# Patient Record
Sex: Female | Born: 1935
Health system: Southern US, Community
[De-identification: ages and names within clinical notes are randomized; demographics above are authoritative.]

## PROBLEM LIST (undated history)

## (undated) DIAGNOSIS — I10 Essential (primary) hypertension: Secondary | ICD-10-CM

## (undated) DIAGNOSIS — Z923 Personal history of irradiation: Secondary | ICD-10-CM

## (undated) DIAGNOSIS — R7303 Prediabetes: Secondary | ICD-10-CM

## (undated) DIAGNOSIS — Z5189 Encounter for other specified aftercare: Secondary | ICD-10-CM

## (undated) DIAGNOSIS — I499 Cardiac arrhythmia, unspecified: Secondary | ICD-10-CM

## (undated) DIAGNOSIS — C801 Malignant (primary) neoplasm, unspecified: Secondary | ICD-10-CM

## (undated) DIAGNOSIS — IMO0001 Reserved for inherently not codable concepts without codable children: Secondary | ICD-10-CM

## (undated) DIAGNOSIS — I509 Heart failure, unspecified: Secondary | ICD-10-CM

## (undated) DIAGNOSIS — M1990 Unspecified osteoarthritis, unspecified site: Secondary | ICD-10-CM

## (undated) DIAGNOSIS — N6320 Unspecified lump in the left breast, unspecified quadrant: Secondary | ICD-10-CM

## (undated) DIAGNOSIS — E039 Hypothyroidism, unspecified: Secondary | ICD-10-CM

## (undated) HISTORY — PX: CATARACT EXTRACTION, BILATERAL: SHX1313

## (undated) HISTORY — PX: ABDOMINAL SURGERY: SHX537

## (undated) HISTORY — PX: JOINT REPLACEMENT: SHX530

## (undated) HISTORY — DX: Personal history of irradiation: Z92.3

## (undated) HISTORY — PX: EYE SURGERY: SHX253

## (undated) HISTORY — PX: ABDOMINAL HYSTERECTOMY: SHX81

---

## 1998-04-11 ENCOUNTER — Ambulatory Visit (HOSPITAL_COMMUNITY): Admission: RE | Admit: 1998-04-11 | Discharge: 1998-04-11 | Payer: Self-pay | Admitting: *Deleted

## 1998-05-09 ENCOUNTER — Ambulatory Visit (HOSPITAL_COMMUNITY): Admission: RE | Admit: 1998-05-09 | Discharge: 1998-05-09 | Payer: Self-pay | Admitting: *Deleted

## 2000-07-06 ENCOUNTER — Encounter: Payer: Self-pay | Admitting: *Deleted

## 2000-07-06 ENCOUNTER — Ambulatory Visit (HOSPITAL_COMMUNITY): Admission: RE | Admit: 2000-07-06 | Discharge: 2000-07-06 | Payer: Self-pay | Admitting: *Deleted

## 2000-07-21 ENCOUNTER — Encounter: Payer: Self-pay | Admitting: *Deleted

## 2000-07-21 ENCOUNTER — Ambulatory Visit (HOSPITAL_COMMUNITY): Admission: RE | Admit: 2000-07-21 | Discharge: 2000-07-21 | Payer: Self-pay | Admitting: *Deleted

## 2001-06-28 ENCOUNTER — Encounter: Payer: Self-pay | Admitting: Internal Medicine

## 2001-06-28 ENCOUNTER — Ambulatory Visit (HOSPITAL_COMMUNITY): Admission: RE | Admit: 2001-06-28 | Discharge: 2001-06-28 | Payer: Self-pay | Admitting: Internal Medicine

## 2002-03-01 ENCOUNTER — Encounter (INDEPENDENT_AMBULATORY_CARE_PROVIDER_SITE_OTHER): Payer: Self-pay | Admitting: Specialist

## 2002-03-01 ENCOUNTER — Ambulatory Visit (HOSPITAL_COMMUNITY): Admission: RE | Admit: 2002-03-01 | Discharge: 2002-03-01 | Payer: Self-pay | Admitting: Gastroenterology

## 2004-03-13 ENCOUNTER — Ambulatory Visit (HOSPITAL_COMMUNITY): Admission: RE | Admit: 2004-03-13 | Discharge: 2004-03-13 | Payer: Self-pay | Admitting: Gastroenterology

## 2004-03-13 ENCOUNTER — Encounter (INDEPENDENT_AMBULATORY_CARE_PROVIDER_SITE_OTHER): Payer: Self-pay | Admitting: *Deleted

## 2004-10-11 ENCOUNTER — Encounter: Admission: RE | Admit: 2004-10-11 | Discharge: 2004-10-11 | Payer: Self-pay | Admitting: Internal Medicine

## 2004-10-16 ENCOUNTER — Encounter: Admission: RE | Admit: 2004-10-16 | Discharge: 2004-10-16 | Payer: Self-pay | Admitting: Internal Medicine

## 2004-11-05 ENCOUNTER — Encounter: Admission: RE | Admit: 2004-11-05 | Discharge: 2004-11-05 | Payer: Self-pay | Admitting: General Surgery

## 2005-04-25 ENCOUNTER — Encounter: Admission: RE | Admit: 2005-04-25 | Discharge: 2005-04-25 | Payer: Self-pay | Admitting: Obstetrics and Gynecology

## 2005-07-31 ENCOUNTER — Encounter: Admission: RE | Admit: 2005-07-31 | Discharge: 2005-07-31 | Payer: Self-pay | Admitting: Internal Medicine

## 2005-08-11 ENCOUNTER — Ambulatory Visit: Payer: Self-pay | Admitting: Critical Care Medicine

## 2005-11-11 ENCOUNTER — Encounter: Admission: RE | Admit: 2005-11-11 | Discharge: 2005-11-11 | Payer: Self-pay | Admitting: Internal Medicine

## 2005-12-01 ENCOUNTER — Ambulatory Visit: Payer: Self-pay | Admitting: Internal Medicine

## 2006-06-03 ENCOUNTER — Ambulatory Visit: Payer: Self-pay | Admitting: Cardiology

## 2006-07-29 ENCOUNTER — Emergency Department (HOSPITAL_COMMUNITY): Admission: EM | Admit: 2006-07-29 | Discharge: 2006-07-29 | Payer: Self-pay | Admitting: Emergency Medicine

## 2006-11-13 ENCOUNTER — Encounter: Admission: RE | Admit: 2006-11-13 | Discharge: 2006-11-13 | Payer: Self-pay | Admitting: Obstetrics and Gynecology

## 2007-06-07 ENCOUNTER — Ambulatory Visit: Payer: Self-pay | Admitting: Cardiology

## 2007-11-15 ENCOUNTER — Encounter: Admission: RE | Admit: 2007-11-15 | Discharge: 2007-11-15 | Payer: Self-pay | Admitting: Obstetrics and Gynecology

## 2008-05-11 ENCOUNTER — Encounter: Admission: RE | Admit: 2008-05-11 | Discharge: 2008-05-11 | Payer: Self-pay | Admitting: Family Medicine

## 2008-11-15 ENCOUNTER — Encounter: Admission: RE | Admit: 2008-11-15 | Discharge: 2008-11-15 | Payer: Self-pay | Admitting: Obstetrics and Gynecology

## 2008-11-27 ENCOUNTER — Encounter: Admission: RE | Admit: 2008-11-27 | Discharge: 2008-11-27 | Payer: Self-pay | Admitting: Obstetrics and Gynecology

## 2009-11-14 ENCOUNTER — Ambulatory Visit: Payer: Self-pay | Admitting: Vascular Surgery

## 2009-11-14 ENCOUNTER — Encounter (INDEPENDENT_AMBULATORY_CARE_PROVIDER_SITE_OTHER): Payer: Self-pay | Admitting: Podiatry

## 2009-11-14 ENCOUNTER — Ambulatory Visit (HOSPITAL_COMMUNITY): Admission: RE | Admit: 2009-11-14 | Discharge: 2009-11-14 | Payer: Self-pay | Admitting: Podiatry

## 2009-11-17 ENCOUNTER — Encounter: Admission: RE | Admit: 2009-11-17 | Discharge: 2009-11-17 | Payer: Self-pay | Admitting: Family Medicine

## 2009-11-21 ENCOUNTER — Encounter: Admission: RE | Admit: 2009-11-21 | Discharge: 2009-11-21 | Payer: Self-pay | Admitting: Family Medicine

## 2010-01-01 ENCOUNTER — Encounter: Admission: RE | Admit: 2010-01-01 | Discharge: 2010-01-01 | Payer: Self-pay | Admitting: Family Medicine

## 2010-07-08 ENCOUNTER — Encounter
Admission: RE | Admit: 2010-07-08 | Discharge: 2010-07-08 | Payer: Self-pay | Admitting: Physical Medicine and Rehabilitation

## 2010-11-10 ENCOUNTER — Encounter: Payer: Self-pay | Admitting: Obstetrics and Gynecology

## 2010-12-23 ENCOUNTER — Other Ambulatory Visit: Payer: Self-pay | Admitting: Obstetrics and Gynecology

## 2010-12-23 DIAGNOSIS — Z1231 Encounter for screening mammogram for malignant neoplasm of breast: Secondary | ICD-10-CM

## 2011-01-08 ENCOUNTER — Ambulatory Visit
Admission: RE | Admit: 2011-01-08 | Discharge: 2011-01-08 | Disposition: A | Discharge status: Home / Self Care | Payer: Medicare Other | Source: Ambulatory Visit | Attending: Obstetrics and Gynecology | Admitting: Obstetrics and Gynecology

## 2011-01-08 DIAGNOSIS — Z1231 Encounter for screening mammogram for malignant neoplasm of breast: Secondary | ICD-10-CM

## 2011-01-22 ENCOUNTER — Other Ambulatory Visit (HOSPITAL_COMMUNITY): Payer: Medicare Other

## 2011-01-27 ENCOUNTER — Encounter (HOSPITAL_COMMUNITY)
Admission: RE | Admit: 2011-01-27 | Discharge: 2011-01-27 | Disposition: A | Discharge status: Home / Self Care | Payer: Medicare Other | Source: Ambulatory Visit | Attending: Orthopedic Surgery | Admitting: Orthopedic Surgery

## 2011-01-27 ENCOUNTER — Other Ambulatory Visit: Payer: Self-pay | Admitting: Orthopedic Surgery

## 2011-01-27 ENCOUNTER — Ambulatory Visit (HOSPITAL_COMMUNITY)
Admission: RE | Admit: 2011-01-27 | Discharge: 2011-01-27 | Disposition: A | Discharge status: Home / Self Care | Payer: Medicare Other | Source: Ambulatory Visit | Attending: Orthopedic Surgery | Admitting: Orthopedic Surgery

## 2011-01-27 DIAGNOSIS — M1710 Unilateral primary osteoarthritis, unspecified knee: Secondary | ICD-10-CM | POA: Insufficient documentation

## 2011-01-27 DIAGNOSIS — Z01818 Encounter for other preprocedural examination: Secondary | ICD-10-CM | POA: Insufficient documentation

## 2011-01-27 DIAGNOSIS — M25562 Pain in left knee: Secondary | ICD-10-CM

## 2011-01-27 DIAGNOSIS — Z01812 Encounter for preprocedural laboratory examination: Secondary | ICD-10-CM | POA: Insufficient documentation

## 2011-01-27 DIAGNOSIS — M1712 Unilateral primary osteoarthritis, left knee: Secondary | ICD-10-CM

## 2011-01-27 DIAGNOSIS — IMO0002 Reserved for concepts with insufficient information to code with codable children: Secondary | ICD-10-CM | POA: Insufficient documentation

## 2011-01-27 DIAGNOSIS — J45909 Unspecified asthma, uncomplicated: Secondary | ICD-10-CM | POA: Insufficient documentation

## 2011-01-27 DIAGNOSIS — Z01810 Encounter for preprocedural cardiovascular examination: Secondary | ICD-10-CM | POA: Insufficient documentation

## 2011-01-27 DIAGNOSIS — I517 Cardiomegaly: Secondary | ICD-10-CM | POA: Insufficient documentation

## 2011-01-27 LAB — CBC
HCT: 39.5 % (ref 36.0–46.0)
Hemoglobin: 12.8 g/dL (ref 12.0–15.0)
MCH: 30.3 pg (ref 26.0–34.0)
MCHC: 32.4 g/dL (ref 30.0–36.0)
MCV: 93.4 fL (ref 78.0–100.0)
Platelets: 322 10*3/uL (ref 150–400)
RBC: 4.23 MIL/uL (ref 3.87–5.11)
RDW: 13.4 % (ref 11.5–15.5)
WBC: 10.8 10*3/uL — ABNORMAL HIGH (ref 4.0–10.5)

## 2011-01-27 LAB — URINALYSIS, ROUTINE W REFLEX MICROSCOPIC
Bilirubin Urine: NEGATIVE
Glucose, UA: NEGATIVE mg/dL
Hgb urine dipstick: NEGATIVE
Ketones, ur: NEGATIVE mg/dL
Nitrite: NEGATIVE
Protein, ur: NEGATIVE mg/dL
Specific Gravity, Urine: 1.01 (ref 1.005–1.030)
Urobilinogen, UA: 0.2 mg/dL (ref 0.0–1.0)
pH: 7.5 (ref 5.0–8.0)

## 2011-01-27 LAB — DIFFERENTIAL
Basophils Absolute: 0.1 10*3/uL (ref 0.0–0.1)
Basophils Relative: 1 % (ref 0–1)
Eosinophils Absolute: 0.7 10*3/uL (ref 0.0–0.7)
Eosinophils Relative: 6 % — ABNORMAL HIGH (ref 0–5)
Lymphocytes Relative: 20 % (ref 12–46)
Lymphs Abs: 2.2 10*3/uL (ref 0.7–4.0)
Monocytes Absolute: 0.9 10*3/uL (ref 0.1–1.0)
Monocytes Relative: 8 % (ref 3–12)
Neutro Abs: 7 10*3/uL (ref 1.7–7.7)
Neutrophils Relative %: 65 % (ref 43–77)

## 2011-01-27 LAB — TYPE AND SCREEN
ABO/RH(D): O POS
Antibody Screen: NEGATIVE

## 2011-01-27 LAB — BASIC METABOLIC PANEL
BUN: 13 mg/dL (ref 6–23)
CO2: 28 mEq/L (ref 19–32)
Calcium: 9.5 mg/dL (ref 8.4–10.5)
Chloride: 104 mEq/L (ref 96–112)
Creatinine, Ser: 0.77 mg/dL (ref 0.4–1.2)
GFR calc Af Amer: >60 mL/min (ref >60)
GFR calc non Af Amer: >60 mL/min (ref >60)
Glucose, Bld: 99 mg/dL (ref 70–99)
Potassium: 4 mEq/L (ref 3.5–5.1)
Sodium: 142 mEq/L (ref 135–145)

## 2011-01-27 LAB — ABO/RH: ABO/RH(D): O POS

## 2011-01-27 LAB — SURGICAL PCR SCREEN
MRSA, PCR: NEGATIVE
Staphylococcus aureus: NEGATIVE

## 2011-01-27 LAB — PROTIME-INR
INR: 0.97 (ref 0.00–1.49)
Prothrombin Time: 13.1 seconds (ref 11.6–15.2)

## 2011-01-28 ENCOUNTER — Inpatient Hospital Stay (HOSPITAL_COMMUNITY)
Admission: RE | Admit: 2011-01-28 | Discharge: 2011-02-01 | DRG: 470 | Disposition: A | Discharge status: Home Health | Payer: Medicare Other | Source: Ambulatory Visit | Attending: Orthopedic Surgery | Admitting: Orthopedic Surgery

## 2011-01-28 ENCOUNTER — Inpatient Hospital Stay (HOSPITAL_COMMUNITY): Payer: Medicare Other

## 2011-01-28 DIAGNOSIS — J45909 Unspecified asthma, uncomplicated: Secondary | ICD-10-CM | POA: Diagnosis present

## 2011-01-28 DIAGNOSIS — I10 Essential (primary) hypertension: Secondary | ICD-10-CM | POA: Diagnosis present

## 2011-01-28 DIAGNOSIS — Z880 Allergy status to penicillin: Secondary | ICD-10-CM

## 2011-01-28 DIAGNOSIS — Z7982 Long term (current) use of aspirin: Secondary | ICD-10-CM

## 2011-01-28 DIAGNOSIS — Z7901 Long term (current) use of anticoagulants: Secondary | ICD-10-CM

## 2011-01-28 DIAGNOSIS — Z882 Allergy status to sulfonamides status: Secondary | ICD-10-CM

## 2011-01-28 DIAGNOSIS — M1710 Unilateral primary osteoarthritis, unspecified knee: Principal | ICD-10-CM | POA: Diagnosis present

## 2011-01-28 LAB — URINALYSIS, DIPSTICK ONLY
Bilirubin Urine: NEGATIVE
Glucose, UA: NEGATIVE mg/dL
Hgb urine dipstick: NEGATIVE
Ketones, ur: NEGATIVE mg/dL
Leukocytes, UA: NEGATIVE
Nitrite: NEGATIVE
Protein, ur: NEGATIVE mg/dL
Specific Gravity, Urine: 1.029 (ref 1.005–1.030)
Urobilinogen, UA: 0.2 mg/dL (ref 0.0–1.0)
pH: 5 (ref 5.0–8.0)

## 2011-01-29 LAB — CBC
HCT: 29.6 % — ABNORMAL LOW (ref 36.0–46.0)
Hemoglobin: 9.5 g/dL — ABNORMAL LOW (ref 12.0–15.0)
MCH: 30.2 pg (ref 26.0–34.0)
MCHC: 32.1 g/dL (ref 30.0–36.0)
MCV: 94 fL (ref 78.0–100.0)
Platelets: 248 10*3/uL (ref 150–400)
RBC: 3.15 MIL/uL — ABNORMAL LOW (ref 3.87–5.11)
RDW: 13.6 % (ref 11.5–15.5)
WBC: 11.1 10*3/uL — ABNORMAL HIGH (ref 4.0–10.5)

## 2011-01-29 LAB — URINE CULTURE
Colony Count: NO GROWTH
Culture  Setup Time: 201204101617
Culture: NO GROWTH
Special Requests: NEGATIVE

## 2011-01-29 LAB — PROTIME-INR
INR: 1.05 (ref 0.00–1.49)
Prothrombin Time: 13.9 seconds (ref 11.6–15.2)

## 2011-01-29 LAB — BASIC METABOLIC PANEL
BUN: 9 mg/dL (ref 6–23)
CO2: 27 mEq/L (ref 19–32)
Calcium: 8.3 mg/dL — ABNORMAL LOW (ref 8.4–10.5)
Chloride: 107 mEq/L (ref 96–112)
Creatinine, Ser: 0.7 mg/dL (ref 0.4–1.2)
GFR calc Af Amer: >60 mL/min (ref >60)
GFR calc non Af Amer: >60 mL/min (ref >60)
Glucose, Bld: 113 mg/dL — ABNORMAL HIGH (ref 70–99)
Potassium: 4.1 mEq/L (ref 3.5–5.1)
Sodium: 142 mEq/L (ref 135–145)

## 2011-01-30 LAB — BASIC METABOLIC PANEL
BUN: 6 mg/dL (ref 6–23)
CO2: 26 mEq/L (ref 19–32)
Calcium: 8.2 mg/dL — ABNORMAL LOW (ref 8.4–10.5)
Chloride: 105 mEq/L (ref 96–112)
Creatinine, Ser: 0.68 mg/dL (ref 0.4–1.2)
GFR calc Af Amer: >60 mL/min (ref >60)
GFR calc non Af Amer: >60 mL/min (ref >60)
Glucose, Bld: 108 mg/dL — ABNORMAL HIGH (ref 70–99)
Potassium: 3.4 mEq/L — ABNORMAL LOW (ref 3.5–5.1)
Sodium: 140 mEq/L (ref 135–145)

## 2011-01-30 LAB — CBC
HCT: 28.1 % — ABNORMAL LOW (ref 36.0–46.0)
Hemoglobin: 9.2 g/dL — ABNORMAL LOW (ref 12.0–15.0)
MCH: 30.4 pg (ref 26.0–34.0)
MCHC: 32.7 g/dL (ref 30.0–36.0)
MCV: 92.7 fL (ref 78.0–100.0)
Platelets: 237 10*3/uL (ref 150–400)
RBC: 3.03 MIL/uL — ABNORMAL LOW (ref 3.87–5.11)
RDW: 13.4 % (ref 11.5–15.5)
WBC: 13.2 10*3/uL — ABNORMAL HIGH (ref 4.0–10.5)

## 2011-01-30 LAB — PROTIME-INR
INR: 1.46 (ref 0.00–1.49)
Prothrombin Time: 17.9 seconds — ABNORMAL HIGH (ref 11.6–15.2)

## 2011-01-30 NOTE — Op Note (Signed)
Alisha [name], Alisha [name]              ACCOUNT NO.:  [id number]  MEDICAL RECORD NO.:  [id number]           PATIENT TYPE:  I  LOCATION:  5002                         FACILITY:  [facility]  PHYSICIAN:  Burnard Bunting, M.D.    DATE OF BIRTH:  09-12-1935  DATE OF PROCEDURE:  01/28/2011 DATE OF DISCHARGE:                              OPERATIVE REPORT   PREOPERATIVE DIAGNOSIS:  Left knee arthritis.  POSTOPERATIVE DIAGNOSIS:  Left knee arthritis.  PROCEDURE:  Left total knee replacement.  SURGEON:  Burnard Bunting, MD  ASSISTANT:  Wende Neighbors, PA  ANESTHESIA:  General endotracheal.  ESTIMATED BLOOD LOSS:  100 mL.  DRAINS:  Hemovac x1.  INDICATIONS:  Alisha [name] is a 76 year old patient with left knee arthritis presents for operative management for explanation of risks and benefits.  COMPONENTS INSERTED:  DePuy rotating platform 2.5 tibia, 3 femur, 12.5 poly, 35 true dome patella with posterior cruciate sacrificing instrumentation utilized, as well as the computer systems utilized.  PROCEDURE IN DETAIL:  The patient was brought to the operating room where general endotracheal anesthesia was induced.  Preoperative antibiotics were administered.  Left knee was prescribed with alcohol and Betadine, , which allowed to air dry and then prepped with DuraPrep solution and draped in sterile manner.  Alisha [name] was used to cover the operative field.  Time-out was called.  Leg was elevated and Esmarch wrap.  Tourniquet was inflated.  Anterior approach to the knee was utilized.  Skin and subcutaneous tissue were sharply divided.  Median parapatellar approach to the knee was used.  Precise location of the arthrotomy was marked with #1 Vicryl suture.  Patella was everted. Osteophytes were removed.  ACL, PCL were released.  Soft tissue from the anterior distal femur was removed.  Fat pad was partially excised. Lateral patellofemoral ligament was released.  Two pins were placed in the distal medial  femur, proximal medial tibia.  Registration points for computer assistance were obtained including the bimalleolar axis, and hip center, rotation, and various points around the knee.  Tibia was then cut with the collaterals and posterior structures well protected. At this cut, perpendicular mechanical axis, tensioning device was then placed in extension and flexion.  Distal cut was then made, and then extension was checked with some extension spacer block with a 10-mm equivalent spacer block.  The knee did achieve full extension in neutral alignment.  At this time, the chamfer and posterior cut, anterior cuts were made along with the box cut.  Tibia was then keel punched and sized to 2.5.  At this time, trial components were placed.  Patella was then cut from 25-14 and a 35-mm 3-peg patella was placed.  The trial components in position including 12.5 spacer, the patient achieved 0 degrees of extension, full flexion with excellent patellar tracking with no-thumbs technique, had good stability to varus and valgus stress at 0, 30, and 90 degrees, and that was with a 12.5 spacer.  Trial components were removed.  3 liters of irrigating solution were used to irrigate the knee incision.  The true components were then cemented into position. Excess cement was removed.  Same stability and alignment parameters were maintained.  Tourniquet was released.  Bleeding points encountered and controlled using electrocautery.  Thorough irrigation was again performed.  Hemovac drain was placed.  Median parapatellar arthrotomy was then closed with the knee over a bolster using #1 Vicryl suture, 0 Vicryl suture, 2-0 Vicryl suture, and staples.  A 3-0 nylon was used to close the incisions for the pin sites, which the pins were removed. Solution of Marcaine, morphine, and clonidine was injected into the knee.  The patient tolerated the procedure well without immediate complications.  Bulky wrap was applied.   Alisha [name]'s assistance was required all times during the case for opening, closing, retraction of important neurovascular structures removing excess cement.  Her assistance was a medical necessity.     Burnard Bunting, M.D.     GSD/MEDQ  D:  01/28/2011  T:  01/29/2011  Job:  [id number]  Electronically Signed by Reece Agar.  DEAN M.D. on 01/30/2011 05:37:18 PM

## 2011-01-31 LAB — CBC
HCT: 27.6 % — ABNORMAL LOW (ref 36.0–46.0)
Hemoglobin: 9 g/dL — ABNORMAL LOW (ref 12.0–15.0)
MCH: 30.3 pg (ref 26.0–34.0)
MCHC: 32.6 g/dL (ref 30.0–36.0)
MCV: 92.9 fL (ref 78.0–100.0)
Platelets: 256 10*3/uL (ref 150–400)
RBC: 2.97 MIL/uL — ABNORMAL LOW (ref 3.87–5.11)
RDW: 13.4 % (ref 11.5–15.5)
WBC: 13.6 10*3/uL — ABNORMAL HIGH (ref 4.0–10.5)

## 2011-01-31 LAB — PROTIME-INR
INR: 1.51 — ABNORMAL HIGH (ref 0.00–1.49)
Prothrombin Time: 18.4 seconds — ABNORMAL HIGH (ref 11.6–15.2)

## 2011-02-01 LAB — PROTIME-INR
INR: 1.91 — ABNORMAL HIGH (ref 0.00–1.49)
Prothrombin Time: 22 seconds — ABNORMAL HIGH (ref 11.6–15.2)

## 2011-02-25 NOTE — Discharge Summary (Signed)
  [name], Alisha [name]              ACCOUNT NO.:  [id number]  MEDICAL RECORD NO.:  [id number]           PATIENT TYPE:  I  LOCATION:  5002                         FACILITY:  [facility]  PHYSICIAN:  Burnard Bunting, M.D.    DATE OF BIRTH:  1935/05/09  DATE OF ADMISSION:  01/28/2011 DATE OF DISCHARGE:  02/01/2011                              DISCHARGE SUMMARY   DISCHARGE DIAGNOSIS:  Left knee arthritis.  SECONDARY DIAGNOSES: 1. Asthma. 2. Hypertension.  OPERATIONS AND NOTABLE PROCEDURES:  Left total knee replacement, performed on January 28, 2011.  HOSPITAL COURSE:  [name] is a patient with left knee arthritis.  She underwent total knee replacement on January 28, 2011.  She did well, was started on Coumadin for DVT prophylaxis, physical therapy, and CPM for range of motion.  Hemoglobin was 9.5 on postop day #1.  She made good progress with therapy.  Incision was intact on postop day #3 when the dressing was changed.  She was discharged home in good condition on postop day #4.  INR is near therapeutic on January 31, 2011, at 1.51.  She was discharged in good condition.  She will follow up with me in 7 days for suture removal.  DISCHARGE MEDICATIONS: 1. Robaxin 500 mg p.o. q.6-8 h. as needed for spasm. 2. Percocet 5/325 one to two p.o. q.6 h. as needed for pain. 3. Senna tablet one tablet by mouth twice daily before meals. 4. Coumadin 5 mg p.o. daily to INR of 2-2.5. 5. Advair Diskus 1 puff inhaled daily. 6. Aspirin enteric coated 81 mg p.o. daily. 7. Bumetanide 1 mg one to two tablets by mouth twice daily. 8. Gabapentin 300 mg by mouth daily. 9. Multivitamins. 10.Nitrofurantoin macrocrystal 50 mg by mouth daily one capsule. 11.ProAir albuterol inhaler 2 puffs inhaled every 4 hours as needed. 12.Synthroid 50 mcg one to two tablets by mouth daily. 13.Travatan ophthalmic 1 drop in both eyes daily at bedtime.  She is discharged with home health care for PT as well as blood draws for  Coumadin.     Burnard Bunting, M.D.     GSD/MEDQ  D:  01/31/2011  T:  02/01/2011  Job:  [id number]  Electronically Signed by Reece Agar.  DEAN M.D. on 02/25/2011 01:28:16 PM

## 2011-03-07 NOTE — Op Note (Signed)
NAME:  Alisha [name], Alisha [name]                        ACCOUNT NO.:  [id number]   MEDICAL RECORD NO.:  [id number]                   PATIENT TYPE:  AMB   LOCATION:  ENDO                                 FACILITY:  [facility]   PHYSICIAN:  Anselmo Rod, M.D.               DATE OF BIRTH:  Dec 08, 1934   DATE OF PROCEDURE:  03/13/2004  DATE OF DISCHARGE:                                 OPERATIVE REPORT   PROCEDURE:  Colonoscopy with snare polypectomy x 1.   ENDOSCOPIST:  Charna Elizabeth, M.D.   INSTRUMENT USED:  Olympus video colonoscope.   INDICATIONS FOR PROCEDURE:  75 year old African American female with a  family history rectal bleeding undergoing screening colonoscopy to rule out  colonic polyps, masses, etc.   PREPROCEDURE PREPARATION:  Informed consent was obtained from the patient.  The patient was fasted for eight hours prior to the procedure and prepped  with a bottle of magnesium citrate and a gallon of GoLYTELY the night prior  to the procedure.   PREPROCEDURE PHYSICAL:  Patient with stable vital signs.  Neck supple.  Chest clear to auscultation.  S1 and S2 regular.  Abdomen soft with normal  bowel sounds.   DESCRIPTION OF PROCEDURE:  The patient was placed in the left lateral  decubitus position, sedated with 50 mg of Demerol and 6 mg Versed  intravenously.  Once the patient was adequately sedated, maintained on low  flow oxygen and continuous cardiac monitoring, the Olympus video colonoscope  was advanced into the rectum to the cecum without difficulty.  The  appendiceal orifice and ileocecal valve were clearly visualized and  photographed.  A small sessile polyp was snared from the proximal right  colon.  Small internal hemorrhoids were seen on retroflexion.  The rest of  the exam was unrevealing.  The patient tolerated the procedure well without  complications.   IMPRESSION:  Normal colonoscopy to the cecum except for small internal  hemorrhoids and a small sessile polyp snared  from the proximal right colon.   RECOMMENDATIONS:  1. Await pathology results.  2. Repeat CRC screening depending on pathology results.  3. Avoid nonsteroidals including aspirin for the next two weeks.  4. Outpatient follow up in the next two weeks or earlier if need be.                                               Anselmo Rod, M.D.    JNM/MEDQ  D:  03/13/2004  T:  03/13/2004  Job:  [id number]   cc:   Wilson Singer, M.D.  [address]  [place]  [place] [address]  Fax: [phone]

## 2011-03-07 NOTE — Op Note (Signed)
[facility]  Patient:    Alisha [name], Alisha [name] Visit Number: [id number] MRN: [id number]          Service Type: END Location: ENDO Attending Physician:  Charna Elizabeth Dictated by:   Anselmo Rod, M.D. Proc. Date: 03/02/02 Admit Date:  03/01/2002 Discharge Date: 03/01/2002   CC:         Lilly Cove, M.D.   Operative Report  DATE OF BIRTH:  1935/06/13  REFERRING PHYSICIAN:  Lilly Cove, M.D.  PROCEDURE PERFORMED:  Colonoscopy with hot biopsies times two and cold biopsies times six.  ENDOSCOPIST:  Anselmo Rod, M.D.  INSTRUMENT USED:  Olympus pediatric adjustable colonoscope.  INDICATIONS FOR PROCEDURE:  The patient is a 76 year old female undergoing screening colonoscopy.  The patient has had guaiac positive stools in the recent past.  PREPROCEDURE PREPARATION:  Informed consent was procured from the patient. The patient was fasted for eight hours prior to the procedure and prepped with a bottle of magnesium citrate and a gallon of NuLytely the night prior to the procedure.  PREPROCEDURE PHYSICAL:  The patient had stable vital signs.  Neck supple. Chest clear to auscultation.  S1, S2 regular.  Abdomen soft with normal bowel sounds.  DESCRIPTION OF PROCEDURE:  The patient was placed in the left lateral decubitus position and sedated with 30 mg of Demerol and 4 mg of Versed intravenously.  Once the patient was adequately sedated and maintained on low-flow oxygen and continuous cardiac monitoring, the Olympus video colonoscope was advanced from the rectum to the cecum without difficulty.  An isolated diverticulum was seen in the transverse colon.  A small polyp was hot biopsied times two from the proximal right colon.  There was erythematous fold at the hepatic flexure that was biopsied for pathology as well.  The exact nature of this lesion is unclear to me by visual inspection.  The rest of the transverse colon and right  colon appeared healthy.  The appendicular orifice and the ileocecal valve were clearly visualized and photographed,  IMPRESSION: 1. One small polyp hot biopsied from right colon. 2. Erythematous fold biopsied from the hepatic flexure. 3. Isolated diverticulum in the transverse colon.  RECOMMENDATIONS: 1. Await pathology results. 2. Avoid all nonsteroidals including aspirin for the next four weeks. 3. Outpatient follow-up for the next two weeks for further recommendations. Dictated by:   Anselmo Rod, M.D. Attending Physician:  Charna Elizabeth DD:  03/02/02 TD:  03/03/02 Job: [id number]

## 2011-03-07 NOTE — Assessment & Plan Note (Signed)
[facility]                             PULMONARY OFFICE NOTE   NAME:Kross, SASHA ROGEL                     MRN:          [id number]  DATE:06/08/2007                            DOB:          03/27/1935    Ms. Arca's CT scan was performed today showing no change in pulmonary  nodules.  These are likely benign nodules.  Therefore no further CT  scanning is indicated.     Charlcie Cradle Delford Field, MD, [facility]  Electronically Signed    PEW/MedQ  DD: 06/08/2007  DT: 06/09/2007  Job #: [id number]

## 2011-03-15 ENCOUNTER — Emergency Department (HOSPITAL_COMMUNITY)
Admission: EM | Admit: 2011-03-15 | Discharge: 2011-03-16 | Disposition: A | Discharge status: Home / Self Care | Payer: Medicare Other | Attending: Emergency Medicine | Admitting: Emergency Medicine

## 2011-03-15 DIAGNOSIS — R5381 Other malaise: Secondary | ICD-10-CM | POA: Insufficient documentation

## 2011-03-15 DIAGNOSIS — R197 Diarrhea, unspecified: Secondary | ICD-10-CM | POA: Insufficient documentation

## 2011-03-15 DIAGNOSIS — R51 Headache: Secondary | ICD-10-CM | POA: Insufficient documentation

## 2011-03-15 DIAGNOSIS — E876 Hypokalemia: Secondary | ICD-10-CM | POA: Insufficient documentation

## 2011-03-15 DIAGNOSIS — N390 Urinary tract infection, site not specified: Secondary | ICD-10-CM | POA: Insufficient documentation

## 2011-03-15 DIAGNOSIS — R5383 Other fatigue: Secondary | ICD-10-CM | POA: Insufficient documentation

## 2011-03-15 LAB — CBC
HCT: 34.7 % — ABNORMAL LOW (ref 36.0–46.0)
Hemoglobin: 11.3 g/dL — ABNORMAL LOW (ref 12.0–15.0)
MCH: 30 pg (ref 26.0–34.0)
MCHC: 32.6 g/dL (ref 30.0–36.0)
MCV: 92 fL (ref 78.0–100.0)
Platelets: 379 10*3/uL (ref 150–400)
RBC: 3.77 MIL/uL — ABNORMAL LOW (ref 3.87–5.11)
RDW: 13.7 % (ref 11.5–15.5)
WBC: 11.5 10*3/uL — ABNORMAL HIGH (ref 4.0–10.5)

## 2011-03-15 LAB — DIFFERENTIAL
Basophils Absolute: 0.1 10*3/uL (ref 0.0–0.1)
Basophils Relative: 0 % (ref 0–1)
Eosinophils Absolute: 0.2 10*3/uL (ref 0.0–0.7)
Eosinophils Relative: 2 % (ref 0–5)
Lymphocytes Relative: 23 % (ref 12–46)
Lymphs Abs: 2.6 10*3/uL (ref 0.7–4.0)
Monocytes Absolute: 1.2 10*3/uL — ABNORMAL HIGH (ref 0.1–1.0)
Monocytes Relative: 10 % (ref 3–12)
Neutro Abs: 7.5 10*3/uL (ref 1.7–7.7)
Neutrophils Relative %: 65 % (ref 43–77)

## 2011-03-15 LAB — POCT I-STAT, CHEM 8
BUN: 8 mg/dL (ref 6–23)
Calcium, Ion: 1.1 mmol/L — ABNORMAL LOW (ref 1.12–1.32)
Chloride: 102 mEq/L (ref 96–112)
Creatinine, Ser: 0.8 mg/dL (ref 0.4–1.2)
Glucose, Bld: 97 mg/dL (ref 70–99)
HCT: 35 % — ABNORMAL LOW (ref 36.0–46.0)
Hemoglobin: 11.9 g/dL — ABNORMAL LOW (ref 12.0–15.0)
Potassium: 3.3 mEq/L — ABNORMAL LOW (ref 3.5–5.1)
Sodium: 139 mEq/L (ref 135–145)
TCO2: 25 mmol/L (ref 0–100)

## 2011-03-16 ENCOUNTER — Emergency Department (HOSPITAL_COMMUNITY): Payer: Medicare Other

## 2011-03-16 LAB — URINALYSIS, ROUTINE W REFLEX MICROSCOPIC
Bilirubin Urine: NEGATIVE
Glucose, UA: NEGATIVE mg/dL
Hgb urine dipstick: NEGATIVE
Ketones, ur: NEGATIVE mg/dL
Nitrite: NEGATIVE
Protein, ur: NEGATIVE mg/dL
Specific Gravity, Urine: 1.017 (ref 1.005–1.030)
Urobilinogen, UA: 0.2 mg/dL (ref 0.0–1.0)
pH: 7 (ref 5.0–8.0)

## 2011-03-16 LAB — URINE MICROSCOPIC-ADD ON

## 2011-12-04 ENCOUNTER — Other Ambulatory Visit: Payer: Self-pay | Admitting: Obstetrics and Gynecology

## 2011-12-04 DIAGNOSIS — Z1231 Encounter for screening mammogram for malignant neoplasm of breast: Secondary | ICD-10-CM

## 2012-01-09 ENCOUNTER — Ambulatory Visit
Admission: RE | Admit: 2012-01-09 | Discharge: 2012-01-09 | Disposition: A | Discharge status: Home / Self Care | Payer: Medicare Other | Source: Ambulatory Visit | Attending: Obstetrics and Gynecology | Admitting: Obstetrics and Gynecology

## 2012-01-09 DIAGNOSIS — Z1231 Encounter for screening mammogram for malignant neoplasm of breast: Secondary | ICD-10-CM

## 2012-01-29 ENCOUNTER — Other Ambulatory Visit (HOSPITAL_COMMUNITY): Payer: Self-pay | Admitting: Orthopedic Surgery

## 2012-02-02 ENCOUNTER — Encounter (HOSPITAL_COMMUNITY): Payer: Self-pay | Admitting: Pharmacy Technician

## 2012-02-04 NOTE — Pre-Procedure Instructions (Signed)
20 Alisha [name]  02/04/2012   Your procedure is scheduled on:  Tuesday, April 30th.  Report to [facility] at 5:30 AM.  Call this number if you have problems the morning of surgery: [phone]   Remember:   Do not eat food:After Midnight.  May have clear liquids: up to 4 Hours before arrival. (1:30am)  Clear liquids include soda, tea, black coffee, apple or grape juice, broth .  Take these medicines the morning of surgery with A SIP OF WATER: Levothyroxine (Synthyroid), Gabapentin.  MAy [place] eye drops. (Neurotin   Do not wear jewelry, make-up or nail polish.  Do not wear lotions, powders, or perfumes. You may wear deodorant.  Do not shave 48 hours prior to surgery.  Do not bring valuables to the hospital.  Contacts, dentures or bridgework may not be worn into surgery.  Leave suitcase in the car. After surgery it may be brought to your room.  For patients admitted to the hospital, checkout time is 11:00 AM the day of discharge.   Patients discharged the day of surgery will not be allowed to drive home.  Name and phone number of your driver:   Special Instructions: CHG Shower Use Special Wash: 1/2 bottle night before surgery and 1/2 bottle morning of surgery.   Please read over the following fact sheets that you were given: Pain Booklet, Coughing and Deep Breathing, Blood Transfusion Information, MRSA Information and Surgical Site Infection Prevention

## 2012-02-05 ENCOUNTER — Encounter (HOSPITAL_COMMUNITY)
Admission: RE | Admit: 2012-02-05 | Discharge: 2012-02-05 | Disposition: A | Discharge status: Home / Self Care | Payer: Medicare Other | Source: Ambulatory Visit | Attending: Orthopedic Surgery | Admitting: Orthopedic Surgery

## 2012-02-05 ENCOUNTER — Encounter (HOSPITAL_COMMUNITY): Payer: Self-pay

## 2012-02-05 HISTORY — DX: Hypothyroidism, unspecified: E03.9

## 2012-02-05 HISTORY — DX: Reserved for inherently not codable concepts without codable children: IMO0001

## 2012-02-05 HISTORY — DX: Encounter for other specified aftercare: Z51.89

## 2012-02-05 HISTORY — DX: Unspecified osteoarthritis, unspecified site: M19.90

## 2012-02-05 HISTORY — DX: Essential (primary) hypertension: I10

## 2012-02-05 LAB — CBC
HCT: 40 % (ref 36.0–46.0)
Hemoglobin: 13.3 g/dL (ref 12.0–15.0)
MCH: 31.4 pg (ref 26.0–34.0)
MCHC: 33.3 g/dL (ref 30.0–36.0)
MCV: 94.3 fL (ref 78.0–100.0)
Platelets: 309 10*3/uL (ref 150–400)
RBC: 4.24 MIL/uL (ref 3.87–5.11)
RDW: 13 % (ref 11.5–15.5)
WBC: 11.3 10*3/uL — ABNORMAL HIGH (ref 4.0–10.5)

## 2012-02-05 LAB — BASIC METABOLIC PANEL
BUN: 15 mg/dL (ref 6–23)
CO2: 25 mEq/L (ref 19–32)
Calcium: 9.5 mg/dL (ref 8.4–10.5)
Chloride: 105 mEq/L (ref 96–112)
Creatinine, Ser: 0.62 mg/dL (ref 0.50–1.10)
GFR calc Af Amer: >90 mL/min (ref >90)
GFR calc non Af Amer: 86 mL/min — ABNORMAL LOW (ref >90)
Glucose, Bld: 112 mg/dL — ABNORMAL HIGH (ref 70–99)
Potassium: 3.8 mEq/L (ref 3.5–5.1)
Sodium: 141 mEq/L (ref 135–145)

## 2012-02-05 LAB — SURGICAL PCR SCREEN
MRSA, PCR: NEGATIVE
Staphylococcus aureus: NEGATIVE

## 2012-02-05 LAB — URINALYSIS, ROUTINE W REFLEX MICROSCOPIC
Bilirubin Urine: NEGATIVE
Glucose, UA: NEGATIVE mg/dL
Hgb urine dipstick: NEGATIVE
Ketones, ur: NEGATIVE mg/dL
Leukocytes, UA: NEGATIVE
Nitrite: NEGATIVE
Protein, ur: NEGATIVE mg/dL
Specific Gravity, Urine: 1.022 (ref 1.005–1.030)
Urobilinogen, UA: 0.2 mg/dL (ref 0.0–1.0)
pH: 5.5 (ref 5.0–8.0)

## 2012-02-05 LAB — TYPE AND SCREEN
ABO/RH(D): O POS
Antibody Screen: NEGATIVE

## 2012-02-05 MED ORDER — CHLORHEXIDINE GLUCONATE 4 % EX LIQD: 60.0000 mL | Freq: Once | CUTANEOUS | Status: DC

## 2012-02-06 LAB — URINE CULTURE
Colony Count: NO GROWTH
Culture  Setup Time: 201304181234
Culture: NO GROWTH

## 2012-02-16 MED ORDER — CLINDAMYCIN PHOSPHATE 600 MG/50ML IV SOLN
600.0000 mg | INTRAVENOUS | Status: AC
  Administered 2012-02-17: 600 mg via INTRAVENOUS
  Filled 2012-02-16: qty 50

## 2012-02-16 NOTE — H&P (Signed)
#  [id number] 

## 2012-02-16 NOTE — Progress Notes (Signed)
Patient notified of surgery time change to 7:30am.  New arrival time at 5:30am.

## 2012-02-17 ENCOUNTER — Encounter (HOSPITAL_COMMUNITY): Payer: Self-pay | Admitting: Certified Registered"

## 2012-02-17 ENCOUNTER — Inpatient Hospital Stay (HOSPITAL_COMMUNITY)
Admission: RE | Admit: 2012-02-17 | Discharge: 2012-02-21 | DRG: 470 | Disposition: A | Discharge status: Home Health | Payer: Medicare Other | Source: Ambulatory Visit | Attending: Orthopedic Surgery | Admitting: Orthopedic Surgery

## 2012-02-17 ENCOUNTER — Encounter (HOSPITAL_COMMUNITY): Payer: Self-pay | Admitting: *Deleted

## 2012-02-17 ENCOUNTER — Inpatient Hospital Stay (HOSPITAL_COMMUNITY): Payer: Medicare Other

## 2012-02-17 ENCOUNTER — Ambulatory Visit (HOSPITAL_COMMUNITY): Payer: Medicare Other | Admitting: Certified Registered"

## 2012-02-17 ENCOUNTER — Encounter (HOSPITAL_COMMUNITY)
Admission: RE | Disposition: A | Discharge status: Home Health | Payer: Self-pay | Source: Ambulatory Visit | Attending: Orthopedic Surgery

## 2012-02-17 DIAGNOSIS — M12869 Other specific arthropathies, not elsewhere classified, unspecified knee: Principal | ICD-10-CM | POA: Diagnosis present

## 2012-02-17 DIAGNOSIS — M1710 Unilateral primary osteoarthritis, unspecified knee: Secondary | ICD-10-CM

## 2012-02-17 DIAGNOSIS — I10 Essential (primary) hypertension: Secondary | ICD-10-CM | POA: Diagnosis present

## 2012-02-17 DIAGNOSIS — Z882 Allergy status to sulfonamides status: Secondary | ICD-10-CM

## 2012-02-17 DIAGNOSIS — Z79899 Other long term (current) drug therapy: Secondary | ICD-10-CM

## 2012-02-17 DIAGNOSIS — E119 Type 2 diabetes mellitus without complications: Secondary | ICD-10-CM | POA: Diagnosis present

## 2012-02-17 DIAGNOSIS — Z880 Allergy status to penicillin: Secondary | ICD-10-CM

## 2012-02-17 HISTORY — PX: KNEE ARTHROPLASTY: SHX992

## 2012-02-17 LAB — PROTIME-INR
INR: 1 (ref 0.00–1.49)
Prothrombin Time: 13.4 seconds (ref 11.6–15.2)

## 2012-02-17 SURGERY — ARTHROPLASTY, KNEE, TOTAL, USING IMAGELESS COMPUTER-ASSISTED NAVIGATION
Anesthesia: General | Site: Knee | Laterality: Right | Wound class: Clean

## 2012-02-17 MED ORDER — HYDROMORPHONE HCL PF 1 MG/ML IJ SOLN
0.2500 mg | INTRAMUSCULAR | Status: DC | PRN
  Administered 2012-02-17 (×2): 0.5 mg via INTRAVENOUS

## 2012-02-17 MED ORDER — ONDANSETRON HCL 4 MG/2ML IJ SOLN: 4.0000 mg | Freq: Four times a day (QID) | INTRAMUSCULAR | Status: DC | PRN

## 2012-02-17 MED ORDER — LIDOCAINE HCL (CARDIAC) 20 MG/ML IV SOLN
INTRAVENOUS | Status: DC | PRN
  Administered 2012-02-17: 50 mg via INTRAVENOUS

## 2012-02-17 MED ORDER — SPIRONOLACTONE 25 MG PO TABS
25.0000 mg | ORAL_TABLET | Freq: Every day | ORAL | Status: DC
  Administered 2012-02-17 – 2012-02-20 (×4): 25 mg via ORAL
  Filled 2012-02-17 (×5): qty 1

## 2012-02-17 MED ORDER — CHLORHEXIDINE GLUCONATE 4 % EX LIQD: 60.0000 mL | Freq: Once | CUTANEOUS | Status: DC

## 2012-02-17 MED ORDER — METHOCARBAMOL 100 MG/ML IJ SOLN
500.0000 mg | Freq: Four times a day (QID) | INTRAVENOUS | Status: DC | PRN
  Administered 2012-02-17: 500 mg via INTRAVENOUS
  Filled 2012-02-17: qty 5

## 2012-02-17 MED ORDER — ACETAMINOPHEN 325 MG PO TABS
650.0000 mg | ORAL_TABLET | Freq: Four times a day (QID) | ORAL | Status: DC | PRN
  Administered 2012-02-18 (×2): 650 mg via ORAL
  Filled 2012-02-17 (×2): qty 2

## 2012-02-17 MED ORDER — WARFARIN VIDEO
Freq: Once | Status: AC
  Administered 2012-02-18: 12:00:00

## 2012-02-17 MED ORDER — PROPOFOL 10 MG/ML IV EMUL
INTRAVENOUS | Status: DC | PRN
  Administered 2012-02-17: 150 mg via INTRAVENOUS

## 2012-02-17 MED ORDER — METOCLOPRAMIDE HCL 5 MG/ML IJ SOLN: 5.0000 mg | Freq: Three times a day (TID) | INTRAMUSCULAR | Status: DC | PRN

## 2012-02-17 MED ORDER — NALOXONE HCL 0.4 MG/ML IJ SOLN: 0.4000 mg | INTRAMUSCULAR | Status: DC | PRN

## 2012-02-17 MED ORDER — ACETAMINOPHEN 10 MG/ML IV SOLN
INTRAVENOUS | Status: AC
  Filled 2012-02-17: qty 100

## 2012-02-17 MED ORDER — TRAVOPROST 0.004 % OP SOLN: 1.0000 [drp] | Freq: Every day | OPHTHALMIC | Status: DC

## 2012-02-17 MED ORDER — BUPIVACAINE HCL (PF) 0.25 % IJ SOLN
INTRAMUSCULAR | Status: DC | PRN
  Administered 2012-02-17: 20 mL via INTRA_ARTICULAR

## 2012-02-17 MED ORDER — MORPHINE SULFATE (PF) 1 MG/ML IV SOLN
INTRAVENOUS | Status: DC
  Administered 2012-02-17: 11:00:00 via INTRAVENOUS
  Administered 2012-02-17: 3 mg via INTRAVENOUS
  Administered 2012-02-18: 5 mg via INTRAVENOUS

## 2012-02-17 MED ORDER — POLYVINYL ALCOHOL 1.4 % OP SOLN
1.0000 [drp] | OPHTHALMIC | Status: DC | PRN
  Administered 2012-02-18: 1 [drp] via OPHTHALMIC
  Filled 2012-02-17: qty 15

## 2012-02-17 MED ORDER — METHOCARBAMOL 500 MG PO TABS
500.0000 mg | ORAL_TABLET | Freq: Four times a day (QID) | ORAL | Status: DC | PRN
  Filled 2012-02-17: qty 1

## 2012-02-17 MED ORDER — WARFARIN SODIUM 7.5 MG PO TABS
7.5000 mg | ORAL_TABLET | Freq: Once | ORAL | Status: AC
  Administered 2012-02-17: 7.5 mg via ORAL
  Filled 2012-02-17: qty 1

## 2012-02-17 MED ORDER — LEVOTHYROXINE SODIUM 88 MCG PO TABS
88.0000 ug | ORAL_TABLET | Freq: Every day | ORAL | Status: DC
  Administered 2012-02-18 – 2012-02-21 (×4): 88 ug via ORAL
  Filled 2012-02-17 (×5): qty 1

## 2012-02-17 MED ORDER — MIDAZOLAM HCL 5 MG/5ML IJ SOLN
INTRAMUSCULAR | Status: DC | PRN
  Administered 2012-02-17: 2 mg via INTRAVENOUS

## 2012-02-17 MED ORDER — TRAVOPROST (BAK FREE) 0.004 % OP SOLN
1.0000 [drp] | Freq: Every day | OPHTHALMIC | Status: DC
  Administered 2012-02-18 – 2012-02-20 (×4): 1 [drp] via OPHTHALMIC
  Filled 2012-02-17: qty 2.5

## 2012-02-17 MED ORDER — WARFARIN - PHARMACIST DOSING INPATIENT: Freq: Every day | Status: DC

## 2012-02-17 MED ORDER — HYDROCHLOROTHIAZIDE 25 MG PO TABS
25.0000 mg | ORAL_TABLET | Freq: Every day | ORAL | Status: DC
  Administered 2012-02-17 – 2012-02-20 (×4): 25 mg via ORAL
  Filled 2012-02-17 (×5): qty 1

## 2012-02-17 MED ORDER — ONDANSETRON HCL 4 MG/2ML IJ SOLN
INTRAMUSCULAR | Status: DC | PRN
  Administered 2012-02-17: 4 mg via INTRAVENOUS

## 2012-02-17 MED ORDER — ONDANSETRON HCL 4 MG PO TABS: 4.0000 mg | ORAL_TABLET | Freq: Four times a day (QID) | ORAL | Status: DC | PRN

## 2012-02-17 MED ORDER — CLINDAMYCIN PHOSPHATE 600 MG/50ML IV SOLN
600.0000 mg | Freq: Three times a day (TID) | INTRAVENOUS | Status: AC
  Administered 2012-02-17 – 2012-02-18 (×3): 600 mg via INTRAVENOUS
  Filled 2012-02-17 (×3): qty 50

## 2012-02-17 MED ORDER — CETAPHIL MOISTURIZING EX LOTN
TOPICAL_LOTION | Freq: Three times a day (TID) | CUTANEOUS | Status: DC | PRN
  Filled 2012-02-17: qty 473

## 2012-02-17 MED ORDER — ONDANSETRON HCL 4 MG/2ML IJ SOLN: 4.0000 mg | Freq: Once | INTRAMUSCULAR | Status: DC | PRN

## 2012-02-17 MED ORDER — FENTANYL CITRATE 0.05 MG/ML IJ SOLN
INTRAMUSCULAR | Status: DC | PRN
  Administered 2012-02-17: 50 ug via INTRAVENOUS
  Administered 2012-02-17 (×2): 25 ug via INTRAVENOUS
  Administered 2012-02-17 (×2): 100 ug via INTRAVENOUS

## 2012-02-17 MED ORDER — METOCLOPRAMIDE HCL 10 MG PO TABS: 5.0000 mg | ORAL_TABLET | Freq: Three times a day (TID) | ORAL | Status: DC | PRN

## 2012-02-17 MED ORDER — PHENOL 1.4 % MT LIQD: 1.0000 | OROMUCOSAL | Status: DC | PRN

## 2012-02-17 MED ORDER — ACETAMINOPHEN 10 MG/ML IV SOLN
INTRAVENOUS | Status: DC | PRN
  Administered 2012-02-17: 1000 mg via INTRAVENOUS

## 2012-02-17 MED ORDER — SODIUM CHLORIDE 0.9 % IJ SOLN: 9.0000 mL | INTRAMUSCULAR | Status: DC | PRN

## 2012-02-17 MED ORDER — ACETAMINOPHEN 650 MG RE SUPP: 650.0000 mg | Freq: Four times a day (QID) | RECTAL | Status: DC | PRN

## 2012-02-17 MED ORDER — MORPHINE SULFATE 4 MG/ML IJ SOLN
INTRAMUSCULAR | Status: DC | PRN
  Administered 2012-02-17: 8 mg

## 2012-02-17 MED ORDER — DIPHENHYDRAMINE HCL 12.5 MG/5ML PO ELIX: 12.5000 mg | ORAL_SOLUTION | Freq: Four times a day (QID) | ORAL | Status: DC | PRN

## 2012-02-17 MED ORDER — CLONIDINE HCL (ANALGESIA) 100 MCG/ML EP SOLN
100.0000 ug | EPIDURAL | Status: DC
  Filled 2012-02-17: qty 1

## 2012-02-17 MED ORDER — POTASSIUM CHLORIDE IN NACL 20-0.9 MEQ/L-% IV SOLN
INTRAVENOUS | Status: AC
  Administered 2012-02-17 – 2012-02-18 (×2): via INTRAVENOUS
  Filled 2012-02-17 (×4): qty 1000

## 2012-02-17 MED ORDER — DIPHENHYDRAMINE HCL 50 MG/ML IJ SOLN: 12.5000 mg | Freq: Four times a day (QID) | INTRAMUSCULAR | Status: DC | PRN

## 2012-02-17 MED ORDER — LACTATED RINGERS IV SOLN
INTRAVENOUS | Status: DC | PRN
  Administered 2012-02-17 (×3): via INTRAVENOUS

## 2012-02-17 MED ORDER — ROCURONIUM BROMIDE 100 MG/10ML IV SOLN
INTRAVENOUS | Status: DC | PRN
  Administered 2012-02-17: 40 mg via INTRAVENOUS

## 2012-02-17 MED ORDER — CLONIDINE HCL (ANALGESIA) 100 MCG/ML EP SOLN
EPIDURAL | Status: DC | PRN
  Administered 2012-02-17: 1 mL via INTRA_ARTICULAR

## 2012-02-17 MED ORDER — MENTHOL 3 MG MT LOZG: 1.0000 | LOZENGE | OROMUCOSAL | Status: DC | PRN

## 2012-02-17 MED ORDER — PATIENT'S GUIDE TO USING COUMADIN BOOK
Freq: Once | Status: AC
  Administered 2012-02-17: 14:00:00
  Filled 2012-02-17: qty 1

## 2012-02-17 SURGICAL SUPPLY — 74 items
BANDAGE ELASTIC 4 VELCRO ST LF (GAUZE/BANDAGES/DRESSINGS) ×2 IMPLANT
BANDAGE ELASTIC 6 VELCRO ST LF (GAUZE/BANDAGES/DRESSINGS) ×2 IMPLANT
BANDAGE ESMARK 6X9 LF (GAUZE/BANDAGES/DRESSINGS) ×1 IMPLANT
BLADE SAG 18X100X1.27 (BLADE) ×2 IMPLANT
BLADE SAW SGTL 13.0X1.19X90.0M (BLADE) ×2 IMPLANT
BNDG COHESIVE 6X5 TAN STRL LF (GAUZE/BANDAGES/DRESSINGS) ×2 IMPLANT
BNDG ELASTIC 6X10 VLCR STRL LF (GAUZE/BANDAGES/DRESSINGS) ×6 IMPLANT
BNDG ESMARK 6X9 LF (GAUZE/BANDAGES/DRESSINGS) ×2
BOWL SMART MIX CTS (DISPOSABLE) ×2 IMPLANT
CEMENT HV SMART SET (Cement) ×4 IMPLANT
CLOTH BEACON ORANGE TIMEOUT ST (SAFETY) ×2 IMPLANT
COVER BACK TABLE 24X17X13 BIG (DRAPES) IMPLANT
COVER SURGICAL LIGHT HANDLE (MISCELLANEOUS) ×2 IMPLANT
CUFF TOURNIQUET SINGLE 34IN LL (TOURNIQUET CUFF) ×2 IMPLANT
CUFF TOURNIQUET SINGLE 44IN (TOURNIQUET CUFF) IMPLANT
DRAPE INCISE IOBAN 66X45 STRL (DRAPES) ×2 IMPLANT
DRAPE ORTHO SPLIT 77X108 STRL (DRAPES) ×3
DRAPE PROXIMA HALF (DRAPES) ×2 IMPLANT
DRAPE SURG ORHT 6 SPLT 77X108 (DRAPES) ×3 IMPLANT
DRAPE U-SHAPE 47X51 STRL (DRAPES) ×2 IMPLANT
DRAPE X-RAY CASS 24X20 (DRAPES) IMPLANT
DRSG PAD ABDOMINAL 8X10 ST (GAUZE/BANDAGES/DRESSINGS) ×2 IMPLANT
DURAPREP 26ML APPLICATOR (WOUND CARE) ×2 IMPLANT
ELECT REM PT RETURN 9FT ADLT (ELECTROSURGICAL) ×2
ELECTRODE REM PT RTRN 9FT ADLT (ELECTROSURGICAL) ×1 IMPLANT
EVACUATOR 1/8 PVC DRAIN (DRAIN) ×2 IMPLANT
FACESHIELD LNG OPTICON STERILE (SAFETY) ×2 IMPLANT
FLUID NSS /IRRIG 3000 ML XXX (IV SOLUTION) ×2 IMPLANT
GAUZE XEROFORM 5X9 LF (GAUZE/BANDAGES/DRESSINGS) ×2 IMPLANT
GLOVE BIO SURGEON ST LM GN SZ9 (GLOVE) ×2 IMPLANT
GLOVE BIOGEL PI IND STRL 8 (GLOVE) ×1 IMPLANT
GLOVE BIOGEL PI INDICATOR 8 (GLOVE) ×1
GLOVE SURG ORTHO 8.0 STRL STRW (GLOVE) ×2 IMPLANT
GOWN PREVENTION PLUS LG XLONG (DISPOSABLE) ×2 IMPLANT
GOWN PREVENTION PLUS XLARGE (GOWN DISPOSABLE) ×2 IMPLANT
GOWN STRL NON-REIN LRG LVL3 (GOWN DISPOSABLE) ×6 IMPLANT
HANDPIECE INTERPULSE COAX TIP (DISPOSABLE) ×1
HOOD PEEL AWAY FACE SHEILD DIS (HOOD) ×6 IMPLANT
IMMOBILIZER KNEE 20 (SOFTGOODS)
IMMOBILIZER KNEE 20 THIGH 36 (SOFTGOODS) IMPLANT
IMMOBILIZER KNEE 22 UNIV (SOFTGOODS) ×2 IMPLANT
IMMOBILIZER KNEE 24 THIGH 36 (MISCELLANEOUS) IMPLANT
IMMOBILIZER KNEE 24 UNIV (MISCELLANEOUS)
KIT BASIN OR (CUSTOM PROCEDURE TRAY) ×2 IMPLANT
KIT ROOM TURNOVER OR (KITS) ×2 IMPLANT
MANIFOLD NEPTUNE II (INSTRUMENTS) ×2 IMPLANT
MARKER SPHERE PSV REFLC THRD 5 (MARKER) ×6 IMPLANT
NEEDLE 18GX1X1/2 (RX/OR ONLY) (NEEDLE) ×2 IMPLANT
NEEDLE SPNL 18GX3.5 QUINCKE PK (NEEDLE) ×2 IMPLANT
NS IRRIG 1000ML POUR BTL (IV SOLUTION) ×2 IMPLANT
PACK TOTAL JOINT (CUSTOM PROCEDURE TRAY) ×2 IMPLANT
PAD ARMBOARD 7.5X6 YLW CONV (MISCELLANEOUS) ×4 IMPLANT
PAD CAST 4YDX4 CTTN HI CHSV (CAST SUPPLIES) ×1 IMPLANT
PADDING CAST COTTON 4X4 STRL (CAST SUPPLIES) ×1
PADDING CAST COTTON 6X4 STRL (CAST SUPPLIES) ×2 IMPLANT
PIN SCHANZ 4MM 130MM (PIN) ×8 IMPLANT
RUBBERBAND STERILE (MISCELLANEOUS) ×2 IMPLANT
SET HNDPC FAN SPRY TIP SCT (DISPOSABLE) ×1 IMPLANT
SPONGE GAUZE 4X4 12PLY (GAUZE/BANDAGES/DRESSINGS) ×2 IMPLANT
SPONGE LAP 18X18 X RAY DECT (DISPOSABLE) IMPLANT
STAPLER VISISTAT 35W (STAPLE) ×2 IMPLANT
SUCTION FRAZIER TIP 10 FR DISP (SUCTIONS) ×2 IMPLANT
SUT ETHILON 3 0 PS 1 (SUTURE) ×4 IMPLANT
SUT VIC AB 0 CTB1 27 (SUTURE) ×6 IMPLANT
SUT VIC AB 1 CT1 27 (SUTURE) ×5
SUT VIC AB 1 CT1 27XBRD ANBCTR (SUTURE) ×5 IMPLANT
SUT VIC AB 2-0 CT1 27 (SUTURE) ×2
SUT VIC AB 2-0 CT1 TAPERPNT 27 (SUTURE) ×2 IMPLANT
SYR 30ML SLIP (SYRINGE) ×2 IMPLANT
SYR TB 1ML LUER SLIP (SYRINGE) ×2 IMPLANT
TOWEL OR 17X24 6PK STRL BLUE (TOWEL DISPOSABLE) ×2 IMPLANT
TOWEL OR 17X26 10 PK STRL BLUE (TOWEL DISPOSABLE) ×8 IMPLANT
TRAY FOLEY CATH 14FR (SET/KITS/TRAYS/PACK) ×2 IMPLANT
WATER STERILE IRR 1000ML POUR (IV SOLUTION) ×6 IMPLANT

## 2012-02-17 NOTE — Progress Notes (Signed)
Orthopedic Tech Progress Note Patient Details:  Alisha [name] 1934-12-02 [id number]  CPM Right Knee CPM Right Knee: On Right Knee Flexion (Degrees): 30  Right Knee Extension (Degrees): 0    Cammer, Mickie Bail 02/17/2012, 12:28 PM

## 2012-02-17 NOTE — Transfer of Care (Signed)
Immediate Anesthesia Transfer of Care Note  Patient: Alisha [name]  Procedure(s) Performed: Procedure(s) (LRB): COMPUTER ASSISTED TOTAL KNEE ARTHROPLASTY (Right)  Patient Location: PACU  Anesthesia Type: General  Level of Consciousness: awake, alert , oriented and patient cooperative  Airway & Oxygen Therapy: Patient Spontanous Breathing and Patient connected to face mask oxygen  Post-op Assessment: Report given to PACU RN, Post -op Vital signs reviewed and stable and Patient moving all extremities  Post vital signs: Reviewed and stable  Complications: No apparent anesthesia complications

## 2012-02-17 NOTE — Anesthesia Preprocedure Evaluation (Addendum)
Anesthesia Evaluation  Patient identified by MRN, date of birth, ID band Patient awake    Reviewed: Allergy & Precautions, H&P , NPO status , Patient's Chart, lab work & pertinent test results, reviewed documented beta blocker date and time   Airway Mallampati: II TM Distance: >3 FB Neck ROM: Full    Dental  (+) Partial Upper and Dental Advisory Given   Pulmonary asthma ,  breath sounds clear to auscultation        Cardiovascular hypertension, Pt. on medications Rhythm:Regular Rate:Normal     Neuro/Psych    GI/Hepatic   Endo/Other  Hypothyroidism   Renal/GU      Musculoskeletal   Abdominal   Peds  Hematology   Anesthesia Other Findings   Reproductive/Obstetrics                          Anesthesia Physical Anesthesia Plan  ASA: III  Anesthesia Plan: General   Post-op Pain Management:    Induction: Intravenous  Airway Management Planned: Oral ETT  Additional Equipment:   Intra-op Plan:   Post-operative Plan: Extubation in OR  Informed Consent: I have reviewed the patients History and Physical, chart, labs and discussed the procedure including the risks, benefits and alternatives for the proposed anesthesia with the patient or authorized representative who has indicated his/her understanding and acceptance.   Dental advisory given  Plan Discussed with: Surgeon, Anesthesiologist and CRNA  Anesthesia Plan Comments: (Htn Obesity DJD R. Knee  Plan GA with Saphenous nerve block.  Kipp Brood, MD)      Anesthesia Quick Evaluation

## 2012-02-17 NOTE — Interval H&P Note (Signed)
History and Physical Interval Note:  02/17/2012 7:39 AM  Alisha [name]  has presented today for surgery, with the diagnosis of Right knee arthritis  The various methods of treatment have been discussed with the patient and family. After consideration of risks, benefits and other options for treatment, the patient has consented to  Procedure(s) (LRB): COMPUTER ASSISTED TOTAL KNEE ARTHROPLASTY (Right) as a surgical intervention .  The patients' history has been reviewed, patient examined, no change in status, stable for surgery.  I have reviewed the patients' chart and labs.  Questions were answered to the patient's satisfaction.   Skin -  labs ok will proceed  Javione Gunawan SCOTT

## 2012-02-17 NOTE — Anesthesia Procedure Notes (Addendum)
Procedure Name: Intubation Date/Time: 02/17/2012 7:50 AM Performed by: Jerilee Hoh Pre-anesthesia Checklist: Patient identified, Emergency Drugs available, Suction available and Patient being monitored Patient Re-evaluated:Patient Re-evaluated prior to inductionOxygen Delivery Method: Circle system utilized Preoxygenation: Pre-oxygenation with 100% oxygen Intubation Type: IV induction Ventilation: Mask ventilation without difficulty and Oral airway inserted - appropriate to patient size Laryngoscope Size: Mac and 3 Grade View: Grade II Tube type: Oral Tube size: 7.5 mm Number of attempts: 1 Airway Equipment and Method: Stylet Placement Confirmation: ETT inserted through vocal cords under direct vision,  positive ETCO2 and breath sounds checked- equal and bilateral Secured at: 21 cm Tube secured with: Tape Dental Injury: Teeth and Oropharynx as per pre-operative assessment    Anesthesia Regional Block:    Pre-Anesthetic Checklist: ,, timeout performed, Correct Patient, Correct Site, Correct Laterality, Correct Procedure, Correct Position, site marked, Risks and benefits discussed,  Surgical consent,  Pre-op evaluation,  At surgeon's request and post-op pain management  Laterality: Right  Prep: chloraprep       Needles:   Needle Type: Echogenic Stimulator Needle     Needle Length:cm 9 cm Needle Gauge: 22 and 22 G    Additional Needles:  Procedures: ultrasound guided  Narrative:  Start time: 02/17/2012 7:20 AM End time: 02/17/2012 7:30 AM  Performed by: Personally   Additional Notes: R. Mid-thigh Saphenous Nerve Block: 25 cc 0.5% Naropin injected into R. Adductor canal under US guidance.  Kipp Brood, MD

## 2012-02-17 NOTE — Progress Notes (Signed)
ANTICOAGULATION CONSULT NOTE - Initial Consult  Pharmacy Consult for Coumadin Indication: VTE prophylaxis  Allergies  Allergen Reactions  . Penicillins Itching and Rash  . Sulfa Antibiotics Itching and Rash    Patient Measurements:   Heparin Dosing Weight:  90.3 kg  Vital Signs: Temp: 98.2 F (36.8 C) (04/30 1230) Temp src: Oral (04/30 0543) BP: 149/56 mmHg (04/30 1230) Pulse Rate: 62  (04/30 1200)  Labs: No results found for this basename: HGB:2,HCT:3,PLT:3,APTT:3,LABPROT:3,INR:3,HEPARINUNFRC:3,CREATININE:3,CKTOTAL:3,CKMB:3,TROPONINI:3 in the last 72 hours CrCl is unknown because there is no height on file for the current visit.  Medical History: Past Medical History  Diagnosis Date  . Hypertension     on meds  . Hypothyroidism     on meds  . Blood transfusion     over 40 y ears  . Arthritis   . Asthma     Medications:  Prescriptions prior to admission  Medication Sig Dispense Refill  . cetaphil (CETAPHIL) cream Apply 1 application topically 3 (three) times daily as needed. For dry skin      . estradiol (ESTRACE) 0.5 MG tablet Take 0.5 mg by mouth daily.      Marland Kitchen gabapentin (NEURONTIN) 300 MG capsule Take 300 mg by mouth 3 (three) times daily as needed. For itching      . hydrochlorothiazide (HYDRODIURIL) 25 MG tablet Take 25 mg by mouth daily.      Marland Kitchen levothyroxine (SYNTHROID, LEVOTHROID) 88 MCG tablet Take 88 mcg by mouth daily.      Marland Kitchen OVER THE COUNTER MEDICATION Take 1 tablet by mouth daily. "Muscular Degeneration Supplement (eye vitamin)      . Propylene Glycol-Glycerin (SOOTHE OP) Apply 1 drop to eye 2 (two) times daily as needed. For dry eys      . spironolactone (ALDACTONE) 25 MG tablet Take 25 mg by mouth daily.      . travoprost, benzalkonium, (TRAVATAN) 0.004 % ophthalmic solution Place 1 drop into both eyes at bedtime.        Assessment: R TKA to start on Coumadin for VTE prophx with baseline INR not done. No H&P dictated in Epic. Baseline Hgb 13.3 and  platelets 309 ok. Patient states she did takes Coumadin exactly 1 year ago for her other knee surgery. Coumadin points = 5.  Goal of Therapy:  INR 2-3   Plan:  Coumadin 7.5mg  po x 1 today. Stat baseline PT/INR then daily. Initiate education with book/video.  Merilynn Finland, [employer] 02/17/2012,1:42 PM

## 2012-02-17 NOTE — Preoperative (Signed)
Beta Blockers   Reason not to administer Beta Blockers:Not Applicable 

## 2012-02-17 NOTE — Anesthesia Postprocedure Evaluation (Signed)
  Anesthesia Post-op Note  Patient: Alisha [name]  Procedure(s) Performed: Procedure(s) (LRB): COMPUTER ASSISTED TOTAL KNEE ARTHROPLASTY (Right)  Patient Location: PACU  Anesthesia Type: General and GA combined with regional for post-op pain  Level of Consciousness: awake, alert  and oriented  Airway and Oxygen Therapy: Patient Spontanous Breathing and Patient connected to nasal cannula oxygen  Post-op Pain: mild  Post-op Assessment: Post-op Vital signs reviewed and Patient's Cardiovascular Status Stable  Post-op Vital Signs: stable  Complications: No apparent anesthesia complications

## 2012-02-17 NOTE — Brief Op Note (Signed)
02/17/2012  11:02 AM  PATIENT:  Alisha [name]  77 y.o. female  PRE-OPERATIVE DIAGNOSIS:  Right knee arthritis  POST-OPERATIVE DIAGNOSIS:  Right knee arthritis  PROCEDURE:  Procedure(s): COMPUTER ASSISTED TOTAL KNEE ARTHROPLASTY  SURGEON:  Surgeon(s): Cammy Copa, MD  ASSISTANT: S vernon  ANESTHESIA:   general  EBL: 50 ml    Total I/O In: 2300 [I.V.:2300] Out: 250 [Urine:150; Blood:100]  BLOOD ADMINISTERED: none  DRAINS: none   LOCAL MEDICATIONS USED:  none  SPECIMEN:  No Specimen  COUNTS:  YES  TOURNIQUET:   Total Tourniquet Time Documented: Thigh (Right) - 111 minutes  DICTATION: .Other Dictation: Dictation Number [id number]  PLAN OF CARE: Admit to inpatient   PATIENT DISPOSITION:  PACU - hemodynamically stable

## 2012-02-17 NOTE — Plan of Care (Signed)
Problem: Consults Goal: Diagnosis- Total Joint Replacement Outcome: Completed/Met Date Met:  02/17/12 Primary Total Knee     

## 2012-02-18 ENCOUNTER — Encounter (HOSPITAL_COMMUNITY): Payer: Self-pay | Admitting: Orthopedic Surgery

## 2012-02-18 LAB — CBC
HCT: 31.6 % — ABNORMAL LOW (ref 36.0–46.0)
Hemoglobin: 10.5 g/dL — ABNORMAL LOW (ref 12.0–15.0)
MCH: 30.9 pg (ref 26.0–34.0)
MCHC: 33.2 g/dL (ref 30.0–36.0)
MCV: 92.9 fL (ref 78.0–100.0)
Platelets: 266 10*3/uL (ref 150–400)
RBC: 3.4 MIL/uL — ABNORMAL LOW (ref 3.87–5.11)
RDW: 13.1 % (ref 11.5–15.5)
WBC: 13.3 10*3/uL — ABNORMAL HIGH (ref 4.0–10.5)

## 2012-02-18 LAB — BASIC METABOLIC PANEL
BUN: 8 mg/dL (ref 6–23)
CO2: 25 mEq/L (ref 19–32)
Calcium: 8.1 mg/dL — ABNORMAL LOW (ref 8.4–10.5)
Chloride: 103 mEq/L (ref 96–112)
Creatinine, Ser: 0.68 mg/dL (ref 0.50–1.10)
GFR calc Af Amer: >90 mL/min (ref >90)
GFR calc non Af Amer: 83 mL/min — ABNORMAL LOW (ref >90)
Glucose, Bld: 121 mg/dL — ABNORMAL HIGH (ref 70–99)
Potassium: 3.9 mEq/L (ref 3.5–5.1)
Sodium: 138 mEq/L (ref 135–145)

## 2012-02-18 LAB — PROTIME-INR
INR: 1.09 (ref 0.00–1.49)
Prothrombin Time: 14.3 seconds (ref 11.6–15.2)

## 2012-02-18 MED ORDER — OXYCODONE HCL 5 MG PO TABS
10.0000 mg | ORAL_TABLET | ORAL | Status: DC | PRN
  Administered 2012-02-18 – 2012-02-21 (×15): 10 mg via ORAL
  Filled 2012-02-18 (×15): qty 2

## 2012-02-18 MED ORDER — WARFARIN SODIUM 7.5 MG PO TABS
7.5000 mg | ORAL_TABLET | Freq: Once | ORAL | Status: AC
  Administered 2012-02-18: 7.5 mg via ORAL
  Filled 2012-02-18: qty 1

## 2012-02-18 NOTE — Progress Notes (Signed)
02/18/12 1700  PT Visit Information  Last PT Received On 02/18/12  Assistance Needed +1  PT Time Calculation  PT Start Time 1615  PT Stop Time 1645  PT Time Calculation (min) 30 min  Subjective Data  Subjective It makes it easier when you make me laugh  Patient Stated Goal Back home Independent, back to work  Precautions  Precautions Knee  Precaution Booklet Issued No  Required Braces or Orthoses Knee Immobilizer - Right  Restrictions  RLE Weight Bearing WBAT  Cognition  Overall Cognitive Status Appears within functional limits for tasks assessed/performed  Arousal/Alertness Awake/alert  Orientation Level Oriented X4 / Intact  Behavior During Session [facility] for tasks performed  Bed Mobility  Bed Mobility Supine to Sit;Sitting - Scoot to Edge of Bed  Supine to Sit 4: Min assist  Sitting - Scoot to [place] of Bed 3: Mod assist  Details for Bed Mobility Assistance vc's for hand placement and technique to make it easier to move in bed; minimal truncal assist  Transfers  Transfers Sit to Stand;Stand to Sit  Sit to Stand 4: Min assist;With armrests;From chair/3-in-1  Stand to Sit 4: Min assist;With armrests;To chair/3-in-1  Details for Transfer Assistance vc's for hand placement; min assist to help pt translate forward over BOS  Ambulation/Gait  Ambulation/Gait Assistance 5: Supervision  Ambulation Distance (Feet) 82 Feet  Assistive device Rolling walker  Ambulation/Gait Assistance Details reinforcement of sequencing  Gait Pattern Step-to pattern  Stairs No  Balance  Balance Assessed No  Exercises  Exercises Total Joint  Total Joint Exercises  Ankle Circles/Pumps AROM;Both;20 reps;Supine  Quad Sets AAROM;Both;10 reps;Supine  Heel Slides AAROM;Right;10 reps;Supine  Hip ABduction/ADduction AAROM;Right;10 reps;Supine  Straight Leg Raises AAROM;Right;10 reps;Supine  Short Arc Quad AAROM;10 reps;Supine;Right  PT - End of Session  Activity Tolerance Patient tolerated treatment well    Patient left in chair;with call bell/phone within reach  Nurse Communication Mobility status  PT - Assessment/Plan  Comments on Treatment Session Coming along quite well with exercises.  Discontinued immobilizer . Mobility at a min to supervision level.  PT Plan Discharge plan remains appropriate  PT Frequency 7X/week  Recommendations for Other Services OT consult  Follow Up Recommendations Home health PT  Equipment Recommended None recommended by PT;Defer to next venue  Acute Rehab PT Goals  PT Goal Formulation With patient  Time For Goal Achievement 02/25/12  Potential to Achieve Goals Good  PT Goal: Supine/Side to Sit - Progress Progressing toward goal  PT Goal: Sit to Stand - Progress Progressing toward goal  PT Transfer Goal: Bed to Chair/Chair to Bed - Progress Progressing toward goal  PT Goal: Ambulate - Progress Progressing toward goal  PT Goal: Perform Home Exercise Program - Progress Progressing toward goal    02/18/2012  [place] Bing, PT [phone] (pager)

## 2012-02-18 NOTE — Progress Notes (Signed)
Subjective: Pt stable pain controlled   Objective: Vital signs in last 24 hours: Temp:  [97 F (36.1 C)-101 F (38.3 C)] 101 F (38.3 C) (05/01 0650) Pulse Rate:  [60-71] 60  (05/01 0650) Resp:  [10-20] 14  (05/01 0650) BP: (115-155)/(49-78) 115/54 mmHg (05/01 0650) SpO2:  [96 %-100 %] 98 % (05/01 0650) Weight:  [90.3 kg (199 lb 1.2 oz)] 90.3 kg (199 lb 1.2 oz) (04/30 1310)  Intake/Output from previous day: 04/30 0701 - 05/01 0700 In: 2300 [I.V.:2300] Out: 1250 [Urine:1150; Blood:100] Intake/Output this shift:    Exam:  Neurovascular intact Sensation intact distally Dorsiflexion/Plantar flexion intact  Labs:  Basename 02/18/12 0620  HGB 10.5*    Basename 02/18/12 0620  WBC 13.3*  RBC 3.40*  HCT 31.6*  PLT 266    Basename 02/18/12 0620  NA 138  K 3.9  CL 103  CO2 25  BUN 8  CREATININE 0.68  GLUCOSE 121*  CALCIUM 8.1*    Basename 02/18/12 0620 02/17/12 1351  LABPT -- --  INR 1.09 1.00    Assessment/Plan: Mobilize today - CPM - DC pca   Alisha [name] 02/18/2012, 8:06 AM

## 2012-02-18 NOTE — Consult Note (Signed)
ANTICOAGULATION CONSULT NOTE - Follow Up Consult  Pharmacy Consult for : Coumadin Indication: VTE prophylaxis  Allergies  Allergen Reactions  . Penicillins Itching and Rash  . Sulfa Antibiotics Itching and Rash    Patient Measurements: Height: 5\' 3"  (160 cm) Weight: 199 lb 1.2 oz (90.3 kg) IBW/kg (Calculated) : 52.4    Vital Signs: Temp: 101 F (38.3 C) (05/01 0650) Temp src: Oral (05/01 0650) BP: 115/54 mmHg (05/01 0650) Pulse Rate: 60  (05/01 0650)  Labs:  Basename 02/18/12 0620 02/17/12 1351  HGB 10.5* --  HCT 31.6* --  PLT 266 --  APTT -- --  LABPROT 14.3 13.4  INR 1.09 1.00  HEPARINUNFRC -- --  CREATININE 0.68 --  CKTOTAL -- --  CKMB -- --  TROPONINI -- --   Estimated Creatinine Clearance: 64.8 ml/min (by C-G formula based on Cr of 0.68).   Medications:     clindamycin (CLEOCIN) IV 600 mg Intravenous Q8H  hydrochlorothiazide 25 mg Oral Daily  levothyroxine 88 mcg Oral QAC breakfast  patient's guide to using coumadin book  Does not apply Once  spironolactone 25 mg Oral Daily  Travoprost (BAK Free) 1 drop Both Eyes QHS  warfarin 7.5 mg Oral ONCE-1800  warfarin  Does not apply Once  Warfarin - Pharmacist Dosing Inpatient  Does not apply q1800  DISCONTD: chlorhexidine 60 mL Topical Once  DISCONTD: cloNIDine 100 mcg Intra-articular To OR  DISCONTD: morphine  Intravenous Q4H  DISCONTD: travoprost (benzalkonium) 1 drop Both Eyes QHS    Assessment:  Day # 1 Post op s/p R-TKA on Coumadin for VTE prophylaxis.  INR  1  >>  1.09.  No bleeding noted from Coumadin.  Goal of Therapy:  INR 2-3   Plan:   Coumadin 7.5 mg today Warf ED initiated  Laurena Bering, Pharm.D. 02/18/2012 9:13 AM

## 2012-02-18 NOTE — Progress Notes (Signed)
Foley dc'd this morning at 0530.  Patient is due to void.

## 2012-02-18 NOTE — Progress Notes (Signed)
UR COMPLETED  

## 2012-02-18 NOTE — Evaluation (Signed)
Physical Therapy Evaluation Patient Details Name: Alisha [name] MRN: [id number] DOB: [birth year] Today's Date: 02/18/2012 Time: 7829-5621 PT Time Calculation (min): 38 min  PT Assessment / Plan / Recommendation Clinical Impression  Pt s/p R TKA. Presently pt doing very well and just limited by mild weakness and decr ROM at the knee.  Rec. HHPT    PT Assessment  Patient needs continued PT services    Follow Up Recommendations  Home health PT    Equipment Recommendations  None recommended by PT;Defer to next venue    Frequency 7X/week    Precautions / Restrictions Precautions Precautions: Knee Precaution Booklet Issued: No Required Braces or Orthoses: Knee Immobilizer - Right Knee Immobilizer - Right: Discontinue post op day 2 Restrictions RLE Weight Bearing: Weight bearing as tolerated   Pertinent Vitals/Pain 8/10 at R knee       Mobility  Transfers Transfers: Sit to Stand;Stand to Sit Sit to Stand: 4: Min assist;With armrests;From chair/3-in-1 Stand to Sit: 4: Min assist;With armrests;To chair/3-in-1 Details for Transfer Assistance: vc's for hand placement; min assist to help pt translate forward over BOS Ambulation/Gait Ambulation/Gait Assistance: 5: Supervision Ambulation Distance (Feet): 55 Feet Assistive device: Rolling walker Ambulation/Gait Assistance Details: vc's for improved safer use of RW and sequencing Gait Pattern: Step-to pattern Stairs: No    Exercises Total Joint Exercises Ankle Circles/Pumps: AROM;Other reps (comment);Other (comment);Seated (30) Quad Sets: AAROM;Both;10 reps;Seated Heel Slides: AAROM;Right;10 reps;Seated Hip ABduction/ADduction: AAROM;Right;15 reps;Seated Straight Leg Raises: AAROM;Right;10 reps;Seated   PT Goals Acute Rehab PT Goals PT Goal Formulation: With patient Time For Goal Achievement: 02/25/12 Potential to Achieve Goals: Good Pt will go Supine/Side to Sit: with modified independence PT Goal: Supine/Side to Sit -  Progress: Goal set today Pt will go Sit to Stand: with supervision PT Goal: Sit to Stand - Progress: Goal set today Pt will Transfer Bed to Chair/Chair to Bed: with supervision PT Transfer Goal: Bed to Chair/Chair to Bed - Progress: Goal set today Pt will Ambulate: >150 feet;with modified independence;with least restrictive assistive device;with rolling walker PT Goal: Ambulate - Progress: Goal set today Pt will Perform Home Exercise Program: with supervision, verbal cues required/provided PT Goal: Perform Home Exercise Program - Progress: Goal set today  Visit Information  Last PT Received On: 02/18/12 Assistance Needed: +1    Subjective Data  Patient Stated Goal: Back home Independent, back to work   Prior Functioning  Home Living Lives With: Alone Available Help at Discharge: Family Type of Home: House Home Access: Level entry Home Layout: One level;Able to live on main level with bedroom/bathroom Bathroom Shower/Tub: Tub/shower unit;Curtain Bathroom Toilet: Standard Bathroom Accessibility: Yes How Accessible: Accessible via walker Home Adaptive Equipment: Grab bars in shower;Hand-held shower hose;Raised toilet seat with rails;Walker - rolling Prior Function Level of Independence: Independent Able to Take Stairs?: Yes Driving: Yes Vocation: Part time employment Communication Communication: No difficulties Dominant Hand: Right    Cognition  Overall Cognitive Status: Appears within functional limits for tasks assessed/performed Arousal/Alertness: Awake/alert Orientation Level: Oriented X4 / Intact Behavior During Session: [facility] for tasks performed    Extremity/Trunk Assessment Right Upper Extremity Assessment RUE ROM/Strength/Tone: Within functional levels Left Upper Extremity Assessment LUE ROM/Strength/Tone: Within functional levels Right Lower Extremity Assessment RLE ROM/Strength/Tone: [facility] for tasks assessed;Unable to fully assess Left Lower Extremity  Assessment LLE ROM/Strength/Tone: Within functional levels Trunk Assessment Trunk Assessment: Normal   Balance Balance Balance Assessed: No  End of Session PT - End of Session Activity Tolerance: Patient tolerated treatment well;Patient limited  by pain Patient left: in chair;with call bell/phone within reach Nurse Communication: Mobility status CPM Right Knee CPM Right Knee: Off   Temple Sporer, Eliseo Gum 02/18/2012, 10:26 AM  02/18/2012  [place] Bing, PT [phone] (pager)

## 2012-02-18 NOTE — Op Note (Signed)
NAMETANNER, VIGNA NO.:  [id number]  MEDICAL RECORD NO.:  [id number]  LOCATION:  5013                         FACILITY:  [facility]  PHYSICIAN:  Burnard Bunting, M.D.    DATE OF BIRTH:  1934-12-23  DATE OF PROCEDURE:  02/17/2012 DATE OF DISCHARGE:                              OPERATIVE REPORT   PREOPERATIVE DIAGNOSIS:  Right knee arthritis.  POSTOPERATIVE DIAGNOSIS:  Right knee arthritis.  PROCEDURE:  Right total knee replacement using DePuy components, rotating platform, 3 femur, 3 tibia, 38 patella, 17 5 poly.  SURGEONS:  Burnard Bunting, M.D..  ASSISTANT:  Alisha [name], P.A.  ANESTHESIA:  General endotracheal.  BLOOD LOSS:  50 mL.  DRAINS:  None.  INDICATIONS:  Alisha [name] is a 77 year old patient with right knee arthritis who presents for operative management after explanation of risks and benefits.  PROCEDURE IN DETAIL:  The patient was brought to the operating room where general endotracheal anesthesia  was induced and preoperative antibiotics administered.  Right leg was prescrubbed with alcohol, Betadine, which allowed to air dry and then prepped with DuraPrep solution and draped in sterile manner.  Alisha [name] was used to cover the operative field.  Time-out was called.  Right leg was then elevated. Esmarch wrap tourniquet was inflated.  Anterior approach to the knee was made.  Skin and subcutaneous tissue sharply divided.  Median parapatellar approach was made.  Precise location was marked on the extensor mechanism with 1 Vicryl suture.  The lateral patellofemoral ligament was released.  Fat pad was partially excised.  Soft tissue was elevated off the distal anterior femur.  Two pins were placed in the distal medial femur.  Two pins placed in the proximal medial tibia for computer registration points.  Hip center rotation bimalleolar axis various points around the knee were obtained.  The periosteal sleeve was elevated medially down about 2 cm off the  joint surface.  The tibial cut was made approximately 10 mm symmetric cut off both sides which did have tricompartmental arthritis present.  At this time, the pin bouncer with __________ was placed in both flexion and extension.  Distal femoral cut was then made with 11 mm, spacer was then placed.  12.5 and 15 gave full extension.  At this time, chamfer cuts and box cuts were made with collaterals and protected.  Tibia was keel punched.  Both components sized to 3, no notching of the femur occurred.  Trial components were placed.  The patient did have some medial laxity, also the medial collateral ligament was visualized and intact.  This required a larger station and normal.  The patella was cut freehand from 24-14 and a 38 three PEG patella was placed.  The trial components in place, the patient achieved about 5 degrees of extension with a 15 spacer; therefore, 17 spacer was used.  This gave good stability varus valgus stress at 0, 30, and 90 degrees.  No lift-off, trial components were removed.  True components placed after thorough irrigation with 3 L of irrigating solution.  Excess cement removed and good stability in full extension and excellent patellar tracking with no thumbs technique was achieved.  Bleeding points were controlled electrocautery  after irrigating after releasing the tourniquet.  Incision was then closed using #1 Vicryl suture, 0 Vicryl suture, 2-0 Vicryl suture and the skin staples.  Incisions for the pin sites were irrigated and closed using 3- 0 nylon.  Solution of Marcaine, morphine, and clonidine injected into the knee.  The patient was transferred to the recovery room in stable condition.  Bulky dressing was applied.  __________ case to Alisha [name].  Alisha [name]'s assistance was required during the case for opening, closing, limb positioning, retraction of neurovascular structures, following placing of the components, removing of the cement. Her  assistance was a medical necessity.     Burnard Bunting, M.D.     GSD/MEDQ  D:  02/17/2012  T:  02/17/2012  Job:  [id number]

## 2012-02-19 LAB — CBC
HCT: 30.1 % — ABNORMAL LOW (ref 36.0–46.0)
Hemoglobin: 10 g/dL — ABNORMAL LOW (ref 12.0–15.0)
MCH: 30.8 pg (ref 26.0–34.0)
MCHC: 33.2 g/dL (ref 30.0–36.0)
MCV: 92.6 fL (ref 78.0–100.0)
Platelets: 242 10*3/uL (ref 150–400)
RBC: 3.25 MIL/uL — ABNORMAL LOW (ref 3.87–5.11)
RDW: 13 % (ref 11.5–15.5)
WBC: 14.5 10*3/uL — ABNORMAL HIGH (ref 4.0–10.5)

## 2012-02-19 LAB — PROTIME-INR
INR: 1.66 — ABNORMAL HIGH (ref 0.00–1.49)
Prothrombin Time: 19.9 seconds — ABNORMAL HIGH (ref 11.6–15.2)

## 2012-02-19 MED ORDER — WARFARIN SODIUM 5 MG PO TABS
5.0000 mg | ORAL_TABLET | Freq: Once | ORAL | Status: AC
  Administered 2012-02-19: 5 mg via ORAL
  Filled 2012-02-19: qty 1

## 2012-02-19 MED ORDER — MAGNESIUM HYDROXIDE 400 MG/5ML PO SUSP
30.0000 mL | Freq: Every day | ORAL | Status: DC
  Administered 2012-02-19 – 2012-02-20 (×2): 30 mL via ORAL
  Filled 2012-02-19 (×3): qty 30

## 2012-02-19 MED ORDER — BISACODYL 10 MG RE SUPP
10.0000 mg | Freq: Every day | RECTAL | Status: DC | PRN
  Administered 2012-02-20: 10 mg via RECTAL
  Filled 2012-02-19: qty 1

## 2012-02-19 NOTE — Evaluation (Signed)
Occupational Therapy Evaluation and Discharge Patient Details Name: Alisha [name] MRN: [id number] DOB: 05/02/1935 Today's Date: 02/19/2012 Time: 0923-1000 OT Time Calculation (min): 37 min  OT Assessment / Plan / Recommendation Clinical Impression  This 77 yo female s/p RTKA presents to acute OT with all education completed. No further OT needs, acute OT will sign off.    OT Assessment  Patient does not need any further OT services    Follow Up Recommendations  No OT follow up    Equipment Recommendations   (Pt to get tub bench on her own)    Frequency      Precautions / Restrictions Precautions Precautions: Knee Knee Immobilizer - Right: Discontinue post op day 2 Restrictions Weight Bearing Restrictions: Yes RLE Weight Bearing: Weight bearing as tolerated       ADL  Eating/Feeding: Simulated;Independent Where Assessed - Eating/Feeding: Chair Grooming: Performed;Set up;Wash/dry hands Where Assessed - Grooming: Standing at sink Upper Body Bathing: Simulated;Set up Where Assessed - Upper Body Bathing: Unsupported;Sitting, chair Lower Body Bathing: Moderate assistance (with min A sit to stand) Where Assessed - Lower Body Bathing: Supported;Sit to stand from chair Upper Body Dressing: Simulated;Set up Where Assessed - Upper Body Dressing: Unsupported;Sitting, chair Lower Body Dressing: Simulated;Maximal assistance Where Assessed - Lower Body Dressing: Supported;Sit to stand from chair (min A sit to stand) Toilet Transfer: Performed (min guard A) Toilet Transfer Method: Proofreader: Raised toilet seat with arms (or 3-in-1 over toilet) Toileting - Clothing Manipulation: Performed;Independent Where Assessed - Toileting Clothing Manipulation: Standing Toileting - Hygiene: Performed;Independent Where Assessed - Toileting Hygiene: Sit on 3-in-1 or toilet Tub/Shower Transfer: Performed;Minimal assistance Tub/Shower Transfer Method:  Science writer: Counsellor Used: Rolling walker Ambulation Related to ADLs: Min guard A ADL Comments: Grand daughter to help pt with BADLS until she can do them by herself    OT Goals    Visit Information  Last OT Received On: 02/19/12 Assistance Needed: +1 PT/OT Co-Evaluation/Treatment: Yes (partial)    Subjective Data  Subjective: " I had the other knee done just a little over a year ago" Patient Stated Goal: "I just need to look at how to do the tub transfer"   Prior Functioning  Home Living Lives With: Alone Available Help at Discharge: Family;Available 24 hours/day (grand daughter is coming to stay with her) Type of Home: House Home Access: Level entry Home Layout: One level Bathroom Shower/Tub: Forensic scientist: Standard How Accessible: Accessible via walker Home Adaptive Equipment: Grab bars in shower;Raised toilet seat with rails Prior Function Level of Independence: Independent Able to Take Stairs?: Yes Driving: Yes Vocation: Part time employment Engineering geologist ) Communication Communication: No difficulties Dominant Hand: Right    Cognition  Overall Cognitive Status: Appears within functional limits for tasks assessed/performed Arousal/Alertness: Awake/alert Orientation Level: Appears intact for tasks assessed Behavior During Session: [facility] for tasks performed    Extremity/Trunk Assessment Right Upper Extremity Assessment RUE ROM/Strength/Tone: Within functional levels Left Upper Extremity Assessment LUE ROM/Strength/Tone: Within functional levels   Mobility Transfers Transfers: Sit to Stand;Stand to Sit Sit to Stand: With upper extremity assist;With armrests;From chair/3-in-1;4: Min assist Stand to Sit: 4: Min guard;With armrests;With upper extremity assist;To chair/3-in-1 (VCs to put RLE out first)   Exercise    Balance    End of Session OT - End of Session Equipment Utilized During  Treatment: Gait belt Activity Tolerance: Patient tolerated treatment well Patient left:  (with PT walking pt back to her room)  Evette Georges [phone] 02/19/2012, 11:44 AM

## 2012-02-19 NOTE — Progress Notes (Addendum)
Pt given Milk of Mag per order for constipation. Ace dressing removed from R knee. Incision well approximated with staples. No s/sx infection noted. Ace dressing removed and tegaderm dressing applied after cleaning incision with normal saline per MD order. Pt tolerated well. Pt RLE has 1+ non pitting edema.

## 2012-02-19 NOTE — Progress Notes (Signed)
Subjective: Pt stable - moving well   Objective: Vital signs in last 24 hours: Temp:  [97.3 F (36.3 C)-99.6 F (37.6 C)] 99.6 F (37.6 C) (05/02 0610) Pulse Rate:  [62-75] 75  (05/02 0610) Resp:  [18-20] 18  (05/02 0610) BP: (128-153)/(47-59) 133/51 mmHg (05/02 0610) SpO2:  [93 %-99 %] 93 % (05/02 0610)  Intake/Output from previous day: 05/01 0701 - 05/02 0700 In: 2315 [I.V.:2315] Out: 650 [Urine:650] Intake/Output this shift:    Exam:  Neurovascular intact Sensation intact distally Intact pulses distally Dorsiflexion/Plantar flexion intact  Labs:  Basename 02/19/12 0610 02/18/12 0620  HGB 10.0* 10.5*    Basename 02/19/12 0610 02/18/12 0620  WBC 14.5* 13.3*  RBC 3.25* 3.40*  HCT 30.1* 31.6*  PLT 242 266    Basename 02/18/12 0620  NA 138  K 3.9  CL 103  CO2 25  BUN 8  CREATININE 0.68  GLUCOSE 121*  CALCIUM 8.1*    Basename 02/19/12 0610 02/18/12 0620  LABPT -- --  INR 1.66* 1.09    Assessment/Plan: Mobilize with PT - poss dc Saturday - CPM - INR near therapeutic -    Kvion Shapley SCOTT 02/19/2012, 7:52 AM

## 2012-02-19 NOTE — Consult Note (Signed)
ANTICOAGULATION CONSULT NOTE - Follow Up Consult  Pharmacy Consult for : Coumadin Indication: VTE prophylaxis  Allergies  Allergen Reactions  . Penicillins Itching and Rash  . Sulfa Antibiotics Itching and Rash    Patient Measurements: Height: 5\' 3"  (160 cm) Weight: 199 lb 1.2 oz (90.3 kg) IBW/kg (Calculated) : 52.4    Vital Signs: Temp: 99.6 F (37.6 C) (05/02 0610) BP: 133/51 mmHg (05/02 0610) Pulse Rate: 75  (05/02 0610)  Labs:  Basename 02/19/12 0610 02/18/12 0620 02/17/12 1351  HGB 10.0* 10.5* --  HCT 30.1* 31.6* --  PLT 242 266 --  APTT -- -- --  LABPROT 19.9* 14.3 13.4  INR 1.66* 1.09 1.00  HEPARINUNFRC -- -- --  CREATININE -- 0.68 --  CKTOTAL -- -- --  CKMB -- -- --  TROPONINI -- -- --   Estimated Creatinine Clearance: 64.8 ml/min (by C-G formula based on Cr of 0.68).   Medications:    hydrochlorothiazide 25 mg Oral Daily  levothyroxine 88 mcg Oral QAC breakfast  magnesium hydroxide 30 mL Oral Daily  spironolactone 25 mg Oral Daily  Travoprost (BAK Free) 1 drop Both Eyes QHS  warfarin 7.5 mg Oral ONCE-1800  warfarin  Does not apply Once  Warfarin - Pharmacist Dosing Inpatient  Does not apply q1800   Assessment:  Day # 2 post op R-TKA on Coumadin for VTE prophylaxis  INR  1  >>  1.09  >>  1.66.  No bleeding complications.  Goal of Therapy:   INR 2-3   Plan:   Coumadin 5 mg today.  Coumadin discharge teaching completed. Daily INR's, CBC.  Averi Cacioppo, Elisha Headland, Pharm.D. 02/19/2012 9:46 AM

## 2012-02-19 NOTE — Progress Notes (Signed)
CARE MANAGEMENT NOTE 02/19/2012  Patient:  Alisha [name], Alisha [name]   Account Number:  [id number]  Date Initiated:  02/19/2012  Documentation initiated by:  Vance Peper  Subjective/Objective Assessment:   77 yr old female s/p right total knee arthroplasty     Action/Plan:   Spoke with patient regarding Home Health needs. Choice offered, CPM has been delivered, has rolling walker and 3in1. Has family support.   Anticipated DC Date:  02/20/2012   Anticipated DC Plan:  HOME W HOME HEALTH SERVICES      DC Planning Services  CM consult      [facility] Choice  HOME HEALTH   Choice offered to / List presented to:  C-1 Patient        HH arranged  HH-2 PT  HH-1 RN      [facility] agency  [facility]   Status of service:  Completed, signed off  Discharge Disposition:  HOME W HOME HEALTH SERVICES

## 2012-02-19 NOTE — Progress Notes (Signed)
Orthopedic Tech Progress Note Patient Details:  Alisha [name] [birth year] [id number]  Patient ID: Alisha [name], female   DOB: 1935/02/03, 77 y.o.   MRN: [id number]  Confirmed pt has knee immobilizer in room. Audric Venn T 02/19/2012, 1:33 PM

## 2012-02-19 NOTE — Discharge Instructions (Signed)
Home Health to be provided by [facility] [phone] 

## 2012-02-19 NOTE — Progress Notes (Signed)
Physical Therapy Treatment Patient Details Name: Alisha [name] MRN: [id number] DOB: 03-24-1935 Today's Date: 02/19/2012 Time: 4098-1191 PT Time Calculation (min): 31 min  PT Assessment / Plan / Recommendation Comments on Treatment Session  Pt admitted s/p right TKA and is motivated to progress.  Able to tolerate ambulation BID as well as therapeutic exercises this pm.    Follow Up Recommendations  Home health PT    Equipment Recommendations  None recommended by PT    Frequency 7X/week   Plan Discharge plan remains appropriate;Frequency remains appropriate    Precautions / Restrictions Precautions Precautions: Knee Precaution Booklet Issued: No Required Braces or Orthoses: Knee Immobilizer - Right Knee Immobilizer - Right:  (D/C'ed today.) Restrictions Weight Bearing Restrictions: Yes RLE Weight Bearing: Weight bearing as tolerated    Pertinent Vitals/Pain 5/10 in right knee.  RN made aware and medication given.    Mobility  Bed Mobility Bed Mobility: Sit to Supine Sit to Supine: 4: Min assist Details for Bed Mobility Assistance: Assist for right LE due to pain with cues for sequence. Transfers Transfers: Sit to Stand;Stand to Sit (2 trials.) Sit to Stand: 4: Min guard;With upper extremity assist;From chair/3-in-1 Stand to Sit: 4: Min guard;With upper extremity assist;To chair/3-in-1;To bed Details for Transfer Assistance: Guarding for balance with cues for hand/right LE placement. Ambulation/Gait Ambulation/Gait Assistance: 5: Supervision Ambulation Distance (Feet): 40 Feet (20 feet x 2 trials.) Assistive device: Rolling walker Ambulation/Gait Assistance Details: Verbal cues for tall posture and sequence. Gait Pattern: Step-to pattern;Trunk flexed Gait velocity: Slow and steady. Stairs: No Wheelchair Mobility Wheelchair Mobility: No    Exercises Total Joint Exercises Ankle Circles/Pumps: AROM;Right;10 reps;Supine Quad Sets: AROM;Right;10 reps;Supine Short Arc  Quad: AAROM;Right;10 reps;Supine Heel Slides: AAROM;Right;10 reps;Supine Hip ABduction/ADduction: AROM;Right;10 reps;Supine Straight Leg Raises: AAROM;Right;10 reps;Supine Knee Flexion: AAROM;Right;Supine (Right knee AA/ROM 0-50 degrees.)   PT Goals Acute Rehab PT Goals PT Goal Formulation: With patient Time For Goal Achievement: 02/25/12 Potential to Achieve Goals: Good PT Goal: Sit to Stand - Progress: Progressing toward goal PT Goal: Ambulate - Progress: Progressing toward goal PT Goal: Perform Home Exercise Program - Progress: Progressing toward goal  Visit Information  Last PT Received On: 02/19/12 Assistance Needed: +1 PT/OT Co-Evaluation/Treatment: Yes    Subjective Data  Subjective: "I thought you might come." Patient Stated Goal: Back home Independent, back to work   Cognition  Overall Cognitive Status: Appears within functional limits for tasks assessed/performed Arousal/Alertness: Awake/alert Orientation Level: Appears intact for tasks assessed Behavior During Session: [facility] for tasks performed    Balance  Balance Balance Assessed: No  End of Session PT - End of Session Equipment Utilized During Treatment: Gait belt Activity Tolerance: Patient tolerated treatment well Patient left: in bed;in CPM;with call bell/phone within reach (CPM 0-40 degrees.) Nurse Communication: Mobility status    Cephus Shelling 02/19/2012, 2:43 PM  02/19/2012 Cephus Shelling, PT, DPT [phone]

## 2012-02-19 NOTE — Progress Notes (Signed)
Referral received for SNF. Chart reviewed and CSW has spoken with RNCM who indicates that patient is for DC to home with Home Health and DME.  CSW to sign off. Please re-consult if CSW needs arise.  Alisha [name] T. Umar Patmon, BSW  [phone]  

## 2012-02-19 NOTE — H&P (Signed)
NAME:  Alisha [name], Alisha [name] NO.:  [id number]  MEDICAL RECORD NO.:  [id number]  LOCATION:                                 FACILITY:  PHYSICIAN:  Burnard Bunting, M.D.    DATE OF BIRTH:  [birth year]  DATE OF ADMISSION: DATE OF DISCHARGE:                             HISTORY & PHYSICAL   CHIEF COMPLAINT:  Right knee pain.  HISTORY OF PRESENT ILLNESS:  Favour is a 77 year old patient who underwent left total knee replacement on January 28, 2011.  She is doing well with that.  She has occasional pain on the left knee but she has good movement.  She wants to get the right knee done.  She is having fairly incapacitating right knee pain with pain with ADLs, night pain, rest pain, the pain that keeps her from doing the things she wants to do.  She localizes the pain to the medial aspect of her knee.  No real mechanical symptoms.  She has had failure of conservative management, including home exercise program, medication, and activity avoidance. She states that her health is otherwise good.  CURRENT MEDICATIONS: 1. Travatan ophthalmic solution. 2. Aldactone 25 mg daily. 3. Synthroid 88 mcg by mouth daily. 4. Hydrochlorothiazide 25 mg daily. 5. Neurontin 300 mg 3 times daily as needed for itching. 6. Estrace 0.5 mg by mouth daily.  She is allergic to penicillin and sulfa.  _________ diabetes, high blood pressure.  She is single.  She works as a Engineer, structural.  No family history of DVT.  PHYSICAL EXAMINATION:  GENERAL:  Well developed, well nourished, in no acute distress.  Alert, oriented.  Normal body mass index.  Normal gait, alignment. CHEST:  Clear to auscultation. HEART:  Regular rate and rhythm. ABDOMEN:  Benign. EXTREMITIES:  Right knee demonstrates range of motion from about 8 degrees to about not quite to 90 degrees.  She has palpable pedal pulses.  Intact skin over the knee.  Extensor mechanism is intact.  Radiographs show end-stage tricompartmental knee  arthritis, worse in the medial compartment.  IMPRESSION:  Right knee arthritis.  PLAN:  Right total knee replacement.  Risks and benefits were discussed with the patient, including but not limited to infection, nerve and vessel damage, incomplete pain relief, deep vein thrombosis, death, and need for more surgery.  All questions were answered.  The patient had good result with her left knee and desires right total knee replacement which I think is reasonable based on her described symptoms.  Medical decision making was complicated by decision for surgery.  All questions were answered.     Burnard Bunting, M.D.     GSD/MEDQ  D:  02/16/2012  T:  02/16/2012  Job:  [id number]

## 2012-02-19 NOTE — Progress Notes (Signed)
Physical Therapy Treatment Patient Details Name: Alisha [name] MRN: [id number] DOB: 17-Aug-1935 Today's Date: 02/19/2012 Time: 1308-6578 PT Time Calculation (min): 47 min  PT Assessment / Plan / Recommendation Comments on Treatment Session  Pt admitted s/p right TKA and continues to progress.  Pt able to increase ambulation distance significantly as well as tolerate multiple transfers with increasing independence.    Follow Up Recommendations  Home health PT    Equipment Recommendations  None recommended by PT    Frequency 7X/week   Plan Discharge plan remains appropriate;Frequency remains appropriate    Precautions / Restrictions Precautions Precautions: Knee Precaution Booklet Issued: No Required Braces or Orthoses: Knee Immobilizer - Right Knee Immobilizer - Right:  (D/C'ed today.) Restrictions Weight Bearing Restrictions: Yes RLE Weight Bearing: Weight bearing as tolerated    Pertinent Vitals/Pain 8/10 in right knee.  Pt premedicated and repositioned.    Mobility  Bed Mobility Bed Mobility: Not assessed Transfers Transfers: Sit to Stand;Stand to Sit (3 trials each.) Sit to Stand: 4: Min assist;With upper extremity assist;From chair/3-in-1 (With pt able to progress to min (guard)) Stand to Sit: 4: Min guard;With upper extremity assist;To chair/3-in-1 Details for Transfer Assistance: Assist to translate trunk anterior over BOS with cues for hand/right LE placement as well as slow descent. Ambulation/Gait Ambulation/Gait Assistance: 5: Supervision Ambulation Distance (Feet): 190 Feet (7feet x 2 trials and 150 feet.) Assistive device: Rolling walker Ambulation/Gait Assistance Details: Verbal cues for tall posture as well as initial contact on right heel to progress to step-through sequence. Gait Pattern: Step-to pattern;Trunk flexed Gait velocity: Slow and steady. Stairs: No Wheelchair Mobility Wheelchair Mobility: No    Exercises     PT Goals Acute Rehab PT  Goals PT Goal Formulation: With patient Time For Goal Achievement: 02/25/12 Potential to Achieve Goals: Good PT Goal: Sit to Stand - Progress: Progressing toward goal PT Goal: Ambulate - Progress: Progressing toward goal  Visit Information  Last PT Received On: 02/19/12 Assistance Needed: +1 PT/OT Co-Evaluation/Treatment: Yes    Subjective Data  Subjective: "I am doing just fine today." Patient Stated Goal: Back home Independent, back to work   Cognition  Overall Cognitive Status: Appears within functional limits for tasks assessed/performed Arousal/Alertness: Awake/alert Orientation Level: Appears intact for tasks assessed Behavior During Session: [facility] for tasks performed    Balance  Balance Balance Assessed: No  End of Session PT - End of Session Equipment Utilized During Treatment: Gait belt Activity Tolerance: Patient tolerated treatment well Patient left: in chair;with call bell/phone within reach Nurse Communication: Mobility status    Cephus Shelling 02/19/2012, 11:52 AM  02/19/2012 Cephus Shelling, PT, DPT [phone]

## 2012-02-20 DIAGNOSIS — M1710 Unilateral primary osteoarthritis, unspecified knee: Secondary | ICD-10-CM | POA: Insufficient documentation

## 2012-02-20 LAB — URINALYSIS, ROUTINE W REFLEX MICROSCOPIC
Bilirubin Urine: NEGATIVE
Glucose, UA: NEGATIVE mg/dL
Ketones, ur: 15 mg/dL — AB
Nitrite: POSITIVE — AB
Protein, ur: NEGATIVE mg/dL
Specific Gravity, Urine: 1.011 (ref 1.005–1.030)
Urobilinogen, UA: 1 mg/dL (ref 0.0–1.0)
pH: 6.5 (ref 5.0–8.0)

## 2012-02-20 LAB — CBC
HCT: 29.6 % — ABNORMAL LOW (ref 36.0–46.0)
Hemoglobin: 9.8 g/dL — ABNORMAL LOW (ref 12.0–15.0)
MCH: 30.4 pg (ref 26.0–34.0)
MCHC: 33.1 g/dL (ref 30.0–36.0)
MCV: 91.9 fL (ref 78.0–100.0)
Platelets: 243 10*3/uL (ref 150–400)
RBC: 3.22 MIL/uL — ABNORMAL LOW (ref 3.87–5.11)
RDW: 13 % (ref 11.5–15.5)
WBC: 14.5 10*3/uL — ABNORMAL HIGH (ref 4.0–10.5)

## 2012-02-20 LAB — PROTIME-INR
INR: 1.6 — ABNORMAL HIGH (ref 0.00–1.49)
Prothrombin Time: 19.3 seconds — ABNORMAL HIGH (ref 11.6–15.2)

## 2012-02-20 LAB — URINE MICROSCOPIC-ADD ON

## 2012-02-20 MED ORDER — CIPROFLOXACIN HCL 500 MG PO TABS
500.0000 mg | ORAL_TABLET | Freq: Two times a day (BID) | ORAL | Status: DC
  Administered 2012-02-20 – 2012-02-21 (×3): 500 mg via ORAL
  Filled 2012-02-20 (×4): qty 1

## 2012-02-20 MED ORDER — MAGNESIUM HYDROXIDE 400 MG/5ML PO SUSP: 30.0000 mL | Freq: Every day | ORAL | Status: DC | PRN

## 2012-02-20 MED ORDER — CIPROFLOXACIN HCL 500 MG PO TABS
500.0000 mg | ORAL_TABLET | Freq: Two times a day (BID) | ORAL | Status: DC
  Filled 2012-02-20 (×3): qty 1

## 2012-02-20 MED ORDER — OXYCODONE HCL 10 MG PO TABS: 10.0000 mg | ORAL_TABLET | ORAL | Status: AC | PRN

## 2012-02-20 MED ORDER — WARFARIN SODIUM 5 MG PO TABS: 5.0000 mg | ORAL_TABLET | Freq: Every day | ORAL | Status: DC

## 2012-02-20 MED ORDER — WARFARIN SODIUM 7.5 MG PO TABS
7.5000 mg | ORAL_TABLET | Freq: Once | ORAL | Status: AC
  Administered 2012-02-20: 7.5 mg via ORAL
  Filled 2012-02-20: qty 1

## 2012-02-20 NOTE — Consult Note (Signed)
ANTICOAGULATION CONSULT NOTE - Follow Up Consult  Pharmacy Consult for : Coumadin Indication: VTE prophylaxis  Allergies  Allergen Reactions  . Penicillins Itching and Rash  . Sulfa Antibiotics Itching and Rash    Patient Measurements: Height: 5\' 3"  (160 cm) Weight: 199 lb 1.2 oz (90.3 kg) IBW/kg (Calculated) : 52.4    Vital Signs: Temp: 99.4 F (37.4 C) (05/03 0538) BP: 151/55 mmHg (05/03 0538) Pulse Rate: 77  (05/03 0538)  Labs:  Basename 02/20/12 0655 02/19/12 0610 02/18/12 0620  HGB 9.8* 10.0* --  HCT 29.6* 30.1* 31.6*  PLT 243 242 266  APTT -- -- --  LABPROT 19.3* 19.9* 14.3  INR 1.60* 1.66* 1.09  HEPARINUNFRC -- -- --  CREATININE -- -- 0.68  CKTOTAL -- -- --  CKMB -- -- --  TROPONINI -- -- --   Estimated Creatinine Clearance: 64.8 ml/min (by C-G formula based on Cr of 0.68).   Medications:    hydrochlorothiazide 25 mg Oral Daily  levothyroxine 88 mcg Oral QAC breakfast  spironolactone 25 mg Oral Daily  Travoprost (BAK Free) 1 drop Both Eyes QHS  warfarin 5 mg Oral ONCE-1800  Warfarin - Pharmacist Dosing Inpatient  Does not apply q1800  DISCONTD: magnesium hydroxide 30 mL Oral Daily   Assessment:  Day # 3 post op R-TKA on Coumadin for VTE prophylaxis  INR  1 >> 1.09 >> 1.66 >> 1.6.  No bleeding complications.  Goal of Therapy:   INR 2-3   Plan:   Coumadin 7.5 mg today.  [place] Straus, Elisha Headland, Pharm.D. 02/20/2012 11:28 AM

## 2012-02-20 NOTE — Progress Notes (Signed)
PHARMACIST - PHYSICIAN COMMUNICATION DR:   Floyde Parkins.  CONCERNING:  Pt on Cipro 500mg  bid x 2 days for UTI.  The usual treatment duration is 3 days for uncomplicated UTI.     RECOMMENDATION: Consider adding another day of therapy.  Marisue Humble, PharmD Clinical Pharmacist [facility]

## 2012-02-20 NOTE — Progress Notes (Signed)
Agree with treatment session.  02/20/2012 Khylee Algeo M Donavon Kimrey, PT, DPT [phone]   

## 2012-02-20 NOTE — Progress Notes (Signed)
Physical Therapy Treatment Patient Details Name: Alisha [name] MRN: [id number] DOB: 05-17-1935 Today's Date: 02/20/2012 Time: 4098-1191 PT Time Calculation (min): 53 min  PT Assessment / Plan / Recommendation Comments on Treatment Session  Pt continues to improve with mobility, strength and ROM of  R knee. Pt is functionally appropriate for discharge to home tomorrow.    Follow Up Recommendations  Home health PT    Equipment Recommendations  None recommended by PT    Frequency 7X/week   Plan Discharge plan remains appropriate;Frequency remains appropriate    Precautions / Restrictions Precautions Precautions: Knee Precaution Booklet Issued: No Restrictions Weight Bearing Restrictions: Yes RLE Weight Bearing: Weight bearing as tolerated    Pertinent Vitals/Pain Pt reports no pain prior to session and no pain at end of session.     Mobility  Bed Mobility Bed Mobility: Supine to Sit;Sit to Supine Supine to Sit: 5: Supervision;HOB flat Sitting - Scoot to Edge of Bed: 7: Independent Sit to Supine: 4: Min assist Details for Bed Mobility Assistance: Min assist for R LE secondary to surgical pain.  Transfers Transfers: Sit to Stand;Stand to Sit Sit to Stand: 6: Modified independent (Device/Increase time);From bed;With upper extremity assist;With armrests;From chair/3-in-1 Stand to Sit: To chair/3-in-1;To bed;With upper extremity assist;5: Supervision Details for Transfer Assistance: Cues for positioning of RLE to minimize pain.  Pt c/o Rt shoulder pain.   Ambulation/Gait Ambulation/Gait Assistance: Not tested (comment) Wheelchair Mobility Wheelchair Mobility: No    Exercises Total Joint Exercises Ankle Circles/Pumps: AROM;Right;10 reps;Seated Quad Sets: AROM;Right;10 reps;Seated Short Arc Quad: AROM;Right;10 reps;Supine;Strengthening (5 second hold) Heel Slides: AROM;Right;10 reps;AAROM;Supine Hip ABduction/ADduction: AROM;Right;10 reps;Supine Straight Leg Raises:  AROM;Right;10 reps;Supine;AAROM (5 reps AAROM 5 reps AROM) Knee Flexion: AAROM;Right;Seated (R knee 0-68 degrees)   PT Goals Acute Rehab PT Goals PT Goal Formulation: With patient Time For Goal Achievement: 02/25/12 Potential to Achieve Goals: Good Pt will go Supine/Side to Sit: with modified independence PT Goal: Supine/Side to Sit - Progress: Progressing toward goal Pt will go Sit to Stand: with supervision PT Goal: Sit to Stand - Progress: Met Pt will Transfer Bed to Chair/Chair to Bed: with supervision PT Transfer Goal: Bed to Chair/Chair to Bed - Progress: Met Pt will Ambulate: >150 feet;with modified independence;with least restrictive assistive device;with rolling walker Pt will Perform Home Exercise Program: with supervision, verbal cues required/provided PT Goal: Perform Home Exercise Program - Progress: Progressing toward goal  Visit Information  Last PT Received On: 02/20/12 Assistance Needed: +1    Subjective Data  Subjective: "I'm feeling good today." Patient Stated Goal: Back home Independent, back to work   Cognition  Overall Cognitive Status: Appears within functional limits for tasks assessed/performed Arousal/Alertness: Awake/alert Orientation Level: Appears intact for tasks assessed Behavior During Session: [facility] for tasks performed    Balance  Balance Balance Assessed: No  End of Session PT - End of Session Equipment Utilized During Treatment: Gait belt Activity Tolerance: Patient tolerated treatment well Patient left: in chair;with call bell/phone within reach Nurse Communication: Mobility status    Thersa Mohiuddin 02/20/2012, 4:59 PM Maelynn Moroney L. Julen Rubert DPT [phone]

## 2012-02-20 NOTE — Progress Notes (Signed)
Subjective: Pt stable - frequent urination   Objective: Vital signs in last 24 hours: Temp:  [98.1 F (36.7 C)-99.7 F (37.6 C)] 99.4 F (37.4 C) (05/03 0538) Pulse Rate:  [69-81] 77  (05/03 0538) Resp:  [16-18] 18  (05/03 0538) BP: (129-151)/(47-55) 151/55 mmHg (05/03 0538) SpO2:  [97 %-100 %] 100 % (05/03 0538)  Intake/Output from previous day: 05/02 0701 - 05/03 0700 In: 360 [P.O.:360] Out: -  Intake/Output this shift:    Exam:  Neurovascular intact Sensation intact distally Intact pulses distally  Labs:  Basename 02/20/12 0655 02/19/12 0610 02/18/12 0620  HGB 9.8* 10.0* 10.5*    Basename 02/20/12 0655 02/19/12 0610  WBC 14.5* 14.5*  RBC 3.22* 3.25*  HCT 29.6* 30.1*  PLT 243 242    Basename 02/18/12 0620  NA 138  K 3.9  CL 103  CO2 25  BUN 8  CREATININE 0.68  GLUCOSE 121*  CALCIUM 8.1*    Basename 02/20/12 0655 02/19/12 0610  LABPT -- --  INR 1.60* 1.66*    Assessment/Plan: Plan for dc am - check ua today   Jamal Pavon SCOTT 02/20/2012, 8:11 AM

## 2012-02-20 NOTE — Progress Notes (Signed)
Physical Therapy Treatment Patient Details Name: Sherrol Vicars Burgner MRN: [id number] DOB: 02-10-1935 Today's Date: 02/20/2012 Time: 4098-1191 PT Time Calculation (min): 35 min  PT Assessment / Plan / Recommendation Comments on Treatment Session  Pt admitted s/p R TKA and is progressing well. Tolerated ambulating greater distance. Will continue to benefit from PT to facilitate D/C home for HHPT.    Follow Up Recommendations  Home health PT    Equipment Recommendations  None recommended by PT    Frequency 7X/week   Plan Discharge plan remains appropriate;Frequency remains appropriate    Precautions / Restrictions Precautions Precautions: Knee Precaution Booklet Issued: No Restrictions Weight Bearing Restrictions: Yes RLE Weight Bearing: Weight bearing as tolerated   Pertinent Vitals/Pain N/A     Mobility  Bed Mobility Bed Mobility: Not assessed Transfers Transfers: Sit to Stand;Stand to Sit Sit to Stand: 4: Min guard;With upper extremity assist;From chair/3-in-1 Stand to Sit: 4: Min guard;With upper extremity assist;To chair/3-in-1 Details for Transfer Assistance: Guarding for balance. Cueing for placement of R LE.  Ambulation/Gait Ambulation/Gait Assistance: 5: Supervision Ambulation Distance (Feet): 100 Feet Assistive device: Rolling walker Ambulation/Gait Assistance Details: Verbal cues for step-through gait pattern.  Gait Pattern: Step-through pattern (Working on this today. ) Stairs: No Corporate treasurer: No    Exercises Total Joint Exercises Ankle Circles/Pumps: AROM;Right;10 reps;Seated Quad Sets: AROM;Right;10 reps;Seated Short Arc Quad: AROM;Right;10 reps;Seated Heel Slides: AROM;Right;10 reps;Seated Hip ABduction/ADduction: AROM;Right;10 reps;Seated Straight Leg Raises: AROM;Right;10 reps;Seated Knee Flexion: AAROM;Right;Seated (R knee 0-52 degrees)   PT Goals Acute Rehab PT Goals PT Goal Formulation: With patient Time For Goal  Achievement: 02/25/12 Potential to Achieve Goals: Good PT Goal: Sit to Stand - Progress: Progressing toward goal PT Transfer Goal: Bed to Chair/Chair to Bed - Progress: Progressing toward goal PT Goal: Ambulate - Progress: Progressing toward goal PT Goal: Perform Home Exercise Program - Progress: Progressing toward goal  Visit Information  Last PT Received On: 02/20/12 Assistance Needed: +1    Subjective Data  Subjective: "I'm feeling good today." Patient Stated Goal: Back home Independent, back to work   Cognition  Overall Cognitive Status: Appears within functional limits for tasks assessed/performed Arousal/Alertness: Awake/alert Orientation Level: Appears intact for tasks assessed Behavior During Session: [facility] for tasks performed    Balance  Balance Balance Assessed: No  End of Session PT - End of Session Equipment Utilized During Treatment: Gait belt Activity Tolerance: Patient tolerated treatment well Patient left: in chair;with call bell/phone within reach Nurse Communication: Mobility status    Oretha Ellis 02/20/2012, 12:19 PM

## 2012-02-20 NOTE — Progress Notes (Signed)
Pt is s/p R TKR. Knee precautions. +CMS. Pt has a gauze tegaderm dressing that has a small stain of dried red blood. Dressing was placed yesterday. Pt repts that during the night she noted increased urinary freq. Pt denies incontinence, dysuria or inability to empty her bladder. Will rept to Dr. August Saucer on rounds and obtain an order for UA and C and S. Pt also repts being "constipated". Pt abdomen is soft flat nontender and nondistended. BS+x4. Pt repts passing gas and denies nausea or vomiting. Pt received MOM yesterday and will repeat MOM this AM per order and suppository in the afternoon if needed.  Pt repts LBM 4/29. Pt heart rate regular rate and rhythm. Lungs noted to have a fine exp wheeze of the upper lobes and CTA in the bases. Pt repts a history of "asthma". Pt repts nonprod cough. Pt performs IS per order. No s/sx cardiac or resp distress and no c/o such. Pt sats 100% RA.

## 2012-02-21 LAB — PROTIME-INR
INR: 1.73 — ABNORMAL HIGH (ref 0.00–1.49)
Prothrombin Time: 20.6 seconds — ABNORMAL HIGH (ref 11.6–15.2)

## 2012-02-21 MED ORDER — CIPROFLOXACIN HCL 500 MG PO TABS: 500.0000 mg | ORAL_TABLET | Freq: Two times a day (BID) | ORAL | Status: AC

## 2012-02-21 NOTE — Progress Notes (Signed)
Physical Therapy Treatment Patient Details Name: Alisha [name] MRN: [id number] DOB: Jun 24, 1935 Today's Date: 02/21/2012 Time: 5284-1324 PT Time Calculation (min): 33 min  PT Assessment / Plan / Recommendation Comments on Treatment Session  Pt admitted s/p right TKA and continues to improve.  Pt ready for safe d/c home once medically cleared by MD.    Follow Up Recommendations  Home health PT    Equipment Recommendations  None recommended by PT    Frequency 7X/week   Plan Discharge plan remains appropriate;Frequency remains appropriate    Precautions / Restrictions Precautions Precautions: Knee Precaution Booklet Issued: No Restrictions Weight Bearing Restrictions: Yes RLE Weight Bearing: Weight bearing as tolerated    Pertinent Vitals/Pain N/A    Mobility  Bed Mobility Bed Mobility: Not assessed Transfers Transfers: Sit to Stand;Stand to Sit Sit to Stand: 6: Modified independent (Device/Increase time);From bed;With upper extremity assist;With armrests;From chair/3-in-1 Stand to Sit: 6: Modified independent (Device/Increase time) Ambulation/Gait Ambulation/Gait Assistance: 5: Supervision Ambulation Distance (Feet): 100 Feet Assistive device: Rolling walker Ambulation/Gait Assistance Details: Verbal cues for continued step-through sequence with tall posture tucking bottom under trunk. Gait Pattern: Step-through pattern Gait velocity: Slow and steady. Stairs: No Wheelchair Mobility Wheelchair Mobility: No    Exercises Total Joint Exercises Ankle Circles/Pumps: AROM;Right;10 reps;Seated Quad Sets: AROM;Right;10 reps;Seated Short Arc Quad: AROM;Right;10 reps;Seated Heel Slides: AROM;Right;10 reps;Seated Hip ABduction/ADduction: AROM;Right;10 reps;Seated Straight Leg Raises: AROM;Right;10 reps;Seated Knee Flexion: AAROM;Right;Seated (R knee 0-63 degrees.)   PT Goals Acute Rehab PT Goals PT Goal Formulation: With patient Time For Goal Achievement:  02/25/12 Potential to Achieve Goals: Good PT Goal: Sit to Stand - Progress: Met PT Goal: Ambulate - Progress: Progressing toward goal PT Goal: Perform Home Exercise Program - Progress: Progressing toward goal  Visit Information  Last PT Received On: 02/21/12 Assistance Needed: +1    Subjective Data  Subjective: "I am going home today." Patient Stated Goal: Back home Independent, back to work   Cognition  Overall Cognitive Status: Appears within functional limits for tasks assessed/performed Arousal/Alertness: Awake/alert Orientation Level: Appears intact for tasks assessed Behavior During Session: [facility] for tasks performed    Balance  Balance Balance Assessed: No  End of Session PT - End of Session Equipment Utilized During Treatment: Gait belt Activity Tolerance: Patient tolerated treatment well Patient left: in chair;with call bell/phone within reach Nurse Communication: Mobility status    Cephus Shelling 02/21/2012, 9:34 AM  02/21/2012 Cephus Shelling, PT, DPT [phone]

## 2012-02-21 NOTE — Progress Notes (Signed)
Subjective: Pt doing well urinary frequency decreasing   Objective: Vital signs in last 24 hours: Temp:  [98.9 F (37.2 C)-99.4 F (37.4 C)] 98.9 F (37.2 C) (05/04 0628) Pulse Rate:  [62-66] 62  (05/04 0628) Resp:  [16-18] 18  (05/04 0628) BP: (129-135)/(43-45) 129/45 mmHg (05/04 0628) SpO2:  [99 %-100 %] 99 % (05/04 0628)  Intake/Output from previous day: 05/03 0701 - 05/04 0700 In: 840 [P.O.:840] Out: -  Intake/Output this shift:    Exam:  Neurovascular intact Sensation intact distally Intact pulses distally  Labs:  Basename 02/20/12 0655 02/19/12 0610  HGB 9.8* 10.0*    Basename 02/20/12 0655 02/19/12 0610  WBC 14.5* 14.5*  RBC 3.22* 3.25*  HCT 29.6* 30.1*  PLT 243 242   No results found for this basename: NA:2,K:2,CL:2,CO2:2,BUN:2,CREATININE:2,GLUCOSE:2,CALCIUM:2 in the last 72 hours  Basename 02/21/12 0530 02/20/12 0655  LABPT -- --  INR 1.73* 1.60*    Assessment/Plan: Dc today - cipro for 3 more days   Alisha [name] 02/21/2012, 8:27 AM

## 2012-02-21 NOTE — Plan of Care (Signed)
Problem: Phase III Progression Outcomes Goal: Anticoagulant follow-up in place Outcome: Completed/Met Date Met:  02/21/12 Set up with [facility]

## 2012-02-22 LAB — URINE CULTURE
Colony Count: >=100000
Culture  Setup Time: 201305031113

## 2012-02-26 ENCOUNTER — Other Ambulatory Visit: Payer: Self-pay | Admitting: Internal Medicine

## 2012-02-26 DIAGNOSIS — R52 Pain, unspecified: Secondary | ICD-10-CM

## 2012-02-26 DIAGNOSIS — R609 Edema, unspecified: Secondary | ICD-10-CM

## 2012-03-02 ENCOUNTER — Ambulatory Visit (HOSPITAL_COMMUNITY)
Admission: RE | Admit: 2012-03-02 | Discharge: 2012-03-02 | Disposition: A | Discharge status: Home / Self Care | Payer: Medicare Other | Source: Ambulatory Visit | Attending: Internal Medicine | Admitting: Internal Medicine

## 2012-03-02 DIAGNOSIS — M542 Cervicalgia: Secondary | ICD-10-CM | POA: Insufficient documentation

## 2012-03-02 DIAGNOSIS — R52 Pain, unspecified: Secondary | ICD-10-CM | POA: Insufficient documentation

## 2012-03-02 DIAGNOSIS — E049 Nontoxic goiter, unspecified: Secondary | ICD-10-CM | POA: Insufficient documentation

## 2012-03-02 DIAGNOSIS — Z9889 Other specified postprocedural states: Secondary | ICD-10-CM | POA: Insufficient documentation

## 2012-03-02 DIAGNOSIS — Z79899 Other long term (current) drug therapy: Secondary | ICD-10-CM | POA: Insufficient documentation

## 2012-03-02 DIAGNOSIS — R609 Edema, unspecified: Secondary | ICD-10-CM

## 2012-03-09 NOTE — Discharge Summary (Signed)
Physician Discharge Summary  Patient ID: Alisha [name] MRN: [id number] DOB/AGE: Apr 01, 1935 77 y.o.  Admit date: 02/17/2012 Discharge date: 02/22/2012 Admission Diagnoses:  Knee arthritis  Discharge Diagnoses:  Same  Surgeries: Procedure(s): COMPUTER ASSISTED TOTAL KNEE ARTHROPLASTY on 02/17/2012   Consultants:    Discharged Condition: Stable  Hospital Course: Alisha [name] is an 77 y.o. female who was admitted 02/17/2012 with a chief complaint of knee pain, and found to have a diagnosis of knee arthritis.  They were brought to the operating room on 02/17/2012 and underwent the above named procedures.    Antibiotics given:  Anti-infectives     Start     Dose/Rate Route Frequency Ordered Stop   02/21/12 0000   ciprofloxacin (CIPRO) 500 MG tablet        500 mg Oral 2 times daily 02/21/12 0825 02/24/12 2359   02/20/12 1245   ciprofloxacin (CIPRO) tablet 500 mg  Status:  Discontinued        500 mg Oral 2 times daily 02/20/12 1154 02/20/12 1210   02/20/12 1245   ciprofloxacin (CIPRO) tablet 500 mg  Status:  Discontinued        500 mg Oral 2 times daily 02/20/12 1211 02/21/12 1350   02/17/12 1600   clindamycin (CLEOCIN) IVPB 600 mg        600 mg 100 mL/hr over 30 Minutes Intravenous 3 times per day 02/17/12 1325 02/18/12 0630   02/17/12 0600   clindamycin (CLEOCIN) IVPB 600 mg        600 mg 100 mL/hr over 30 Minutes Intravenous 60 min pre-op 02/16/12 1341 02/17/12 0751        .  Recent vital signs:  Filed Vitals:   02/21/12 0628  BP: 129/45  Pulse: 62  Temp: 98.9 F (37.2 C)  Resp: 18    Recent laboratory studies:  Results for orders placed during the hospital encounter of 02/17/12  PROTIME-INR      Component Value Range   Prothrombin Time 13.4  11.6 - 15.2 (seconds)   INR 1.00  0.00 - 1.49   PROTIME-INR      Component Value Range   Prothrombin Time 14.3  11.6 - 15.2 (seconds)   INR 1.09  0.00 - 1.49   CBC      Component Value Range   WBC 13.3 (*) 4.0 -  10.5 (K/uL)   RBC 3.40 (*) 3.87 - 5.11 (MIL/uL)   Hemoglobin 10.5 (*) 12.0 - 15.0 (g/dL)   HCT 40.9 (*) 81.1 - 46.0 (%)   MCV 92.9  78.0 - 100.0 (fL)   MCH 30.9  26.0 - 34.0 (pg)   MCHC 33.2  30.0 - 36.0 (g/dL)   RDW 91.4  78.2 - 95.6 (%)   Platelets 266  150 - 400 (K/uL)  BASIC METABOLIC PANEL      Component Value Range   Sodium 138  135 - 145 (mEq/L)   Potassium 3.9  3.5 - 5.1 (mEq/L)   Chloride 103  96 - 112 (mEq/L)   CO2 25  19 - 32 (mEq/L)   Glucose, Bld 121 (*) 70 - 99 (mg/dL)   BUN 8  6 - 23 (mg/dL)   Creatinine, Ser 2.13  0.50 - 1.10 (mg/dL)   Calcium 8.1 (*) 8.4 - 10.5 (mg/dL)   GFR calc non Af Amer 83 (*) >90 (mL/min)   GFR calc Af Amer >90  >90 (mL/min)  PROTIME-INR      Component Value Range   Prothrombin Time  19.9 (*) 11.6 - 15.2 (seconds)   INR 1.66 (*) 0.00 - 1.49   CBC      Component Value Range   WBC 14.5 (*) 4.0 - 10.5 (K/uL)   RBC 3.25 (*) 3.87 - 5.11 (MIL/uL)   Hemoglobin 10.0 (*) 12.0 - 15.0 (g/dL)   HCT 40.9 (*) 81.1 - 46.0 (%)   MCV 92.6  78.0 - 100.0 (fL)   MCH 30.8  26.0 - 34.0 (pg)   MCHC 33.2  30.0 - 36.0 (g/dL)   RDW 91.4  78.2 - 95.6 (%)   Platelets 242  150 - 400 (K/uL)  PROTIME-INR      Component Value Range   Prothrombin Time 19.3 (*) 11.6 - 15.2 (seconds)   INR 1.60 (*) 0.00 - 1.49   CBC      Component Value Range   WBC 14.5 (*) 4.0 - 10.5 (K/uL)   RBC 3.22 (*) 3.87 - 5.11 (MIL/uL)   Hemoglobin 9.8 (*) 12.0 - 15.0 (g/dL)   HCT 21.3 (*) 08.6 - 46.0 (%)   MCV 91.9  78.0 - 100.0 (fL)   MCH 30.4  26.0 - 34.0 (pg)   MCHC 33.1  30.0 - 36.0 (g/dL)   RDW 57.8  46.9 - 62.9 (%)   Platelets 243  150 - 400 (K/uL)  URINALYSIS, ROUTINE W REFLEX MICROSCOPIC      Component Value Range   Color, Urine YELLOW  YELLOW    APPearance CLOUDY (*) CLEAR    Specific Gravity, Urine 1.011  1.005 - 1.030    pH 6.5  5.0 - 8.0    Glucose, UA NEGATIVE  NEGATIVE (mg/dL)   Hgb urine dipstick LARGE (*) NEGATIVE    Bilirubin Urine NEGATIVE  NEGATIVE     Ketones, ur 15 (*) NEGATIVE (mg/dL)   Protein, ur NEGATIVE  NEGATIVE (mg/dL)   Urobilinogen, UA 1.0  0.0 - 1.0 (mg/dL)   Nitrite POSITIVE (*) NEGATIVE    Leukocytes, UA LARGE (*) NEGATIVE   URINE CULTURE      Component Value Range   Specimen Description URINE, CLEAN CATCH     Special Requests NONE     Culture  Setup Time [id number]     Colony Count >=100,000 COLONIES/ML     Culture [facility]ROBACTER KOSERI     Report Status 02/22/2012 FINAL     Organism ID, Bacteria CITROBACTER KOSERI    URINE MICROSCOPIC-ADD ON      Component Value Range   Squamous Epithelial / LPF FEW (*) RARE    WBC, UA TOO NUMEROUS TO COUNT  <3 (WBC/hpf)   RBC / HPF 3-6  <3 (RBC/hpf)   Bacteria, UA MANY (*) RARE   PROTIME-INR      Component Value Range   Prothrombin Time 20.6 (*) 11.6 - 15.2 (seconds)   INR 1.73 (*) 0.00 - 1.49     Discharge Medications:   Medication List  As of 03/09/2012  8:13 AM   TAKE these medications         cetaphil cream   Apply 1 application topically 3 (three) times daily as needed. For dry skin      estradiol 0.5 MG tablet   Commonly known as: ESTRACE   Take 0.5 mg by mouth daily.      gabapentin 300 MG capsule   Commonly known as: NEURONTIN   Take 300 mg by mouth 3 (three) times daily as needed. For itching      hydrochlorothiazide 25 MG tablet   Commonly  known as: HYDRODIURIL   Take 25 mg by mouth daily.      levothyroxine 88 MCG tablet   Commonly known as: SYNTHROID, LEVOTHROID   Take 88 mcg by mouth daily.      OVER THE COUNTER MEDICATION   Take 1 tablet by mouth daily. "Muscular Degeneration Supplement (eye vitamin)      SOOTHE OP   Apply 1 drop to eye 2 (two) times daily as needed. For dry eys      spironolactone 25 MG tablet   Commonly known as: ALDACTONE   Take 25 mg by mouth daily.      travoprost (benzalkonium) 0.004 % ophthalmic solution   Commonly known as: TRAVATAN   Place 1 drop into both eyes at bedtime.      warfarin 5 MG tablet   Commonly  known as: COUMADIN   Take 1 tablet (5 mg total) by mouth daily.            Diagnostic Studies: US Soft Tissue Head/neck  03/02/2012  *RADIOLOGY REPORT*  Clinical Data: Neck pain and swelling.  Swelling after recent knee surgery.  Patient on Synthroid.  THYROID ULTRASOUND  Technique: Ultrasound examination of the thyroid gland and adjacent soft tissues was performed.  Comparison:  None.  Findings:  Right thyroid lobe:  27 mm x 14 mm x 15 mm, somewhat diminutive. Left thyroid lobe:  23 mm x 10 mm x 9 mm, somewhat diminutive. Isthmus:  3 mm, normal.  Focal nodules:  Sub centimeter nodules are present in the right inferior thyroid lobe, measuring 8 mm, 8 mm, and 4 mm.  These have solid echotexture.  These nodules can be followed.  No microcalcifications.  Lymphadenopathy:  None visualized.  IMPRESSION: Sub centimeter right inferior pole thyroid nodules with solid echotexture.  No aggressive features.  These nodules can be followed. Somewhat diminutive thyroid, probably related to Synthroid use.  Original Report Authenticated By: Andreas Newport, M.D.   X-ray Knee Right Port  02/17/2012  *RADIOLOGY REPORT*  Clinical Data: Postop for total knee arthroplasty.  PORTABLE RIGHT KNEE - 1-2 VIEW  Comparison: None.  Findings: Status post total knee arthroplasty. No acute hardware complication.  Osseous irregularity about the lateral aspect of the tibia on the frontal view is likely related to remote trauma or hardware (given a fixation defects).  Surgical staples overlie the knee.  Expected postoperative joint fluid and air.  IMPRESSION:  1.  Expected appearance after right total knee arthroplasty. 2.  Osseous irregularity about the lateral aspect of the proximal tibia.  Presumably related to remote trauma or surgery, given concurrent remote fixation defects in this area.  Original Report Authenticated By: Consuello Bossier, M.D.    Disposition: 06-Home-Health Care Svc  Discharge Orders    Future Orders Please  Complete By Expires   Diet - low sodium heart healthy      Call MD / Call 911      Comments:   If you experience chest pain or shortness of breath, CALL 911 and be transported to the hospital emergency room.  If you develope a fever above 101 F, pus (white drainage) or increased drainage or redness at the wound, or calf pain, call your surgeon's office.   Constipation Prevention      Comments:   Drink plenty of fluids.  Prune juice may be helpful.  You may use a stool softener, such as Colace (over the counter) 100 mg twice a day.  Use MiraLax (over the counter) for  constipation as needed.   Increase activity slowly as tolerated      Weight Bearing as taught in Physical Therapy      Comments:   Use a walker or crutches as instructed.   Discharge instructions      Comments:   1. CPM 2 hours per 8 2. Keep incision dry 3. Weight bearing as tolerated         Signed: Nhu Glasby SCOTT 03/09/2012, 8:13 AM

## 2012-12-06 ENCOUNTER — Other Ambulatory Visit: Payer: Self-pay | Admitting: Obstetrics and Gynecology

## 2012-12-06 DIAGNOSIS — Z1231 Encounter for screening mammogram for malignant neoplasm of breast: Secondary | ICD-10-CM

## 2013-01-10 ENCOUNTER — Ambulatory Visit
Admission: RE | Admit: 2013-01-10 | Discharge: 2013-01-10 | Disposition: A | Discharge status: Home / Self Care | Payer: Medicare HMO | Source: Ambulatory Visit | Attending: Obstetrics and Gynecology | Admitting: Obstetrics and Gynecology

## 2013-01-10 DIAGNOSIS — Z1231 Encounter for screening mammogram for malignant neoplasm of breast: Secondary | ICD-10-CM

## 2013-08-24 ENCOUNTER — Other Ambulatory Visit: Payer: Self-pay

## 2013-08-24 ENCOUNTER — Other Ambulatory Visit: Payer: Self-pay | Admitting: Family Medicine

## 2013-08-24 DIAGNOSIS — E2839 Other primary ovarian failure: Secondary | ICD-10-CM

## 2013-09-27 ENCOUNTER — Other Ambulatory Visit: Payer: Medicare HMO

## 2013-10-03 ENCOUNTER — Ambulatory Visit
Admission: RE | Admit: 2013-10-03 | Discharge: 2013-10-03 | Disposition: A | Discharge status: Home / Self Care | Payer: Commercial Managed Care - HMO | Source: Ambulatory Visit | Attending: Family Medicine | Admitting: Family Medicine

## 2013-10-03 DIAGNOSIS — E2839 Other primary ovarian failure: Secondary | ICD-10-CM

## 2013-12-08 ENCOUNTER — Other Ambulatory Visit: Payer: Self-pay

## 2013-12-08 DIAGNOSIS — Z1231 Encounter for screening mammogram for malignant neoplasm of breast: Secondary | ICD-10-CM

## 2014-01-11 ENCOUNTER — Ambulatory Visit: Payer: Medicare HMO

## 2014-01-18 ENCOUNTER — Ambulatory Visit
Admission: RE | Admit: 2014-01-18 | Discharge: 2014-01-18 | Disposition: A | Discharge status: Home / Self Care | Payer: Commercial Managed Care - HMO | Source: Ambulatory Visit

## 2014-01-18 DIAGNOSIS — Z1231 Encounter for screening mammogram for malignant neoplasm of breast: Secondary | ICD-10-CM

## 2014-12-15 DIAGNOSIS — H3531 Nonexudative age-related macular degeneration: Secondary | ICD-10-CM | POA: Diagnosis not present

## 2014-12-15 DIAGNOSIS — H402230 Chronic angle-closure glaucoma, bilateral, stage unspecified: Secondary | ICD-10-CM | POA: Diagnosis not present

## 2014-12-15 DIAGNOSIS — H409 Unspecified glaucoma: Secondary | ICD-10-CM | POA: Diagnosis not present

## 2014-12-15 DIAGNOSIS — H2513 Age-related nuclear cataract, bilateral: Secondary | ICD-10-CM | POA: Diagnosis not present

## 2015-01-10 DIAGNOSIS — L209 Atopic dermatitis, unspecified: Secondary | ICD-10-CM | POA: Diagnosis not present

## 2015-01-10 DIAGNOSIS — I10 Essential (primary) hypertension: Secondary | ICD-10-CM | POA: Diagnosis not present

## 2015-01-10 DIAGNOSIS — E039 Hypothyroidism, unspecified: Secondary | ICD-10-CM | POA: Diagnosis not present

## 2015-02-08 DIAGNOSIS — H402230 Chronic angle-closure glaucoma, bilateral, stage unspecified: Secondary | ICD-10-CM | POA: Diagnosis not present

## 2015-02-26 DIAGNOSIS — R002 Palpitations: Secondary | ICD-10-CM | POA: Diagnosis not present

## 2015-02-26 DIAGNOSIS — R0602 Shortness of breath: Secondary | ICD-10-CM | POA: Diagnosis not present

## 2015-02-26 DIAGNOSIS — Z0000 Encounter for general adult medical examination without abnormal findings: Secondary | ICD-10-CM | POA: Diagnosis not present

## 2015-02-26 DIAGNOSIS — E668 Other obesity: Secondary | ICD-10-CM | POA: Diagnosis not present

## 2015-02-26 DIAGNOSIS — R739 Hyperglycemia, unspecified: Secondary | ICD-10-CM | POA: Diagnosis not present

## 2015-02-26 DIAGNOSIS — R0600 Dyspnea, unspecified: Secondary | ICD-10-CM | POA: Diagnosis not present

## 2015-02-26 DIAGNOSIS — E039 Hypothyroidism, unspecified: Secondary | ICD-10-CM | POA: Diagnosis not present

## 2015-02-26 DIAGNOSIS — I10 Essential (primary) hypertension: Secondary | ICD-10-CM | POA: Diagnosis not present

## 2015-02-26 DIAGNOSIS — I498 Other specified cardiac arrhythmias: Secondary | ICD-10-CM | POA: Diagnosis not present

## 2015-02-27 DIAGNOSIS — R002 Palpitations: Secondary | ICD-10-CM | POA: Diagnosis not present

## 2015-03-23 DIAGNOSIS — R002 Palpitations: Secondary | ICD-10-CM | POA: Diagnosis not present

## 2015-03-23 DIAGNOSIS — E668 Other obesity: Secondary | ICD-10-CM | POA: Diagnosis not present

## 2015-03-23 DIAGNOSIS — E039 Hypothyroidism, unspecified: Secondary | ICD-10-CM | POA: Diagnosis not present

## 2015-03-23 DIAGNOSIS — I10 Essential (primary) hypertension: Secondary | ICD-10-CM | POA: Diagnosis not present

## 2015-04-26 DIAGNOSIS — Z01419 Encounter for gynecological examination (general) (routine) without abnormal findings: Secondary | ICD-10-CM | POA: Diagnosis not present

## 2015-04-26 DIAGNOSIS — Z1231 Encounter for screening mammogram for malignant neoplasm of breast: Secondary | ICD-10-CM | POA: Diagnosis not present

## 2015-05-31 DIAGNOSIS — I10 Essential (primary) hypertension: Secondary | ICD-10-CM | POA: Diagnosis not present

## 2015-05-31 DIAGNOSIS — R739 Hyperglycemia, unspecified: Secondary | ICD-10-CM | POA: Diagnosis not present

## 2015-05-31 DIAGNOSIS — M1990 Unspecified osteoarthritis, unspecified site: Secondary | ICD-10-CM | POA: Diagnosis not present

## 2015-05-31 DIAGNOSIS — H409 Unspecified glaucoma: Secondary | ICD-10-CM | POA: Diagnosis not present

## 2015-05-31 DIAGNOSIS — E559 Vitamin D deficiency, unspecified: Secondary | ICD-10-CM | POA: Diagnosis not present

## 2015-05-31 DIAGNOSIS — E039 Hypothyroidism, unspecified: Secondary | ICD-10-CM | POA: Diagnosis not present

## 2015-05-31 DIAGNOSIS — R5383 Other fatigue: Secondary | ICD-10-CM | POA: Diagnosis not present

## 2015-06-21 DIAGNOSIS — I10 Essential (primary) hypertension: Secondary | ICD-10-CM | POA: Diagnosis not present

## 2015-06-21 DIAGNOSIS — E039 Hypothyroidism, unspecified: Secondary | ICD-10-CM | POA: Diagnosis not present

## 2015-10-19 DIAGNOSIS — Z23 Encounter for immunization: Secondary | ICD-10-CM | POA: Diagnosis not present

## 2015-10-19 DIAGNOSIS — M542 Cervicalgia: Secondary | ICD-10-CM | POA: Diagnosis not present

## 2015-10-19 DIAGNOSIS — E559 Vitamin D deficiency, unspecified: Secondary | ICD-10-CM | POA: Diagnosis not present

## 2015-10-19 DIAGNOSIS — I10 Essential (primary) hypertension: Secondary | ICD-10-CM | POA: Diagnosis not present

## 2015-11-12 DIAGNOSIS — Z79899 Other long term (current) drug therapy: Secondary | ICD-10-CM | POA: Diagnosis not present

## 2015-12-19 DIAGNOSIS — H25013 Cortical age-related cataract, bilateral: Secondary | ICD-10-CM | POA: Diagnosis not present

## 2015-12-19 DIAGNOSIS — H402230 Chronic angle-closure glaucoma, bilateral, stage unspecified: Secondary | ICD-10-CM | POA: Diagnosis not present

## 2015-12-19 DIAGNOSIS — H2513 Age-related nuclear cataract, bilateral: Secondary | ICD-10-CM | POA: Diagnosis not present

## 2015-12-19 DIAGNOSIS — H409 Unspecified glaucoma: Secondary | ICD-10-CM | POA: Diagnosis not present

## 2015-12-25 DIAGNOSIS — E039 Hypothyroidism, unspecified: Secondary | ICD-10-CM | POA: Diagnosis not present

## 2015-12-25 DIAGNOSIS — I10 Essential (primary) hypertension: Secondary | ICD-10-CM | POA: Diagnosis not present

## 2015-12-25 DIAGNOSIS — R7302 Impaired glucose tolerance (oral): Secondary | ICD-10-CM | POA: Diagnosis not present

## 2016-02-14 DIAGNOSIS — H402221 Chronic angle-closure glaucoma, left eye, mild stage: Secondary | ICD-10-CM | POA: Diagnosis not present

## 2016-02-14 DIAGNOSIS — H524 Presbyopia: Secondary | ICD-10-CM | POA: Diagnosis not present

## 2016-02-14 DIAGNOSIS — H402211 Chronic angle-closure glaucoma, right eye, mild stage: Secondary | ICD-10-CM | POA: Diagnosis not present

## 2016-02-14 DIAGNOSIS — H04123 Dry eye syndrome of bilateral lacrimal glands: Secondary | ICD-10-CM | POA: Diagnosis not present

## 2016-02-26 DIAGNOSIS — E785 Hyperlipidemia, unspecified: Secondary | ICD-10-CM | POA: Diagnosis not present

## 2016-02-26 DIAGNOSIS — R739 Hyperglycemia, unspecified: Secondary | ICD-10-CM | POA: Diagnosis not present

## 2016-02-26 DIAGNOSIS — Z0000 Encounter for general adult medical examination without abnormal findings: Secondary | ICD-10-CM | POA: Diagnosis not present

## 2016-02-26 DIAGNOSIS — I10 Essential (primary) hypertension: Secondary | ICD-10-CM | POA: Diagnosis not present

## 2016-02-26 DIAGNOSIS — E559 Vitamin D deficiency, unspecified: Secondary | ICD-10-CM | POA: Diagnosis not present

## 2016-03-31 DIAGNOSIS — R002 Palpitations: Secondary | ICD-10-CM | POA: Diagnosis not present

## 2016-03-31 DIAGNOSIS — E668 Other obesity: Secondary | ICD-10-CM | POA: Diagnosis not present

## 2016-03-31 DIAGNOSIS — I10 Essential (primary) hypertension: Secondary | ICD-10-CM | POA: Diagnosis not present

## 2016-03-31 DIAGNOSIS — E039 Hypothyroidism, unspecified: Secondary | ICD-10-CM | POA: Diagnosis not present

## 2016-04-29 DIAGNOSIS — Z1231 Encounter for screening mammogram for malignant neoplasm of breast: Secondary | ICD-10-CM | POA: Diagnosis not present

## 2016-04-29 DIAGNOSIS — N8111 Cystocele, midline: Secondary | ICD-10-CM | POA: Diagnosis not present

## 2016-04-29 DIAGNOSIS — Z01419 Encounter for gynecological examination (general) (routine) without abnormal findings: Secondary | ICD-10-CM | POA: Diagnosis not present

## 2016-04-29 DIAGNOSIS — N39498 Other specified urinary incontinence: Secondary | ICD-10-CM | POA: Diagnosis not present

## 2016-05-08 ENCOUNTER — Other Ambulatory Visit: Payer: Self-pay | Admitting: Obstetrics and Gynecology

## 2016-05-08 DIAGNOSIS — R928 Other abnormal and inconclusive findings on diagnostic imaging of breast: Secondary | ICD-10-CM

## 2016-05-14 ENCOUNTER — Ambulatory Visit
Admission: RE | Admit: 2016-05-14 | Discharge: 2016-05-14 | Disposition: A | Discharge status: Home / Self Care | Payer: Commercial Managed Care - HMO | Source: Ambulatory Visit | Attending: Obstetrics and Gynecology | Admitting: Obstetrics and Gynecology

## 2016-05-14 ENCOUNTER — Other Ambulatory Visit: Payer: Self-pay | Admitting: Obstetrics and Gynecology

## 2016-05-14 DIAGNOSIS — R928 Other abnormal and inconclusive findings on diagnostic imaging of breast: Secondary | ICD-10-CM

## 2016-05-14 DIAGNOSIS — N63 Unspecified lump in breast: Secondary | ICD-10-CM | POA: Diagnosis not present

## 2016-05-14 DIAGNOSIS — N6320 Unspecified lump in the left breast, unspecified quadrant: Secondary | ICD-10-CM

## 2016-05-14 DIAGNOSIS — N6489 Other specified disorders of breast: Secondary | ICD-10-CM | POA: Diagnosis not present

## 2016-06-09 DIAGNOSIS — H04123 Dry eye syndrome of bilateral lacrimal glands: Secondary | ICD-10-CM | POA: Diagnosis not present

## 2016-06-09 DIAGNOSIS — H402211 Chronic angle-closure glaucoma, right eye, mild stage: Secondary | ICD-10-CM | POA: Diagnosis not present

## 2016-06-09 DIAGNOSIS — H109 Unspecified conjunctivitis: Secondary | ICD-10-CM | POA: Diagnosis not present

## 2016-06-09 DIAGNOSIS — H402221 Chronic angle-closure glaucoma, left eye, mild stage: Secondary | ICD-10-CM | POA: Diagnosis not present

## 2016-06-12 ENCOUNTER — Ambulatory Visit: Payer: Self-pay | Admitting: Surgery

## 2016-06-12 DIAGNOSIS — N6320 Unspecified lump in the left breast, unspecified quadrant: Secondary | ICD-10-CM

## 2016-06-12 DIAGNOSIS — N63 Unspecified lump in breast: Secondary | ICD-10-CM | POA: Diagnosis not present

## 2016-06-12 NOTE — H&P (Signed)
Alisha [name] 06/12/2016 3:03 PM Location: [facility] Patient #: [id number] DOB: [birth year] Single / Language: Alisha [name] / Race: Black or African American Female  History of Present Illness ([name] MD; 06/12/2016 5:08 PM) Patient words: left breast mass  Patient sent at the request of Dr. Lovey Newcomer for left breast mammographic abnormality left breast mass. Core biopsy showed a complex large lesion measuring 0.6 cm in maximal diameter. Patient denies any history of breast pain, nipple discharge or change in the appearance of her breast. She had 1 previous breast biopsy which was open which was benign. No family history of breast cancer.          CLINICAL DATA: Left breast upper outer quadrant architectural distortion seen on most recent screening mammography. EXAM: 2D DIGITAL DIAGNOSTIC LEFT MAMMOGRAM WITH CAD AND ADJUNCT TOMO ULTRASOUND LEFT BREAST COMPARISON: Previous exam(s). ACR Breast Density Category b: There are scattered areas of fibroglandular density. FINDINGS: Mammographically, there is a persistent sub cm area of architectural distortion in the left breast slightly upper outer quadrant, middle depth. No associated mass is seen. Mammographic images were processed with CAD. On physical exam, no suspicious masses are found. Targeted ultrasound is performed, showing hypoechoic irregular nodule with associated architectural distortion in the left 3 o'clock breast 4 cm from the nipple measuring 0.6 x 0.6 x 0.4 cm. This is thought to correspond to the mammographically seen area of architectural distortion. IMPRESSION: Left breast 3 o'clock 6 mm nodule with associated architectural distortion, for which ultrasound-guided core needle biopsy is recommended. RECOMMENDATION: Ultrasound-guided core needle biopsy of the left breast. I have discussed the findings and recommendations with the patient. Results were also provided in writing at the  conclusion of the visit. If applicable, a reminder letter will be sent to the patient regarding the next appointment. BI-RADS CATEGORY 4: Suspicious. Electronically Signed By: [name] M.D. On: 05/14/2016 12:25                     Diagnosis Breast, left, needle core biopsy, 3:30 o'clock - COMPLEX SCLEROSING LESION. - SEE COMMENT.  The patient is a 81 year old female.   Other Problems Alisha [name], CMA; 06/12/2016 3:04 PM) Arthritis Asthma Back Pain High blood pressure Oophorectomy Left. Thyroid Disease  Past Surgical History Alisha [name], [place]; 06/12/2016 3:04 PM) Breast Biopsy Left. Foot Surgery Left. Knee Surgery Left.  Diagnostic Studies History Alisha [name], [place]; 06/12/2016 3:04 PM) Colonoscopy 1-5 years ago Mammogram 1-3 years ago  Allergies Alisha [name], [place]; 06/12/2016 3:06 PM) Benicar *ANTIHYPERTENSIVES* Lisinopril *ANTIHYPERTENSIVES* Norvasc *CALCIUM CHANNEL BLOCKERS* Penicillin G Pot in Dextrose *PENICILLINS* Sulfa 10 *OPHTHALMIC AGENTS*  Medication History Alisha [name], CMA; 06/12/2016 3:09 PM) Levothyroxine Sodium (88MCG Tablet, Oral) Active. HydroCHLOROthiazide ('25MG'$  Tablet, Oral) Active. Furosemide ('40MG'$  Tablet, Oral) Active. Potassium Chloride Crys ER (20MEQ Tablet ER, Oral) Active. Refresh Optive (0.5-0.9% Solution, Ophthalmic) Active. ProAir HFA (108 (90 Base)MCG/ACT Aerosol Soln, Inhalation) Active. Centrum Silver (Oral) Active. Probiotic (Oral) Active. Vitamin D3 (1000UNIT Tablet, Oral) Active. Aleve ('220MG'$  Capsule, Oral) Active. Medications Reconciled  Social History Alisha [name], [place]; 06/12/2016 3:04 PM) Caffeine use Coffee, Tea. No alcohol use Tobacco use Never smoker.  Family History Alisha [name], [place]; 06/12/2016 3:04 PM) Alcohol Abuse Brother, Father, Sister. Arthritis Mother. Depression Sister. Diabetes Mellitus Brother,  Sister. Migraine Headache Daughter. Respiratory Condition Sister.  Pregnancy / Birth History Alisha [name], [place]; 06/12/2016 3:04 PM) Age at menarche 41 years. Age of menopause 35-50  Gravida 2 Maternal age 55-20 Para 2    Vitals ([place]. Brooks CMA; 06/12/2016 3:04 PM) 06/12/2016 3:03 PM Weight: 218.13 lb Height: 64.5in Body Surface Area: 2.04 m Body Mass Index: 36.86 kg/m  BP: 132/86 (Sitting, Left Arm, Standard)      Physical Exam ([name] MD; 06/12/2016 5:09 PM)  General Mental Status-Alert. General Appearance-Consistent with stated age. Hydration-Well hydrated. Voice-Normal.  Head and Neck Head-normocephalic, atraumatic with no lesions or palpable masses. Trachea-midline. Thyroid Gland Characteristics - normal size and consistency.  Eye Eyeball - Bilateral-Extraocular movements intact. Sclera/Conjunctiva - Bilateral-No scleral icterus.  Chest and Lung Exam Chest and lung exam reveals -quiet, even and easy respiratory effort with no use of accessory muscles and on auscultation, normal breath sounds, no adventitious sounds and normal vocal resonance. Inspection Chest Wall - Normal. Back - normal.  Breast Note: No masses bilateral bilaterally. Previous scar to the left breast.  Cardiovascular Cardiovascular examination reveals -normal heart sounds, regular rate and rhythm with no murmurs and normal pedal pulses bilaterally.  Neurologic Neurologic evaluation reveals -alert and oriented x 3 with no impairment of recent or remote memory. Mental Status-Normal.  Musculoskeletal Normal Exam - Left-Upper Extremity Strength Normal and Lower Extremity Strength Normal. Normal Exam - Right-Upper Extremity Strength Normal and Lower Extremity Strength Normal.  Lymphatic Head & Neck  General Head & Neck Lymphatics: Bilateral - Description - Normal. Axillary  General Axillary Region: Bilateral - Description  - Normal. Tenderness - Non Tender.    Assessment & Plan ([name] MD; 06/12/2016 5:09 PM)  MASS OF LEFT BREAST ON MAMMOGRAM (N63) Impression: Discussed options of left breast lumpectomy with seed Risk of lumpectomy include bleeding, infection, seroma, more surgery, use of seed/wire, wound care, cosmetic deformity and the need for other treatments, death , blood clots, death. Pt agrees to proceed. localization versus observation. Small risk of malignancy discussed with the patient in this setting. She is opted to undergo a left breast lumpectomy.  Current Plans Pt Education - [facility] Breast Biopsy HCI: discussed with patient and provided information. You are being scheduled for surgery - Our schedulers will call you.  You should hear from our office's scheduling department within 5 working days about the location, date, and time of surgery. We try to make accommodations for patient's preferences in scheduling surgery, but sometimes the OR schedule or the surgeon's schedule prevents [place] from making those accommodations.  If you have not heard from our office [phone]) in 5 working days, call the office and ask for your surgeon's nurse.  If you have other questions about your diagnosis, plan, or surgery, call the office and ask for your surgeon's nurse.  The anatomy and the physiology was discussed. The pathophysiology and natural history of the disease was discussed. Options were discussed and recommendations were made. Technique, risks, benefits, & alternatives were discussed. Risks such as stroke, heart attack, bleeding, indection, death, and other risks discussed. Questions answered. The patient agrees to proceed.

## 2016-07-04 ENCOUNTER — Other Ambulatory Visit: Payer: Self-pay | Admitting: Surgery

## 2016-07-04 DIAGNOSIS — N6320 Unspecified lump in the left breast, unspecified quadrant: Secondary | ICD-10-CM

## 2016-07-10 DIAGNOSIS — H35033 Hypertensive retinopathy, bilateral: Secondary | ICD-10-CM | POA: Diagnosis not present

## 2016-07-10 DIAGNOSIS — H402211 Chronic angle-closure glaucoma, right eye, mild stage: Secondary | ICD-10-CM | POA: Diagnosis not present

## 2016-07-10 DIAGNOSIS — H2511 Age-related nuclear cataract, right eye: Secondary | ICD-10-CM | POA: Diagnosis not present

## 2016-07-10 DIAGNOSIS — H353132 Nonexudative age-related macular degeneration, bilateral, intermediate dry stage: Secondary | ICD-10-CM | POA: Diagnosis not present

## 2016-07-10 DIAGNOSIS — H402231 Chronic angle-closure glaucoma, bilateral, mild stage: Secondary | ICD-10-CM | POA: Diagnosis not present

## 2016-07-10 DIAGNOSIS — H25011 Cortical age-related cataract, right eye: Secondary | ICD-10-CM | POA: Diagnosis not present

## 2016-07-10 DIAGNOSIS — H402221 Chronic angle-closure glaucoma, left eye, mild stage: Secondary | ICD-10-CM | POA: Diagnosis not present

## 2016-08-01 DIAGNOSIS — H2511 Age-related nuclear cataract, right eye: Secondary | ICD-10-CM | POA: Diagnosis not present

## 2016-08-06 ENCOUNTER — Encounter (HOSPITAL_BASED_OUTPATIENT_CLINIC_OR_DEPARTMENT_OTHER): Payer: Self-pay | Admitting: *Deleted

## 2016-08-06 ENCOUNTER — Encounter (HOSPITAL_BASED_OUTPATIENT_CLINIC_OR_DEPARTMENT_OTHER)
Admission: RE | Admit: 2016-08-06 | Discharge: 2016-08-06 | Disposition: A | Discharge status: Home / Self Care | Payer: Commercial Managed Care - HMO | Source: Ambulatory Visit | Attending: Surgery | Admitting: Surgery

## 2016-08-06 DIAGNOSIS — I498 Other specified cardiac arrhythmias: Secondary | ICD-10-CM | POA: Diagnosis not present

## 2016-08-06 DIAGNOSIS — I10 Essential (primary) hypertension: Secondary | ICD-10-CM | POA: Diagnosis not present

## 2016-08-06 DIAGNOSIS — N63 Unspecified lump in unspecified breast: Secondary | ICD-10-CM | POA: Diagnosis not present

## 2016-08-06 LAB — BASIC METABOLIC PANEL
Anion gap: 10 (ref 5–15)
BUN: 9 mg/dL (ref 6–20)
CO2: 23 mmol/L (ref 22–32)
Calcium: 9.5 mg/dL (ref 8.9–10.3)
Chloride: 108 mmol/L (ref 101–111)
Creatinine, Ser: 0.63 mg/dL (ref 0.44–1.00)
GFR calc Af Amer: >60 mL/min (ref >60)
GFR calc non Af Amer: >60 mL/min (ref >60)
Glucose, Bld: 96 mg/dL (ref 65–99)
Potassium: 4 mmol/L (ref 3.5–5.1)
Sodium: 141 mmol/L (ref 135–145)

## 2016-08-06 NOTE — Progress Notes (Signed)
Boost drink given with instructions to complete by 0715, pt verbalized understanding.

## 2016-08-08 ENCOUNTER — Ambulatory Visit
Admission: RE | Admit: 2016-08-08 | Discharge: 2016-08-08 | Disposition: A | Discharge status: Home / Self Care | Payer: Commercial Managed Care - HMO | Source: Ambulatory Visit | Attending: Surgery | Admitting: Surgery

## 2016-08-08 DIAGNOSIS — N6320 Unspecified lump in the left breast, unspecified quadrant: Secondary | ICD-10-CM

## 2016-08-08 DIAGNOSIS — R928 Other abnormal and inconclusive findings on diagnostic imaging of breast: Secondary | ICD-10-CM | POA: Diagnosis not present

## 2016-08-11 ENCOUNTER — Ambulatory Visit (HOSPITAL_BASED_OUTPATIENT_CLINIC_OR_DEPARTMENT_OTHER): Payer: Commercial Managed Care - HMO | Admitting: Anesthesiology

## 2016-08-11 ENCOUNTER — Encounter (HOSPITAL_BASED_OUTPATIENT_CLINIC_OR_DEPARTMENT_OTHER): Payer: Self-pay | Admitting: Anesthesiology

## 2016-08-11 ENCOUNTER — Ambulatory Visit
Admission: RE | Admit: 2016-08-11 | Discharge: 2016-08-11 | Disposition: A | Discharge status: Home / Self Care | Payer: Commercial Managed Care - HMO | Source: Ambulatory Visit | Attending: Surgery | Admitting: Surgery

## 2016-08-11 ENCOUNTER — Encounter (HOSPITAL_BASED_OUTPATIENT_CLINIC_OR_DEPARTMENT_OTHER)
Admission: RE | Disposition: A | Discharge status: Home / Self Care | Payer: Self-pay | Source: Ambulatory Visit | Attending: Surgery

## 2016-08-11 ENCOUNTER — Ambulatory Visit (HOSPITAL_BASED_OUTPATIENT_CLINIC_OR_DEPARTMENT_OTHER)
Admission: RE | Admit: 2016-08-11 | Discharge: 2016-08-11 | Disposition: A | Discharge status: Home / Self Care | Payer: Commercial Managed Care - HMO | Source: Ambulatory Visit | Attending: Surgery | Admitting: Surgery

## 2016-08-11 DIAGNOSIS — R921 Mammographic calcification found on diagnostic imaging of breast: Secondary | ICD-10-CM | POA: Diagnosis not present

## 2016-08-11 DIAGNOSIS — J45909 Unspecified asthma, uncomplicated: Secondary | ICD-10-CM | POA: Diagnosis not present

## 2016-08-11 DIAGNOSIS — R928 Other abnormal and inconclusive findings on diagnostic imaging of breast: Secondary | ICD-10-CM | POA: Diagnosis not present

## 2016-08-11 DIAGNOSIS — N6092 Unspecified benign mammary dysplasia of left breast: Secondary | ICD-10-CM | POA: Insufficient documentation

## 2016-08-11 DIAGNOSIS — Z6838 Body mass index (BMI) 38.0-38.9, adult: Secondary | ICD-10-CM | POA: Diagnosis not present

## 2016-08-11 DIAGNOSIS — E039 Hypothyroidism, unspecified: Secondary | ICD-10-CM | POA: Insufficient documentation

## 2016-08-11 DIAGNOSIS — N6082 Other benign mammary dysplasias of left breast: Secondary | ICD-10-CM | POA: Diagnosis not present

## 2016-08-11 DIAGNOSIS — I10 Essential (primary) hypertension: Secondary | ICD-10-CM | POA: Insufficient documentation

## 2016-08-11 DIAGNOSIS — E669 Obesity, unspecified: Secondary | ICD-10-CM | POA: Diagnosis not present

## 2016-08-11 DIAGNOSIS — N6489 Other specified disorders of breast: Secondary | ICD-10-CM | POA: Diagnosis not present

## 2016-08-11 DIAGNOSIS — Z79899 Other long term (current) drug therapy: Secondary | ICD-10-CM | POA: Diagnosis not present

## 2016-08-11 DIAGNOSIS — N6320 Unspecified lump in the left breast, unspecified quadrant: Secondary | ICD-10-CM

## 2016-08-11 HISTORY — PX: BREAST LUMPECTOMY WITH RADIOACTIVE SEED LOCALIZATION: SHX6424

## 2016-08-11 HISTORY — PX: BREAST LUMPECTOMY: SHX2

## 2016-08-11 HISTORY — DX: Unspecified lump in the left breast, unspecified quadrant: N63.20

## 2016-08-11 SURGERY — BREAST LUMPECTOMY WITH RADIOACTIVE SEED LOCALIZATION
Anesthesia: General | Site: Breast | Laterality: Left

## 2016-08-11 MED ORDER — CHLORHEXIDINE GLUCONATE CLOTH 2 % EX PADS: 6.0000 | MEDICATED_PAD | Freq: Once | CUTANEOUS | Status: DC

## 2016-08-11 MED ORDER — LIDOCAINE 2% (20 MG/ML) 5 ML SYRINGE
INTRAMUSCULAR | Status: AC
  Filled 2016-08-11: qty 5

## 2016-08-11 MED ORDER — PHENYLEPHRINE 40 MCG/ML (10ML) SYRINGE FOR IV PUSH (FOR BLOOD PRESSURE SUPPORT)
PREFILLED_SYRINGE | INTRAVENOUS | Status: AC
  Filled 2016-08-11: qty 10

## 2016-08-11 MED ORDER — BUPIVACAINE-EPINEPHRINE (PF) 0.25% -1:200000 IJ SOLN
INTRAMUSCULAR | Status: AC
  Filled 2016-08-11: qty 30

## 2016-08-11 MED ORDER — ONDANSETRON HCL 4 MG/2ML IJ SOLN
INTRAMUSCULAR | Status: AC
  Filled 2016-08-11: qty 2

## 2016-08-11 MED ORDER — PROPOFOL 10 MG/ML IV BOLUS
INTRAVENOUS | Status: DC | PRN
  Administered 2016-08-11: 50 mg via INTRAVENOUS
  Administered 2016-08-11: 150 mg via INTRAVENOUS

## 2016-08-11 MED ORDER — HYDROCODONE-ACETAMINOPHEN 5-325 MG PO TABS: 1.0000 | ORAL_TABLET | Freq: Four times a day (QID) | ORAL | 0 refills | Status: DC | PRN

## 2016-08-11 MED ORDER — FENTANYL CITRATE (PF) 100 MCG/2ML IJ SOLN
INTRAMUSCULAR | Status: DC | PRN
  Administered 2016-08-11: 100 ug via INTRAVENOUS

## 2016-08-11 MED ORDER — FENTANYL CITRATE (PF) 100 MCG/2ML IJ SOLN: 50.0000 ug | INTRAMUSCULAR | Status: DC | PRN

## 2016-08-11 MED ORDER — EPHEDRINE SULFATE 50 MG/ML IJ SOLN
INTRAMUSCULAR | Status: DC | PRN
  Administered 2016-08-11: 10 mg via INTRAVENOUS

## 2016-08-11 MED ORDER — BUPIVACAINE-EPINEPHRINE (PF) 0.25% -1:200000 IJ SOLN
INTRAMUSCULAR | Status: DC | PRN
  Administered 2016-08-11: 10 mL

## 2016-08-11 MED ORDER — FENTANYL CITRATE (PF) 100 MCG/2ML IJ SOLN
INTRAMUSCULAR | Status: AC
  Filled 2016-08-11: qty 2

## 2016-08-11 MED ORDER — GABAPENTIN 300 MG PO CAPS
300.0000 mg | ORAL_CAPSULE | ORAL | Status: AC
  Administered 2016-08-11: 300 mg via ORAL

## 2016-08-11 MED ORDER — MIDAZOLAM HCL 2 MG/2ML IJ SOLN
INTRAMUSCULAR | Status: AC
  Filled 2016-08-11: qty 2

## 2016-08-11 MED ORDER — ONDANSETRON HCL 4 MG/2ML IJ SOLN
INTRAMUSCULAR | Status: DC | PRN
  Administered 2016-08-11: 4 mg via INTRAVENOUS

## 2016-08-11 MED ORDER — CLINDAMYCIN PHOSPHATE 600 MG/50ML IV SOLN
600.0000 mg | Freq: Once | INTRAVENOUS | Status: AC
  Administered 2016-08-11: 600 mg via INTRAVENOUS

## 2016-08-11 MED ORDER — CLINDAMYCIN PHOSPHATE 600 MG/50ML IV SOLN
INTRAVENOUS | Status: AC
  Filled 2016-08-11: qty 50

## 2016-08-11 MED ORDER — FENTANYL CITRATE (PF) 100 MCG/2ML IJ SOLN: 25.0000 ug | INTRAMUSCULAR | Status: DC | PRN

## 2016-08-11 MED ORDER — LIDOCAINE HCL (CARDIAC) 20 MG/ML IV SOLN
INTRAVENOUS | Status: DC | PRN
  Administered 2016-08-11: 30 mg via INTRAVENOUS

## 2016-08-11 MED ORDER — DEXAMETHASONE SODIUM PHOSPHATE 10 MG/ML IJ SOLN
INTRAMUSCULAR | Status: AC
  Filled 2016-08-11: qty 1

## 2016-08-11 MED ORDER — GLYCOPYRROLATE 0.2 MG/ML IV SOSY
PREFILLED_SYRINGE | INTRAVENOUS | Status: AC
  Filled 2016-08-11: qty 3

## 2016-08-11 MED ORDER — GABAPENTIN 300 MG PO CAPS
ORAL_CAPSULE | ORAL | Status: AC
  Filled 2016-08-11: qty 1

## 2016-08-11 MED ORDER — LACTATED RINGERS IV SOLN
INTRAVENOUS | Status: DC
  Administered 2016-08-11 (×2): via INTRAVENOUS

## 2016-08-11 MED ORDER — SCOPOLAMINE 1 MG/3DAYS TD PT72: 1.0000 | MEDICATED_PATCH | Freq: Once | TRANSDERMAL | Status: DC | PRN

## 2016-08-11 MED ORDER — ACETAMINOPHEN 500 MG PO TABS
1000.0000 mg | ORAL_TABLET | ORAL | Status: AC
  Administered 2016-08-11: 1000 mg via ORAL

## 2016-08-11 MED ORDER — GLYCOPYRROLATE 0.2 MG/ML IJ SOLN
0.2000 mg | Freq: Once | INTRAMUSCULAR | Status: AC | PRN
  Administered 2016-08-11: 0.2 mg via INTRAVENOUS

## 2016-08-11 MED ORDER — PROPOFOL 10 MG/ML IV BOLUS
INTRAVENOUS | Status: AC
  Filled 2016-08-11: qty 20

## 2016-08-11 MED ORDER — MIDAZOLAM HCL 2 MG/2ML IJ SOLN: 1.0000 mg | INTRAMUSCULAR | Status: DC | PRN

## 2016-08-11 MED ORDER — DEXAMETHASONE SODIUM PHOSPHATE 4 MG/ML IJ SOLN
INTRAMUSCULAR | Status: DC | PRN
  Administered 2016-08-11: 10 mg via INTRAVENOUS

## 2016-08-11 MED ORDER — ACETAMINOPHEN 500 MG PO TABS
ORAL_TABLET | ORAL | Status: AC
Start: 2016-08-11 — End: 2016-08-11
  Filled 2016-08-11: qty 2

## 2016-08-11 SURGICAL SUPPLY — 48 items
APPLIER CLIP 9.375 MED OPEN (MISCELLANEOUS) ×2
BINDER BREAST LRG (GAUZE/BANDAGES/DRESSINGS) IMPLANT
BINDER BREAST MEDIUM (GAUZE/BANDAGES/DRESSINGS) IMPLANT
BINDER BREAST XLRG (GAUZE/BANDAGES/DRESSINGS) ×2 IMPLANT
BINDER BREAST XXLRG (GAUZE/BANDAGES/DRESSINGS) IMPLANT
BLADE SURG 15 STRL LF DISP TIS (BLADE) ×1 IMPLANT
BLADE SURG 15 STRL SS (BLADE) ×1
CANISTER SUC SOCK COL 7IN (MISCELLANEOUS) IMPLANT
CANISTER SUCT 1200ML W/VALVE (MISCELLANEOUS) ×2 IMPLANT
CHLORAPREP W/TINT 26ML (MISCELLANEOUS) ×2 IMPLANT
CLIP APPLIE 9.375 MED OPEN (MISCELLANEOUS) ×1 IMPLANT
COVER BACK TABLE 60X90IN (DRAPES) ×2 IMPLANT
COVER MAYO STAND STRL (DRAPES) ×2 IMPLANT
COVER PROBE W GEL 5X96 (DRAPES) ×2 IMPLANT
DECANTER SPIKE VIAL GLASS SM (MISCELLANEOUS) IMPLANT
DERMABOND ADVANCED (GAUZE/BANDAGES/DRESSINGS) ×1
DERMABOND ADVANCED .7 DNX12 (GAUZE/BANDAGES/DRESSINGS) ×1 IMPLANT
DEVICE DUBIN W/COMP PLATE 8390 (MISCELLANEOUS) ×2 IMPLANT
DRAPE LAPAROSCOPIC ABDOMINAL (DRAPES) IMPLANT
DRAPE LAPAROTOMY 100X72 PEDS (DRAPES) ×2 IMPLANT
DRAPE UTILITY XL STRL (DRAPES) ×2 IMPLANT
ELECT COATED BLADE 2.86 ST (ELECTRODE) ×2 IMPLANT
ELECT REM PT RETURN 9FT ADLT (ELECTROSURGICAL) ×2
ELECTRODE REM PT RTRN 9FT ADLT (ELECTROSURGICAL) ×1 IMPLANT
GLOVE BIOGEL PI IND STRL 7.0 (GLOVE) ×1 IMPLANT
GLOVE BIOGEL PI IND STRL 8 (GLOVE) ×1 IMPLANT
GLOVE BIOGEL PI INDICATOR 7.0 (GLOVE) ×1
GLOVE BIOGEL PI INDICATOR 8 (GLOVE) ×1
GLOVE ECLIPSE 6.5 STRL STRAW (GLOVE) ×2 IMPLANT
GLOVE ECLIPSE 8.0 STRL XLNG CF (GLOVE) ×2 IMPLANT
GOWN STRL REUS W/ TWL LRG LVL3 (GOWN DISPOSABLE) ×2 IMPLANT
GOWN STRL REUS W/TWL LRG LVL3 (GOWN DISPOSABLE) ×2
HEMOSTAT SNOW SURGICEL 2X4 (HEMOSTASIS) IMPLANT
KIT MARKER MARGIN INK (KITS) ×2 IMPLANT
NEEDLE HYPO 25X1 1.5 SAFETY (NEEDLE) ×2 IMPLANT
NS IRRIG 1000ML POUR BTL (IV SOLUTION) IMPLANT
PACK BASIN DAY SURGERY FS (CUSTOM PROCEDURE TRAY) ×2 IMPLANT
PENCIL BUTTON HOLSTER BLD 10FT (ELECTRODE) ×2 IMPLANT
SLEEVE SCD COMPRESS KNEE MED (MISCELLANEOUS) ×2 IMPLANT
SPONGE LAP 4X18 X RAY DECT (DISPOSABLE) ×2 IMPLANT
SUT MNCRL AB 4-0 PS2 18 (SUTURE) ×2 IMPLANT
SUT SILK 2 0 SH (SUTURE) IMPLANT
SUT VICRYL 3-0 CR8 SH (SUTURE) ×2 IMPLANT
SYR CONTROL 10ML LL (SYRINGE) ×2 IMPLANT
TOWEL OR 17X24 6PK STRL BLUE (TOWEL DISPOSABLE) ×2 IMPLANT
TOWEL OR NON WOVEN STRL DISP B (DISPOSABLE) IMPLANT
TUBE CONNECTING 20X1/4 (TUBING) ×2 IMPLANT
YANKAUER SUCT BULB TIP NO VENT (SUCTIONS) ×2 IMPLANT

## 2016-08-11 NOTE — Transfer of Care (Signed)
Immediate Anesthesia Transfer of Care Note  Patient: Alisha [name]  Procedure(s) Performed: Procedure(s): LEFT BREAST LUMPECTOMY WITH RADIOACTIVE SEED LOCALIZATION (Left)  Patient Location: PACU  Anesthesia Type:General  Level of Consciousness: awake and patient cooperative  Airway & Oxygen Therapy: Patient Spontanous Breathing and Patient connected to face mask oxygen  Post-op Assessment: Report given to RN and Post -op Vital signs reviewed and stable  Post vital signs: Reviewed and stable  Last Vitals:  Vitals:   08/11/16 0802  BP: (!) 164/58  Pulse: 69  Resp: 20  Temp: 36.9 C    Last Pain:  Vitals:   08/11/16 0802  TempSrc: Oral         Complications: No apparent anesthesia complications

## 2016-08-11 NOTE — Anesthesia Postprocedure Evaluation (Signed)
Anesthesia Post Note  Patient: Alisha [name]  Procedure(s) Performed: Procedure(s) (LRB): LEFT BREAST LUMPECTOMY WITH RADIOACTIVE SEED LOCALIZATION (Left)  Patient location during evaluation: PACU Anesthesia Type: General Level of consciousness: awake and alert Pain management: pain level controlled Vital Signs Assessment: post-procedure vital signs reviewed and stable Respiratory status: spontaneous breathing, nonlabored ventilation, respiratory function stable and patient connected to nasal cannula oxygen Cardiovascular status: blood pressure returned to baseline and stable Postop Assessment: no signs of nausea or vomiting Anesthetic complications: no    Last Vitals:  Vitals:   08/11/16 1130 08/11/16 1202  BP: (!) 161/74 (!) 183/80  Pulse: 67 66  Resp: 19 20  Temp:  36.4 C    Last Pain:  Vitals:   08/11/16 1202  TempSrc: Oral  PainSc: 1                  DENENNY,BRUCE J

## 2016-08-11 NOTE — Anesthesia Preprocedure Evaluation (Addendum)
Anesthesia Evaluation  Patient identified by MRN, date of birth, ID band Patient awake    Reviewed: Allergy & Precautions, NPO status , Patient's Chart, lab work & pertinent test results  Airway Mallampati: II  TM Distance: >3 FB Neck ROM: Full    Dental  (+) Partial Upper, Partial Lower   Pulmonary asthma ,    Pulmonary exam normal breath sounds clear to auscultation       Cardiovascular hypertension, Pt. on medications Normal cardiovascular exam Rhythm:Regular Rate:Normal  ECG Normal   Neuro/Psych negative neurological ROS  negative psych ROS   GI/Hepatic negative GI ROS, Neg liver ROS,   Endo/Other  Hypothyroidism   Renal/GU negative Renal ROS  negative genitourinary   Musculoskeletal  (+) Arthritis ,   Abdominal (+) + obese,   Peds negative pediatric ROS (+)  Hematology negative hematology ROS (+)   Anesthesia Other Findings   Reproductive/Obstetrics negative OB ROS                           Anesthesia Physical Anesthesia Plan  ASA: II  Anesthesia Plan: General   Post-op Pain Management:    Induction: Intravenous  Airway Management Planned: LMA  Additional Equipment:   Intra-op Plan:   Post-operative Plan: Extubation in OR  Informed Consent: I have reviewed the patients History and Physical, chart, labs and discussed the procedure including the risks, benefits and alternatives for the proposed anesthesia with the patient or authorized representative who has indicated his/her understanding and acceptance.   Dental advisory given  Plan Discussed with: CRNA  Anesthesia Plan Comments:         Anesthesia Quick Evaluation

## 2016-08-11 NOTE — H&P (Signed)
Pre-op/Pre-procedure Orders Open    06/12/2016 [facility]  Erroll Luna, MD  General Surgery   Left breast mass  Dx   Additional Documentation   Encounter Info:   Billing Info,   History,   Allergies,   Detailed Report     All Notes   H&P by Erroll Luna, MD at 06/12/2016 5:07 PM   Author: Erroll Luna, MD Author Type: Physician Filed:   Note Status: Signed Cosign: Cosign Not Required Encounter Date:   Editor: Erroll Luna, MD (Physician)    Alisha [name]  Location: [facility] Patient #: [id number] DOB: February 11, 1935 Single / Language: [name] / Race: Black or African American Female  History of Present Illness Marcello Moores A. Cornett MD Patient words: left breast mass  Patient sent at the request of Dr. Lovey Newcomer for left breast mammographic abnormality left breast mass. Core biopsy showed a complex large lesion measuring 0.6 cm in maximal diameter. Patient denies any history of breast pain, nipple discharge or change in the appearance of her breast. She had 1 previous breast biopsy which was open which was benign. No family history of breast cancer.          CLINICAL DATA: Left breast upper outer quadrant architectural distortion seen on most recent screening mammography. EXAM: 2D DIGITAL DIAGNOSTIC LEFT MAMMOGRAM WITH CAD AND ADJUNCT TOMO ULTRASOUND LEFT BREAST COMPARISON: Previous exam(s). ACR Breast Density Category b: There are scattered areas of fibroglandular density. FINDINGS: Mammographically, there is a persistent sub cm area of architectural distortion in the left breast slightly upper outer quadrant, middle depth. No associated mass is seen. Mammographic images were processed with CAD. On physical exam, no suspicious masses are found. Targeted ultrasound is performed, showing hypoechoic irregular nodule with associated architectural distortion in the left 3 o'clock breast 4 cm from the nipple measuring 0.6  x 0.6 x 0.4 cm. This is thought to correspond to the mammographically seen area of architectural distortion. IMPRESSION: Left breast 3 o'clock 6 mm nodule with associated architectural distortion, for which ultrasound-guided core needle biopsy is recommended. RECOMMENDATION: Ultrasound-guided core needle biopsy of the left breast. I have discussed the findings and recommendations with the patient. Results were also provided in writing at the conclusion of the visit. If applicable, a reminder letter will be sent to the patient regarding the next appointment. BI-RADS CATEGORY 4: Suspicious. Electronically Signed By: [name] M.D. On: 05/14/2016 12:25                     Diagnosis Breast, left, needle core biopsy, 3:30 o'clock - COMPLEX SCLEROSING LESION. - SEE COMMENT.  The patient is a 81 year old female.   Other Problems [name], CMA Arthritis Asthma Back Pain High blood pressure Oophorectomy Left. Thyroid Disease  Past Surgical History [name], [place];  Breast Biopsy Left. Foot Surgery Left. Knee Surgery Left.  Diagnostic Studies History [name],  Colonoscopy 1-5 years ago Mammogram 1-3 years ago  Allergies [name], [place];  Benicar *ANTIHYPERTENSIVES* Lisinopril *ANTIHYPERTENSIVES* Norvasc *CALCIUM CHANNEL BLOCKERS* Penicillin G Pot in Dextrose *PENICILLINS* Sulfa 10 *OPHTHALMIC AGENTS*  Medication History [name], CMA;  Levothyroxine Sodium (88MCG Tablet, Oral) Active. HydroCHLOROthiazide ('25MG'$  Tablet, Oral) Active. Furosemide ('40MG'$  Tablet, Oral) Active. Potassium Chloride Crys ER (20MEQ Tablet ER, Oral) Active. Refresh Optive (0.5-0.9% Solution, Ophthalmic) Active. ProAir HFA (108 (90 Base)MCG/ACT Aerosol Soln, Inhalation) Active. Centrum Silver (Oral) Active. Probiotic (Oral) Active. Vitamin D3 (1000UNIT Tablet, Oral)  Active. Aleve ('220MG'$   Capsule, Oral) Active. Medications Reconciled  Social History [name], CMA; ) Caffeine use Coffee, Tea. No alcohol use Tobacco use Never smoker.  Family History [name], [place]; ) Alcohol Abuse Brother, Father, Sister. Arthritis Mother. Depression Sister. Diabetes Mellitus Brother, Sister. Migraine Headache Daughter. Respiratory Condition Sister.  Pregnancy / Birth History [name], [place];  3:04 PM) Age at menarche 89 years. Age of menopause 69-50 Gravida 2 Maternal age 3-20 Para 2    Vitals Sharyn Lull R. Brooks CMA;  06/12/2016 3:03 PM Weight: 218.13 lb Height: 64.5in Body Surface Area: 2.04 m Body Mass Index: 36.86 kg/m  BP: 132/86 (Sitting, Left Arm, Standard)      Physical Exam ([name] MD;)  General Mental Status-Alert. General Appearance-Consistent with stated age. Hydration-Well hydrated. Voice-Normal.  Head and Neck Head-normocephalic, atraumatic with no lesions or palpable masses. Trachea-midline. Thyroid Gland Characteristics - normal size and consistency.  Eye Eyeball - Bilateral-Extraocular movements intact. Sclera/Conjunctiva - Bilateral-No scleral icterus.  Chest and Lung Exam Chest and lung exam reveals -quiet, even and easy respiratory effort with no use of accessory muscles and on auscultation, normal breath sounds, no adventitious sounds and normal vocal resonance. Inspection Chest Wall - Normal. Back - normal.  Breast Note: No masses bilateral bilaterally. Previous scar to the left breast.  Cardiovascular Cardiovascular examination reveals -normal heart sounds, regular rate and rhythm with no murmurs and normal pedal pulses bilaterally.  Neurologic Neurologic evaluation reveals -alert and oriented x 3 with no impairment of recent or remote memory. Mental Status-Normal.  Musculoskeletal Normal Exam -  Left-Upper Extremity Strength Normal and Lower Extremity Strength Normal. Normal Exam - Right-Upper Extremity Strength Normal and Lower Extremity Strength Normal.  Lymphatic Head & Neck  General Head & Neck Lymphatics: Bilateral - Description - Normal. Axillary  General Axillary Region: Bilateral - Description - Normal. Tenderness - Non Tender.    Assessment & Plan ([name] MD;  MASS OF LEFT BREAST ON MAMMOGRAM (N63) Impression: Discussed options of left breast lumpectomy with seed Risk of lumpectomy include bleeding, infection, seroma, more surgery, use of seed/wire, wound care, cosmetic deformity and the need for other treatments, death , blood clots, death. Pt agrees to proceed. localization versus observation. Small risk of malignancy discussed with the patient in this setting. She is opted to undergo a left breast lumpectomy.  Current Plans Pt Education - [facility] Breast Biopsy HCI: discussed with patient and provided information. You are being scheduled for surgery - Our schedulers will call you.  You should hear from our office's scheduling department within 5 working days about the location, date, and time of surgery. We try to make accommodations for patient's preferences in scheduling surgery, but sometimes the OR schedule or the surgeon's schedule prevents [place] from making those accommodations.  If you have not heard from our office [phone]) in 5 working days, call the office and ask for your surgeon's nurse.  If you have other questions about your diagnosis, plan, or surgery, call the office and ask for your surgeon's nurse.  The anatomy and the physiology was discussed. The pathophysiology and natural history of the disease was discussed. Options were discussed and recommendations were made. Technique, risks, benefits, & alternatives were discussed. Risks such as stroke, heart attack, bleeding, indection, death, and other risks discussed. Questions  answered. The patient agrees to proceed.

## 2016-08-11 NOTE — Interval H&P Note (Signed)
History and Physical Interval Note:  08/11/2016 9:45 AM  Alisha [name]  has presented today for surgery, with the diagnosis of LEFT BREAST MASS  The various methods of treatment have been discussed with the patient and family. After consideration of risks, benefits and other options for treatment, the patient has consented to  Procedure(s): LEFT BREAST LUMPECTOMY WITH RADIOACTIVE SEED LOCALIZATION (Left) as a surgical intervention .  The patient's history has been reviewed, patient examined, no change in status, stable for surgery.  I have reviewed the patient's chart and labs.  Questions were answered to the patient's satisfaction.     CORNETT,THOMAS A.

## 2016-08-11 NOTE — Op Note (Signed)
Preoperative diagnosis: Left breast complex sclerosing lesion  Postoperative diagnosis: Same   Procedure: Left breast seed localized lumpectomy  Surgeon: Erroll Luna M.D.  Anesthesia: Gen. With 0.25% Sensorcaine local with epinephrine   EBL: 20 cc  Specimen: Left breast tissue with clip and radioactive seed in the specimen. Verified with neoprobe and radiographic image showing both seed and clip in specimen  Indications for procedure: The patient presents for left breast  lumpectomy after core biopsy showed CSL. Discussed the rationale for considering excision. Small risk of malignancy associated with THIS  lesion after core biopsy. Discussed observation. Discussed wire localization and seed use. . Patient desired excision of left breast CSL.The procedure has been discussed with the patient. Alternatives to surgery have been discussed with the patient.  Risks of surgery include bleeding,  Infection,  Seroma formation, death,  and the need for further surgery.   The patient understands and wishes to proceed.   Description of procedure: Patient underwent seed placement as an outpatient. Patient presents today for left breast seed localized lumpectomy. Patient SEEN IN  holding area. Questions are answered and neoprobe used to verify seed location. Patient taken back to the operating room and placed upon the OR table. After induction of general anesthesia, left breast prepped and draped in a sterile fashion. Timeout was done to verify proper side and  procedure. Neoprobe used and hot spot identified in the  left breast upper-outer quadrant. This was marked with pen. Curvilinear incision made left upper outer quadrant breast. Dissection used with the help of a neoprobe around the tissue where the seed and clip were located. Tissue removed in its entirety with gross  Negative margins. Neoprobe used and seed within specimen. Radiographs taken which show clip and seed in specimen..  Hemostasis achieved  and cavity closed with 3-0 Vicryl and 4-0 Monocryl. Dermabond applied. All final counts found to be correct. Specimen transported to pathology. Patient awoke extubated taken to recovery in satisfactory condition.

## 2016-08-11 NOTE — Anesthesia Procedure Notes (Addendum)
Procedure Name: LMA Insertion Date/Time: 08/11/2016 10:00 AM Performed by: Toula Moos L Pre-anesthesia Checklist: Patient identified, Emergency Drugs available, Suction available, Patient being monitored and Timeout performed Patient Re-evaluated:Patient Re-evaluated prior to inductionOxygen Delivery Method: Circle system utilized Preoxygenation: Pre-oxygenation with 100% oxygen Intubation Type: IV induction Ventilation: Mask ventilation without difficulty LMA: LMA inserted LMA Size: 4.0 Number of attempts: 1 Airway Equipment and Method: Bite block Placement Confirmation: positive ETCO2 Tube secured with: Tape Dental Injury: Teeth and Oropharynx as per pre-operative assessment

## 2016-08-11 NOTE — Discharge Instructions (Signed)
Post Anesthesia Home Care Instructions  Activity: Get plenty of rest for the remainder of the day. A responsible adult should stay with you for 24 hours following the procedure.  For the next 24 hours, DO NOT: -Drive a car -Paediatric nurse -Drink alcoholic beverages -Take any medication unless instructed by your physician -Make any legal decisions or sign important papers.  Meals: Start with liquid foods such as gelatin or soup. Progress to regular foods as tolerated. Avoid greasy, spicy, heavy foods. If nausea and/or vomiting occur, drink only clear liquids until the nausea and/or vomiting subsides. Call your physician if vomiting continues.  Special Instructions/Symptoms: Your throat may feel dry or sore from the anesthesia or the breathing tube placed in your throat during surgery. If this causes discomfort, gargle with warm salt water. The discomfort should disappear within 24 hours.  If you had a scopolamine patch placed behind your ear for the management of post- operative nausea and/or vomiting:  1. The medication in the patch is effective for 72 hours, after which it should be removed.  Wrap patch in a tissue and discard in the trash. Wash hands thoroughly with soap and water. 2. You may remove the patch earlier than 72 hours if you experience unpleasant side effects which may include dry mouth, dizziness or visual disturbances. 3. Avoid touching the patch. Wash your hands with soap and water after contact with the patch.    Post Anesthesia Home Care Instructions  Activity: Get plenty of rest for the remainder of the day. A responsible adult should stay with you for 24 hours following the procedure.  For the next 24 hours, DO NOT: -Drive a car -Paediatric nurse -Drink alcoholic beverages -Take any medication unless instructed by your physician -Make any legal decisions or sign important papers.  Meals: Start with liquid foods such as gelatin or soup. Progress to  regular foods as tolerated. Avoid greasy, spicy, heavy foods. If nausea and/or vomiting occur, drink only clear liquids until the nausea and/or vomiting subsides. Call your physician if vomiting continues.  Special Instructions/Symptoms: Your throat may feel dry or sore from the anesthesia or the breathing tube placed in your throat during surgery. If this causes discomfort, gargle with warm salt water. The discomfort should disappear within 24 hours.  If you had a scopolamine patch placed behind your ear for the management of post- operative nausea and/or vomiting:  1. The medication in the patch is effective for 72 hours, after which it should be removed.  Wrap patch in a tissue and discard in the trash. Wash hands thoroughly with soap and water. 2. You may remove the patch earlier than 72 hours if you experience unpleasant side effects which may include dry mouth, dizziness or visual disturbances. 3. Avoid touching the patch. Wash your hands with soap and water after contact with the patch.      [place] Office Phone Number [phone]  BREAST BIOPSY/ PARTIAL MASTECTOMY: POST OP INSTRUCTIONS  Always review your discharge instruction sheet given to you by the facility where your surgery was performed.  IF YOU HAVE DISABILITY OR FAMILY LEAVE FORMS, YOU MUST BRING THEM TO THE OFFICE FOR PROCESSING.  DO NOT GIVE THEM TO YOUR DOCTOR.  1. A prescription for pain medication may be given to you upon discharge.  Take your pain medication as prescribed, if needed.  If narcotic pain medicine is not needed, then you may take acetaminophen (Tylenol) or ibuprofen (Advil) as needed. 2. Take your usually prescribed medications  unless otherwise directed 3. If you need a refill on your pain medication, please contact your pharmacy.  They will contact our office to request authorization.  Prescriptions will not be filled after 5pm or on week-ends. 4. You should eat very light the first  24 hours after surgery, such as soup, crackers, pudding, etc.  Resume your normal diet the day after surgery. 5. Most patients will experience some swelling and bruising in the breast.  Ice packs and a good support bra will help.  Swelling and bruising can take several days to resolve.  6. It is common to experience some constipation if taking pain medication after surgery.  Increasing fluid intake and taking a stool softener will usually help or prevent this problem from occurring.  A mild laxative (Milk of Magnesia or Miralax) should be taken according to package directions if there are no bowel movements after 48 hours. 7. Unless discharge instructions indicate otherwise, you may remove your bandages 24-48 hours after surgery, and you may shower at that time.  You may have steri-strips (small skin tapes) in place directly over the incision.  These strips should be left on the skin for 7-10 days.  If your surgeon used skin glue on the incision, you may shower in 24 hours.  The glue will flake off over the next 2-3 weeks.  Any sutures or staples will be removed at the office during your follow-up visit. 8. ACTIVITIES:  You may resume regular daily activities (gradually increasing) beginning the next day.  Wearing a good support bra or sports bra minimizes pain and swelling.  You may have sexual intercourse when it is comfortable. a. You may drive when you no longer are taking prescription pain medication, you can comfortably wear a seatbelt, and you can safely maneuver your car and apply brakes. b. RETURN TO WORK:  ______________________________________________________________________________________ 9. You should see your doctor in the office for a follow-up appointment approximately two weeks after your surgery.  Your doctors nurse will typically make your follow-up appointment when she calls you with your pathology report.  Expect your pathology report 2-3 business days after your surgery.  You may call  to check if you do not hear from [place] after three days. 10. OTHER INSTRUCTIONS: _______________________________________________________________________________________________ _____________________________________________________________________________________________________________________________________ _____________________________________________________________________________________________________________________________________ _____________________________________________________________________________________________________________________________________  WHEN TO CALL YOUR DOCTOR: 1. Fever over 101.0 2. Nausea and/or vomiting. 3. Extreme swelling or bruising. 4. Continued bleeding from incision. 5. Increased pain, redness, or drainage from the incision.  The clinic staff is available to answer your questions during regular business hours.  Please dont hesitate to call and ask to speak to one of the nurses for clinical concerns.  If you have a medical emergency, go to the nearest emergency room or call 911.  A surgeon from [facility] is always on call at the hospital.  For further questions, please visit [email or web address]

## 2016-08-12 ENCOUNTER — Encounter (HOSPITAL_BASED_OUTPATIENT_CLINIC_OR_DEPARTMENT_OTHER): Payer: Self-pay | Admitting: Surgery

## 2016-08-28 DIAGNOSIS — Z23 Encounter for immunization: Secondary | ICD-10-CM | POA: Diagnosis not present

## 2016-08-28 DIAGNOSIS — E039 Hypothyroidism, unspecified: Secondary | ICD-10-CM | POA: Diagnosis not present

## 2016-08-28 DIAGNOSIS — H269 Unspecified cataract: Secondary | ICD-10-CM | POA: Diagnosis not present

## 2016-08-28 DIAGNOSIS — R739 Hyperglycemia, unspecified: Secondary | ICD-10-CM | POA: Diagnosis not present

## 2016-08-28 DIAGNOSIS — I10 Essential (primary) hypertension: Secondary | ICD-10-CM | POA: Diagnosis not present

## 2016-09-09 DIAGNOSIS — H25012 Cortical age-related cataract, left eye: Secondary | ICD-10-CM | POA: Diagnosis not present

## 2016-09-09 DIAGNOSIS — H2512 Age-related nuclear cataract, left eye: Secondary | ICD-10-CM | POA: Diagnosis not present

## 2016-09-16 DIAGNOSIS — H2512 Age-related nuclear cataract, left eye: Secondary | ICD-10-CM | POA: Diagnosis not present

## 2016-09-16 DIAGNOSIS — H25812 Combined forms of age-related cataract, left eye: Secondary | ICD-10-CM | POA: Diagnosis not present

## 2016-09-16 DIAGNOSIS — H25012 Cortical age-related cataract, left eye: Secondary | ICD-10-CM | POA: Diagnosis not present

## 2016-11-12 DIAGNOSIS — I10 Essential (primary) hypertension: Secondary | ICD-10-CM | POA: Diagnosis not present

## 2016-11-12 DIAGNOSIS — E039 Hypothyroidism, unspecified: Secondary | ICD-10-CM | POA: Diagnosis not present

## 2016-11-12 DIAGNOSIS — R7302 Impaired glucose tolerance (oral): Secondary | ICD-10-CM | POA: Diagnosis not present

## 2016-11-14 DIAGNOSIS — R609 Edema, unspecified: Secondary | ICD-10-CM | POA: Diagnosis not present

## 2016-11-14 DIAGNOSIS — I10 Essential (primary) hypertension: Secondary | ICD-10-CM | POA: Diagnosis not present

## 2016-11-14 DIAGNOSIS — E785 Hyperlipidemia, unspecified: Secondary | ICD-10-CM | POA: Diagnosis not present

## 2016-11-14 DIAGNOSIS — E039 Hypothyroidism, unspecified: Secondary | ICD-10-CM | POA: Diagnosis not present

## 2016-11-26 DIAGNOSIS — H402231 Chronic angle-closure glaucoma, bilateral, mild stage: Secondary | ICD-10-CM | POA: Diagnosis not present

## 2016-11-26 DIAGNOSIS — Z961 Presence of intraocular lens: Secondary | ICD-10-CM | POA: Diagnosis not present

## 2017-01-26 DIAGNOSIS — M79671 Pain in right foot: Secondary | ICD-10-CM | POA: Diagnosis not present

## 2017-01-26 DIAGNOSIS — L97521 Non-pressure chronic ulcer of other part of left foot limited to breakdown of skin: Secondary | ICD-10-CM | POA: Diagnosis not present

## 2017-01-26 DIAGNOSIS — M79672 Pain in left foot: Secondary | ICD-10-CM | POA: Diagnosis not present

## 2017-01-26 DIAGNOSIS — B079 Viral wart, unspecified: Secondary | ICD-10-CM | POA: Diagnosis not present

## 2017-02-16 DIAGNOSIS — H402231 Chronic angle-closure glaucoma, bilateral, mild stage: Secondary | ICD-10-CM | POA: Diagnosis not present

## 2017-02-16 DIAGNOSIS — H524 Presbyopia: Secondary | ICD-10-CM | POA: Diagnosis not present

## 2017-02-16 DIAGNOSIS — H04123 Dry eye syndrome of bilateral lacrimal glands: Secondary | ICD-10-CM | POA: Diagnosis not present

## 2017-02-16 DIAGNOSIS — H109 Unspecified conjunctivitis: Secondary | ICD-10-CM | POA: Diagnosis not present

## 2017-03-02 DIAGNOSIS — Z0000 Encounter for general adult medical examination without abnormal findings: Secondary | ICD-10-CM | POA: Diagnosis not present

## 2017-03-02 DIAGNOSIS — E785 Hyperlipidemia, unspecified: Secondary | ICD-10-CM | POA: Diagnosis not present

## 2017-03-02 DIAGNOSIS — R739 Hyperglycemia, unspecified: Secondary | ICD-10-CM | POA: Diagnosis not present

## 2017-03-02 DIAGNOSIS — I10 Essential (primary) hypertension: Secondary | ICD-10-CM | POA: Diagnosis not present

## 2017-04-23 DIAGNOSIS — E7800 Pure hypercholesterolemia, unspecified: Secondary | ICD-10-CM | POA: Diagnosis not present

## 2017-04-23 DIAGNOSIS — E612 Magnesium deficiency: Secondary | ICD-10-CM | POA: Diagnosis not present

## 2017-04-23 DIAGNOSIS — E039 Hypothyroidism, unspecified: Secondary | ICD-10-CM | POA: Diagnosis not present

## 2017-04-23 DIAGNOSIS — I10 Essential (primary) hypertension: Secondary | ICD-10-CM | POA: Diagnosis not present

## 2017-05-08 DIAGNOSIS — N8111 Cystocele, midline: Secondary | ICD-10-CM | POA: Insufficient documentation

## 2017-05-08 DIAGNOSIS — Z1231 Encounter for screening mammogram for malignant neoplasm of breast: Secondary | ICD-10-CM | POA: Diagnosis not present

## 2017-05-08 DIAGNOSIS — Z01419 Encounter for gynecological examination (general) (routine) without abnormal findings: Secondary | ICD-10-CM | POA: Diagnosis not present

## 2017-05-29 DIAGNOSIS — I119 Hypertensive heart disease without heart failure: Secondary | ICD-10-CM | POA: Diagnosis not present

## 2017-05-29 DIAGNOSIS — E039 Hypothyroidism, unspecified: Secondary | ICD-10-CM | POA: Diagnosis not present

## 2017-05-29 DIAGNOSIS — E668 Other obesity: Secondary | ICD-10-CM | POA: Diagnosis not present

## 2017-05-29 DIAGNOSIS — I10 Essential (primary) hypertension: Secondary | ICD-10-CM | POA: Diagnosis not present

## 2017-06-08 DIAGNOSIS — I10 Essential (primary) hypertension: Secondary | ICD-10-CM | POA: Diagnosis not present

## 2017-06-08 DIAGNOSIS — E039 Hypothyroidism, unspecified: Secondary | ICD-10-CM | POA: Diagnosis not present

## 2017-06-15 DIAGNOSIS — I119 Hypertensive heart disease without heart failure: Secondary | ICD-10-CM | POA: Diagnosis not present

## 2017-06-15 DIAGNOSIS — E668 Other obesity: Secondary | ICD-10-CM | POA: Diagnosis not present

## 2017-06-15 DIAGNOSIS — E039 Hypothyroidism, unspecified: Secondary | ICD-10-CM | POA: Diagnosis not present

## 2017-07-01 DIAGNOSIS — H353132 Nonexudative age-related macular degeneration, bilateral, intermediate dry stage: Secondary | ICD-10-CM | POA: Diagnosis not present

## 2017-07-01 DIAGNOSIS — Z961 Presence of intraocular lens: Secondary | ICD-10-CM | POA: Diagnosis not present

## 2017-07-01 DIAGNOSIS — H35033 Hypertensive retinopathy, bilateral: Secondary | ICD-10-CM | POA: Diagnosis not present

## 2017-07-01 DIAGNOSIS — H402231 Chronic angle-closure glaucoma, bilateral, mild stage: Secondary | ICD-10-CM | POA: Diagnosis not present

## 2017-08-26 DIAGNOSIS — I10 Essential (primary) hypertension: Secondary | ICD-10-CM | POA: Diagnosis not present

## 2017-08-26 DIAGNOSIS — R7982 Elevated C-reactive protein (CRP): Secondary | ICD-10-CM | POA: Diagnosis not present

## 2017-08-26 DIAGNOSIS — E039 Hypothyroidism, unspecified: Secondary | ICD-10-CM | POA: Diagnosis not present

## 2017-08-26 DIAGNOSIS — D509 Iron deficiency anemia, unspecified: Secondary | ICD-10-CM | POA: Diagnosis not present

## 2017-08-26 DIAGNOSIS — L659 Nonscarring hair loss, unspecified: Secondary | ICD-10-CM | POA: Diagnosis not present

## 2017-09-04 DIAGNOSIS — R739 Hyperglycemia, unspecified: Secondary | ICD-10-CM | POA: Diagnosis not present

## 2017-09-04 DIAGNOSIS — I10 Essential (primary) hypertension: Secondary | ICD-10-CM | POA: Diagnosis not present

## 2017-09-04 DIAGNOSIS — Z23 Encounter for immunization: Secondary | ICD-10-CM | POA: Diagnosis not present

## 2017-09-04 DIAGNOSIS — M542 Cervicalgia: Secondary | ICD-10-CM | POA: Diagnosis not present

## 2017-09-04 DIAGNOSIS — E039 Hypothyroidism, unspecified: Secondary | ICD-10-CM | POA: Diagnosis not present

## 2017-09-04 DIAGNOSIS — J45998 Other asthma: Secondary | ICD-10-CM | POA: Diagnosis not present

## 2017-09-23 DIAGNOSIS — I119 Hypertensive heart disease without heart failure: Secondary | ICD-10-CM | POA: Diagnosis not present

## 2017-10-28 DIAGNOSIS — E039 Hypothyroidism, unspecified: Secondary | ICD-10-CM | POA: Diagnosis not present

## 2017-10-29 DIAGNOSIS — E668 Other obesity: Secondary | ICD-10-CM | POA: Diagnosis not present

## 2017-10-29 DIAGNOSIS — E039 Hypothyroidism, unspecified: Secondary | ICD-10-CM | POA: Diagnosis not present

## 2017-10-29 DIAGNOSIS — I119 Hypertensive heart disease without heart failure: Secondary | ICD-10-CM | POA: Diagnosis not present

## 2017-11-04 DIAGNOSIS — B349 Viral infection, unspecified: Secondary | ICD-10-CM | POA: Diagnosis not present

## 2017-11-24 DIAGNOSIS — R5383 Other fatigue: Secondary | ICD-10-CM | POA: Diagnosis not present

## 2017-11-24 DIAGNOSIS — E039 Hypothyroidism, unspecified: Secondary | ICD-10-CM | POA: Diagnosis not present

## 2017-11-24 DIAGNOSIS — R739 Hyperglycemia, unspecified: Secondary | ICD-10-CM | POA: Diagnosis not present

## 2017-11-24 DIAGNOSIS — L853 Xerosis cutis: Secondary | ICD-10-CM | POA: Diagnosis not present

## 2017-11-24 DIAGNOSIS — L659 Nonscarring hair loss, unspecified: Secondary | ICD-10-CM | POA: Diagnosis not present

## 2017-11-24 DIAGNOSIS — I10 Essential (primary) hypertension: Secondary | ICD-10-CM | POA: Diagnosis not present

## 2018-02-15 DIAGNOSIS — R5383 Other fatigue: Secondary | ICD-10-CM | POA: Diagnosis not present

## 2018-02-15 DIAGNOSIS — L853 Xerosis cutis: Secondary | ICD-10-CM | POA: Diagnosis not present

## 2018-02-15 DIAGNOSIS — L659 Nonscarring hair loss, unspecified: Secondary | ICD-10-CM | POA: Diagnosis not present

## 2018-02-15 DIAGNOSIS — E039 Hypothyroidism, unspecified: Secondary | ICD-10-CM | POA: Diagnosis not present

## 2018-03-05 DIAGNOSIS — R739 Hyperglycemia, unspecified: Secondary | ICD-10-CM | POA: Diagnosis not present

## 2018-03-05 DIAGNOSIS — I10 Essential (primary) hypertension: Secondary | ICD-10-CM | POA: Diagnosis not present

## 2018-03-05 DIAGNOSIS — Z1211 Encounter for screening for malignant neoplasm of colon: Secondary | ICD-10-CM | POA: Diagnosis not present

## 2018-03-05 DIAGNOSIS — Z780 Asymptomatic menopausal state: Secondary | ICD-10-CM | POA: Diagnosis not present

## 2018-03-05 DIAGNOSIS — Z0000 Encounter for general adult medical examination without abnormal findings: Secondary | ICD-10-CM | POA: Diagnosis not present

## 2018-04-01 DIAGNOSIS — H04123 Dry eye syndrome of bilateral lacrimal glands: Secondary | ICD-10-CM | POA: Diagnosis not present

## 2018-04-01 DIAGNOSIS — H109 Unspecified conjunctivitis: Secondary | ICD-10-CM | POA: Diagnosis not present

## 2018-04-01 DIAGNOSIS — H5712 Ocular pain, left eye: Secondary | ICD-10-CM | POA: Diagnosis not present

## 2018-04-01 DIAGNOSIS — H402231 Chronic angle-closure glaucoma, bilateral, mild stage: Secondary | ICD-10-CM | POA: Diagnosis not present

## 2018-04-08 ENCOUNTER — Other Ambulatory Visit: Payer: Self-pay | Admitting: Family Medicine

## 2018-04-08 DIAGNOSIS — E2839 Other primary ovarian failure: Secondary | ICD-10-CM

## 2018-04-09 DIAGNOSIS — E039 Hypothyroidism, unspecified: Secondary | ICD-10-CM | POA: Diagnosis not present

## 2018-05-19 DIAGNOSIS — R252 Cramp and spasm: Secondary | ICD-10-CM | POA: Diagnosis not present

## 2018-05-19 DIAGNOSIS — R5383 Other fatigue: Secondary | ICD-10-CM | POA: Diagnosis not present

## 2018-05-19 DIAGNOSIS — H5789 Other specified disorders of eye and adnexa: Secondary | ICD-10-CM | POA: Diagnosis not present

## 2018-05-19 DIAGNOSIS — E039 Hypothyroidism, unspecified: Secondary | ICD-10-CM | POA: Diagnosis not present

## 2018-05-19 DIAGNOSIS — R7309 Other abnormal glucose: Secondary | ICD-10-CM | POA: Diagnosis not present

## 2018-05-21 ENCOUNTER — Other Ambulatory Visit: Payer: Commercial Managed Care - HMO

## 2018-06-11 DIAGNOSIS — I119 Hypertensive heart disease without heart failure: Secondary | ICD-10-CM | POA: Diagnosis not present

## 2018-06-11 DIAGNOSIS — E039 Hypothyroidism, unspecified: Secondary | ICD-10-CM | POA: Diagnosis not present

## 2018-06-11 DIAGNOSIS — E668 Other obesity: Secondary | ICD-10-CM | POA: Diagnosis not present

## 2018-07-01 ENCOUNTER — Ambulatory Visit (HOSPITAL_COMMUNITY)
Admission: EM | Admit: 2018-07-01 | Discharge: 2018-07-01 | Disposition: A | Discharge status: Home / Self Care | Payer: Medicare HMO

## 2018-07-01 ENCOUNTER — Other Ambulatory Visit: Payer: Self-pay

## 2018-07-01 ENCOUNTER — Encounter (HOSPITAL_COMMUNITY): Payer: Self-pay | Admitting: Emergency Medicine

## 2018-07-01 DIAGNOSIS — I160 Hypertensive urgency: Secondary | ICD-10-CM

## 2018-07-01 MED ORDER — CLONIDINE HCL 0.1 MG PO TABS
0.2000 mg | ORAL_TABLET | Freq: Once | ORAL | Status: AC
  Administered 2018-07-01: 0.2 mg via ORAL

## 2018-07-01 MED ORDER — CLONIDINE HCL 0.1 MG PO TABS
ORAL_TABLET | ORAL | Status: AC
  Filled 2018-07-01: qty 2

## 2018-07-01 NOTE — Discharge Instructions (Addendum)
Your blood pressure has improved significantly with the medications we gave you today in the clinic.  Please pick up your prescription tomorrow from the pharmacy and start taking immediately.  Continue checking her blood pressure as you have.  Follow-up with your cardiologist next week.

## 2018-07-01 NOTE — ED Triage Notes (Addendum)
complains of dizziness all day.  Patient checks blood pressure at home and says it is running high.  No pain, but vomiting today.    Cardiologist called in [place] unable to get until tomorrow

## 2018-07-01 NOTE — ED Provider Notes (Signed)
[facility]    CSN: [id number] Arrival date & time: 07/01/18  1726     History   Chief Complaint Chief Complaint  Patient presents with  . Hypertension    HPI Alisha [name] is a 83 y.o. female.   History of Present Illness  Alisha [name] is a 84 y.o. female that presents for evaluation of elevated BP measurement. Onset was a few months ago and has been unchanged since that time. Symptoms also include dyspnea, nausea, vomiting and dizziness. The patient specifically denies any chest pain, headache, blurred vision, palpitations, peripheral edema, limb paraesthesias, slurred speech or unilateral weakness. Her cardiovascular risk factors include advanced age, history of hypertension, obesity and sedentary lifestyle. Her family history is negative for hypertension, diabetes, or cardiovascular disease. Agents associated with hypertension are none.  Notably, the patient reports that she has been tried on 5 different antihypertensive medications over the past couple of months.  She has not been able to tolerate any of them so far because of adverse side effects. The last medication (patient unsure of the name) caused dizziness and malaise. She has just recently stopped this medication. The patient's cardiologist called her in a new medication just today (patient unsure of the name); however, the pharmacy will not have this medication ready until tomorrow.  The patient reports that she checks her blood pressure several times a day and that her systolic blood pressure has been running in the 200s for the past month or so intermittently. She started having worsening dizziness today which prompted her to visit the urgent care for evaluation.          Past Medical History:  Diagnosis Date  . Arthritis   . Asthma   . Blood transfusion    over 40 y ears  . Breast mass, left   . Hypertension    on meds  . Hypothyroidism    on meds    Patient Active Problem List   Diagnosis Date Noted  . Arthritis of knee 02/20/2012    Past Surgical History:  Procedure Laterality Date  . ABDOMINAL HYSTERECTOMY     30 yrs  . ABDOMINAL SURGERY     at age 22  . BREAST LUMPECTOMY WITH RADIOACTIVE SEED LOCALIZATION Left 08/11/2016   Procedure: LEFT BREAST LUMPECTOMY WITH RADIOACTIVE SEED LOCALIZATION;  Surgeon: Erroll Luna, MD;  Location: [facility];  Service: General;  Laterality: Left;  . EYE SURGERY    . JOINT REPLACEMENT Bilateral    left knee 2012  . KNEE ARTHROPLASTY  02/17/2012   Procedure: COMPUTER ASSISTED TOTAL KNEE ARTHROPLASTY;  Surgeon: Meredith Pel, MD;  Location: [facility];  Service: Orthopedics;  Laterality: Right;  Right total knee replacement    OB History   None      Home Medications    Prior to Admission medications   Medication Sig Start Date End Date Taking? Authorizing Provider  BIOTIN PO Take by mouth.   Yes [provider]  Calcium Carbonate-Vitamin D (CALTRATE COLON HEALTH PO) Take by mouth.   Yes [provider]  levothyroxine (SYNTHROID, LEVOTHROID) 88 MCG tablet Take 100 mcg by mouth daily.    Yes [provider]  Naphazoline-Pheniramine (VISINE-A OP) Apply to eye.   Yes [provider]  naproxen sodium (ALEVE) 220 MG tablet Take 220 mg by mouth.   Yes [provider]  spironolactone (ALDACTONE) 50 MG tablet Take 50 mg by mouth daily.   Yes [provider]  tizanidine (ZANAFLEX) 2 MG capsule Take 2 mg by mouth 3 (three) times daily.   Yes [provider]  albuterol (PROVENTIL HFA;VENTOLIN HFA) 108 (90 Base) MCG/ACT inhaler Inhale 1 puff into the lungs every 6 (six) hours as needed for wheezing or shortness of breath.    [provider]  dorzolamide (TRUSOPT) 2 % ophthalmic solution 1 drop 3 (three) times daily.    [provider]  furosemide (LASIX) 40 MG tablet Take 40 mg by mouth.    [provider]  hydrochlorothiazide  (HYDRODIURIL) 25 MG tablet Take 25 mg by mouth daily.    [provider]  HYDROcodone-acetaminophen (NORCO) 5-325 MG tablet Take 1-2 tablets by mouth every 6 (six) hours as needed for moderate pain. 08/11/16   Cornett, Marcello Moores, MD  potassium chloride SA (K-DUR,KLOR-CON) 20 MEQ tablet Take 20 mEq by mouth 2 (two) times daily.    [provider]    Family History Family History  Problem Relation Age of Onset  . Anesthesia problems Neg Hx   . Hypotension Neg Hx   . Malignant hyperthermia Neg Hx   . Pseudochol deficiency Neg Hx     Social History Social History   Tobacco Use  . Smoking status: Never Smoker  . Smokeless tobacco: Never Used  Substance Use Topics  . Alcohol use: No  . Drug use: No     Allergies   Lisinopril; Penicillins; and Sulfa antibiotics   Review of Systems Review of Systems  Eyes: Negative for visual disturbance.  Respiratory: Positive for shortness of breath.   Cardiovascular: Negative for chest pain, palpitations and leg swelling.  Gastrointestinal: Positive for nausea and vomiting. Negative for abdominal pain and diarrhea.  Musculoskeletal: Negative for gait problem.  Neurological: Positive for dizziness. Negative for facial asymmetry, speech difficulty, numbness and headaches.  All other systems reviewed and are negative.    Physical Exam Triage Vital Signs ED Triage Vitals [07/01/18 1809]  Enc Vitals Group     BP (!) 227/78     Pulse Rate 91     Resp 20     Temp 98.2 F (36.8 C)     Temp Source Oral     SpO2 98 %     Weight      Height      Head Circumference      Peak Flow      Pain Score 0     Pain Loc      Pain Edu?      Excl. in [place]?    No data found.  Updated Vital Signs BP (!) 177/76 (BP Location: Right Arm)   Pulse 91   Temp 98.2 F (36.8 C) (Oral)   Resp 20   SpO2 98%   Visual Acuity Right Eye Distance:   Left Eye Distance:   Bilateral Distance:    Right Eye Near:   Left Eye Near:    Bilateral  Near:     Physical Exam  Constitutional: She is oriented to person, place, and time. She appears well-developed and well-nourished.  Neck: Normal range of motion. Neck supple.  Cardiovascular: Normal rate, regular rhythm, normal heart sounds and intact distal pulses.  Pulmonary/Chest: Effort normal and breath sounds normal.  Abdominal: Soft. Bowel sounds are normal.  Musculoskeletal: Normal range of motion. She exhibits no edema.  Neurological: She is alert and oriented to person, place, and time. No cranial nerve deficit.  Skin: Skin is warm and dry.  Psychiatric: She has a normal  mood and affect. Her behavior is normal.     UC Treatments / Results  Labs (all labs ordered are listed, but only abnormal results are displayed) Labs Reviewed - No data to display  EKG None  Radiology No results found.  Procedures Procedures (including critical care time)  Medications Ordered in UC Medications  cloNIDine (CATAPRES) tablet 0.2 mg (0.2 mg Oral Given 07/01/18 1854)    Initial Impression / Assessment and Plan / UC Course  I have reviewed the triage vital signs and the nursing notes.  Pertinent labs & imaging results that were available during my care of the patient were reviewed by me and considered in my medical decision making (see chart for details).    83 year old female with history of hypertension presents with elevated blood pressures.  She has had problems with regulating her blood pressure for the past couple months.  She is currently being evaluated by her cardiologist who has tried several different medications; unfortunately, patient has been unable to tolerate them due to adverse side effects.  The patient has stopped her most recent medication and has been unable to start the new medication as it is not ready to be picked up from the pharmacy until tomorrow.  Patient reports occasional dizziness and shortness of breath.  Notably, patient attributes dyspnea to her asthma.  She denies any significant change from her usual. She denies any chest pain, headache, blurred vision, palpitations, peripheral edema, limb paresthesias, slurred speech or unilateral weakness.  Patient is alert and oriented x3.  There are no focal neurological deficits noted.  Patient presented extremely hypertensive at 227/78.  All other vitals were within normal parameters.  Patient was given clonidine 0.2 mg p.o. with improved BP of 177/76. Patient reports that the nausea and dizziness is much better.  She will be discharged home with strict follow-up precautions.  Patient strongly advised to pick up antihypertensive therapy in the morning and begin to take immediately.  Follow-up with her cardiologist next week.  Today's evaluation has revealed no signs of a dangerous process. Discussed diagnosis with patient. Patient aware of their diagnosis, possible red flag symptoms to watch out for and need for close follow up. Patient understands verbal and written discharge instructions. Patient comfortable with plan and disposition.  Patient has a clear mental status at this time, good insight into illness (after discussion and teaching) and has clear judgment to make decisions regarding their care.  Documentation was completed with the aid of voice recognition software. Transcription may contain typographical errors. Final Clinical Impressions(s) / UC Diagnoses   Final diagnoses:  Hypertensive urgency     Discharge Instructions     Your blood pressure has improved significantly with the medications we gave you today in the clinic.  Please pick up your prescription tomorrow from the pharmacy and start taking immediately.  Continue checking her blood pressure as you have.  Follow-up with your cardiologist next week.    ED Prescriptions    None     Controlled Substance Prescriptions [place] Controlled Substance Registry consulted? Not Applicable   Enrique Sack, FNP 07/01/18 2001

## 2018-07-02 ENCOUNTER — Emergency Department (HOSPITAL_COMMUNITY): Payer: Medicare HMO

## 2018-07-02 ENCOUNTER — Emergency Department (HOSPITAL_COMMUNITY)
Admission: EM | Admit: 2018-07-02 | Discharge: 2018-07-02 | Disposition: A | Discharge status: Home / Self Care | Payer: Medicare HMO | Attending: Emergency Medicine | Admitting: Emergency Medicine

## 2018-07-02 ENCOUNTER — Encounter (HOSPITAL_COMMUNITY): Payer: Self-pay | Admitting: *Deleted

## 2018-07-02 DIAGNOSIS — R42 Dizziness and giddiness: Secondary | ICD-10-CM

## 2018-07-02 DIAGNOSIS — I10 Essential (primary) hypertension: Secondary | ICD-10-CM | POA: Diagnosis not present

## 2018-07-02 DIAGNOSIS — R51 Headache: Secondary | ICD-10-CM | POA: Insufficient documentation

## 2018-07-02 DIAGNOSIS — Z79899 Other long term (current) drug therapy: Secondary | ICD-10-CM | POA: Diagnosis not present

## 2018-07-02 DIAGNOSIS — J45909 Unspecified asthma, uncomplicated: Secondary | ICD-10-CM | POA: Insufficient documentation

## 2018-07-02 DIAGNOSIS — E039 Hypothyroidism, unspecified: Secondary | ICD-10-CM | POA: Insufficient documentation

## 2018-07-02 LAB — CBC
HCT: 39.6 % (ref 36.0–46.0)
Hemoglobin: 12.4 g/dL (ref 12.0–15.0)
MCH: 30.8 pg (ref 26.0–34.0)
MCHC: 31.3 g/dL (ref 30.0–36.0)
MCV: 98.3 fL (ref 78.0–100.0)
Platelets: 260 10*3/uL (ref 150–400)
RBC: 4.03 MIL/uL (ref 3.87–5.11)
RDW: 13 % (ref 11.5–15.5)
WBC: 11.5 10*3/uL — ABNORMAL HIGH (ref 4.0–10.5)

## 2018-07-02 LAB — COMPREHENSIVE METABOLIC PANEL
ALT: 18 U/L (ref 0–44)
AST: 20 U/L (ref 15–41)
Albumin: 4 g/dL (ref 3.5–5.0)
Alkaline Phosphatase: 66 U/L (ref 38–126)
Anion gap: 10 (ref 5–15)
BUN: 10 mg/dL (ref 8–23)
CO2: 22 mmol/L (ref 22–32)
Calcium: 9 mg/dL (ref 8.9–10.3)
Chloride: 109 mmol/L (ref 98–111)
Creatinine, Ser: 0.65 mg/dL (ref 0.44–1.00)
GFR calc Af Amer: >60 mL/min (ref >60)
GFR calc non Af Amer: >60 mL/min (ref >60)
Glucose, Bld: 158 mg/dL — ABNORMAL HIGH (ref 70–99)
Potassium: 3.8 mmol/L (ref 3.5–5.1)
Sodium: 141 mmol/L (ref 135–145)
Total Bilirubin: 0.8 mg/dL (ref 0.3–1.2)
Total Protein: 6.4 g/dL — ABNORMAL LOW (ref 6.5–8.1)

## 2018-07-02 LAB — I-STAT TROPONIN, ED: Troponin i, poc: 0 ng/mL (ref 0.00–0.08)

## 2018-07-02 LAB — URINALYSIS, ROUTINE W REFLEX MICROSCOPIC
Bilirubin Urine: NEGATIVE
Glucose, UA: NEGATIVE mg/dL
Hgb urine dipstick: NEGATIVE
Ketones, ur: 20 mg/dL — AB
Leukocytes, UA: NEGATIVE
Nitrite: NEGATIVE
Protein, ur: NEGATIVE mg/dL
Specific Gravity, Urine: 1.021 (ref 1.005–1.030)
pH: 5 (ref 5.0–8.0)

## 2018-07-02 LAB — LIPASE, BLOOD: Lipase: 26 U/L (ref 11–51)

## 2018-07-02 MED ORDER — MECLIZINE HCL 25 MG PO TABS: 25.0000 mg | ORAL_TABLET | Freq: Three times a day (TID) | ORAL | 0 refills | Status: DC | PRN

## 2018-07-02 MED ORDER — MECLIZINE HCL 25 MG PO TABS
12.5000 mg | ORAL_TABLET | Freq: Once | ORAL | Status: AC
  Administered 2018-07-02: 12.5 mg via ORAL
  Filled 2018-07-02: qty 1

## 2018-07-02 NOTE — ED Notes (Signed)
Pt returned from MRI at this time

## 2018-07-02 NOTE — ED Provider Notes (Signed)
[facility]PART[facility] Provider Note   CSN: [id number] Arrival date & time: 07/02/18  6789     History   Chief Complaint Chief Complaint  Patient presents with  . Hypertension  . Emesis    HPI Alisha [name] is a 83 y.o. female.  83 year old female presents with worsening hypertension as well as new onset dizziness with ataxia which began yesterday.  Patient states that she has a hold onto the wall to walk.  Was seen at urgent care yesterday due to hypertensive urgency.  Was treated with clonidine and her antihypertensives were changed and she was picked up that medication today.  She has not done that.  Today she is here because of worsening mild headache without associated vomiting.  Patient thinks she has vertigo as her dizziness is worse if she moves her head one way to another.  Denies any tinnitus.  Denies any actual head trauma.  No anginal symptoms.  No treatment used for this prior to arrival     Past Medical History:  Diagnosis Date  . Arthritis   . Asthma   . Blood transfusion    over 40 y ears  . Breast mass, left   . Hypertension    on meds  . Hypothyroidism    on meds    Patient Active Problem List   Diagnosis Date Noted  . Arthritis of knee 02/20/2012    Past Surgical History:  Procedure Laterality Date  . ABDOMINAL HYSTERECTOMY     30 yrs  . ABDOMINAL SURGERY     at age 45  . BREAST LUMPECTOMY WITH RADIOACTIVE SEED LOCALIZATION Left 08/11/2016   Procedure: LEFT BREAST LUMPECTOMY WITH RADIOACTIVE SEED LOCALIZATION;  Surgeon: Erroll Luna, MD;  Location: [facility];  Service: General;  Laterality: Left;  . EYE SURGERY    . JOINT REPLACEMENT Bilateral    left knee 2012  . KNEE ARTHROPLASTY  02/17/2012   Procedure: COMPUTER ASSISTED TOTAL KNEE ARTHROPLASTY;  Surgeon: Meredith Pel, MD;  Location: [facility];  Service: Orthopedics;  Laterality: Right;  Right total knee replacement     OB History    None      Home Medications    Prior to Admission medications   Medication Sig Start Date End Date Taking? Authorizing Provider  albuterol (PROVENTIL HFA;VENTOLIN HFA) 108 (90 Base) MCG/ACT inhaler Inhale 1 puff into the lungs every 6 (six) hours as needed for wheezing or shortness of breath.    [provider]  BIOTIN PO Take by mouth.    [provider]  Calcium Carbonate-Vitamin D (CALTRATE COLON HEALTH PO) Take by mouth.    [provider]  dorzolamide (TRUSOPT) 2 % ophthalmic solution 1 drop 3 (three) times daily.    [provider]  furosemide (LASIX) 40 MG tablet Take 40 mg by mouth.    [provider]  hydrochlorothiazide (HYDRODIURIL) 25 MG tablet Take 25 mg by mouth daily.    [provider]  HYDROcodone-acetaminophen (NORCO) 5-325 MG tablet Take 1-2 tablets by mouth every 6 (six) hours as needed for moderate pain. 08/11/16   Cornett, Marcello Moores, MD  levothyroxine (SYNTHROID, LEVOTHROID) 88 MCG tablet Take 100 mcg by mouth daily.     [provider]  Naphazoline-Pheniramine (VISINE-A OP) Apply to eye.    [provider]  naproxen sodium (ALEVE) 220 MG tablet Take 220 mg by mouth.    [provider]  potassium chloride SA (K-DUR,KLOR-CON) 20 MEQ tablet  Take 20 mEq by mouth 2 (two) times daily.    [provider]  spironolactone (ALDACTONE) 50 MG tablet Take 50 mg by mouth daily.    [provider]  tizanidine (ZANAFLEX) 2 MG capsule Take 2 mg by mouth 3 (three) times daily.    [provider]    Family History Family History  Problem Relation Age of Onset  . Anesthesia problems Neg Hx   . Hypotension Neg Hx   . Malignant hyperthermia Neg Hx   . Pseudochol deficiency Neg Hx     Social History Social History   Tobacco Use  . Smoking status: Never Smoker  . Smokeless tobacco: Never Used  Substance Use Topics  . Alcohol use: No  . Drug use: No     Allergies    Lisinopril; Penicillins; and Sulfa antibiotics   Review of Systems Review of Systems  All other systems reviewed and are negative.    Physical Exam Updated Vital Signs BP (!) 192/76   Pulse (!) 53   Temp 98 F (36.7 C) (Oral)   Resp 14   SpO2 100%   Physical Exam  Constitutional: She is oriented to person, place, and time. She appears well-developed and well-nourished.  Non-toxic appearance. No distress.  HENT:  Head: Normocephalic and atraumatic.  Eyes: Pupils are equal, round, and reactive to light. Conjunctivae, EOM and lids are normal.  Neck: Normal range of motion. Neck supple. No tracheal deviation present. No thyroid mass present.  Cardiovascular: Normal rate, regular rhythm and normal heart sounds. Exam reveals no gallop.  No murmur heard. Pulmonary/Chest: Effort normal and breath sounds normal. No stridor. No respiratory distress. She has no decreased breath sounds. She has no wheezes. She has no rhonchi. She has no rales.  Abdominal: Soft. Normal appearance and bowel sounds are normal. She exhibits no distension. There is no tenderness. There is no rebound and no CVA tenderness.  Musculoskeletal: Normal range of motion. She exhibits no edema or tenderness.  Neurological: She is alert and oriented to person, place, and time. She has normal strength. She displays no tremor. No cranial nerve deficit or sensory deficit. She displays a negative Romberg sign. GCS eye subscore is 4. GCS verbal subscore is 5. GCS motor subscore is 6.  Horizontal nystagmus noted  Skin: Skin is warm and dry. No abrasion and no rash noted.  Psychiatric: She has a normal mood and affect. Her speech is normal and behavior is normal.  Nursing note and vitals reviewed.    ED Treatments / Results  Labs (all labs ordered are listed, but only abnormal results are displayed) Labs Reviewed  COMPREHENSIVE METABOLIC PANEL - Abnormal; Notable for the following components:      Result Value   Glucose,  Bld 158 (*)    Total Protein 6.4 (*)    All other components within normal limits  CBC - Abnormal; Notable for the following components:   WBC 11.5 (*)    All other components within normal limits  LIPASE, BLOOD  URINALYSIS, ROUTINE W REFLEX MICROSCOPIC  I-STAT TROPONIN, ED    EKG EKG Interpretation  Date/Time:  Friday July 02 2018 10:03:43 EDT Ventricular Rate:  49 PR Interval:  200 QRS Duration: 86 QT Interval:  480 QTC Calculation: 433 R Axis:   61 Text Interpretation:  Sinus bradycardia Otherwise normal ECG Confirmed by Lacretia Leigh ([id number]) on 07/02/2018 12:53:27 PM   Radiology No results found.  Procedures Procedures (including critical care time)  Medications Ordered in  ED Medications - No data to display   Initial Impression / Assessment and Plan / ED Course  I have reviewed the triage vital signs and the nursing notes.  Pertinent labs & imaging results that were available during my care of the patient were reviewed by me and considered in my medical decision making (see chart for details).     Further questioning patient endorses symptoms of vertigo consistent with dizziness with room spinning that is worse with remaining still.  She says she was ataxic concern for central vertigo and MRI of the brain was performed which per radiology was negative.  Patient was ambulated here in the hospital and feels better after receiving Antivert.  Suspect peripheral vertigo and will discharge home.  She also will get her prescription filled for her new high blood pressure medication.  Blood pressure has improved here. Final Clinical Impressions(s) / ED Diagnoses   Final diagnoses:  None    ED Discharge Orders    None       Lacretia Leigh, MD 07/02/18 1747

## 2018-07-02 NOTE — ED Triage Notes (Signed)
Pt in stating she has been having hypertension issues for the last few weeks, recent dizziness, last night began vomiting, reports her head feels full but denies headache, feels very dizzy and weak

## 2018-07-02 NOTE — ED Notes (Signed)
Patient transported to MRI 

## 2018-07-06 ENCOUNTER — Ambulatory Visit
Admission: RE | Admit: 2018-07-06 | Discharge: 2018-07-06 | Disposition: A | Discharge status: Home / Self Care | Payer: Medicare HMO | Source: Ambulatory Visit | Attending: Family Medicine | Admitting: Family Medicine

## 2018-07-06 DIAGNOSIS — Z13820 Encounter for screening for osteoporosis: Secondary | ICD-10-CM | POA: Diagnosis not present

## 2018-07-06 DIAGNOSIS — E2839 Other primary ovarian failure: Secondary | ICD-10-CM

## 2018-07-06 DIAGNOSIS — Z780 Asymptomatic menopausal state: Secondary | ICD-10-CM | POA: Diagnosis not present

## 2018-07-18 ENCOUNTER — Encounter (HOSPITAL_COMMUNITY): Payer: Self-pay | Admitting: Urgent Care

## 2018-07-18 ENCOUNTER — Ambulatory Visit (HOSPITAL_COMMUNITY)
Admission: EM | Admit: 2018-07-18 | Discharge: 2018-07-18 | Disposition: A | Discharge status: Home / Self Care | Payer: Medicare HMO

## 2018-07-18 DIAGNOSIS — R030 Elevated blood-pressure reading, without diagnosis of hypertension: Secondary | ICD-10-CM

## 2018-07-18 DIAGNOSIS — R42 Dizziness and giddiness: Secondary | ICD-10-CM

## 2018-07-18 DIAGNOSIS — I10 Essential (primary) hypertension: Secondary | ICD-10-CM

## 2018-07-18 NOTE — ED Provider Notes (Signed)
MRN: [id number] DOB: 1935/02/01  Subjective:   Alisha [name] is a 83 y.o. female presenting for recurrent mild dizziness, persistently elevated blood pressure.  Patient has a cardiologist and has been to the ER recently multiple times for her blood pressure.  She is supposed to be taking diltiazem and atenolol but reports that she is only taken Tylenol.  States that she is unclear on what medication she is supposed to take for her blood pressure.  Denies confusion, headache, vision changes, chest pain, shortness of breath, heart racing, palpitations, belly pain, hematuria.  reports that she has never smoked. She has never used smokeless tobacco. She reports that she does not drink alcohol or use drugs.   No current facility-administered medications for this encounter.   Current Outpatient Medications:  .  atenolol (TENORMIN) 25 MG tablet, Take by mouth daily., Disp: , Rfl:  .  diltiazem (CARDIZEM) 120 MG tablet, Take 120 mg by mouth 4 (four) times daily., Disp: , Rfl:  .  albuterol (PROVENTIL HFA;VENTOLIN HFA) 108 (90 Base) MCG/ACT inhaler, Inhale 1 puff into the lungs every 6 (six) hours as needed for wheezing or shortness of breath., Disp: , Rfl:  .  cetaphil (CETAPHIL) cream, Apply 1 application topically daily., Disp: , Rfl:  .  HYDROcodone-acetaminophen (NORCO) 5-325 MG tablet, Take 1-2 tablets by mouth every 6 (six) hours as needed for moderate pain. (Patient not taking: Reported on 07/02/2018), Disp: 20 tablet, Rfl: 0 .  Hydrocortisone-Aloe Vera (CORTIZONE-10/ALOE) 1 % CREA, Apply 1 application topically daily. Apply to dry area on leg, Disp: , Rfl:  .  levothyroxine (SYNTHROID, LEVOTHROID) 88 MCG tablet, Take 88 mcg by mouth daily. , Disp: , Rfl:  .  meclizine (ANTIVERT) 25 MG tablet, Take 1 tablet (25 mg total) by mouth 3 (three) times daily as needed for dizziness., Disp: 30 tablet, Rfl: 0 .  Naphazoline-Pheniramine (VISINE-A OP), Place 1 drop into both eyes as needed. , Disp: , Rfl:  .   Nutritional Supplements (COLON FORMULA PO), Take 1 tablet by mouth daily., Disp: , Rfl:  .  OVER THE COUNTER MEDICATION, Place 1 drop into both eyes daily. For dry eyes, Disp: , Rfl:    Allergies  Allergen Reactions  . Lisinopril Itching    No energy  . Penicillins Itching and Rash    Has patient had a PCN reaction causing immediate rash, facial/tongue/throat swelling, SOB or lightheadedness with hypotension: No Has patient had a PCN reaction causing severe rash involving mucus membranes or skin necrosis: No Has patient had a PCN reaction that required hospitalization: No Has patient had a PCN reaction occurring within the last 10 years: No If all of the above answers are "NO", then may proceed with Cephalosporin use.  . Sulfa Antibiotics Itching and Rash    Past Medical History:  Diagnosis Date  . Arthritis   . Asthma   . Blood transfusion    over 40 y ears  . Breast mass, left   . Hypertension    on meds  . Hypothyroidism    on meds     Past Surgical History:  Procedure Laterality Date  . ABDOMINAL HYSTERECTOMY     30 yrs  . ABDOMINAL SURGERY     at age 4  . BREAST LUMPECTOMY WITH RADIOACTIVE SEED LOCALIZATION Left 08/11/2016   Procedure: LEFT BREAST LUMPECTOMY WITH RADIOACTIVE SEED LOCALIZATION;  Surgeon: Erroll Luna, MD;  Location: [facility];  Service: General;  Laterality: Left;  . EYE SURGERY    .  JOINT REPLACEMENT Bilateral    left knee 2012  . KNEE ARTHROPLASTY  02/17/2012   Procedure: COMPUTER ASSISTED TOTAL KNEE ARTHROPLASTY;  Surgeon: Meredith Pel, MD;  Location: [facility];  Service: Orthopedics;  Laterality: Right;  Right total knee replacement    Objective:   Vitals: BP (!) 193/77 (BP Location: Left Arm)   Pulse (!) 51   Temp 97.6 F (36.4 C) (Oral)   Resp (!) 22   SpO2 96%   BP Readings from Last 3 Encounters:  07/18/18 (!) 193/77  07/02/18 (!) 176/84  07/01/18 (!) 177/76    Physical Exam  Constitutional: She is oriented  to person, place, and time. She appears well-developed and well-nourished.  HENT:  Mouth/Throat: Oropharynx is clear and moist.  Eyes: Pupils are equal, round, and reactive to light. EOM are normal.  Cardiovascular: Normal rate, regular rhythm, normal heart sounds and intact distal pulses. Exam reveals no gallop and no friction rub.  No murmur heard. Pulmonary/Chest: Effort normal and breath sounds normal. No stridor. No respiratory distress. She has no wheezes. She has no rales.  Neurological: She is alert and oriented to person, place, and time. She displays normal reflexes. No cranial nerve deficit. Coordination normal.  Speech is intact.  Negative Romberg and pronator drift.  Psychiatric: She has a normal mood and affect.   Assessment and Plan :   Essential hypertension  Elevated blood pressure reading  Dizziness  Patient on importance of medical compliance for her hypertension.  She will start her diltiazem and maintain her atenolol.  She agreed to follow-up with her cardiologist tomorrow as she is supposed to have an office visit with him.  ER return to clinic precautions reviewed.     Jaynee Eagles, PA-C 07/18/18 1348

## 2018-07-18 NOTE — ED Triage Notes (Signed)
Pt states her blood pressure is high. Has had mutiple adjustments to her BP meds. Was seen in ER for same and given antivert with relief of her dizziness.

## 2018-07-26 DIAGNOSIS — R739 Hyperglycemia, unspecified: Secondary | ICD-10-CM | POA: Diagnosis not present

## 2018-07-26 DIAGNOSIS — I10 Essential (primary) hypertension: Secondary | ICD-10-CM | POA: Diagnosis not present

## 2018-07-26 DIAGNOSIS — R42 Dizziness and giddiness: Secondary | ICD-10-CM | POA: Diagnosis not present

## 2018-07-26 DIAGNOSIS — E039 Hypothyroidism, unspecified: Secondary | ICD-10-CM | POA: Diagnosis not present

## 2018-07-29 DIAGNOSIS — H35033 Hypertensive retinopathy, bilateral: Secondary | ICD-10-CM | POA: Diagnosis not present

## 2018-07-29 DIAGNOSIS — H5319 Other subjective visual disturbances: Secondary | ICD-10-CM | POA: Diagnosis not present

## 2018-07-29 DIAGNOSIS — H353132 Nonexudative age-related macular degeneration, bilateral, intermediate dry stage: Secondary | ICD-10-CM | POA: Diagnosis not present

## 2018-07-29 DIAGNOSIS — H402231 Chronic angle-closure glaucoma, bilateral, mild stage: Secondary | ICD-10-CM | POA: Diagnosis not present

## 2018-08-12 ENCOUNTER — Ambulatory Visit
Admission: RE | Admit: 2018-08-12 | Discharge: 2018-08-12 | Disposition: A | Discharge status: Home / Self Care | Payer: Medicare HMO | Source: Ambulatory Visit | Attending: Internal Medicine | Admitting: Internal Medicine

## 2018-08-12 ENCOUNTER — Other Ambulatory Visit: Payer: Self-pay | Admitting: Internal Medicine

## 2018-08-12 DIAGNOSIS — R0602 Shortness of breath: Secondary | ICD-10-CM

## 2018-08-12 DIAGNOSIS — R5383 Other fatigue: Secondary | ICD-10-CM

## 2018-08-12 DIAGNOSIS — L659 Nonscarring hair loss, unspecified: Secondary | ICD-10-CM | POA: Diagnosis not present

## 2018-08-12 DIAGNOSIS — J45909 Unspecified asthma, uncomplicated: Secondary | ICD-10-CM | POA: Diagnosis not present

## 2018-08-12 DIAGNOSIS — R0609 Other forms of dyspnea: Secondary | ICD-10-CM | POA: Diagnosis not present

## 2018-08-12 DIAGNOSIS — R609 Edema, unspecified: Secondary | ICD-10-CM | POA: Diagnosis not present

## 2018-08-12 DIAGNOSIS — E039 Hypothyroidism, unspecified: Secondary | ICD-10-CM | POA: Diagnosis not present

## 2018-08-12 DIAGNOSIS — R601 Generalized edema: Secondary | ICD-10-CM

## 2018-08-12 DIAGNOSIS — R635 Abnormal weight gain: Secondary | ICD-10-CM | POA: Diagnosis not present

## 2018-08-12 DIAGNOSIS — R252 Cramp and spasm: Secondary | ICD-10-CM | POA: Diagnosis not present

## 2018-08-12 DIAGNOSIS — Z8639 Personal history of other endocrine, nutritional and metabolic disease: Secondary | ICD-10-CM | POA: Diagnosis not present

## 2018-08-13 DIAGNOSIS — E039 Hypothyroidism, unspecified: Secondary | ICD-10-CM | POA: Diagnosis not present

## 2018-08-13 DIAGNOSIS — J189 Pneumonia, unspecified organism: Secondary | ICD-10-CM | POA: Diagnosis not present

## 2018-08-13 DIAGNOSIS — R635 Abnormal weight gain: Secondary | ICD-10-CM | POA: Diagnosis not present

## 2018-08-13 DIAGNOSIS — I10 Essential (primary) hypertension: Secondary | ICD-10-CM | POA: Diagnosis not present

## 2018-08-13 DIAGNOSIS — R5383 Other fatigue: Secondary | ICD-10-CM | POA: Diagnosis not present

## 2018-08-13 DIAGNOSIS — R0602 Shortness of breath: Secondary | ICD-10-CM | POA: Diagnosis not present

## 2018-08-13 DIAGNOSIS — Z8639 Personal history of other endocrine, nutritional and metabolic disease: Secondary | ICD-10-CM | POA: Diagnosis not present

## 2018-08-13 DIAGNOSIS — R609 Edema, unspecified: Secondary | ICD-10-CM | POA: Diagnosis not present

## 2018-09-06 ENCOUNTER — Other Ambulatory Visit: Payer: Self-pay | Admitting: Family Medicine

## 2018-09-06 ENCOUNTER — Ambulatory Visit
Admission: RE | Admit: 2018-09-06 | Discharge: 2018-09-06 | Disposition: A | Discharge status: Home / Self Care | Payer: Medicare HMO | Source: Ambulatory Visit | Attending: Family Medicine | Admitting: Family Medicine

## 2018-09-06 DIAGNOSIS — R7309 Other abnormal glucose: Secondary | ICD-10-CM | POA: Diagnosis not present

## 2018-09-06 DIAGNOSIS — R9389 Abnormal findings on diagnostic imaging of other specified body structures: Secondary | ICD-10-CM

## 2018-09-06 DIAGNOSIS — R05 Cough: Secondary | ICD-10-CM | POA: Diagnosis not present

## 2018-09-06 DIAGNOSIS — R5383 Other fatigue: Secondary | ICD-10-CM | POA: Diagnosis not present

## 2018-09-06 DIAGNOSIS — R609 Edema, unspecified: Secondary | ICD-10-CM | POA: Diagnosis not present

## 2018-09-06 DIAGNOSIS — I10 Essential (primary) hypertension: Secondary | ICD-10-CM | POA: Diagnosis not present

## 2018-09-06 DIAGNOSIS — R0602 Shortness of breath: Secondary | ICD-10-CM | POA: Diagnosis not present

## 2018-09-06 DIAGNOSIS — L299 Pruritus, unspecified: Secondary | ICD-10-CM | POA: Diagnosis not present

## 2018-09-06 DIAGNOSIS — Z79899 Other long term (current) drug therapy: Secondary | ICD-10-CM | POA: Diagnosis not present

## 2018-09-06 DIAGNOSIS — E039 Hypothyroidism, unspecified: Secondary | ICD-10-CM | POA: Diagnosis not present

## 2018-09-10 ENCOUNTER — Other Ambulatory Visit: Payer: Self-pay | Admitting: Family Medicine

## 2018-09-10 DIAGNOSIS — R9389 Abnormal findings on diagnostic imaging of other specified body structures: Secondary | ICD-10-CM

## 2018-09-13 ENCOUNTER — Ambulatory Visit
Admission: RE | Admit: 2018-09-13 | Discharge: 2018-09-13 | Disposition: A | Discharge status: Home / Self Care | Payer: Medicare HMO | Source: Ambulatory Visit | Attending: Family Medicine | Admitting: Family Medicine

## 2018-09-13 DIAGNOSIS — R9389 Abnormal findings on diagnostic imaging of other specified body structures: Secondary | ICD-10-CM

## 2018-09-13 DIAGNOSIS — J181 Lobar pneumonia, unspecified organism: Secondary | ICD-10-CM | POA: Diagnosis not present

## 2018-09-13 MED ORDER — IOPAMIDOL (ISOVUE-300) INJECTION 61%
75.0000 mL | Freq: Once | INTRAVENOUS | Status: AC | PRN
  Administered 2018-09-13: 75 mL via INTRAVENOUS

## 2018-10-08 DIAGNOSIS — E039 Hypothyroidism, unspecified: Secondary | ICD-10-CM | POA: Diagnosis not present

## 2018-10-08 DIAGNOSIS — R918 Other nonspecific abnormal finding of lung field: Secondary | ICD-10-CM | POA: Diagnosis not present

## 2018-10-08 DIAGNOSIS — L299 Pruritus, unspecified: Secondary | ICD-10-CM | POA: Diagnosis not present

## 2018-10-08 DIAGNOSIS — I10 Essential (primary) hypertension: Secondary | ICD-10-CM | POA: Diagnosis not present

## 2018-10-08 DIAGNOSIS — Z23 Encounter for immunization: Secondary | ICD-10-CM | POA: Diagnosis not present

## 2018-10-08 DIAGNOSIS — R609 Edema, unspecified: Secondary | ICD-10-CM | POA: Diagnosis not present

## 2018-10-08 DIAGNOSIS — I700 Atherosclerosis of aorta: Secondary | ICD-10-CM | POA: Diagnosis not present

## 2018-10-08 DIAGNOSIS — R7303 Prediabetes: Secondary | ICD-10-CM | POA: Diagnosis not present

## 2018-10-20 DIAGNOSIS — U071 COVID-19: Secondary | ICD-10-CM

## 2018-10-20 HISTORY — DX: COVID-19: U07.1

## 2018-11-04 DIAGNOSIS — I10 Essential (primary) hypertension: Secondary | ICD-10-CM | POA: Diagnosis not present

## 2018-11-15 DIAGNOSIS — I10 Essential (primary) hypertension: Secondary | ICD-10-CM | POA: Diagnosis not present

## 2018-11-17 DIAGNOSIS — R0602 Shortness of breath: Secondary | ICD-10-CM | POA: Diagnosis not present

## 2018-11-17 DIAGNOSIS — L659 Nonscarring hair loss, unspecified: Secondary | ICD-10-CM | POA: Diagnosis not present

## 2018-11-17 DIAGNOSIS — R5383 Other fatigue: Secondary | ICD-10-CM | POA: Diagnosis not present

## 2018-11-17 DIAGNOSIS — E039 Hypothyroidism, unspecified: Secondary | ICD-10-CM | POA: Diagnosis not present

## 2018-12-21 DIAGNOSIS — I10 Essential (primary) hypertension: Secondary | ICD-10-CM | POA: Diagnosis not present

## 2019-01-03 ENCOUNTER — Other Ambulatory Visit: Payer: Self-pay | Admitting: Family Medicine

## 2019-01-03 DIAGNOSIS — R918 Other nonspecific abnormal finding of lung field: Secondary | ICD-10-CM

## 2019-01-07 ENCOUNTER — Other Ambulatory Visit: Payer: Self-pay | Admitting: Family Medicine

## 2019-01-07 DIAGNOSIS — R918 Other nonspecific abnormal finding of lung field: Secondary | ICD-10-CM

## 2019-01-17 ENCOUNTER — Other Ambulatory Visit: Payer: Medicare HMO

## 2019-02-22 DIAGNOSIS — M79602 Pain in left arm: Secondary | ICD-10-CM | POA: Diagnosis not present

## 2019-03-08 ENCOUNTER — Other Ambulatory Visit: Payer: Medicare HMO

## 2019-03-08 ENCOUNTER — Ambulatory Visit
Admission: RE | Admit: 2019-03-08 | Discharge: 2019-03-08 | Disposition: A | Discharge status: Home / Self Care | Payer: Medicare HMO | Source: Ambulatory Visit | Attending: Family Medicine | Admitting: Family Medicine

## 2019-03-08 DIAGNOSIS — R918 Other nonspecific abnormal finding of lung field: Secondary | ICD-10-CM | POA: Diagnosis not present

## 2019-03-15 DIAGNOSIS — I10 Essential (primary) hypertension: Secondary | ICD-10-CM | POA: Diagnosis not present

## 2019-03-15 DIAGNOSIS — R0602 Shortness of breath: Secondary | ICD-10-CM | POA: Diagnosis not present

## 2019-03-15 DIAGNOSIS — E039 Hypothyroidism, unspecified: Secondary | ICD-10-CM | POA: Diagnosis not present

## 2019-03-15 DIAGNOSIS — R739 Hyperglycemia, unspecified: Secondary | ICD-10-CM | POA: Diagnosis not present

## 2019-03-15 DIAGNOSIS — R918 Other nonspecific abnormal finding of lung field: Secondary | ICD-10-CM | POA: Diagnosis not present

## 2019-03-15 DIAGNOSIS — R5383 Other fatigue: Secondary | ICD-10-CM | POA: Diagnosis not present

## 2019-03-16 DIAGNOSIS — E039 Hypothyroidism, unspecified: Secondary | ICD-10-CM | POA: Diagnosis not present

## 2019-03-16 DIAGNOSIS — R739 Hyperglycemia, unspecified: Secondary | ICD-10-CM | POA: Diagnosis not present

## 2019-03-16 DIAGNOSIS — R5383 Other fatigue: Secondary | ICD-10-CM | POA: Diagnosis not present

## 2019-03-16 DIAGNOSIS — R0602 Shortness of breath: Secondary | ICD-10-CM | POA: Diagnosis not present

## 2019-03-22 DIAGNOSIS — Z0000 Encounter for general adult medical examination without abnormal findings: Secondary | ICD-10-CM | POA: Diagnosis not present

## 2019-03-22 DIAGNOSIS — R0602 Shortness of breath: Secondary | ICD-10-CM | POA: Diagnosis not present

## 2019-03-22 DIAGNOSIS — E039 Hypothyroidism, unspecified: Secondary | ICD-10-CM | POA: Diagnosis not present

## 2019-03-22 DIAGNOSIS — I10 Essential (primary) hypertension: Secondary | ICD-10-CM | POA: Diagnosis not present

## 2019-03-22 DIAGNOSIS — R739 Hyperglycemia, unspecified: Secondary | ICD-10-CM | POA: Diagnosis not present

## 2019-03-22 DIAGNOSIS — M79603 Pain in arm, unspecified: Secondary | ICD-10-CM | POA: Diagnosis not present

## 2019-03-24 DIAGNOSIS — M67912 Unspecified disorder of synovium and tendon, left shoulder: Secondary | ICD-10-CM | POA: Diagnosis not present

## 2019-03-28 DIAGNOSIS — M6281 Muscle weakness (generalized): Secondary | ICD-10-CM | POA: Diagnosis not present

## 2019-03-28 DIAGNOSIS — M7542 Impingement syndrome of left shoulder: Secondary | ICD-10-CM | POA: Diagnosis not present

## 2019-03-31 DIAGNOSIS — M6281 Muscle weakness (generalized): Secondary | ICD-10-CM | POA: Diagnosis not present

## 2019-03-31 DIAGNOSIS — M7542 Impingement syndrome of left shoulder: Secondary | ICD-10-CM | POA: Diagnosis not present

## 2019-04-04 DIAGNOSIS — M6281 Muscle weakness (generalized): Secondary | ICD-10-CM | POA: Diagnosis not present

## 2019-04-04 DIAGNOSIS — M7542 Impingement syndrome of left shoulder: Secondary | ICD-10-CM | POA: Diagnosis not present

## 2019-04-11 DIAGNOSIS — M6281 Muscle weakness (generalized): Secondary | ICD-10-CM | POA: Diagnosis not present

## 2019-04-11 DIAGNOSIS — M7542 Impingement syndrome of left shoulder: Secondary | ICD-10-CM | POA: Diagnosis not present

## 2019-04-14 DIAGNOSIS — M7542 Impingement syndrome of left shoulder: Secondary | ICD-10-CM | POA: Diagnosis not present

## 2019-04-14 DIAGNOSIS — M6281 Muscle weakness (generalized): Secondary | ICD-10-CM | POA: Diagnosis not present

## 2019-04-18 DIAGNOSIS — M7542 Impingement syndrome of left shoulder: Secondary | ICD-10-CM | POA: Diagnosis not present

## 2019-04-18 DIAGNOSIS — M6281 Muscle weakness (generalized): Secondary | ICD-10-CM | POA: Diagnosis not present

## 2019-04-20 HISTORY — PX: OTHER SURGICAL HISTORY: SHX169

## 2019-04-21 ENCOUNTER — Other Ambulatory Visit: Payer: Self-pay

## 2019-04-21 ENCOUNTER — Ambulatory Visit (INDEPENDENT_AMBULATORY_CARE_PROVIDER_SITE_OTHER): Payer: Medicare HMO | Admitting: Cardiology

## 2019-04-21 ENCOUNTER — Encounter: Payer: Self-pay | Admitting: Cardiology

## 2019-04-21 VITALS — BP 144/74 | HR 48 | Temp 97.3°F | Ht 64.5 in | Wt 228.0 lb

## 2019-04-21 DIAGNOSIS — Z01818 Encounter for other preprocedural examination: Secondary | ICD-10-CM

## 2019-04-21 DIAGNOSIS — R5383 Other fatigue: Secondary | ICD-10-CM

## 2019-04-21 DIAGNOSIS — R001 Bradycardia, unspecified: Secondary | ICD-10-CM | POA: Diagnosis not present

## 2019-04-21 DIAGNOSIS — R0600 Dyspnea, unspecified: Secondary | ICD-10-CM

## 2019-04-21 DIAGNOSIS — R0609 Other forms of dyspnea: Secondary | ICD-10-CM

## 2019-04-21 MED ORDER — METOPROLOL TARTRATE 50 MG PO TABS: 50.0000 mg | ORAL_TABLET | Freq: Once | ORAL | 0 refills | Status: DC

## 2019-04-21 NOTE — Patient Instructions (Addendum)
Medication Instructions:     If you need a refill on your cardiac medications before your next appointment, please call your pharmacy.   Lab work:  If you have labs (blood work) drawn today and your tests are completely normal, you will receive your results only by: Marland Kitchen MyChart Message (if you have MyChart) OR . A paper copy in the mail If you have any lab test that is abnormal or we need to change your treatment, we will call you to review the results.  Testing/Procedures: WILL BE SCHEDULE AT [place] [address] FOR THE FIRST WEEK OF WEARING THE MONITOR  TAKE DILTIAZEM  AND THE SECOND WEEK DO NOT TAKE DILTIAZEM  - TRY TO WALK /EXERCISE WHILE WEARING MONITOR Your physician has recommended that you wear an event monitor 14 DAY ZIO MONITOR. Event monitors are medical devices that record the heart's electrical activity. Doctors most often [place] these monitors to diagnose arrhythmias. Arrhythmias are problems with the speed or rhythm of the heartbeat. The monitor is a small, portable device. You can wear one while you do your normal daily activities. This is usually used to diagnose what is causing palpitations/syncope (passing out). AND WILL SCHEDULE  THIS TEST  AFTER  PRIOR AUTHORIZATION FROM INSURANCE AT [facility]. PLEASE FOLLOW INSTRUCTION BELOW Your physician has requested that you have cardiac CTA. Cardiac computed tomography (CT) is a painless test that uses an x-ray machine to take clear, detailed pictures of your heart. For further information please visit HugeFiesta.tn. Please follow instruction sheet as given.     Follow-Up: At [facility], you and your health needs are our priority.  As part of our continuing mission to provide you with exceptional heart care, we have created designated Provider Care Teams.  These Care Teams include your primary Cardiologist (physician) and Advanced Practice Providers (APPs -  Physician Assistants and Nurse Practitioners) who  all work together to provide you with the care you need, when you need it. . You will need a follow up appointment in 2 TO 3  Months  SEPT /OCT2020.  Please call our office 2 months in advance to schedule this appointment.  You may see Glenetta Hew, MD or one of the following Advanced Practice Providers on your designated Care Team:   . Rosaria Ferries, PA-C . Jory Sims, DNP, ANP  Any Other Special Instructions Will Be Listed Below (If Applicable).    INSTRUCTIONS FOR  CORONARY CTA    Please arrive at the [facility] main entrance of [facility] at (30-45 minutes prior to test start time)  [facility] [place], [place] [address] [phone]  Proceed to the [facility] Radiology Department (First Floor).  Please follow these instructions carefully (unless otherwise directed):  PLEASE HAVE LABS - BMP  AT LEAST ONE WEEK PRIOR TO TEST  On the Night Before the Test: . Drink plenty of water. . Do not consume any caffeinated/decaffeinated beverages or chocolate 12 hours prior to your test. . Do not take any antihistamines 12 hours prior to your test.    On the Day of the Test: . Drink plenty of water. Do not drink any water within one hour of the test. . Do not eat any food 4 hours prior to the test. . You may take your regular medications prior to the test. . I Take 50 mg of lopressor (metoprolol) TWO hour before the test.   After the Test: . Drink plenty of water. Marland Kitchen  After receiving IV contrast, you may experience a mild flushed feeling. This is normal. . On occasion, you may experience a mild rash up to 24 hours after the test. This is not dangerous. If this occurs, you can take Benadryl 25 mg and increase your fluid intake. . If you experience trouble breathing, this can be serious. If it is severe call 911 IMMEDIATELY. If it is mild, please call our office.

## 2019-04-21 NOTE — Progress Notes (Signed)
PCP: Alisha Pepper, MD  Clinic Note: Chief Complaint  Patient presents with   Follow-up   Headache   Shortness of Breath   Fatigue    Very.   Edema    HPI: Alisha [name] is a 84 y.o. female who is being seen today for the evaluation of Prolonged Fatigue (& possible Pre-op Evaluation) at the request of Alisha Pepper, MD.  Alisha [name] was last seen by PCP 12/21/2018  Recent Hospitalizations: n/a  Studies Personally Reviewed - (if available, images/films reviewed: From Epic Chart or Care Everywhere)  n/a  Interval History: Alisha [name] presents here today for evaluation of basically just fatigue.  Lack of stamina mostly.  It is not as much of a lack of desire, just lack of stamina.  What she notes is over the last 3 years she is gained from 180 pounds up to 220 pounds because she just stopped doing things because of shortness of breath and and lack of energy.  She previously been evaluated by Dr. Wynonia [name] and has been followed annually, but now is establishing cardiology care here.  She does note that she probably is not sleeping that well, and occasionally takes Zyquil  at night to help her sleep.  Otherwise she does not feel that she has any significant snoring issues.  No PND or orthopnea. She just feels weak and tired when she starts doing things, but does not necessarily have any chest pain or pressure.  Maybe some exertional dyspnea, especially going upstairs. She has spells where she feels like she just may be falling or dizzy but has not had any syncope or near syncope.  No TIA or amaurosis fugax.  She does note off-and-on swelling which is usually worse in the morning and better during the day.  ROS: A comprehensive was performed. Review of Systems  Constitutional: Positive for malaise/fatigue. Negative for weight loss (gain).  HENT: Negative for congestion and nosebleeds.   Respiratory: Positive for shortness of breath (per HPI).   Cardiovascular: Positive  for leg swelling (per HPI). Negative for claudication.  Gastrointestinal: Negative for abdominal pain, blood in stool, constipation, heartburn, melena and nausea.  Genitourinary: Negative for dysuria and hematuria.  Musculoskeletal: Positive for back pain and joint pain. Negative for falls.  Skin: Negative.   Neurological: Positive for dizziness (per HPI). Negative for tingling and headaches.  Psychiatric/Behavioral: Negative.   All other systems reviewed and are negative.   The patient does not have symptoms concerning for COVID-19 infection (fever, chills, cough, or new shortness of breath).  The patient is practicing social distancing.   COVID-19 Education: The signs and symptoms of COVID-19 were discussed with the patient and how to seek care for testing (follow up with PCP or arrange E-visit).   The importance of social distancing was discussed today.   I have reviewed and (if needed) personally updated the patient's problem list, medications, allergies, past medical and surgical history, social and family history.   Past Medical History:  Diagnosis Date   Arthritis    Asthma    Never smoker   Blood transfusion    over 41 y ears   Breast mass, left    Hypertension    on meds   Hypothyroidism    on meds    Past Surgical History:  Procedure Laterality Date   ABDOMINAL HYSTERECTOMY     32 yrs   BREAST LUMPECTOMY WITH RADIOACTIVE SEED LOCALIZATION Left 08/11/2016   Procedure: LEFT BREAST LUMPECTOMY WITH RADIOACTIVE  SEED LOCALIZATION;  Surgeon: Erroll Luna, MD;  Location: [place];  Service: General;  Laterality: Left;   CATARACT EXTRACTION, BILATERAL     JOINT REPLACEMENT Bilateral    left knee 2012   KNEE ARTHROPLASTY  02/17/2012   Procedure: COMPUTER ASSISTED TOTAL KNEE ARTHROPLASTY;  Surgeon: Meredith Pel, MD;  Location: [place];  Service: Orthopedics;  Laterality: Right;  Right total knee replacement    Current Meds  Medication  Sig   acetaminophen (TYLENOL) 500 MG tablet Take 500 mg by mouth every 6 (six) hours as needed.   albuterol (PROVENTIL HFA;VENTOLIN HFA) 108 (90 Base) MCG/ACT inhaler Inhale 1 puff into the lungs every 6 (six) hours as needed for wheezing or shortness of breath.   Biotin 10000 MCG TABS Take 1 tablet by mouth daily.   cetaphil (CETAPHIL) cream Apply 1 application topically daily.   Cholecalciferol (VITAMIN D3) 50 MCG (2000 UT) TABS Take 2,000 Units by mouth daily.   diltiazem (CARDIZEM) 60 MG tablet Take 30 mg by mouth 4 (four) times daily.   diphenhydrAMINE HCl, Sleep, (ZZZQUIL PO) Take 1 tablet by mouth daily.   levothyroxine (SYNTHROID, LEVOTHROID) 88 MCG tablet Take 88 mcg by mouth daily.    Multiple Vitamins-Minerals (ICAPS AREDS 2 PO) Take 1 capsule by mouth daily.   Nutritional Supplements (COLON FORMULA PO) Take 1 tablet by mouth daily.   OVER THE COUNTER MEDICATION Place 1 drop into both eyes daily. For dry eyes   spironolactone (ALDACTONE) 50 MG tablet Take 50 mg by mouth daily.   tiZANidine (ZANAFLEX) 2 MG tablet Take 2 mg by mouth as needed for muscle spasms.   [DISCONTINUED] atenolol (TENORMIN) 25 MG tablet Take by mouth daily.   [DISCONTINUED] diltiazem (CARDIZEM) 120 MG tablet Take 120 mg by mouth 4 (four) times daily.   [DISCONTINUED] HYDROcodone-acetaminophen (NORCO) 5-325 MG tablet Take 1-2 tablets by mouth every 6 (six) hours as needed for moderate pain.   [DISCONTINUED] Hydrocortisone-Aloe Vera (CORTIZONE-10/ALOE) 1 % CREA Apply 1 application topically daily. Apply to dry area on leg   [DISCONTINUED] meclizine (ANTIVERT) 25 MG tablet Take 1 tablet (25 mg total) by mouth 3 (three) times daily as needed for dizziness.   [DISCONTINUED] Naphazoline-Pheniramine (VISINE-A OP) Place 1 drop into both eyes as needed.     Allergies  Allergen Reactions   Lisinopril Itching    No energy   Penicillins Itching and Rash    Has patient had a PCN reaction causing  immediate rash, facial/tongue/throat swelling, SOB or lightheadedness with hypotension: No Has patient had a PCN reaction causing severe rash involving mucus membranes or skin necrosis: No Has patient had a PCN reaction that required hospitalization: No Has patient had a PCN reaction occurring within the last 10 years: No If all of the above answers are "NO", then may proceed with Cephalosporin use.   Sulfa Antibiotics Itching and Rash    Social History   Tobacco Use   Smoking status: Never Smoker   Smokeless tobacco: Never Used  Substance Use Topics   Alcohol use: No   Drug use: No   Social History   Social History Narrative   No longer exercising - since "got tired" - previously did elliptical, exercise bike etc.    While working - used worked gym    family history includes Diabetes in her brother and sister; Hypertension in her sister; Other in her mother.  Wt Readings from Last 3 Encounters:  04/21/19 228 lb (103.4 kg)  08/11/16  221 lb (100.2 kg)  02/17/12 199 lb 1.2 oz (90.3 kg)    PHYSICAL EXAM BP (!) 144/74 (BP Location: Left Arm, Patient Position: Sitting, Cuff Size: Large)    Pulse (!) 48    Temp (!) 97.3 F (36.3 C)    Ht 5' 4.5" (1.638 m)    Wt 228 lb (103.4 kg)    BMI 38.53 kg/m  Physical Exam  Constitutional: She is oriented to person, place, and time.  HENT:  Head: Normocephalic and atraumatic.  Neck: Normal range of motion. Neck supple. No JVD present. Carotid bruit is not present.  Cardiovascular: Regular rhythm, S1 normal and S2 normal.  Occasional extrasystoles are present. Bradycardia present. PMI is not displaced. Exam reveals distant heart sounds. Exam reveals no gallop and no friction rub. Decreased pulses: decreased due to swelling & adipose tissue.  No murmur heard. Pulmonary/Chest: Effort normal and breath sounds normal. No respiratory distress. She has no wheezes. She has no rales.  Abdominal: Soft. Bowel sounds are normal. She exhibits no  distension. There is no abdominal tenderness. There is no rebound.  Obese  Musculoskeletal: Normal range of motion.        General: Edema (trivial) present.  Neurological: She is alert and oriented to person, place, and time. No cranial nerve deficit.  Psychiatric: She has a normal mood and affect. Her behavior is normal. Judgment and thought content normal.  Vitals reviewed.    Adult ECG Report  Rate: 48 ;  Rhythm: sinus bradycardia, sinus arrhythmia and Otherwise normal axis, intervals and durations.;   Narrative Interpretation: Relatively normal EKG with sinus bradycardia.   Other studies Reviewed: Additional studies/ records that were reviewed today include:  Recent Labs:  Not available  ASSESSMENT / PLAN:  Alisha [name] is being evaluated for significant fatigue and some exertional dyspnea.  She is noted to have bradycardia and borderline elevated blood pressures. She had previously been on atenolol and diltiazem both of which been stopped and she is simply taking very low-dose of diltiazem.  She has also previously been on spironolactone for swelling -she is not sure if she is still taking that or not.  Certainly with her bradycardia, would not use AV nodal agents such as beta blockers or non-dihydropyridine calcium channel blockers. I would like to get her off diltiazem and probably switch to amlodipine for blood pressure control plus/minus ARB.  Plan: 14-day monitor to evaluate baseline heart rate and chronotropic competence. Given her risk factors of obesity and hypertension with exertional dyspnea we will also evaluate for possible coronary artery disease with a coronary CT angiogram (considering exertional dyspnea to be a possible anginal equivalent).  Given that she does not necessarily have any heart failure symptoms, I think we are okay holding off on an echocardiogram.  She may be needing to have a surgery (possibly cataract surgery) in the next couple months.  Based  on our results from the CT angiogram, we would be L to provide better assessment of her cardiovascular risk.   We will see her in follow-up after her testing is complete.  Problem List Items Addressed This Visit    Fatigue   Relevant Orders   EKG 12-Lead   LONG TERM MONITOR (3-14 DAYS)   CT CORONARY MORPH W/CTA COR W/SCORE W/CA W/CM &/OR WO/CM   CT CORONARY FRACTIONAL FLOW RESERVE DATA PREP   CT CORONARY FRACTIONAL FLOW RESERVE FLUID ANALYSIS   Basic metabolic panel   DOE (dyspnea on exertion)   Relevant Orders  LONG TERM MONITOR (3-14 DAYS)   CT CORONARY MORPH W/CTA COR W/SCORE W/CA W/CM &/OR WO/CM   CT CORONARY FRACTIONAL FLOW RESERVE DATA PREP   CT CORONARY FRACTIONAL FLOW RESERVE FLUID ANALYSIS   Basic metabolic panel   Bradycardia with 41-50 beats per minute   Relevant Orders   EKG 12-Lead   LONG TERM MONITOR (3-14 DAYS)   CT CORONARY MORPH W/CTA COR W/SCORE W/CA W/CM &/OR WO/CM   CT CORONARY FRACTIONAL FLOW RESERVE DATA PREP   CT CORONARY FRACTIONAL FLOW RESERVE FLUID ANALYSIS    Other Visit Diagnoses    Pre-op testing    -  Primary   Relevant Orders   Basic metabolic panel      I spent a total of 25 minutes with the patient and chart review. >  50% of the time was spent in direct patient consultation.   Current medicines are reviewed at length with the patient today.  (+/- concerns) n/a The following changes have been made:  see below   Patient Instructions  Medication Instructions:     If you need a refill on your cardiac medications before your next appointment, please call your pharmacy.   Lab work:  If you have labs (blood work) drawn today and your tests are completely normal, you will receive your results only by:  [place] (if you have MyChart) OR  A paper copy in the mail If you have any lab test that is abnormal or we need to change your treatment, we will call you to review the results.  Testing/Procedures: WILL BE SCHEDULE AT [facility] FOR THE FIRST WEEK OF WEARING THE MONITOR  TAKE DILTIAZEM  AND THE SECOND WEEK DO NOT TAKE DILTIAZEM  - TRY TO WALK /EXERCISE WHILE WEARING MONITOR Your physician has recommended that you wear an event monitor 14 DAY ZIO MONITOR. Event monitors are medical devices that record the hearts electrical activity. Doctors most often [place] these monitors to diagnose arrhythmias. Arrhythmias are problems with the speed or rhythm of the heartbeat. The monitor is a small, portable device. You can wear one while you do your normal daily activities. This is usually used to diagnose what is causing palpitations/syncope (passing out). AND WILL SCHEDULE  THIS TEST  AFTER  PRIOR AUTHORIZATION FROM INSURANCE AT [facility]. PLEASE FOLLOW INSTRUCTION BELOW Your physician has requested that you have cardiac CTA. Cardiac computed tomography (CT) is a painless test that uses an x-ray machine to take clear, detailed pictures of your heart. For further information please visit [email or web address].[email or web address]. Please follow instruction sheet as given.     Follow-Up: At [facility], you and your health needs are our priority.  As part of our continuing mission to provide you with exceptional heart care, we have created designated Provider Care Teams.  These Care Teams include your primary Cardiologist (physician) and Advanced Practice Providers (APPs -  Physician Assistants and Nurse Practitioners) who all work together to provide you with the care you need, when you need it.  You will need a follow up appointment in 2 TO 3  Months  SEPT /OCT2020.  Please call our office 2 months in advance to schedule this appointment.  You may see Glenetta Hew, MD or one of the following Advanced Practice Providers on your designated Care Team:    Rosaria Ferries, PA-C  Jory Sims, DNP, ANP  Any Other Special Instructions Will Be Listed Below (If Applicable).    INSTRUCTIONS FOR  CORONARY CTA  Please  arrive at the [facility] main entrance of [facility] at (30-45 minutes prior to test start time)  [facility] [address] [place], [place] [address] [phone]  Proceed to the [facility] Radiology Department (First Floor).  Please follow these instructions carefully (unless otherwise directed):  PLEASE HAVE LABS - BMP  AT LEAST ONE WEEK PRIOR TO TEST  On the Night Before the Test:  Drink plenty of water.  Do not consume any caffeinated/decaffeinated beverages or chocolate 12 hours prior to your test.  Do not take any antihistamines 12 hours prior to your test.    On the Day of the Test:  Drink plenty of water. Do not drink any water within one hour of the test.  Do not eat any food 4 hours prior to the test.  You may take your regular medications prior to the test.  I Take 50 mg of lopressor (metoprolol) TWO hour before the test.   After the Test:  Drink plenty of water.  After receiving IV contrast, you may experience a mild flushed feeling. This is normal.  On occasion, you may experience a mild rash up to 24 hours after the test. This is not dangerous. If this occurs, you can take Benadryl 25 mg and increase your fluid intake.  If you experience trouble breathing, this can be serious. If it is severe call 911 IMMEDIATELY. If it is mild, please call our office.           Studies Ordered:   Orders Placed This Encounter  Procedures   CT CORONARY MORPH W/CTA COR W/SCORE W/CA W/CM &/OR WO/CM   CT CORONARY FRACTIONAL FLOW RESERVE DATA PREP   CT CORONARY FRACTIONAL FLOW RESERVE FLUID ANALYSIS   Basic metabolic panel   LONG TERM MONITOR (3-14 DAYS)   EKG 12-Lead      Glenetta Hew, M.D., M.S. Interventional Cardiologist   Pager # [phone] Phone # [phone] [address]. [place],  [address]   Thank you for choosing Heartcare at [facility]!!

## 2019-04-23 ENCOUNTER — Encounter: Payer: Self-pay | Admitting: Cardiology

## 2019-04-25 ENCOUNTER — Telehealth: Payer: Self-pay | Admitting: *Deleted

## 2019-04-25 NOTE — Telephone Encounter (Signed)
14 day ZIO XT long term holter monitor to be mailed to patients home.  Instructions reviewed briefly as they are included in the monitor kit.

## 2019-04-26 DIAGNOSIS — M6281 Muscle weakness (generalized): Secondary | ICD-10-CM | POA: Diagnosis not present

## 2019-04-26 DIAGNOSIS — M7542 Impingement syndrome of left shoulder: Secondary | ICD-10-CM | POA: Diagnosis not present

## 2019-04-28 DIAGNOSIS — H402231 Chronic angle-closure glaucoma, bilateral, mild stage: Secondary | ICD-10-CM | POA: Diagnosis not present

## 2019-04-28 DIAGNOSIS — H04123 Dry eye syndrome of bilateral lacrimal glands: Secondary | ICD-10-CM | POA: Diagnosis not present

## 2019-04-28 DIAGNOSIS — H524 Presbyopia: Secondary | ICD-10-CM | POA: Diagnosis not present

## 2019-04-29 DIAGNOSIS — M7542 Impingement syndrome of left shoulder: Secondary | ICD-10-CM | POA: Diagnosis not present

## 2019-04-29 DIAGNOSIS — M6281 Muscle weakness (generalized): Secondary | ICD-10-CM | POA: Diagnosis not present

## 2019-05-02 ENCOUNTER — Telehealth: Payer: Self-pay | Admitting: *Deleted

## 2019-05-02 NOTE — Telephone Encounter (Signed)
Patient having difficulty applying monitor.  Scheduled to come into office 05/04/2019 at 2:30 PM for assistance.

## 2019-05-02 NOTE — Telephone Encounter (Signed)
Follow up    Patient is returning call in reference to monitor.

## 2019-05-02 NOTE — Telephone Encounter (Signed)
    COVID-19 Pre-Screening Questions:  . In the past 7 to 10 days have you had a cough,  shortness of breath, headache, congestion, fever (100 or greater) body aches, chills, sore throat, or sudden loss of taste or sense of smell? . Have you been around anyone with known Covid 19. . Have you been around anyone who is awaiting Covid 19 test results in the past 7 to 10 days? . Have you been around anyone who has been exposed to Covid 19, or has mentioned symptoms of Covid 19 within the past 7 to 10 days?  If you have any concerns/questions about symptoms patients report during screening (either on the phone or at [facility]). Contact the provider seeing the patient or DOD for further guidance.  If neither are available contact a member of the leadership team.  Patient answered no to the above questions.  She has been informed not to arrive sooner than 15 minutes prior to her appointment. She will be coming alone to our office.  She is aware we require all patients and staff to wear face masks.

## 2019-05-03 DIAGNOSIS — M67912 Unspecified disorder of synovium and tendon, left shoulder: Secondary | ICD-10-CM | POA: Diagnosis not present

## 2019-05-04 ENCOUNTER — Other Ambulatory Visit: Payer: Self-pay | Admitting: Cardiology

## 2019-05-04 ENCOUNTER — Other Ambulatory Visit: Payer: Self-pay

## 2019-05-04 ENCOUNTER — Ambulatory Visit (INDEPENDENT_AMBULATORY_CARE_PROVIDER_SITE_OTHER): Payer: Medicare HMO

## 2019-05-04 DIAGNOSIS — R5383 Other fatigue: Secondary | ICD-10-CM | POA: Diagnosis not present

## 2019-05-04 DIAGNOSIS — R0600 Dyspnea, unspecified: Secondary | ICD-10-CM

## 2019-05-04 DIAGNOSIS — R001 Bradycardia, unspecified: Secondary | ICD-10-CM

## 2019-05-04 DIAGNOSIS — R0609 Other forms of dyspnea: Secondary | ICD-10-CM

## 2019-05-10 ENCOUNTER — Telehealth (HOSPITAL_COMMUNITY): Payer: Self-pay | Admitting: Emergency Medicine

## 2019-05-10 DIAGNOSIS — R0609 Other forms of dyspnea: Secondary | ICD-10-CM | POA: Diagnosis not present

## 2019-05-10 DIAGNOSIS — Z01818 Encounter for other preprocedural examination: Secondary | ICD-10-CM | POA: Diagnosis not present

## 2019-05-10 DIAGNOSIS — R5383 Other fatigue: Secondary | ICD-10-CM | POA: Diagnosis not present

## 2019-05-10 NOTE — Telephone Encounter (Signed)
Left message on voicemail with name and callback number Sara Wallace RN Navigator Cardiac Imaging [facility] [phone] Office [phone] Cell  

## 2019-05-11 ENCOUNTER — Telehealth (HOSPITAL_COMMUNITY): Payer: Self-pay | Admitting: Emergency Medicine

## 2019-05-11 LAB — BASIC METABOLIC PANEL
BUN/Creatinine Ratio: 24 (ref 12–28)
BUN: 18 mg/dL (ref 8–27)
CO2: 17 mmol/L — ABNORMAL LOW (ref 20–29)
Calcium: 9.5 mg/dL (ref 8.7–10.3)
Chloride: 104 mmol/L (ref 96–106)
Creatinine, Ser: 0.75 mg/dL (ref 0.57–1.00)
GFR calc Af Amer: 86 mL/min/{1.73_m2} (ref >59)
GFR calc non Af Amer: 74 mL/min/{1.73_m2} (ref >59)
Glucose: 155 mg/dL — ABNORMAL HIGH (ref 65–99)
Potassium: 4.5 mmol/L (ref 3.5–5.2)
Sodium: 140 mmol/L (ref 134–144)

## 2019-05-11 NOTE — Telephone Encounter (Signed)
Reaching out to patient to offer assistance regarding upcoming cardiac imaging study; pt verbalizes understanding of appt date/time, parking situation and where to check in, pre-test NPO status and medications ordered, and verified current allergies; name and call back number provided for further questions should they arise Marchia Bond RN Navigator Cardiac Imaging [facility] [phone] office [phone] cell  Pts HR 48 in last office visit, instructed patient NOT to take 100mg  metoprolol for cardiac CTA tomorrow. Pt currently being worked up for bradycardia

## 2019-05-12 ENCOUNTER — Ambulatory Visit (HOSPITAL_COMMUNITY): Admission: RE | Admit: 2019-05-12 | Payer: Medicare HMO | Source: Ambulatory Visit

## 2019-05-12 ENCOUNTER — Ambulatory Visit (HOSPITAL_COMMUNITY)
Admission: RE | Admit: 2019-05-12 | Discharge: 2019-05-12 | Disposition: A | Discharge status: Home / Self Care | Payer: Medicare HMO | Source: Ambulatory Visit | Attending: Cardiology | Admitting: Cardiology

## 2019-05-12 ENCOUNTER — Other Ambulatory Visit: Payer: Self-pay

## 2019-05-12 DIAGNOSIS — I2510 Atherosclerotic heart disease of native coronary artery without angina pectoris: Secondary | ICD-10-CM | POA: Diagnosis not present

## 2019-05-12 DIAGNOSIS — R001 Bradycardia, unspecified: Secondary | ICD-10-CM

## 2019-05-12 DIAGNOSIS — R0609 Other forms of dyspnea: Secondary | ICD-10-CM | POA: Diagnosis not present

## 2019-05-12 DIAGNOSIS — R0600 Dyspnea, unspecified: Secondary | ICD-10-CM

## 2019-05-12 DIAGNOSIS — R5383 Other fatigue: Secondary | ICD-10-CM | POA: Diagnosis not present

## 2019-05-12 DIAGNOSIS — Z01818 Encounter for other preprocedural examination: Secondary | ICD-10-CM | POA: Diagnosis not present

## 2019-05-12 MED ORDER — NITROGLYCERIN 0.4 MG SL SUBL
SUBLINGUAL_TABLET | SUBLINGUAL | Status: AC
  Administered 2019-05-12: 0.8 mg via SUBLINGUAL
  Filled 2019-05-12: qty 2

## 2019-05-12 MED ORDER — IOHEXOL 350 MG/ML SOLN
100.0000 mL | Freq: Once | INTRAVENOUS | Status: AC | PRN
  Administered 2019-05-12: 100 mL via INTRAVENOUS

## 2019-05-12 MED ORDER — NITROGLYCERIN 0.4 MG SL SUBL
0.8000 mg | SUBLINGUAL_TABLET | Freq: Once | SUBLINGUAL | Status: AC
  Administered 2019-05-12: 0.8 mg via SUBLINGUAL

## 2019-05-18 DIAGNOSIS — E039 Hypothyroidism, unspecified: Secondary | ICD-10-CM | POA: Diagnosis not present

## 2019-05-18 DIAGNOSIS — Z5181 Encounter for therapeutic drug level monitoring: Secondary | ICD-10-CM | POA: Diagnosis not present

## 2019-05-18 DIAGNOSIS — R001 Bradycardia, unspecified: Secondary | ICD-10-CM | POA: Diagnosis not present

## 2019-05-27 DIAGNOSIS — R001 Bradycardia, unspecified: Secondary | ICD-10-CM | POA: Diagnosis not present

## 2019-06-07 DIAGNOSIS — Z01419 Encounter for gynecological examination (general) (routine) without abnormal findings: Secondary | ICD-10-CM | POA: Diagnosis not present

## 2019-06-07 DIAGNOSIS — Z1231 Encounter for screening mammogram for malignant neoplasm of breast: Secondary | ICD-10-CM | POA: Diagnosis not present

## 2019-06-07 DIAGNOSIS — N8111 Cystocele, midline: Secondary | ICD-10-CM | POA: Diagnosis not present

## 2019-06-13 ENCOUNTER — Other Ambulatory Visit: Payer: Self-pay | Admitting: Obstetrics and Gynecology

## 2019-06-13 DIAGNOSIS — R928 Other abnormal and inconclusive findings on diagnostic imaging of breast: Secondary | ICD-10-CM

## 2019-06-16 ENCOUNTER — Other Ambulatory Visit: Payer: Self-pay | Admitting: Obstetrics and Gynecology

## 2019-06-16 ENCOUNTER — Ambulatory Visit
Admission: RE | Admit: 2019-06-16 | Discharge: 2019-06-16 | Disposition: A | Discharge status: Home / Self Care | Payer: Medicare HMO | Source: Ambulatory Visit | Attending: Obstetrics and Gynecology | Admitting: Obstetrics and Gynecology

## 2019-06-16 ENCOUNTER — Other Ambulatory Visit: Payer: Self-pay

## 2019-06-16 DIAGNOSIS — R928 Other abnormal and inconclusive findings on diagnostic imaging of breast: Secondary | ICD-10-CM

## 2019-06-16 DIAGNOSIS — R921 Mammographic calcification found on diagnostic imaging of breast: Secondary | ICD-10-CM | POA: Diagnosis not present

## 2019-06-16 DIAGNOSIS — N6320 Unspecified lump in the left breast, unspecified quadrant: Secondary | ICD-10-CM

## 2019-06-16 DIAGNOSIS — N6322 Unspecified lump in the left breast, upper inner quadrant: Secondary | ICD-10-CM | POA: Diagnosis not present

## 2019-06-16 DIAGNOSIS — N6321 Unspecified lump in the left breast, upper outer quadrant: Secondary | ICD-10-CM | POA: Diagnosis not present

## 2019-06-21 ENCOUNTER — Telehealth: Payer: Self-pay | Admitting: *Deleted

## 2019-06-21 NOTE — Telephone Encounter (Signed)
Called patient in regards to monitor report .   She verbalized understanding. Patient wanted to know about  CCTA RESULTS .  RN  INFORMED PATIENT RESULTS WAS GIVEN TO EARLIER ON 05/24/19. PATIENT STATES DURING THE TEST  -   STAFF INFORMED HER THE MACHINE IS NOT WORKING PROPERLY ,BUT THEY DID THE TEST ANYWAY - SHE DOES NOT THINK SHE SHOULD PAY FOR THE TEST SINCE THERE WAS NOT A GOOD  RESULT." I THINK I WILL CALL MEDICARE AND TELL THEM NOT TO PAY "   RN INFORMED HER  WILL BE CALLED TO SEND TO BILLING FOR HER TO DISCUSS  THE ISSUE. FORWARD TO BILLING DEPT.  PATIENT WOULD LIKE FOR SOMEONE TO CONTACT HER ABOUT THIS ISSUE

## 2019-06-22 ENCOUNTER — Other Ambulatory Visit: Payer: Self-pay

## 2019-06-22 ENCOUNTER — Ambulatory Visit
Admission: RE | Admit: 2019-06-22 | Discharge: 2019-06-22 | Disposition: A | Discharge status: Home / Self Care | Payer: Medicare HMO | Source: Ambulatory Visit | Attending: Obstetrics and Gynecology | Admitting: Obstetrics and Gynecology

## 2019-06-22 DIAGNOSIS — C50919 Malignant neoplasm of unspecified site of unspecified female breast: Secondary | ICD-10-CM

## 2019-06-22 DIAGNOSIS — N6320 Unspecified lump in the left breast, unspecified quadrant: Secondary | ICD-10-CM

## 2019-06-22 DIAGNOSIS — C7800 Secondary malignant neoplasm of unspecified lung: Secondary | ICD-10-CM

## 2019-06-22 DIAGNOSIS — C50812 Malignant neoplasm of overlapping sites of left female breast: Secondary | ICD-10-CM | POA: Diagnosis not present

## 2019-06-22 DIAGNOSIS — N6322 Unspecified lump in the left breast, upper inner quadrant: Secondary | ICD-10-CM | POA: Diagnosis not present

## 2019-06-22 HISTORY — DX: Secondary malignant neoplasm of unspecified lung: C78.00

## 2019-06-22 HISTORY — DX: Malignant neoplasm of unspecified site of unspecified female breast: C50.919

## 2019-06-29 ENCOUNTER — Encounter: Payer: Self-pay | Admitting: Adult Health

## 2019-06-29 DIAGNOSIS — Z171 Estrogen receptor negative status [ER-]: Secondary | ICD-10-CM | POA: Insufficient documentation

## 2019-06-29 DIAGNOSIS — C50412 Malignant neoplasm of upper-outer quadrant of left female breast: Secondary | ICD-10-CM | POA: Insufficient documentation

## 2019-07-08 ENCOUNTER — Ambulatory Visit: Payer: Self-pay | Admitting: Surgery

## 2019-07-08 DIAGNOSIS — C50912 Malignant neoplasm of unspecified site of left female breast: Secondary | ICD-10-CM

## 2019-07-08 NOTE — H&P (View-Only) (Signed)
Alisha [name] Documented: 07/08/2019 11:13 AM Location: [facility] Patient #: [id number] DOB: 06-02-1935 Single / Language: Alisha [name] / Race: Black or African American Female  History of Present Illness (Alisha A. Cornett MD; 07/08/2019 11:41 AM) Patient words: Patient sent at the request of Dr. Shon Hale for a screen detected mammographic abnormality left breast. Patient denies a history of breast pain, breast mass or nipple discharge. She had a lumpectomy in 2017 for a radial scar. Family history of breast cancer.                        Patient returns after screening study for evaluation of possible mass and adjacent calcifications in the LEFT breast. Patient had an ultrasound-guided core biopsy of the LEFT breast in 2017 demonstrating a complex sclerosing lesion. Subsequent excision showed benign radius are with usual duct hyperplasia, biopsy site, and no atypia or malignancy.  EXAM: DIGITAL DIAGNOSTIC LEFT MAMMOGRAM WITH CAD AND TOMO  ULTRASOUND LEFT BREAST  COMPARISON: Previous exam(s).  ACR Breast Density Category b: There are scattered areas of fibroglandular density.  FINDINGS: Additional 2-D and 3-D images are performed. These views confirm a small spiculated mass in the [place] of the LEFT breast. 4 millimeters anterior to the mass there is a group of 3 oval calcifications, indeterminate in their appearance for malignancy. Surgical clips are identified in the UPPER central portion of the LEFT breast following previous excision of benign complex sclerosing lesion.  Mammographic images were processed with CAD.  On physical exam, I palpate soft thickening in the UPPER central aspect of the LEFT breast.  Targeted ultrasound is performed, showing an irregular mass with irregular margins in the 12 o'clock location of the LEFT breast 10 centimeters from the nipple which measures 0.7 x 0.6 x 0.7 centimeters. There is  associated internal blood flow. Evaluation of the LEFT axilla is negative for adenopathy.  IMPRESSION: Suspicious mass in the 12 o'clock location of the LEFT breast for which biopsy is recommended.  Small indeterminate group of calcifications 4 millimeters anterior to the mass warrants excision if the mass is positive for malignancy.  RECOMMENDATION: Ultrasound-guided core biopsy of mass in the 12 o'clock location of the LEFT breast.  Small indeterminate group of calcifications 4 millimeters anterior to the mass warrants excision if the mass is positive for malignancy.  I have discussed the findings and recommendations with the patient. Results were also provided in writing at the conclusion of the visit. If applicable, a reminder letter will be sent to the patient regarding the next appointment.  BI-RADS CATEGORY 4: Suspicious.   Electronically Signed By: Nolon Nations M.D. On: 06/16/2019 12:17          ADDITIONAL INFORMATION: PROGNOSTIC INDICATORS Results: IMMUNOHISTOCHEMICAL AND MORPHOMETRIC ANALYSIS PERFORMED MANUALLY The tumor cells are NEGATIVE for Her2 (0). Estrogen Receptor: 0%, NEGATIVE Progesterone Receptor: 0%, NEGATIVE Proliferation Marker Ki67: 30% COMMENT: The negative hormone receptor study(ies) in this case has No internal positive control. REFERENCE RANGE ESTROGEN RECEPTOR NEGATIVE 0% POSITIVE =>1% REFERENCE RANGE PROGESTERONE RECEPTOR NEGATIVE 0% POSITIVE =>1% All controls stained appropriately Claudette Laws MD Pathologist, Electronic Signature ( Signed 06/28/2019) FINAL DIAGNOSIS Diagnosis Breast, left, needle core biopsy, 12 o'clock - INVASIVE DUCTAL CARCINOMA, GRADE III. 1 of 2 FINAL for KARL, KNARR ([id number]) Microscopic Comment The greatest dimension in a single core is 1 cm. Breast prognostic profile will be performed. Dr. Tresa Moore agrees. Called to [facility] on 06/23/19. (JDP:gt,  06/23/19) Claudette Laws MD Pathologist, Electronic Signature (Case signed 06/23/2019) Specimen Gross and Clinical Information Specimen Comment TIF: 3:20 PM; extracted < 1 min; left breast mass Specimen(s) Obtained: Breast, left, needle core biopsy, 12 o'clock Specimen Clinical Information Suspect malignancy Gross Received in formalin (TIF=3:20pm, CIT < 1 minute) labeled with the patient's name and "Left breast 12:00" are four cores of tan yellow soft tissue ranging from 1.1 to 1.3 cm, entirely submitted in one cassette. (AK 06/22/2019) Stain(s) used in Diagnosis: The following stain(s) were used in diagnosing the case: ER-ACIS, PR-ACIS, Her2 by IHC, KI-67-ACIS. The control(s) stained appropriately. Disclaimer Estrogen receptor (6F11), immunohistochemical stains are performed on formalin fixed, paraffin embedded tissue using a 3,3"-diaminobenzidine (DAB) chromogen and Leica Bond Autostainer System. The staining intensity of the nucleus is scored manually and is reported as the percentage of tumor cell nuclei demonstrating specific nuclear staining.Specimens are fixed in 10% Neutral Buffered Formalin for at least 6 hours and up to 72 hours. These tests have not be validated on decalcified tissue. Results should be interpreted with caution given the possibility of false negative results on decalcified specimens. IHC scores are reported using ASCO/CAP scoring criteria. An IHC Score of 0 or 1+ is NEGATIVE for HER2, 3+ is POSITIVE for HER2, and 2+ is EQUIVOCAL. Equivocal results are reflexed to either FISH or IHC testing. Specimens are fixed in 10% Neutral Buffered Formalin for at least 6 hours and up to 72 hours. These tests have.  The patient is a 84 year old female.   Allergies Sallyanne Kuster, CMA; 07/08/2019 11:13 AM) Penicillin G Pot in Dextrose *PENICILLINS* Sulfa 10 *OPHTHALMIC AGENTS* Norvasc *CALCIUM CHANNEL BLOCKERS* Benicar *ANTIHYPERTENSIVES* Lisinopril  *ANTIHYPERTENSIVES* Allergies Reconciled  Medication History Sallyanne Kuster, CMA; 07/08/2019 11:16 AM) Levothyroxine Sodium (88MCG Tablet, Oral) Active. ProAir HFA (108 (90 Base)MCG/ACT Aerosol Soln, Inhalation) Active. Centrum Silver (Oral) Active. Biotin (1000MCG Tablet, Oral) Active. Diltiazem HCl (60MG Capsule ER 12HR, Oral) Active. NUTRITIONAL SUPPLEMENT (COLON FORMAL) Active. Medications Reconciled    Vitals Sallyanne Kuster CMA; 07/08/2019 11:17 AM) 07/08/2019 11:16 AM Weight: 2258 lb Height: 64in Body Surface Area: 5.48 m Body Mass Index: 387.58 kg/m  Temp.: 98.43F  Pulse: 53 (Regular)  P.OX: 96% (Room air) BP: 140/70 (Sitting, Left Arm, Standard)        Physical Exam (Alisha A. Cornett MD; 07/08/2019 11:41 AM)  General Mental Status-Alert. General Appearance-Consistent with stated age. Hydration-Well hydrated. Voice-Normal.  Head and Neck Head-normocephalic, atraumatic with no lesions or palpable masses. Trachea-midline. Thyroid Gland Characteristics - normal size and consistency.  Chest and Lung Exam Chest and lung exam reveals -quiet, even and easy respiratory effort with no use of accessory muscles and on auscultation, normal breath sounds, no adventitious sounds and normal vocal resonance. Inspection Chest Wall - Normal. Back - normal.  Breast Breast - Left-Symmetric, Non Tender, No Biopsy scars, no Dimpling - Left, No Inflammation, No Lumpectomy scars, No Mastectomy scars, No Peau d' Orange. Breast - Right-Symmetric, Non Tender, No Biopsy scars, no Dimpling - Right, No Inflammation, No Lumpectomy scars, No Mastectomy scars, No Peau d' Orange. Breast Lump-No Palpable Breast Mass.  Cardiovascular Cardiovascular examination reveals -normal heart sounds, regular rate and rhythm with no murmurs and normal pedal pulses bilaterally.  Musculoskeletal Normal Exam - Left-Upper Extremity Strength Normal and Lower Extremity  Strength Normal. Normal Exam - Right-Upper Extremity Strength Normal and Lower Extremity Strength Normal.  Lymphatic Head & Neck  General Head & Neck Lymphatics: Bilateral - Description - Normal. Axillary  General Axillary Region: Bilateral -  Description - Normal. Tenderness - Non Tender.    Assessment & Plan (Alisha A. Cornett MD; 07/08/2019 11:42 AM)  BREAST CANCER, LEFT (C50.912) Impression: Discussed surgical options with her. She is triple negative therefore we'll refer to medical and radiation oncology. It is less than 1 cm and I recommended left breast lumpectomy with sentinel lymph node mapping due to her triple negative state. She is in very good health otherwise. Risk of lumpectomy include bleeding, infection, seroma, more surgery, use of seed/wire, wound care, cosmetic deformity and the need for other treatments, death , blood clots, death. Pt agrees to proceed. Risk of sentinel lymph node mapping include bleeding, infection, lymphedema, shoulder pain. stiffness, dye allergy. cosmetic deformity , blood clots, death, need for more surgery. Pt agres to proceed.  Current Plans You are being scheduled for surgery- Our schedulers will call you.  You should hear from our office's scheduling department within 5 working days about the location, date, and time of surgery. We try to make accommodations for patient's preferences in scheduling surgery, but sometimes the OR schedule or the surgeon's schedule prevents [place] from making those accommodations.  If you have not heard from our office [phone] in 5 working days, call the office and ask for your surgeon's nurse.  If you have other questions about your diagnosis, plan, or surgery, call the office and ask for your surgeon's nurse.  Pt Education - [facility] Breast Cancer Information Given - Alight "Breast Journey" Package We discussed the staging and pathophysiology of breast cancer. We discussed all of the different options for  treatment for breast cancer including surgery, chemotherapy, radiation therapy, Herceptin, and antiestrogen therapy. We discussed a sentinel lymph node biopsy as she does not appear to having lymph node involvement right now. We discussed the performance of that with injection of radioactive tracer and blue dye. We discussed that she would have an incision underneath her axillary hairline. We discussed that there is a bout a 10-20% chance of having a positive node with a sentinel lymph node biopsy and we will await the permanent pathology to make any other first further decisions in terms of her treatment. One of these options might be to return to the operating room to perform an axillary lymph node dissection. We discussed about a 1-2% risk lifetime of chronic shoulder pain as well as lymphedema associated with a sentinel lymph node biopsy. We discussed the options for treatment of the breast cancer which included lumpectomy versus a mastectomy. We discussed the performance of the lumpectomy with a wire placement. We discussed a 10-20% chance of a positive margin requiring reexcision in the operating room. We also discussed that she may need radiation therapy or antiestrogen therapy or both if she undergoes lumpectomy. We discussed the mastectomy and the postoperative care for that as well. We discussed that there is no difference in her survival whether she undergoes lumpectomy with radiation therapy or antiestrogen therapy versus a mastectomy. There is a slight difference in the local recurrence rate being 3-5% with lumpectomy and about 1% with a mastectomy. We discussed the risks of operation including bleeding, infection, possible reoperation. She understands her further therapy will be based on what her stages at the time of her operation.  Pt Education - flb breast cancer surgery: discussed with patient and provided information. Pt Education - [facility] Breast Biopsy HCI: discussed with patient and provided  information. Pt Education - ABC (After Breast Cancer) Class Info: discussed with patient and provided information.

## 2019-07-08 NOTE — H&P (Signed)
Alisha [name] Documented: 07/08/2019 11:13 AM Location: [facility] Patient #: [id number] DOB: 01-13-1935 Single / Language: [name] / Race: Black or African American Female  History of Present Illness ([name] MD; 07/08/2019 11:41 AM) Patient words: Patient sent at the request of Dr. Shon Hale for a screen detected mammographic abnormality left breast. Patient denies a history of breast pain, breast mass or nipple discharge. She had a lumpectomy in 2017 for a radial scar. Family history of breast cancer.                        Patient returns after screening study for evaluation of possible mass and adjacent calcifications in the LEFT breast. Patient had an ultrasound-guided core biopsy of the LEFT breast in 2017 demonstrating a complex sclerosing lesion. Subsequent excision showed benign radius are with usual duct hyperplasia, biopsy site, and no atypia or malignancy.  EXAM: DIGITAL DIAGNOSTIC LEFT MAMMOGRAM WITH CAD AND TOMO  ULTRASOUND LEFT BREAST  COMPARISON: Previous exam(s).  ACR Breast Density Category b: There are scattered areas of fibroglandular density.  FINDINGS: Additional 2-D and 3-D images are performed. These views confirm a small spiculated mass in the [place] of the LEFT breast. 4 millimeters anterior to the mass there is a group of 3 oval calcifications, indeterminate in their appearance for malignancy. Surgical clips are identified in the UPPER central portion of the LEFT breast following previous excision of benign complex sclerosing lesion.  Mammographic images were processed with CAD.  On physical exam, I palpate soft thickening in the UPPER central aspect of the LEFT breast.  Targeted ultrasound is performed, showing an irregular mass with irregular margins in the 12 o'clock location of the LEFT breast 10 centimeters from the nipple which measures 0.7 x 0.6 x 0.7 centimeters. There is  associated internal blood flow. Evaluation of the LEFT axilla is negative for adenopathy.  IMPRESSION: Suspicious mass in the 12 o'clock location of the LEFT breast for which biopsy is recommended.  Small indeterminate group of calcifications 4 millimeters anterior to the mass warrants excision if the mass is positive for malignancy.  RECOMMENDATION: Ultrasound-guided core biopsy of mass in the 12 o'clock location of the LEFT breast.  Small indeterminate group of calcifications 4 millimeters anterior to the mass warrants excision if the mass is positive for malignancy.  I have discussed the findings and recommendations with the patient. Results were also provided in writing at the conclusion of the visit. If applicable, a reminder letter will be sent to the patient regarding the next appointment.  BI-RADS CATEGORY 4: Suspicious.   Electronically Signed By: Nolon Nations M.D. On: 06/16/2019 12:17          ADDITIONAL INFORMATION: PROGNOSTIC INDICATORS Results: IMMUNOHISTOCHEMICAL AND MORPHOMETRIC ANALYSIS PERFORMED MANUALLY The tumor cells are NEGATIVE for Her2 (0). Estrogen Receptor: 0%, NEGATIVE Progesterone Receptor: 0%, NEGATIVE Proliferation Marker Ki67: 30% COMMENT: The negative hormone receptor study(ies) in this case has No internal positive control. REFERENCE RANGE ESTROGEN RECEPTOR NEGATIVE 0% POSITIVE =>1% REFERENCE RANGE PROGESTERONE RECEPTOR NEGATIVE 0% POSITIVE =>1% All controls stained appropriately Claudette Laws MD Pathologist, Electronic Signature ( Signed 06/28/2019) FINAL DIAGNOSIS Diagnosis Breast, left, needle core biopsy, 12 o'clock - INVASIVE DUCTAL CARCINOMA, GRADE III. 1 of 2 FINAL for SAKURA, DENIS ([id number]) Microscopic Comment The greatest dimension in a single core is 1 cm. Breast prognostic profile will be performed. Dr. Tresa Moore agrees. Called to [facility] on 06/23/19. (JDP:gt,  06/23/19) Claudette Laws MD Pathologist, Electronic Signature (Case signed 06/23/2019) Specimen Gross and Clinical Information Specimen Comment TIF: 3:20 PM; extracted < 1 min; left breast mass Specimen(s) Obtained: Breast, left, needle core biopsy, 12 o'clock Specimen Clinical Information Suspect malignancy Gross Received in formalin (TIF=3:20pm, CIT < 1 minute) labeled with the patient's name and "Left breast 12:00" are four cores of tan yellow soft tissue ranging from 1.1 to 1.3 cm, entirely submitted in one cassette. (AK 06/22/2019) Stain(s) used in Diagnosis: The following stain(s) were used in diagnosing the case: ER-ACIS, PR-ACIS, Her2 by IHC, KI-67-ACIS. The control(s) stained appropriately. Disclaimer Estrogen receptor (6F11), immunohistochemical stains are performed on formalin fixed, paraffin embedded tissue using a 3,3"-diaminobenzidine (DAB) chromogen and Leica Bond Autostainer System. The staining intensity of the nucleus is scored manually and is reported as the percentage of tumor cell nuclei demonstrating specific nuclear staining.Specimens are fixed in 10% Neutral Buffered Formalin for at least 6 hours and up to 72 hours. These tests have not be validated on decalcified tissue. Results should be interpreted with caution given the possibility of false negative results on decalcified specimens. IHC scores are reported using ASCO/CAP scoring criteria. An IHC Score of 0 or 1+ is NEGATIVE for HER2, 3+ is POSITIVE for HER2, and 2+ is EQUIVOCAL. Equivocal results are reflexed to either FISH or IHC testing. Specimens are fixed in 10% Neutral Buffered Formalin for at least 6 hours and up to 72 hours. These tests have.  The patient is a 84 year old female.   Allergies Sallyanne Kuster, CMA; 07/08/2019 11:13 AM) Penicillin G Pot in Dextrose *PENICILLINS* Sulfa 10 *OPHTHALMIC AGENTS* Norvasc *CALCIUM CHANNEL BLOCKERS* Benicar *ANTIHYPERTENSIVES* Lisinopril  *ANTIHYPERTENSIVES* Allergies Reconciled  Medication History Sallyanne Kuster, CMA; 07/08/2019 11:16 AM) Levothyroxine Sodium (88MCG Tablet, Oral) Active. ProAir HFA (108 (90 Base)MCG/ACT Aerosol Soln, Inhalation) Active. Centrum Silver (Oral) Active. Biotin (1000MCG Tablet, Oral) Active. Diltiazem HCl (60MG Capsule ER 12HR, Oral) Active. NUTRITIONAL SUPPLEMENT (COLON FORMAL) Active. Medications Reconciled    Vitals Sallyanne Kuster CMA; 07/08/2019 11:17 AM) 07/08/2019 11:16 AM Weight: 2258 lb Height: 64in Body Surface Area: 5.48 m Body Mass Index: 387.58 kg/m  Temp.: 98.80F  Pulse: 53 (Regular)  P.OX: 96% (Room air) BP: 140/70 (Sitting, Left Arm, Standard)        Physical Exam ([name] MD; 07/08/2019 11:41 AM)  General Mental Status-Alert. General Appearance-Consistent with stated age. Hydration-Well hydrated. Voice-Normal.  Head and Neck Head-normocephalic, atraumatic with no lesions or palpable masses. Trachea-midline. Thyroid Gland Characteristics - normal size and consistency.  Chest and Lung Exam Chest and lung exam reveals -quiet, even and easy respiratory effort with no use of accessory muscles and on auscultation, normal breath sounds, no adventitious sounds and normal vocal resonance. Inspection Chest Wall - Normal. Back - normal.  Breast Breast - Left-Symmetric, Non Tender, No Biopsy scars, no Dimpling - Left, No Inflammation, No Lumpectomy scars, No Mastectomy scars, No Peau d' Orange. Breast - Right-Symmetric, Non Tender, No Biopsy scars, no Dimpling - Right, No Inflammation, No Lumpectomy scars, No Mastectomy scars, No Peau d' Orange. Breast Lump-No Palpable Breast Mass.  Cardiovascular Cardiovascular examination reveals -normal heart sounds, regular rate and rhythm with no murmurs and normal pedal pulses bilaterally.  Musculoskeletal Normal Exam - Left-Upper Extremity Strength Normal and Lower Extremity  Strength Normal. Normal Exam - Right-Upper Extremity Strength Normal and Lower Extremity Strength Normal.  Lymphatic Head & Neck  General Head & Neck Lymphatics: Bilateral - Description - Normal. Axillary  General Axillary Region: Bilateral -  Description - Normal. Tenderness - Non Tender.    Assessment & Plan ([name] MD; 07/08/2019 11:42 AM)  BREAST CANCER, LEFT (C50.912) Impression: Discussed surgical options with her. She is triple negative therefore we'll refer to medical and radiation oncology. It is less than 1 cm and I recommended left breast lumpectomy with sentinel lymph node mapping due to her triple negative state. She is in very good health otherwise. Risk of lumpectomy include bleeding, infection, seroma, more surgery, use of seed/wire, wound care, cosmetic deformity and the need for other treatments, death , blood clots, death. Pt agrees to proceed. Risk of sentinel lymph node mapping include bleeding, infection, lymphedema, shoulder pain. stiffness, dye allergy. cosmetic deformity , blood clots, death, need for more surgery. Pt agres to proceed.  Current Plans You are being scheduled for surgery- Our schedulers will call you.  You should hear from our office's scheduling department within 5 working days about the location, date, and time of surgery. We try to make accommodations for patient's preferences in scheduling surgery, but sometimes the OR schedule or the surgeon's schedule prevents [place] from making those accommodations.  If you have not heard from our office [phone]) in 5 working days, call the office and ask for your surgeon's nurse.  If you have other questions about your diagnosis, plan, or surgery, call the office and ask for your surgeon's nurse.  Pt Education - [facility] Breast Cancer Information Given - Alight "Breast Journey" Package We discussed the staging and pathophysiology of breast cancer. We discussed all of the different options for  treatment for breast cancer including surgery, chemotherapy, radiation therapy, Herceptin, and antiestrogen therapy. We discussed a sentinel lymph node biopsy as she does not appear to having lymph node involvement right now. We discussed the performance of that with injection of radioactive tracer and blue dye. We discussed that she would have an incision underneath her axillary hairline. We discussed that there is a bout a 10-20% chance of having a positive node with a sentinel lymph node biopsy and we will await the permanent pathology to make any other first further decisions in terms of her treatment. One of these options might be to return to the operating room to perform an axillary lymph node dissection. We discussed about a 1-2% risk lifetime of chronic shoulder pain as well as lymphedema associated with a sentinel lymph node biopsy. We discussed the options for treatment of the breast cancer which included lumpectomy versus a mastectomy. We discussed the performance of the lumpectomy with a wire placement. We discussed a 10-20% chance of a positive margin requiring reexcision in the operating room. We also discussed that she may need radiation therapy or antiestrogen therapy or both if she undergoes lumpectomy. We discussed the mastectomy and the postoperative care for that as well. We discussed that there is no difference in her survival whether she undergoes lumpectomy with radiation therapy or antiestrogen therapy versus a mastectomy. There is a slight difference in the local recurrence rate being 3-5% with lumpectomy and about 1% with a mastectomy. We discussed the risks of operation including bleeding, infection, possible reoperation. She understands her further therapy will be based on what her stages at the time of her operation.  Pt Education - flb breast cancer surgery: discussed with patient and provided information. Pt Education - [facility] Breast Biopsy HCI: discussed with patient and provided  information. Pt Education - ABC (After Breast Cancer) Class Info: discussed with patient and provided information.

## 2019-07-13 ENCOUNTER — Other Ambulatory Visit: Payer: Self-pay | Admitting: Surgery

## 2019-07-13 ENCOUNTER — Telehealth: Payer: Self-pay | Admitting: Oncology

## 2019-07-13 ENCOUNTER — Encounter: Payer: Self-pay | Admitting: Oncology

## 2019-07-13 DIAGNOSIS — C50912 Malignant neoplasm of unspecified site of left female breast: Secondary | ICD-10-CM

## 2019-07-13 NOTE — Telephone Encounter (Signed)
Received a new patient referral from [facility] for invasive ductal carcinoma. A new patient breast appt has been scheduled to see Dr. Jana Hakim on 10/1 at 4pm with labs at 330pm. I lft the appt information on her voicemail. Letter mailed.

## 2019-07-14 ENCOUNTER — Telehealth: Payer: Self-pay | Admitting: Radiation Oncology

## 2019-07-14 ENCOUNTER — Telehealth: Payer: Self-pay | Admitting: Oncology

## 2019-07-14 NOTE — Telephone Encounter (Signed)
New message:   LVM for patient to return call to schedule appt from referral received.

## 2019-07-14 NOTE — Telephone Encounter (Signed)
Pt has been confirmed for appt w/Dr. Jana Hakim on 10/1 at 4pm w/labs at 3:30pm. She's been made aware to arrive 15 minutes.

## 2019-07-15 ENCOUNTER — Ambulatory Visit: Payer: Medicare HMO | Admitting: Cardiology

## 2019-07-18 ENCOUNTER — Encounter: Payer: Self-pay | Admitting: *Deleted

## 2019-07-20 ENCOUNTER — Other Ambulatory Visit: Payer: Self-pay | Admitting: *Deleted

## 2019-07-20 ENCOUNTER — Encounter: Payer: Self-pay | Admitting: Cardiology

## 2019-07-20 ENCOUNTER — Other Ambulatory Visit: Payer: Self-pay

## 2019-07-20 ENCOUNTER — Ambulatory Visit (INDEPENDENT_AMBULATORY_CARE_PROVIDER_SITE_OTHER): Payer: Medicare HMO | Admitting: Cardiology

## 2019-07-20 VITALS — BP 170/100 | HR 71 | Temp 96.8°F | Ht 64.5 in | Wt 224.6 lb

## 2019-07-20 DIAGNOSIS — R0600 Dyspnea, unspecified: Secondary | ICD-10-CM

## 2019-07-20 DIAGNOSIS — C50412 Malignant neoplasm of upper-outer quadrant of left female breast: Secondary | ICD-10-CM

## 2019-07-20 DIAGNOSIS — R001 Bradycardia, unspecified: Secondary | ICD-10-CM | POA: Diagnosis not present

## 2019-07-20 DIAGNOSIS — R0609 Other forms of dyspnea: Secondary | ICD-10-CM | POA: Diagnosis not present

## 2019-07-20 DIAGNOSIS — Z01810 Encounter for preprocedural cardiovascular examination: Secondary | ICD-10-CM | POA: Insufficient documentation

## 2019-07-20 DIAGNOSIS — Z171 Estrogen receptor negative status [ER-]: Secondary | ICD-10-CM

## 2019-07-20 MED ORDER — OLMESARTAN MEDOXOMIL 20 MG PO TABS: 20.0000 mg | ORAL_TABLET | Freq: Every day | ORAL | 3 refills | Status: DC

## 2019-07-20 NOTE — Progress Notes (Signed)
[facility]  [phone] [phone]     ID: Alisha [name] DOB: 1935/07/16  MR#: [id number]  ZJI#:[id number]  Patient Care Team: [name], MD as PCP - General (Family Medicine) Mauro Kaufmann, RN as Oncology Nurse Navigator Rockwell Germany, RN as Oncology Nurse Navigator Erroll Luna, MD as Consulting Physician (General Surgery) Eppie Gibson, MD as Attending Physician (Radiation Oncology) Alisha Cruel, MD OTHER MD:  CHIEF COMPLAINT: triple negative breast cancer  CURRENT TREATMENT: Awaiting definitive surgery   HISTORY OF CURRENT ILLNESS: Alisha [name] was noted to have a left breast upper-outer quadrant distortion on screening mammogram in 2017. She proceeded to biopsy ([id number]), which showed a complex sclerosing lesion. She opted to undergo left lumpectomy ([id number]) on 08/11/2016 under Dr. Brantley Stage, which revealed a radial scar with calcifications.   She presented for routine screening mammogram, which showed a possible mass and adjacent calcifications in the left breast. She was referred to [facility] for further imaging on 06/16/2019. Physical exam performed that day showed a palpable soft thickening in the upper central aspect of the left breast. She underwent left diagnostic mammography with tomography and ultrasonography showing: breast density category B; suspicious 7 mm mass in the 12 o'clock location of the left breast; small indeterminate group of calcifications 4 mm anterior to the mass warrant excision if the mass is positive for malignancy; left axilla negative for adenopathy.  Accordingly on 06/22/2019 she proceeded to biopsy of the left breast area in question. The pathology from this procedure ([id number]) showed: invasive ductal carcinoma, grade 3. Prognostic indicators significant for: estrogen receptor, 0% negative and progesterone receptor, 0% negative. Proliferation marker Ki67 at 30%. HER2  negative by immunohistochemistry (0).  The patient's subsequent history is as detailed below.   INTERVAL HISTORY: Alisha [name] was evaluated in the breast cancer clinic on 07/21/2019. Her case was also presented at the multidisciplinary breast cancer conference on 06/29/2019. At that time a preliminary plan was proposed: Breast conserving surgery, consideration of chemotherapy, consideration of adjuvant radiation.  Her most recent bone density screening was performed on 07/06/2018 and showed a T-score of 1.0.   REVIEW OF SYSTEMS: There were no specific symptoms leading to the original mammogram, which was routinely scheduled. The patient denies unusual headaches, visual changes, nausea, vomiting, stiff neck, dizziness, or gait imbalance. There has been no cough, phlegm production, or pleurisy, no chest pain or pressure, and no change in bowel or bladder habits. The patient denies fever, rash, bleeding, unexplained fatigue or unexplained weight loss. A detailed review of systems was otherwise entirely negative.   PAST MEDICAL HISTORY: Past Medical History:  Diagnosis Date   Arthritis    Asthma    Never smoker   Blood transfusion    over 64 y ears   Breast mass, left    Hypertension    on meds   Hypothyroidism    on meds    PAST SURGICAL HISTORY: Past Surgical History:  Procedure Laterality Date   ABDOMINAL HYSTERECTOMY     32 yrs   BREAST LUMPECTOMY WITH RADIOACTIVE SEED LOCALIZATION Left 08/11/2016   Procedure: LEFT BREAST LUMPECTOMY WITH RADIOACTIVE SEED LOCALIZATION;  Surgeon: Erroll Luna, MD;  Location: [facility];  Service: General;  Laterality: Left;   CATARACT EXTRACTION, BILATERAL     JOINT REPLACEMENT Bilateral    left knee 2012   KNEE ARTHROPLASTY  02/17/2012   Procedure: COMPUTER ASSISTED TOTAL KNEE ARTHROPLASTY;  Surgeon: Meredith Pel, MD;  Location:  [facility];  Service: Orthopedics;  Laterality: Right;  Right total knee replacement     FAMILY HISTORY: Family History  Problem Relation Age of Onset   Other Mother        died @ 74 - but was healthy   Hypertension Sister    Diabetes Sister    Diabetes Brother        no longer on medications.    Anesthesia problems Neg Hx    Hypotension Neg Hx    Malignant hyperthermia Neg Hx    Pseudochol deficiency Neg Hx    Patient's father was in his mid 25s years old when he died from unknown causes. Patient's mother died from "natural causes" at age 82. The patient denies a family hx of breast or ovarian cancer. She has 5 brothers and 1 sister.   GYNECOLOGIC HISTORY:  No LMP recorded. Patient is postmenopausal. 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  She is now retired.  She describes herself a single.  Her daughter Marisa Hua lives in [place].  She has multiple medical problems.  The patient's son died at age 54.  The patient has 1 grandchild, Johnny Bridge, who lives in [place] and works for [employer].  He has 4 children.  The patient herself attends a local [place] DIRECTIVES: The patient intends to name her grandson Johnny Bridge as her healthcare power of attorney.  He can be reached at [phone].  She was given the appropriate documents to complete and notarize at her discretion at the time of her 07/21/2019 visit   HEALTH MAINTENANCE: Social History   Tobacco Use   Smoking status: Never Smoker   Smokeless tobacco: Never Used  Substance Use Topics   Alcohol use: No   Drug use: No     Allergies  Allergen Reactions   Lisinopril Itching    No energy   Penicillins Itching and Rash    Has patient had a PCN reaction causing  immediate rash, facial/tongue/throat swelling, SOB or lightheadedness with hypotension: No Has patient had a PCN reaction causing severe rash involving mucus membranes or skin necrosis: No Has patient had a PCN reaction that required hospitalization: No Has patient had a PCN reaction occurring within the last 10 years: No If all of the above answers are "NO", then may proceed with Cephalosporin use.   Sulfa Antibiotics Itching and Rash    Current Outpatient Medications  Medication Sig Dispense Refill   acetaminophen (TYLENOL) 500 MG tablet Take 1,000 mg by mouth every 6 (six) hours as needed for moderate pain.      albuterol (PROVENTIL HFA;VENTOLIN HFA) 108 (90 Base) MCG/ACT inhaler Inhale 1-2 puffs into the lungs every 6 (six) hours as needed for wheezing or shortness of breath.      Artificial Tear Solution (SOOTHE XP OP) Place 1 drop into both eyes 3 (three) times daily as needed (dry or irritated eyes).     atenolol (TENORMIN) 25 MG tablet Take 25 mg by mouth daily.     Biotin 5 MG TABS Take 5 mg by mouth daily.      cetaphil (CETAPHIL) cream Apply 1 application topically 2 (two) times daily.      diphenhydrAMINE HCl (  ZZZQUIL) 50 MG/30ML LIQD Take 50 mg by mouth at bedtime as needed (sleep).      fluticasone (FLONASE) 50 MCG/ACT nasal spray Place 2 sprays into both nostrils daily as needed for allergies or rhinitis.     furosemide (LASIX) 20 MG tablet Take 20 mg by mouth daily as needed for fluid or edema.     levothyroxine (SYNTHROID, LEVOTHROID) 88 MCG tablet Take 88 mcg by mouth daily before breakfast.      meclizine (ANTIVERT) 25 MG tablet Take 12.5 mg by mouth 3 (three) times daily as needed for dizziness.     metoprolol tartrate (LOPRESSOR) 50 MG tablet Take 1 tablet (50 mg total) by mouth once for 1 dose. TAKE TWO HOUR PRIOR TO  SCHEDULE CARDAIC TEST (Patient not taking: Reported on 07/19/2019) 1 tablet 0   Multiple Vitamins-Minerals (ICAPS AREDS 2 PO) Take 1 capsule by  mouth 2 (two) times daily.      olmesartan (BENICAR) 20 MG tablet Take 1 tablet (20 mg total) by mouth daily. 90 tablet 3   potassium chloride SA (KLOR-CON) 20 MEQ tablet Take 20 mEq by mouth daily as needed (when taking furosemide).     No current facility-administered medications for this visit.     OBJECTIVE: Older African-American woman who looks well  Vitals:   07/21/19 1610  BP: (!) 168/72  Pulse: (!) 51  Resp: 18  Temp: 98.5 F (36.9 C)  SpO2: 100%     Body mass index is 37.91 kg/m.   Wt Readings from Last 3 Encounters:  07/21/19 224 lb 4.8 oz (101.7 kg)  07/20/19 224 lb 9.6 oz (101.9 kg)  04/21/19 228 lb (103.4 kg)      ECOG FS:1 - Symptomatic but completely ambulatory  Sclerae unicteric, EOMs intact Wearing a mask No cervical or supraclavicular adenopathy Lungs no rales or rhonchi Heart regular rate and rhythm Abd soft, nontender, positive bowel sounds MSK no focal spinal tenderness, no upper extremity lymphedema Neuro: nonfocal, well oriented, appropriate affect Breasts: I do not palpate a mass in either breast.  There are no skin or nipple changes of concern.  Both axillae are benign.    LAB RESULTS:  CMP     Component Value Date/Time   NA 141 07/21/2019 1525   NA 140 05/10/2019 1457   K 4.4 07/21/2019 1525   CL 107 07/21/2019 1525   CO2 25 07/21/2019 1525   GLUCOSE 124 (H) 07/21/2019 1525   BUN 14 07/21/2019 1525   BUN 18 05/10/2019 1457   CREATININE 0.78 07/21/2019 1525   CALCIUM 9.2 07/21/2019 1525   PROT 6.6 07/21/2019 1525   ALBUMIN 4.1 07/21/2019 1525   AST 15 07/21/2019 1525   ALT 19 07/21/2019 1525   ALKPHOS 80 07/21/2019 1525   BILITOT 0.2 (L) 07/21/2019 1525   GFRNONAA >60 07/21/2019 1525   GFRAA >60 07/21/2019 1525    No results found for: TOTALPROTELP, ALBUMINELP, A1GS, A2GS, BETS, BETA2SER, GAMS, MSPIKE, SPEI  No results found for: KPAFRELGTCHN, LAMBDASER, KAPLAMBRATIO  Lab Results  Component Value Date   WBC 11.1 (H)  07/21/2019   NEUTROABS 6.6 07/21/2019   HGB 12.7 07/21/2019   HCT 39.6 07/21/2019   MCV 95.7 07/21/2019   PLT 251 07/21/2019    '@LASTCHEMISTRY' @  No results found for: LABCA2  No components found for: IHWTUU828  No results for input(s): INR in the last 168 hours.  No results found for: LABCA2  No results found for: MKL491  No results found for:  ZOX096  No results found for: EAV409  No results found for: CA2729  No components found for: HGQUANT  No results found for: CEA1 / No results found for: CEA1   No results found for: AFPTUMOR  No results found for: CHROMOGRNA  No results found for: PSA1  Appointment on 07/21/2019  Component Date Value Ref Range Status   Sodium 07/21/2019 141  135 - 145 mmol/L Final   Potassium 07/21/2019 4.4  3.5 - 5.1 mmol/L Final   Chloride 07/21/2019 107  98 - 111 mmol/L Final   CO2 07/21/2019 25  22 - 32 mmol/L Final   Glucose, Bld 07/21/2019 124* 70 - 99 mg/dL Final   BUN 07/21/2019 14  8 - 23 mg/dL Final   Creatinine 07/21/2019 0.78  0.44 - 1.00 mg/dL Final   Calcium 07/21/2019 9.2  8.9 - 10.3 mg/dL Final   Total Protein 07/21/2019 6.6  6.5 - 8.1 g/dL Final   Albumin 07/21/2019 4.1  3.5 - 5.0 g/dL Final   AST 07/21/2019 15  15 - 41 U/L Final   ALT 07/21/2019 19  0 - 44 U/L Final   Alkaline Phosphatase 07/21/2019 80  38 - 126 U/L Final   Total Bilirubin 07/21/2019 0.2* 0.3 - 1.2 mg/dL Final   GFR, Est Non Af Am 07/21/2019 >60  >60 mL/min Final   GFR, Est AFR Am 07/21/2019 >60  >60 mL/min Final   Anion gap 07/21/2019 9  5 - 15 Final   Performed at [facility], [place] [address] [place], [place] [address]   WBC Count 07/21/2019 11.1* 4.0 - 10.5 K/uL Final   RBC 07/21/2019 4.14  3.87 - 5.11 MIL/uL Final   Hemoglobin 07/21/2019 12.7  12.0 - 15.0 g/dL Final   HCT 07/21/2019 39.6  36.0 - 46.0 % Final   MCV 07/21/2019 95.7  80.0 - 100.0 fL Final   MCH 07/21/2019 30.7  26.0 - 34.0 pg Final    MCHC 07/21/2019 32.1  30.0 - 36.0 g/dL Final   RDW 07/21/2019 13.2  11.5 - 15.5 % Final   Platelet Count 07/21/2019 251  150 - 400 K/uL Final   nRBC 07/21/2019 0.0  0.0 - 0.2 % Final   Neutrophils Relative % 07/21/2019 59  % Final   Neutro Abs 07/21/2019 6.6  1.7 - 7.7 K/uL Final   Lymphocytes Relative 07/21/2019 27  % Final   Lymphs Abs 07/21/2019 3.0  0.7 - 4.0 K/uL Final   Monocytes Relative 07/21/2019 9  % Final   Monocytes Absolute 07/21/2019 1.0  0.1 - 1.0 K/uL Final   Eosinophils Relative 07/21/2019 4  % Final   Eosinophils Absolute 07/21/2019 0.4  0.0 - 0.5 K/uL Final   Basophils Relative 07/21/2019 1  % Final   Basophils Absolute 07/21/2019 0.1  0.0 - 0.1 K/uL Final   Immature Granulocytes 07/21/2019 0  % Final   Abs Immature Granulocytes 07/21/2019 0.02  0.00 - 0.07 K/uL Final   Performed at [facility], [place] [address] [place], [place] [address]    (this displays the last labs from the last 3 days)  No results found for: TOTALPROTELP, ALBUMINELP, A1GS, A2GS, BETS, BETA2SER, GAMS, MSPIKE, SPEI (this displays SPEP labs)  No results found for: KPAFRELGTCHN, LAMBDASER, KAPLAMBRATIO (kappa/lambda light chains)  No results found for: HGBA, HGBA2QUANT, HGBFQUANT, HGBSQUAN (Hemoglobinopathy evaluation)   No results found for: LDH  No results found for: IRON, TIBC, IRONPCTSAT (Iron and TIBC)  No results found  for: FERRITIN  Urinalysis    Component Value Date/Time   COLORURINE YELLOW 07/02/2018 [place] 07/02/2018 1752   LABSPEC 1.021 07/02/2018 1752   PHURINE 5.0 07/02/2018 1752   GLUCOSEU NEGATIVE 07/02/2018 1752   HGBUR NEGATIVE 07/02/2018 1752   BILIRUBINUR NEGATIVE 07/02/2018 1752   KETONESUR 20 (A) 07/02/2018 1752   PROTEINUR NEGATIVE 07/02/2018 1752   UROBILINOGEN 1.0 02/20/2012 0927   NITRITE NEGATIVE 07/02/2018 1752   LEUKOCYTESUR NEGATIVE 07/02/2018 1752     STUDIES: Mm Clip Placement  Left  Result Date: 06/22/2019 CLINICAL DATA:  Irregular mass in the left breast status post ultrasound-guided biopsy. EXAM: DIAGNOSTIC LEFT MAMMOGRAM POST ULTRASOUND BIOPSY COMPARISON:  Previous exam(s). FINDINGS: Mammographic images were obtained following ultrasound guided biopsy of an irregular mass in the left breast at 12 o'clock. Mammograms demonstrate a ribbon shaped biopsy marking clip appropriately positioned at the site of biopsy. IMPRESSION: Appropriate positioning of a ribbon shaped biopsy marking clip at the site of biopsy in the left breast. Final Assessment: Post Procedure Mammograms for Marker Placement Electronically Signed   By: Zerita Boers M.D.   On: 06/22/2019 15:40   [place] Lt Breast Bx W Loc Dev 1st Lesion Img Bx Spec US Guide  Addendum Date: 06/23/2019   ADDENDUM REPORT: 06/23/2019 11:03 ADDENDUM: Pathology revealed GRADE III INVASIVE DUCTAL CARCINOMA of the Left breast, 12 o'clock. This was found to be concordant by Dr. Zerita Boers. Pathology results were discussed with the patient by telephone. The patient reported doing well after the biopsy with tenderness at the site. Post biopsy instructions and care were reviewed and questions were answered. The patient was encouraged to call [facility] for any additional concerns. Surgical consultation has been arranged with Dr. Erroll Luna at [facility] on July 01, 2019. Pathology results reported by Terie Purser, RN on 06/23/2019. Electronically Signed   By: Zerita Boers M.D.   On: 06/23/2019 11:03   Result Date: 06/23/2019 CLINICAL DATA:  Irregular mass in the left breast seen on screening mammogram. EXAM: ULTRASOUND GUIDED LEFT BREAST CORE NEEDLE BIOPSY COMPARISON:  Previous exam(s). FINDINGS: I met with the patient and we discussed the procedure of ultrasound-guided biopsy, including benefits and alternatives. We discussed the high likelihood of a successful procedure. We discussed the risks of  the procedure, including infection, bleeding, tissue injury, clip migration, and inadequate sampling. Informed written consent was given. The usual time-out protocol was performed immediately prior to the procedure. Lesion quadrant: Upper inner quadrant Using sterile technique and 1% Lidocaine as local anesthetic, under direct ultrasound visualization, a 12 gauge spring-loaded device was used to perform biopsy of an irregular mass at the 12 o'clock position in the left breast using a lateral approach. At the conclusion of the procedure a ribbon shaped tissue marker clip was deployed into the biopsy cavity. Follow up 2 view mammogram was performed and dictated separately. IMPRESSION: Ultrasound guided biopsy of an irregular mass in the left breast. No apparent complications. Electronically Signed: By: Zerita Boers M.D. On: 06/22/2019 15:42    ELIGIBLE FOR AVAILABLE RESEARCH PROTOCOL: no  ASSESSMENT: 84 y.o. [employer], [place] woman status post left breast upper outer quadrant biopsy 06/22/2019 for a clinical T1b N0, stage IB invasive ductal carcinoma, grade 3, triple negative, with an MIB-1-1 of 30%.  (1) definitive surgery pending  (2) likely no adjuvant chemotherapy  (3) adjuvant radiation as appropriate  PLAN: I spent approximately 60 minutes face to face with Alisha [name] with  more than 50% of that time spent in counseling and coordination of care. Specifically we reviewed the biology of the patient's diagnosis and the specifics of her situation.  We first reviewed the fact that cancer is not one disease but more than 100 different diseases and that it is important to keep them separate-- otherwise when friends and relatives discuss their own cancer experiences with Alisha [name] confusion can result. Similarly we explained that if breast cancer spreads to the bone or liver, the patient would not have bone cancer or liver cancer, but breast cancer in the bone and breast cancer in the liver: one cancer in three  places-- not 3 different cancers which otherwise would have to be treated in 3 different ways.  We discussed the difference between local and systemic therapy. In terms of loco-regional treatment, lumpectomy plus radiation is equivalent to mastectomy as far as survival is concerned. For this reason, and because the cosmetic results are generally superior, we recommend breast conserving surgery.   We then discussed the rationale for systemic therapy. There is some risk that this cancer may have already spread to other parts of her body. Patients frequently ask at this point about bone scans, CAT scans and PET scans to find out if they have occult breast cancer somewhere else. The problem is that in early stage disease we are much more likely to find false positives then true cancers and this would expose the patient to unnecessary procedures as well as unnecessary radiation. Scans cannot answer the question the patient really would like to know, which is whether she has microscopic disease elsewhere in her body. For those reasons we do not recommend them.  Of course we would proceed to aggressive evaluation of any symptoms that might suggest metastatic disease, but that is not the case here.  Next we went over the options for systemic therapy which are anti-estrogens, anti-HER-2 immunotherapy, and chemotherapy. Alisha [name] does not meet criteria for anti-HER-2 immunotherapy or anti-estrogens.  In triple negative breast cancer cases the only systemic treatment available currently is chemotherapy  The benefit of chemotherapy in triple negative breast cancer cases varies with risk.  In general chemotherapy lowers risk of recurrence by one third.  And tumors that are 5 mm or less in node-negative, the risk is so small but no chemotherapy is suggested.  In very large tumors, with multiple lymph nodes, the risk is sufficiently great that chemotherapy is strongly encouraged.  Alisha [name] risk is certainly towards  the lower end.  We can give her a better estimate when we have the final pathology.  Certainly if she did have a positive lymph node I would recommend chemotherapy, likely CMF for a minimum of 6 cycles.  If the lymph nodes are negative and the tumor is under a centimeter I would think the benefit of chemotherapy would be very small, likely less than 5%, and I would recommend against it.  She has a good understanding of this calculation and she will return to see me in about 3 weeks to make a definitive decision.  Today I gave her a copy of the healthcare power of attorney for her to complete at her discretion.  She is planning to name her grandson as her healthcare power of attorney.  Alisha [name] has a good understanding of the overall plan. She agrees with it. She knows the goal of treatment in her case is cure. She will call with any problems that may develop before her next visit here.  Alisha Cruel, MD   07/21/2019 5:12 PM Medical Oncology and Hematology [facility] [place], [place] [address] Tel. [phone]    Fax. [phone]   This document serves as a record of services personally performed by Lurline Del, MD. It was created on his behalf by Wilburn Mylar, a trained medical scribe. The creation of this record is based on the scribe's personal observations and the provider's statements to them.   I, Lurline Del MD, have reviewed the above documentation for accuracy and completeness, and I agree with the above.

## 2019-07-20 NOTE — Patient Instructions (Addendum)
Medication Instructions:  STOP DILTIAZEM 60 MG  START OLMESARTAN ( BENICAR)  20 MG ONE TABLET DAILY     If you need a refill on your cardiac medications before your next appointment, please call your pharmacy.   Lab work:  NOT NEEDED  Testing/Procedures: NOT NEEDED  Follow-Up: At [facility], you and your health needs are our priority.  As part of our continuing mission to provide you with exceptional heart care, we have created designated Provider Care Teams.  These Care Teams include your primary Cardiologist (physician) and Advanced Practice Providers (APPs -  Physician Assistants and Nurse Practitioners) who all work together to provide you with the care you need, when you need it. . You will need a follow up appointment in  6  months.  Please call our office 2 months in advance to schedule this appointment.  You may see Glenetta Hew, MD or one of the following Advanced Practice Providers on your designated Care Team:   . Rosaria Ferries, PA-C . Jory Sims, DNP, ANP  Any Other Special Instructions Will Be Listed Below (If Applicable).  YOU HAVE CLEARANCE FOR BREAST SURGERY FROM A CARDIAC STANDPOINT.

## 2019-07-20 NOTE — Progress Notes (Signed)
PCP: [name], MD  Clinic Note: Chief Complaint  Patient presents with  . Follow-up    Test results  . Pre-op Exam  . Hypertension    HPI: Alisha [name] is a 84 y.o. female who is being seen today follow-up study results for preoperative evaluation.  Was seen in July for prolonged fatigue at the request of [name], MD  Alisha [name] was referred by her PCP to establish cardiology care.  She has been a patient of Dr. Wynonia Lawman until his retirement.  I saw her for evaluation of fatigue and lack of stamina.  She had gained about 40 pounds in the past 3 years, indicating that she simply stop doing things because of dyspnea and lack of energy.  For preop we ordered a coronary CT angiogram and event monitor to evaluate bradycardia)  Recent Hospitalizations: n/a  Studies Personally Reviewed - (if available, images/films reviewed: From Epic Chart or Care Everywhere)  14-day ZIO patch monitor July 2020: Several short bursts of PAT as well as occasional sinus bradycardia (mostly during sleeping hours).  One was midday.  Second half of the duration she was off of diltiazem and noted better heart rate at baseline, less bradycardia and more heart responsiveness..  Fastest sinus rhythm rate was 88 bpm.  CORONARY CALCIUM SCORE-CT ANGIOGRAM 05/12/2019: Small, < 25mm nodule noted.  Difficult study to read.  Coronary calcium score was 11.  Significant motion artifact, but visible territory showed minimal disease.  Minimal calcium noted in the ostial LAD but no suggestion of significant CAD.  Interval History: Alisha [name] presents here for /fu of tests - as part of Pre-op evaluation for Breast Ca Sgx & fatigue - lack of stamina.  She was very worried because the results that she heard about the CT scan was that it was not readable, and she did not recall getting any report of her monitor results.  She still notes lack of get up and go feeling tired, but denies any exertional  dyspnea.  No PND, orthopnea or edema.  No chest pain or pressure with rest or exertion.  No sensation of irregular rapid heartbeats or palpitations.  No syncope/near syncope or TIA/amaurosis fugax.  No claudication.  She does note off-and-on swelling which is usually worse in the morning and better during the day.  ROS: A comprehensive was performed. Review of Systems  Constitutional: Positive for malaise/fatigue. Negative for weight loss (gain).  HENT: Negative for congestion and nosebleeds.   Respiratory: Positive for shortness of breath (per HPI).   Cardiovascular: Positive for leg swelling (per HPI). Negative for claudication.  Gastrointestinal: Negative for abdominal pain, blood in stool, constipation, heartburn, melena and nausea.  Genitourinary: Negative for dysuria and hematuria.  Musculoskeletal: Positive for back pain and joint pain. Negative for falls.  Skin: Negative.   Neurological: Positive for dizziness (per HPI). Negative for tingling and headaches.  Psychiatric/Behavioral: Negative.   All other systems reviewed and are negative.   The patient does not have symptoms concerning for COVID-19 infection (fever, chills, cough, or new shortness of breath).  The patient is practicing social distancing.   COVID-19 Education: The signs and symptoms of COVID-19 were discussed with the patient and how to seek care for testing (follow up with PCP or arrange E-visit).   The importance of social distancing was discussed today.   I have reviewed and (if needed) personally updated the patient's problem list, medications, allergies, past medical and surgical history, social and family history.  Past Medical History:  Diagnosis Date  . Arthritis   . Asthma    Never smoker  . Blood transfusion    over 40 y ears  . Breast mass, left   . Cancer ([place])   . Hypertension    on meds  . Hypothyroidism    on meds  . Pre-diabetes     Past Surgical History:  Procedure Laterality  Date  . ABDOMINAL HYSTERECTOMY     32 yrs  . BREAST LUMPECTOMY WITH RADIOACTIVE SEED LOCALIZATION Left 08/11/2016   Procedure: LEFT BREAST LUMPECTOMY WITH RADIOACTIVE SEED LOCALIZATION;  Surgeon: Erroll Luna, MD;  Location: [facility];  Service: General;  Laterality: Left;  . CATARACT EXTRACTION, BILATERAL    . EYE SURGERY    . JOINT REPLACEMENT Bilateral    left knee 2012  . KNEE ARTHROPLASTY  02/17/2012   Procedure: COMPUTER ASSISTED TOTAL KNEE ARTHROPLASTY;  Surgeon: Meredith Pel, MD;  Location: [facility];  Service: Orthopedics;  Laterality: Right;  Right total knee replacement    Current Meds  Medication Sig  . acetaminophen (TYLENOL) 500 MG tablet Take 1,000 mg by mouth every 6 (six) hours as needed for moderate pain.   Marland Kitchen albuterol (PROVENTIL HFA;VENTOLIN HFA) 108 (90 Base) MCG/ACT inhaler Inhale 1-2 puffs into the lungs every 6 (six) hours as needed for wheezing or shortness of breath.   . Artificial Tear Solution (SOOTHE XP OP) Place 1 drop into both eyes 3 (three) times daily as needed (dry or irritated eyes).  Marland Kitchen atenolol (TENORMIN) 25 MG tablet Take 25 mg by mouth daily.  . Biotin 5 MG TABS Take 5 mg by mouth daily.   . cetaphil (CETAPHIL) cream Apply 1 application topically 2 (two) times daily.   . diphenhydrAMINE HCl (ZZZQUIL) 50 MG/30ML LIQD Take 50 mg by mouth at bedtime as needed (sleep).   . fluticasone (FLONASE) 50 MCG/ACT nasal spray Place 2 sprays into both nostrils daily as needed for allergies or rhinitis.  . furosemide (LASIX) 20 MG tablet Take 20 mg by mouth daily as needed for fluid or edema.  Marland Kitchen levothyroxine (SYNTHROID, LEVOTHROID) 88 MCG tablet Take 88 mcg by mouth daily before breakfast.   . meclizine (ANTIVERT) 25 MG tablet Take 12.5 mg by mouth 3 (three) times daily as needed for dizziness.  . Multiple Vitamins-Minerals (ICAPS AREDS 2 PO) Take 1 capsule by mouth 2 (two) times daily.   . potassium chloride SA (KLOR-CON) 20 MEQ tablet Take 20  mEq by mouth daily as needed (when taking furosemide).  . [DISCONTINUED] diltiazem (CARDIZEM) 60 MG tablet Take 30 mg by mouth daily.     Allergies  Allergen Reactions  . Lisinopril Itching    No energy  . Penicillins Itching and Rash    Has patient had a PCN reaction causing immediate rash, facial/tongue/throat swelling, SOB or lightheadedness with hypotension: No Has patient had a PCN reaction causing severe rash involving mucus membranes or skin necrosis: No Has patient had a PCN reaction that required hospitalization: No Has patient had a PCN reaction occurring within the last 10 years: No If all of the above answers are "NO", then may proceed with Cephalosporin use.  . Sulfa Antibiotics Itching and Rash    Social History   Tobacco Use  . Smoking status: Never Smoker  . Smokeless tobacco: Never Used  Substance Use Topics  . Alcohol use: No  . Drug use: No   Social History   Social History Narrative   No  longer exercising - since "got tired" - previously did elliptical, exercise bike etc.    While working - used worked gym    family history includes Diabetes in her brother and sister; Hypertension in her sister; Other in her mother.  Wt Readings from Last 3 Encounters:  07/22/19 222 lb (100.7 kg)  07/21/19 224 lb 4.8 oz (101.7 kg)  07/20/19 224 lb 9.6 oz (101.9 kg)    PHYSICAL EXAM BP (!) 170/100   Pulse 71   Temp (!) 96.8 F (36 C)   Ht 5' 4.5" (1.638 m)   Wt 224 lb 9.6 oz (101.9 kg)   SpO2 97%   BMI 37.96 kg/m  Physical Exam  Constitutional: She is oriented to person, place, and time.  HENT:  Head: Normocephalic and atraumatic.  Neck: Normal range of motion. Neck supple. No JVD present. Carotid bruit is not present.  Cardiovascular: Regular rhythm, S1 normal and S2 normal.  Occasional extrasystoles are present. Bradycardia present. PMI is not displaced. Exam reveals distant heart sounds. Exam reveals no gallop and no friction rub. Decreased pulses:  decreased due to swelling & adipose tissue.  No murmur heard. Pulmonary/Chest: Effort normal and breath sounds normal. No respiratory distress. She has no wheezes. She has no rales.  Abdominal: Soft. Bowel sounds are normal. She exhibits no distension. There is no abdominal tenderness. There is no rebound.  Obese  Musculoskeletal: Normal range of motion.        General: Edema (trivial) present.  Neurological: She is alert and oriented to person, place, and time. No cranial nerve deficit.  Psychiatric: She has a normal mood and affect. Her behavior is normal. Judgment and thought content normal.  Vitals reviewed.    Adult ECG Report  Rate: 48 ;  Rhythm: sinus bradycardia, sinus arrhythmia and Otherwise normal axis, intervals and durations.;   Narrative Interpretation: Relatively normal EKG with sinus bradycardia.   Other studies Reviewed: Additional studies/ records that were reviewed today include:  Recent Labs:  Not available  ASSESSMENT / PLAN:  Alisha [name] is being evaluated for significant fatigue and some exertional dyspnea.  She is noted to have bradycardia and borderline elevated blood pressures. She had previously been on atenolol and diltiazem both of which been stopped. By report, her beta-blocker was stopped, but is still listed on her medication list.  So far negative cardiac evaluation.  Relatively stable.  Avoid extra AV nodal agents.  Okay for surgery without any further evaluation.   Problem List Items Addressed This Visit    Bradycardia with 41-50 beats per minute    This seems to have improved since stopping diltiazem.  We did see a dramatic change in her lowest resting heart rates once off diltiazem. If additional blood pressure control is needed would not titrate up her existing beta-blocker or use a non-dihydropyridine calcium channel blocker such as diltiazem or verapamil.  She is still on atenolol which I would like to continue until postop.  She  does not seem to be having significant bradycardia symptoms.  We could consider evaluating for chronotropic incompetence, however she is having short little bursts of PAT on monitor and therefore I would like to keep her on beta-blocker perioperatively.      DOE (dyspnea on exertion)    Likely not related to heart artery disease.  I suspect is probably more related to fatigue.  In the future, can consider holding beta-blocker but we are continuing till postop.      Preoperative cardiovascular  examination - Primary    Pretty good news with minimal disease noted on coronary calcium score no significant arrhythmias noted on monitor. She not really having any heart failure symptoms to suggest a need for preop echo.  She does not have CHF or any active CAD findings.  Has normal creatinine level and is only borderline diabetic with no insulin requirement.  She has been pending low risk surgery.  Based on revised cardiac risk index, would be considered low risk patient for low risk surgery.  No further cardiac evaluation required.           I spent a total of 25 minutes with the patient and chart review. >  50% of the time was spent in direct patient consultation.   Current medicines are reviewed at length with the patient today.  (+/- concerns) n/a The following changes have been made:  see below   Patient Instructions  Medication Instructions:  STOP DILTIAZEM 60 MG  START OLMESARTAN ( BENICAR)  20 MG ONE TABLET DAILY     If you need a refill on your cardiac medications before your next appointment, please call your pharmacy.   Lab work:  NOT NEEDED  Testing/Procedures: NOT NEEDED  Follow-Up: At [facility], you and your health needs are our priority.  As part of our continuing mission to provide you with exceptional heart care, we have created designated Provider Care Teams.  These Care Teams include your primary Cardiologist (physician) and Advanced Practice Providers  (APPs -  Physician Assistants and Nurse Practitioners) who all work together to provide you with the care you need, when you need it. . You will need a follow up appointment in  6  months.  Please call our office 2 months in advance to schedule this appointment.  You may see Glenetta Hew, MD or one of the following Advanced Practice Providers on your designated Care Team:   . Rosaria Ferries, PA-C . Jory Sims, DNP, ANP  Any Other Special Instructions Will Be Listed Below (If Applicable).  YOU HAVE CLEARANCE FOR BREAST SURGERY FROM A CARDIAC STANDPOINT.    Studies Ordered:   No orders of the defined types were placed in this encounter.     Glenetta Hew, M.D., M.S. Interventional Cardiologist   Pager # [phone] Phone # [phone] [address] [place], [place] [address]   Thank you for choosing Heart[facility] at [facility]!!

## 2019-07-21 ENCOUNTER — Inpatient Hospital Stay: Payer: Medicare HMO | Attending: Oncology | Admitting: Oncology

## 2019-07-21 ENCOUNTER — Inpatient Hospital Stay: Payer: Medicare HMO

## 2019-07-21 ENCOUNTER — Other Ambulatory Visit: Payer: Self-pay

## 2019-07-21 VITALS — BP 168/72 | HR 51 | Temp 98.5°F | Resp 18 | Ht 64.5 in | Wt 224.3 lb

## 2019-07-21 DIAGNOSIS — C50412 Malignant neoplasm of upper-outer quadrant of left female breast: Secondary | ICD-10-CM

## 2019-07-21 DIAGNOSIS — Z79899 Other long term (current) drug therapy: Secondary | ICD-10-CM | POA: Insufficient documentation

## 2019-07-21 DIAGNOSIS — Z171 Estrogen receptor negative status [ER-]: Secondary | ICD-10-CM | POA: Diagnosis not present

## 2019-07-21 DIAGNOSIS — M1990 Unspecified osteoarthritis, unspecified site: Secondary | ICD-10-CM | POA: Insufficient documentation

## 2019-07-21 DIAGNOSIS — E039 Hypothyroidism, unspecified: Secondary | ICD-10-CM | POA: Insufficient documentation

## 2019-07-21 DIAGNOSIS — Z90722 Acquired absence of ovaries, bilateral: Secondary | ICD-10-CM | POA: Insufficient documentation

## 2019-07-21 DIAGNOSIS — R001 Bradycardia, unspecified: Secondary | ICD-10-CM | POA: Diagnosis not present

## 2019-07-21 DIAGNOSIS — I10 Essential (primary) hypertension: Secondary | ICD-10-CM | POA: Diagnosis not present

## 2019-07-21 DIAGNOSIS — Z90710 Acquired absence of both cervix and uterus: Secondary | ICD-10-CM | POA: Insufficient documentation

## 2019-07-21 DIAGNOSIS — R7303 Prediabetes: Secondary | ICD-10-CM | POA: Insufficient documentation

## 2019-07-21 LAB — CBC WITH DIFFERENTIAL (CANCER CENTER ONLY)
Abs Immature Granulocytes: 0.02 10*3/uL (ref 0.00–0.07)
Basophils Absolute: 0.1 10*3/uL (ref 0.0–0.1)
Basophils Relative: 1 %
Eosinophils Absolute: 0.4 10*3/uL (ref 0.0–0.5)
Eosinophils Relative: 4 %
HCT: 39.6 % (ref 36.0–46.0)
Hemoglobin: 12.7 g/dL (ref 12.0–15.0)
Immature Granulocytes: 0 %
Lymphocytes Relative: 27 %
Lymphs Abs: 3 10*3/uL (ref 0.7–4.0)
MCH: 30.7 pg (ref 26.0–34.0)
MCHC: 32.1 g/dL (ref 30.0–36.0)
MCV: 95.7 fL (ref 80.0–100.0)
Monocytes Absolute: 1 10*3/uL (ref 0.1–1.0)
Monocytes Relative: 9 %
Neutro Abs: 6.6 10*3/uL (ref 1.7–7.7)
Neutrophils Relative %: 59 %
Platelet Count: 251 10*3/uL (ref 150–400)
RBC: 4.14 MIL/uL (ref 3.87–5.11)
RDW: 13.2 % (ref 11.5–15.5)
WBC Count: 11.1 10*3/uL — ABNORMAL HIGH (ref 4.0–10.5)
nRBC: 0 % (ref 0.0–0.2)

## 2019-07-21 LAB — CMP (CANCER CENTER ONLY)
ALT: 19 U/L (ref 0–44)
AST: 15 U/L (ref 15–41)
Albumin: 4.1 g/dL (ref 3.5–5.0)
Alkaline Phosphatase: 80 U/L (ref 38–126)
Anion gap: 9 (ref 5–15)
BUN: 14 mg/dL (ref 8–23)
CO2: 25 mmol/L (ref 22–32)
Calcium: 9.2 mg/dL (ref 8.9–10.3)
Chloride: 107 mmol/L (ref 98–111)
Creatinine: 0.78 mg/dL (ref 0.44–1.00)
GFR, Est AFR Am: >60 mL/min (ref >60)
GFR, Estimated: >60 mL/min (ref >60)
Glucose, Bld: 124 mg/dL — ABNORMAL HIGH (ref 70–99)
Potassium: 4.4 mmol/L (ref 3.5–5.1)
Sodium: 141 mmol/L (ref 135–145)
Total Bilirubin: 0.2 mg/dL — ABNORMAL LOW (ref 0.3–1.2)
Total Protein: 6.6 g/dL (ref 6.5–8.1)

## 2019-07-21 NOTE — Progress Notes (Addendum)
[facility], [place] [address] Phone: [phone] Fax: [facility] [address] [place] [address] Phone: [phone] Fax: [phone]      Your procedure is scheduled on July 27, 2019.  Report to [facility] Entr[facility] "A" at 1:00 P.M., and check in at the Admitting office.  Call this number if you have problems the morning of surgery:  [phone]  Call [phone] if you have any questions prior to your surgery date Monday-Friday 8am-4pm    Remember:  Do not eat after midnight the night before your surgery  You may drink clear liquids until 12:00 the day of your surgery.   Clear liquids allowed are: Water, Non-Citrus Juices (without pulp), Carbonated Beverages, Clear Tea, Black Coffee Only, and Gatorade  Please complete your PRE-SURGERY Gatorade that was provided to you by 12:00 P.M.  the day of surgery.  Please, if able, drink it in one setting. DO NOT SIP.    Take these medicines the morning of surgery with A SIP OF WATER:  atenolol (TENORMIN) levothyroxine (SYNTHROID, LEVOTHROID) acetaminophen (TYLENOL)-if needed Artificial Tear Solution-if needed fluticasone (FLONASE)-if needed meclizine (ANTIVERT)-if needed  As of today, STOP taking any Aspirin (unless otherwise instructed by your surgeon), Aleve, Naproxen, Ibuprofen, Motrin, Advil, Goody's, BC's, all herbal medications, fish oil, and all vitamins.    The Morning of Surgery  Do not wear jewelry, make-up or nail polish.  Do not wear lotions, powders, or perfumes, or deodorant  Do not shave 48 hours prior to surgery.    Do not bring valuables to the hospital.  [facility] is not responsible for any belongings or valuables.  If you are a smoker, DO NOT Smoke 24 hours prior to surgery IF you wear a CPAP at night  please bring your mask, tubing, and machine the morning of surgery   Remember that you must have someone to transport you home after your surgery, and remain with you for 24 hours if you are discharged the same day.   Contacts, glasses, hearing aids, dentures or bridgework may not be worn into surgery.    Leave your suitcase in the car.  After surgery it may be brought to your room.  For patients admitted to the hospital, discharge time will be determined by your treatment team.  Patients discharged the day of surgery will not be allowed to drive home.    Special instructions:   [facility]- Preparing For Surgery  Before surgery, you can play an important role. Because skin is not sterile, your skin needs to be as free of germs as possible. You can reduce the number of germs on your skin by washing with CHG (chlorahexidine gluconate) Soap before surgery.  CHG is an antiseptic cleaner which kills germs and bonds with the skin to continue killing germs even after washing.    Oral Hygiene is also important to reduce your risk of infection.  Remember - BRUSH YOUR TEETH THE MORNING OF SURGERY WITH YOUR REGULAR TOOTHPASTE  Please do not use if you have an allergy to CHG or antibacterial soaps. If your skin becomes reddened/irritated stop using the CHG.  Do not shave (including legs and underarms) for at least 48 hours prior to first CHG shower. It is OK to shave your face.  Please follow these instructions  carefully.   1. Shower the NIGHT BEFORE SURGERY and the MORNING OF SURGERY with CHG Soap.   2. If you chose to wash your hair, wash your hair first as usual with your normal shampoo.  3. After you shampoo, rinse your hair and body thoroughly to remove the shampoo.  4. Use CHG as you would any other liquid soap. You can apply CHG directly to the skin and wash gently with a scrungie or a clean washcloth.   5. Apply the CHG Soap to your body ONLY FROM THE NECK DOWN.  Do not use on open  wounds or open sores. Avoid contact with your eyes, ears, mouth and genitals (private parts). Wash Face and genitals (private parts)  with your normal soap.   6. Wash thoroughly, paying special attention to the area where your surgery will be performed.  7. Thoroughly rinse your body with warm water from the neck down.  8. DO NOT shower/wash with your normal soap after using and rinsing off the CHG Soap.  9. Pat yourself dry with a CLEAN TOWEL.  10. Wear CLEAN PAJAMAS to bed the night before surgery, wear comfortable clothes the morning of surgery  11. Place CLEAN SHEETS on your bed the night of your first shower and DO NOT SLEEP WITH PETS.    Day of Surgery:  Do not apply any deodorants/lotions. Please shower the morning of surgery with the CHG soap  Please wear clean clothes to the hospital/surgery center.   Remember to brush your teeth WITH YOUR REGULAR TOOTHPASTE.   Please read over the following fact sheets that you were given.

## 2019-07-22 ENCOUNTER — Telehealth: Payer: Self-pay | Admitting: Oncology

## 2019-07-22 ENCOUNTER — Encounter (HOSPITAL_COMMUNITY): Payer: Self-pay

## 2019-07-22 ENCOUNTER — Encounter (HOSPITAL_COMMUNITY)
Admission: RE | Admit: 2019-07-22 | Discharge: 2019-07-22 | Disposition: A | Discharge status: Home / Self Care | Payer: Medicare HMO | Source: Ambulatory Visit | Attending: Surgery | Admitting: Surgery

## 2019-07-22 ENCOUNTER — Other Ambulatory Visit: Payer: Self-pay

## 2019-07-22 ENCOUNTER — Encounter: Payer: Self-pay | Admitting: *Deleted

## 2019-07-22 DIAGNOSIS — Z01812 Encounter for preprocedural laboratory examination: Secondary | ICD-10-CM | POA: Insufficient documentation

## 2019-07-22 DIAGNOSIS — C50912 Malignant neoplasm of unspecified site of left female breast: Secondary | ICD-10-CM | POA: Insufficient documentation

## 2019-07-22 HISTORY — DX: Malignant (primary) neoplasm, unspecified: C80.1

## 2019-07-22 HISTORY — DX: Prediabetes: R73.03

## 2019-07-22 LAB — GLUCOSE, CAPILLARY: Glucose-Capillary: 112 mg/dL — ABNORMAL HIGH (ref 70–99)

## 2019-07-22 NOTE — Telephone Encounter (Signed)
I could not reach regarding schedule  °

## 2019-07-22 NOTE — Progress Notes (Signed)
PCP:  Dr. [name] Cardiologist:  Dr. Glenetta Hew (was Dr. Wynonia Lawman)  EKG:  04/21/19 CXR:  denies ECHO:  Patient states was done by [name] in 7/20.  Records and cardiac studies requested.  Stress Test:  Denies Cardiac Cath: Denies  Patient is pre-diabetic. Does not have a glucose meter and does not test BG.   Gave Gatorade to drink day of surgery.  Labs were drawn yesterday, 07/21/19, at [facility]  Covid test 07/23/19  Anesthesia Review:  Yes, cardiac history  Patient denies shortness of breath, fever, cough, and chest pain at PAT appointment.  Patient verbalized understanding of instructions provided today at the PAT appointment.  Patient asked to review instructions at home and day of surgery.

## 2019-07-23 ENCOUNTER — Other Ambulatory Visit (HOSPITAL_COMMUNITY)
Admission: RE | Admit: 2019-07-23 | Discharge: 2019-07-23 | Disposition: A | Discharge status: Home / Self Care | Payer: Medicare HMO | Source: Ambulatory Visit | Attending: Surgery | Admitting: Surgery

## 2019-07-23 DIAGNOSIS — Z20828 Contact with and (suspected) exposure to other viral communicable diseases: Secondary | ICD-10-CM | POA: Insufficient documentation

## 2019-07-23 DIAGNOSIS — Z01812 Encounter for preprocedural laboratory examination: Secondary | ICD-10-CM | POA: Diagnosis not present

## 2019-07-24 ENCOUNTER — Encounter: Payer: Self-pay | Admitting: Cardiology

## 2019-07-24 NOTE — Assessment & Plan Note (Addendum)
Pretty good news with minimal disease noted on coronary calcium score no significant arrhythmias noted on monitor. She not really having any heart failure symptoms to suggest a need for preop echo.  She does not have CHF or any active CAD findings.  Has normal creatinine level and is only borderline diabetic with no insulin requirement.  She has been pending low risk surgery.  Based on revised cardiac risk index, would be considered low risk patient for low risk surgery.  No further cardiac evaluation required.

## 2019-07-24 NOTE — Assessment & Plan Note (Signed)
Likely not related to heart artery disease.  I suspect is probably more related to fatigue.  In the future, can consider holding beta-blocker but we are continuing till postop.

## 2019-07-24 NOTE — Assessment & Plan Note (Signed)
This seems to have improved since stopping diltiazem.  We did see a dramatic change in her lowest resting heart rates once off diltiazem. If additional blood pressure control is needed would not titrate up her existing beta-blocker or use a non-dihydropyridine calcium channel blocker such as diltiazem or verapamil.  She is still on atenolol which I would like to continue until postop.  She does not seem to be having significant bradycardia symptoms.  We could consider evaluating for chronotropic incompetence, however she is having short little bursts of PAT on monitor and therefore I would like to keep her on beta-blocker perioperatively.

## 2019-07-25 LAB — NOVEL CORONAVIRUS, NAA (HOSP ORDER, SEND-OUT TO REF LAB; TAT 18-24 HRS): SARS-CoV-2, NAA: NOT DETECTED

## 2019-07-25 NOTE — Anesthesia Preprocedure Evaluation (Addendum)
Anesthesia Evaluation  Patient identified by MRN, date of birth, ID band Patient awake    Reviewed: Allergy & Precautions, NPO status , Patient's Chart, lab work & pertinent test results  Airway Mallampati: II  TM Distance: >3 FB Neck ROM: Full    Dental no notable dental hx. (+) Missing, Dental Advisory Given, Poor Dentition,    Pulmonary asthma ,    Pulmonary exam normal breath sounds clear to auscultation       Cardiovascular hypertension, negative cardio ROS Normal cardiovascular exam Rhythm:Regular Rate:Normal  EKG SR with rate 40s   Neuro/Psych negative neurological ROS  negative psych ROS   GI/Hepatic negative GI ROS, Neg liver ROS,   Endo/Other  Hypothyroidism   Renal/GU negative Renal ROS  negative genitourinary   Musculoskeletal  (+) Arthritis ,   Abdominal   Peds  Hematology negative hematology ROS (+)   Anesthesia Other Findings Left breast cancer  Reproductive/Obstetrics                          Anesthesia Physical Anesthesia Plan  ASA: II  Anesthesia Plan: General and Regional   Post-op Pain Management:  Regional for Post-op pain   Induction: Intravenous  PONV Risk Score and Plan: 3 and Ondansetron and Dexamethasone  Airway Management Planned: LMA  Additional Equipment:   Intra-op Plan:   Post-operative Plan: Extubation in OR  Informed Consent: I have reviewed the patients History and Physical, chart, labs and discussed the procedure including the risks, benefits and alternatives for the proposed anesthesia with the patient or authorized representative who has indicated his/her understanding and acceptance.     Dental advisory given  Plan Discussed with: CRNA  Anesthesia Plan Comments: (Preop eval/clearance by [name] 07/20/19: "Pretty good news with minimal disease noted on coronary calcium score no significant arrhythmias noted on monitor. She not really  having any heart failure symptoms to suggest a need for preop echo. She does not have CHF or any active CAD findings.  Has normal creatinine level and is only borderline diabetic with no insulin requirement. She has been pending low risk surgery.  Based on revised cardiac risk index, would be considered low risk patient for low risk surgery.  No further cardiac evaluation required."  Coronary CT 05/12/19: IMPRESSION: 1.  Calcium score only 11 which is 28 th percentile for age and sex 2.  Normal aortic root 3.1 cm 3. Non diagnostic study do to severe motion artifact. Portion of the ostial RCA and distal LAD not well visualized. However study would be considered low risk due to minimum calcium (only on non obstructive foci in ostial LAD. And no disease in remainder of visualized segments   Event monitor 05/04/19:  Predominant rhythm is sinus with a minimum heart rate of 36 bpm, maximum heart rate 88 bpm.  7 runs of PAT/PSVT noted 6-8 beats. Fastest heart rate 162 bpm.  Rare PACs with minimal PVCs noted.  Patient noted lightheadedness with rates ranging from 49 to 61 bpm in sinus rhythm/sinus bradycardia.  Slowest heart rate noted 36 bpm at 5:40 PM. 38 bpm at 11:36 AM  Based on these findings, would appear that the slowest heart rate episodes were all noted while she was still taking diltiazem according to plan.  Further bradycardia episodes were not recorded once she was no longer taking diltiazem  Although there were SVT/PAT runs, would recommend continue to hold AV nodal agents to avoid worsening bradycardia. )  Anesthesia Quick Evaluation

## 2019-07-27 ENCOUNTER — Ambulatory Visit (HOSPITAL_COMMUNITY): Payer: Medicare HMO | Admitting: Certified Registered Nurse Anesthetist

## 2019-07-27 ENCOUNTER — Ambulatory Visit (HOSPITAL_COMMUNITY)
Admission: RE | Admit: 2019-07-27 | Discharge: 2019-07-27 | Disposition: A | Discharge status: Home / Self Care | Payer: Medicare HMO | Source: Ambulatory Visit | Attending: Surgery | Admitting: Surgery

## 2019-07-27 ENCOUNTER — Encounter (HOSPITAL_COMMUNITY): Payer: Self-pay | Admitting: Certified Registered Nurse Anesthetist

## 2019-07-27 ENCOUNTER — Ambulatory Visit
Admission: RE | Admit: 2019-07-27 | Discharge: 2019-07-27 | Disposition: A | Discharge status: Home / Self Care | Payer: Medicare HMO | Source: Ambulatory Visit | Attending: Surgery | Admitting: Surgery

## 2019-07-27 ENCOUNTER — Ambulatory Visit (HOSPITAL_COMMUNITY): Payer: Medicare HMO | Admitting: Physician Assistant

## 2019-07-27 ENCOUNTER — Ambulatory Visit (HOSPITAL_COMMUNITY)
Admission: RE | Admit: 2019-07-27 | Discharge: 2019-07-27 | Disposition: A | Discharge status: Home / Self Care | Payer: Medicare HMO | Attending: Surgery | Admitting: Surgery

## 2019-07-27 ENCOUNTER — Other Ambulatory Visit: Payer: Self-pay

## 2019-07-27 ENCOUNTER — Encounter (HOSPITAL_COMMUNITY)
Admission: RE | Disposition: A | Discharge status: Home / Self Care | Payer: Self-pay | Source: Home / Self Care | Attending: Surgery

## 2019-07-27 DIAGNOSIS — E039 Hypothyroidism, unspecified: Secondary | ICD-10-CM | POA: Diagnosis not present

## 2019-07-27 DIAGNOSIS — R920 Mammographic microcalcification found on diagnostic imaging of breast: Secondary | ICD-10-CM | POA: Diagnosis not present

## 2019-07-27 DIAGNOSIS — I10 Essential (primary) hypertension: Secondary | ICD-10-CM | POA: Insufficient documentation

## 2019-07-27 DIAGNOSIS — C50912 Malignant neoplasm of unspecified site of left female breast: Secondary | ICD-10-CM | POA: Insufficient documentation

## 2019-07-27 DIAGNOSIS — G8918 Other acute postprocedural pain: Secondary | ICD-10-CM | POA: Diagnosis not present

## 2019-07-27 DIAGNOSIS — Z79890 Hormone replacement therapy (postmenopausal): Secondary | ICD-10-CM | POA: Insufficient documentation

## 2019-07-27 DIAGNOSIS — J45909 Unspecified asthma, uncomplicated: Secondary | ICD-10-CM | POA: Diagnosis not present

## 2019-07-27 DIAGNOSIS — Z79899 Other long term (current) drug therapy: Secondary | ICD-10-CM | POA: Insufficient documentation

## 2019-07-27 DIAGNOSIS — Z171 Estrogen receptor negative status [ER-]: Secondary | ICD-10-CM

## 2019-07-27 DIAGNOSIS — C50212 Malignant neoplasm of upper-inner quadrant of left female breast: Secondary | ICD-10-CM | POA: Diagnosis not present

## 2019-07-27 DIAGNOSIS — M1990 Unspecified osteoarthritis, unspecified site: Secondary | ICD-10-CM | POA: Diagnosis not present

## 2019-07-27 DIAGNOSIS — Z170 Estrogen receptor positive status [ER+]: Secondary | ICD-10-CM | POA: Diagnosis not present

## 2019-07-27 DIAGNOSIS — C50412 Malignant neoplasm of upper-outer quadrant of left female breast: Secondary | ICD-10-CM | POA: Diagnosis not present

## 2019-07-27 DIAGNOSIS — Z7951 Long term (current) use of inhaled steroids: Secondary | ICD-10-CM | POA: Diagnosis not present

## 2019-07-27 HISTORY — PX: BREAST LUMPECTOMY WITH RADIOACTIVE SEED AND SENTINEL LYMPH NODE BIOPSY: SHX6550

## 2019-07-27 HISTORY — PX: BREAST LUMPECTOMY: SHX2

## 2019-07-27 LAB — GLUCOSE, CAPILLARY
Glucose-Capillary: 90 mg/dL (ref 70–99)
Glucose-Capillary: 68 mg/dL — ABNORMAL LOW (ref 70–99)

## 2019-07-27 SURGERY — BREAST LUMPECTOMY WITH RADIOACTIVE SEED AND SENTINEL LYMPH NODE BIOPSY
Anesthesia: Regional | Site: Breast | Laterality: Left

## 2019-07-27 MED ORDER — ONDANSETRON HCL 4 MG/2ML IJ SOLN
INTRAMUSCULAR | Status: AC
  Filled 2019-07-27: qty 2

## 2019-07-27 MED ORDER — BUPIVACAINE-EPINEPHRINE 0.25% -1:200000 IJ SOLN
INTRAMUSCULAR | Status: AC
  Filled 2019-07-27: qty 1

## 2019-07-27 MED ORDER — TECHNETIUM TC 99M SULFUR COLLOID FILTERED
1.0000 | Freq: Once | INTRAVENOUS | Status: AC | PRN
  Administered 2019-07-27: 1 via INTRADERMAL

## 2019-07-27 MED ORDER — DEXAMETHASONE SODIUM PHOSPHATE 10 MG/ML IJ SOLN
INTRAMUSCULAR | Status: AC
  Filled 2019-07-27: qty 1

## 2019-07-27 MED ORDER — FENTANYL CITRATE (PF) 100 MCG/2ML IJ SOLN: 25.0000 ug | INTRAMUSCULAR | Status: DC | PRN

## 2019-07-27 MED ORDER — EPHEDRINE SULFATE-NACL 50-0.9 MG/10ML-% IV SOSY
PREFILLED_SYRINGE | INTRAVENOUS | Status: DC | PRN
  Administered 2019-07-27 (×3): 5 mg via INTRAVENOUS

## 2019-07-27 MED ORDER — ONDANSETRON HCL 4 MG/2ML IJ SOLN
INTRAMUSCULAR | Status: DC | PRN
  Administered 2019-07-27: 4 mg via INTRAVENOUS

## 2019-07-27 MED ORDER — EPHEDRINE 5 MG/ML INJ
INTRAVENOUS | Status: AC
  Filled 2019-07-27: qty 10

## 2019-07-27 MED ORDER — PROPOFOL 10 MG/ML IV BOLUS
INTRAVENOUS | Status: DC | PRN
  Administered 2019-07-27: 50 mg via INTRAVENOUS
  Administered 2019-07-27: 150 mg via INTRAVENOUS
  Administered 2019-07-27: 50 mg via INTRAVENOUS

## 2019-07-27 MED ORDER — FENTANYL CITRATE (PF) 100 MCG/2ML IJ SOLN
100.0000 ug | Freq: Once | INTRAMUSCULAR | Status: AC
  Administered 2019-07-27: 100 ug via INTRAVENOUS
  Filled 2019-07-27: qty 2

## 2019-07-27 MED ORDER — METHYLENE BLUE 0.5 % INJ SOLN
INTRAVENOUS | Status: AC
  Filled 2019-07-27: qty 10

## 2019-07-27 MED ORDER — CHLORHEXIDINE GLUCONATE CLOTH 2 % EX PADS: 6.0000 | MEDICATED_PAD | Freq: Once | CUTANEOUS | Status: DC

## 2019-07-27 MED ORDER — CLINDAMYCIN PHOSPHATE 900 MG/50ML IV SOLN
900.0000 mg | INTRAVENOUS | Status: AC
  Administered 2019-07-27: 900 mg via INTRAVENOUS
  Filled 2019-07-27: qty 50

## 2019-07-27 MED ORDER — FENTANYL CITRATE (PF) 100 MCG/2ML IJ SOLN
INTRAMUSCULAR | Status: DC | PRN
  Administered 2019-07-27 (×4): 25 ug via INTRAVENOUS

## 2019-07-27 MED ORDER — DEXAMETHASONE SODIUM PHOSPHATE 10 MG/ML IJ SOLN
INTRAMUSCULAR | Status: DC | PRN
  Administered 2019-07-27: 10 mg via INTRAVENOUS
  Administered 2019-07-27: 5 mg

## 2019-07-27 MED ORDER — HYDROCODONE-ACETAMINOPHEN 5-325 MG PO TABS: 1.0000 | ORAL_TABLET | Freq: Four times a day (QID) | ORAL | 0 refills | Status: DC | PRN

## 2019-07-27 MED ORDER — MIDAZOLAM HCL 2 MG/2ML IJ SOLN
INTRAMUSCULAR | Status: AC
  Filled 2019-07-27: qty 2

## 2019-07-27 MED ORDER — PROPOFOL 10 MG/ML IV BOLUS
INTRAVENOUS | Status: AC
  Filled 2019-07-27: qty 20

## 2019-07-27 MED ORDER — ACETAMINOPHEN 500 MG PO TABS: 1000.0000 mg | ORAL_TABLET | Freq: Once | ORAL | Status: DC

## 2019-07-27 MED ORDER — ROPIVACAINE HCL 5 MG/ML IJ SOLN
INTRAMUSCULAR | Status: DC | PRN
  Administered 2019-07-27: 30 mL via PERINEURAL

## 2019-07-27 MED ORDER — LACTATED RINGERS IV SOLN
INTRAVENOUS | Status: DC
  Administered 2019-07-27: 13:00:00 via INTRAVENOUS

## 2019-07-27 MED ORDER — FENTANYL CITRATE (PF) 250 MCG/5ML IJ SOLN
INTRAMUSCULAR | Status: AC
  Filled 2019-07-27: qty 5

## 2019-07-27 MED ORDER — FENTANYL CITRATE (PF) 100 MCG/2ML IJ SOLN
INTRAMUSCULAR | Status: AC
  Filled 2019-07-27: qty 2

## 2019-07-27 MED ORDER — LIDOCAINE 2% (20 MG/ML) 5 ML SYRINGE
INTRAMUSCULAR | Status: DC | PRN
  Administered 2019-07-27: 80 mg via INTRAVENOUS

## 2019-07-27 SURGICAL SUPPLY — 38 items
APPLIER CLIP 9.375 MED OPEN (MISCELLANEOUS) ×2
BINDER BREAST LRG (GAUZE/BANDAGES/DRESSINGS) IMPLANT
BINDER BREAST XLRG (GAUZE/BANDAGES/DRESSINGS) ×2 IMPLANT
CANISTER SUCT 3000ML PPV (MISCELLANEOUS) ×2 IMPLANT
CHLORAPREP W/TINT 26 (MISCELLANEOUS) ×2 IMPLANT
CLIP APPLIE 9.375 MED OPEN (MISCELLANEOUS) ×1 IMPLANT
CONT SPEC 4OZ CLIKSEAL STRL BL (MISCELLANEOUS) ×2 IMPLANT
COVER PROBE W GEL 5X96 (DRAPES) ×2 IMPLANT
COVER SURGICAL LIGHT HANDLE (MISCELLANEOUS) ×2 IMPLANT
COVER WAND RF STERILE (DRAPES) ×2 IMPLANT
DERMABOND ADVANCED (GAUZE/BANDAGES/DRESSINGS) ×1
DERMABOND ADVANCED .7 DNX12 (GAUZE/BANDAGES/DRESSINGS) ×1 IMPLANT
DEVICE DUBIN SPECIMEN MAMMOGRA (MISCELLANEOUS) ×2 IMPLANT
DRAPE CHEST BREAST 15X10 FENES (DRAPES) ×2 IMPLANT
ELECT CAUTERY BLADE 6.4 (BLADE) ×2 IMPLANT
ELECT REM PT RETURN 9FT ADLT (ELECTROSURGICAL) ×2
ELECTRODE REM PT RTRN 9FT ADLT (ELECTROSURGICAL) ×1 IMPLANT
GLOVE BIO SURGEON STRL SZ8 (GLOVE) ×2 IMPLANT
GLOVE BIOGEL PI IND STRL 8 (GLOVE) ×1 IMPLANT
GLOVE BIOGEL PI INDICATOR 8 (GLOVE) ×1
GOWN STRL REUS W/ TWL LRG LVL3 (GOWN DISPOSABLE) ×1 IMPLANT
GOWN STRL REUS W/ TWL XL LVL3 (GOWN DISPOSABLE) ×1 IMPLANT
GOWN STRL REUS W/TWL LRG LVL3 (GOWN DISPOSABLE) ×1
GOWN STRL REUS W/TWL XL LVL3 (GOWN DISPOSABLE) ×1
KIT BASIN OR (CUSTOM PROCEDURE TRAY) ×2 IMPLANT
KIT MARKER MARGIN INK (KITS) ×2 IMPLANT
LIGHT WAVEGUIDE WIDE FLAT (MISCELLANEOUS) IMPLANT
NEEDLE 18GX1X1/2 (RX/OR ONLY) (NEEDLE) IMPLANT
NEEDLE FILTER BLUNT 18X 1/2SAF (NEEDLE)
NEEDLE FILTER BLUNT 18X1 1/2 (NEEDLE) IMPLANT
NEEDLE HYPO 25GX1X1/2 BEV (NEEDLE) ×2 IMPLANT
NS IRRIG 1000ML POUR BTL (IV SOLUTION) ×2 IMPLANT
PACK GENERAL/GYN (CUSTOM PROCEDURE TRAY) ×2 IMPLANT
SUT MNCRL AB 4-0 PS2 18 (SUTURE) ×2 IMPLANT
SUT VIC AB 3-0 SH 18 (SUTURE) ×2 IMPLANT
SYR CONTROL 10ML LL (SYRINGE) ×2 IMPLANT
TOWEL GREEN STERILE (TOWEL DISPOSABLE) ×2 IMPLANT
TOWEL GREEN STERILE FF (TOWEL DISPOSABLE) ×2 IMPLANT

## 2019-07-27 NOTE — Discharge Instructions (Addendum)
[facility] °Office Phone Number [phone] ° °BREAST BIOPSY/ PARTIAL MASTECTOMY: POST OP INSTRUCTIONS ° °Always review your discharge instruction sheet given to you by the facility where your surgery was performed. ° °IF YOU HAVE DISABILITY OR FAMILY LEAVE FORMS, YOU MUST BRING THEM TO THE OFFICE FOR PROCESSING.  DO NOT GIVE THEM TO YOUR DOCTOR. ° °1. A prescription for pain medication may be given to you upon discharge.  Take your pain medication as prescribed, if needed.  If narcotic pain medicine is not needed, then you may take acetaminophen (Tylenol) or ibuprofen (Advil) as needed. °2. Take your usually prescribed medications unless otherwise directed °3. If you need a refill on your pain medication, please contact your pharmacy.  They will contact our office to request authorization.  Prescriptions will not be filled after 5pm or on week-ends. °4. You should eat very light the first 24 hours after surgery, such as soup, crackers, pudding, etc.  Resume your normal diet the day after surgery. °5. Most patients will experience some swelling and bruising in the breast.  Ice packs and a good support bra will help.  Swelling and bruising can take several days to resolve.  °6. It is common to experience some constipation if taking pain medication after surgery.  Increasing fluid intake and taking a stool softener will usually help or prevent this problem from occurring.  A mild laxative (Milk of Magnesia or Miralax) should be taken according to package directions if there are no bowel movements after 48 hours. °7. Unless discharge instructions indicate otherwise, you may remove your bandages 24-48 hours after surgery, and you may shower at that time.  You may have steri-strips (small skin tapes) in place directly over the incision.  These strips should be left on the skin for 7-10 days.  If your surgeon used skin glue on the incision, you may shower in 24 hours.  The glue will flake off over the  next 2-3 weeks.  Any sutures or staples will be removed at the office during your follow-up visit. °8. ACTIVITIES:  You may resume regular daily activities (gradually increasing) beginning the next day.  Wearing a good support bra or sports bra minimizes pain and swelling.  You may have sexual intercourse when it is comfortable. °a. You may drive when you no longer are taking prescription pain medication, you can comfortably wear a seatbelt, and you can safely maneuver your car and apply brakes. °b. RETURN TO WORK:  ______________________________________________________________________________________ °9. You should see your doctor in the office for a follow-up appointment approximately two weeks after your surgery.  Your doctor’s nurse will typically make your follow-up appointment when she calls you with your pathology report.  Expect your pathology report 2-3 business days after your surgery.  You may call to check if you do not hear from us after three days. °10. OTHER INSTRUCTIONS: _______________________________________________________________________________________________ _____________________________________________________________________________________________________________________________________ °_____________________________________________________________________________________________________________________________________ °_____________________________________________________________________________________________________________________________________ ° °WHEN TO CALL YOUR DOCTOR: °1. Fever over 101.0 °2. Nausea and/or vomiting. °3. Extreme swelling or bruising. °4. Continued bleeding from incision. °5. Increased pain, redness, or drainage from the incision. ° °The clinic staff is available to answer your questions during regular business hours.  Please don’t hesitate to call and ask to speak to one of the nurses for clinical concerns.  If you have a medical emergency, go to the nearest  emergency room or call 911.  A surgeon from [facility] is always on call at the hospital. ° °For further questions, please visit [email or web address]  °

## 2019-07-27 NOTE — Interval H&P Note (Signed)
History and Physical Interval Note:  07/27/2019 1:01 PM  Alisha [name]  has presented today for surgery, with the diagnosis of LEFT BREAST CANCER.  The various methods of treatment have been discussed with the patient and family. After consideration of risks, benefits and other options for treatment, the patient has consented to  Procedure(s): LEFT BREAST LUMPECTOMY WITH RADIOACTIVE SEED AND LEFT SENTINEL LYMPH NODE MAPPING (Left) as a surgical intervention.  The patient's history has been reviewed, patient examined, no change in status, stable for surgery.  I have reviewed the patient's chart and labs.  Questions were answered to the patient's satisfaction.     [place]

## 2019-07-27 NOTE — Op Note (Signed)
Preoperative diagnosis: Stage I left breast cancer  Postoperative diagnosis: Same  Procedure: Left breast seed lumpectomy with left axillary sentinel lymph node mapping  Surgeon: Erroll Luna, MD  Anesthesia: LMA with left pectoral block  EBL: 20 cc  Specimen: Left breast tissue with seed and clip verified by Faxitron plus additional superior margin sent separately and to left axillary sentinel nodes hot  Drains: None  IV fluids: Per anesthesia record  Indications for procedure the patient is a 84-year-old female stage I left breast cancer.  She opted for breast conserving surgery.  Risk, benefits of the treatment options as well as mastectomy with reconstruction were discussed with her and she was counseled by medical and radiation oncology.The procedure has been discussed with the patient. Alternatives to surgery have been discussed with the patient.  Risks of surgery include bleeding,  Infection,  Seroma formation, death,  and the need for further surgery.   The patient understands and wishes to proceed.Sentinel lymph node mapping and dissection has been discussed with the patient.  Risk of bleeding,  Infection,  Seroma formation,  Additional procedures,,  Shoulder weakness ,  Shoulder stiffness,  Nerve and blood vessel injury and reaction to the mapping dyes have been discussed.  Alternatives to surgery have been discussed with the patient.  The patient agrees to proceed.   Description of procedure: The patient was met in the holding area and questions were answered.  Left breast was marked as correct side and neoprobe used to verify seed location.  She underwent a pectoral block by anesthesia and injection of the left breast with technetium sulfur colloid for mapping.  All questions were answered.  She was brought back to the operative room.  She is placed supine upon the OR table.  After induction of general anesthesia, left breast was prepped and draped in sterile fashion timeout was  performed.  The localizing films were available for review.  Neoprobe was used to verify seed location.  This was in the upper outer quadrant.  Transverse incision was made over this.  Dissection was carried down all tissue and the seed clip were excised with grossly negative margins.  Upon reviewing the Faxitron image, both seed and clip present but I felt the superior margin was close therefore took additional superior margin.  Hemostasis achieved.  Wound was then closed with 3-0 Vicryl and 4-0 Monocryl.  The neoprobe settings were set the technetium.  Hotspot identified in left axilla.  Transverse incision made a 4 cm in the left axilla.  Dissection was carried down to the level 1 deep axillary node basin.  There were 2 hot nodes removed from the level 1 deep axillary node basin.  Background counts approached 0.  Hemostasis was achieved.  The long thoracic nerve, thoracodorsal trunk and axillary vein were preserved.  The wound was then irrigated and closed with 3-0 Vicryl and 4-0 Monocryl after assuring hemostasis.  Dermabond was applied to both incisions.  Breast binder placed.  All counts were correct.  The patient was awoke extubated taken recovery in satisfactory condition.

## 2019-07-27 NOTE — Transfer of Care (Signed)
Immediate Anesthesia Transfer of Care Note  Patient: Alisha [name]  Procedure(s) Performed: LEFT BREAST LUMPECTOMY WITH RADIOACTIVE SEED AND LEFT SENTINEL LYMPH NODE MAPPING (Left Breast)  Patient Location: PACU  Anesthesia Type:GA combined with regional for post-op pain  Level of Consciousness: awake, alert  and oriented  Airway & Oxygen Therapy: Patient Spontanous Breathing and Patient connected to face mask oxygen  Post-op Assessment: Report given to RN and Post -op Vital signs reviewed and stable  Post vital signs: Reviewed and stable  Last Vitals:  Vitals Value Taken Time  BP 179/69 07/27/19 1555  Temp    Pulse 52 07/27/19 1558  Resp 20 07/27/19 1558  SpO2 100 % 07/27/19 1558  Vitals shown include unvalidated device data.  Last Pain:  Vitals:   07/27/19 1320  TempSrc:   PainSc: 0-No pain         Complications: No apparent anesthesia complications

## 2019-07-27 NOTE — Anesthesia Postprocedure Evaluation (Signed)
Anesthesia Post Note  Patient: Editor, commissioning  Procedure(s) Performed: LEFT BREAST LUMPECTOMY WITH RADIOACTIVE SEED AND LEFT SENTINEL LYMPH NODE MAPPING (Left Breast)     Patient location during evaluation: PACU Anesthesia Type: Regional and General Level of consciousness: awake and alert, oriented and patient cooperative Pain management: pain level controlled Vital Signs Assessment: post-procedure vital signs reviewed and stable Respiratory status: spontaneous breathing, nonlabored ventilation and respiratory function stable Cardiovascular status: blood pressure returned to baseline and stable Postop Assessment: no apparent nausea or vomiting Anesthetic complications: no    Last Vitals:  Vitals:   07/27/19 1625 07/27/19 1655  BP: (!) 192/70 (!) 185/65  Pulse:  (!) 50  Resp:  19  Temp:  36.5 C  SpO2:  100%    Last Pain:  Vitals:   07/27/19 1655  TempSrc:   PainSc: [place]

## 2019-07-27 NOTE — Anesthesia Procedure Notes (Signed)
Procedure Name: LMA Insertion Date/Time: 07/27/2019 3:02 PM Performed by: Candis Shine, CRNA Pre-anesthesia Checklist: Patient identified, Emergency Drugs available, Suction available and Patient being monitored Patient Re-evaluated:Patient Re-evaluated prior to induction Oxygen Delivery Method: Circle System Utilized Preoxygenation: Pre-oxygenation with 100% oxygen Induction Type: IV induction LMA: LMA with gastric port inserted LMA Size: 4.0 Number of attempts: 3 Placement Confirmation: positive ETCO2 Tube secured with: Tape Dental Injury: Teeth and Oropharynx as per pre-operative assessment  Comments: CRNA and MD unable to seat LMA 4. LMA 4 with gastric port utilized.

## 2019-07-27 NOTE — Anesthesia Procedure Notes (Signed)
Anesthesia Regional Block: Pectoralis block   Pre-Anesthetic Checklist: ,, timeout performed, Correct Patient, Correct Site, Correct Laterality, Correct Procedure, Correct Position, site marked, Risks and benefits discussed,  Surgical consent,  Pre-op evaluation,  At surgeon's request and post-op pain management  Laterality: Left  Prep: Maximum Sterile Barrier Precautions used, chloraprep       Needles:  Injection technique: Single-shot  Needle Type: Echogenic Stimulator Needle     Needle Length: 9cm  Needle Gauge: 22     Additional Needles:   Procedures:,,,, ultrasound used (permanent image in chart),,,,  Narrative:  Start time: 07/27/2019 2:20 PM End time: 07/27/2019 2:30 PM Injection made incrementally with aspirations every 5 mL.  Performed by: Personally  Anesthesiologist: Freddrick March, MD  Additional Notes: Monitors applied. No increased pain on injection. No increased resistance to injection. Injection made in 5cc increments. Good needle visualization. Patient tolerated procedure well.

## 2019-07-28 ENCOUNTER — Encounter (HOSPITAL_COMMUNITY): Payer: Self-pay | Admitting: Surgery

## 2019-08-01 ENCOUNTER — Encounter: Payer: Self-pay | Admitting: *Deleted

## 2019-08-09 ENCOUNTER — Other Ambulatory Visit: Payer: Self-pay | Admitting: Cardiology

## 2019-08-09 NOTE — Telephone Encounter (Signed)
°*  STAT* If patient is at the pharmacy, call can be transferred to refill team.   1. Which medications need to be refilled? (please list name of each medication and dose if known) Atenolol 25mg   2. Which pharmacy/location (including street and city if local pharmacy) is medication to be sent to?[facility] [address]  3. Do they need a 30 day or 90 day supply? [place]

## 2019-08-10 ENCOUNTER — Encounter (HOSPITAL_COMMUNITY): Payer: Self-pay | Admitting: Emergency Medicine

## 2019-08-10 ENCOUNTER — Other Ambulatory Visit: Payer: Self-pay

## 2019-08-10 ENCOUNTER — Emergency Department (HOSPITAL_COMMUNITY)
Admission: EM | Admit: 2019-08-10 | Discharge: 2019-08-11 | Disposition: A | Discharge status: Home / Self Care | Payer: Medicare HMO | Attending: Emergency Medicine | Admitting: Emergency Medicine

## 2019-08-10 DIAGNOSIS — Z5321 Procedure and treatment not carried out due to patient leaving prior to being seen by health care provider: Secondary | ICD-10-CM | POA: Insufficient documentation

## 2019-08-10 DIAGNOSIS — Z9889 Other specified postprocedural states: Secondary | ICD-10-CM | POA: Diagnosis present

## 2019-08-10 MED ORDER — ATENOLOL 25 MG PO TABS: 25.0000 mg | ORAL_TABLET | Freq: Every day | ORAL | 6 refills | Status: DC

## 2019-08-10 NOTE — Telephone Encounter (Signed)
Rx(s) sent to pharmacy electronically.  

## 2019-08-10 NOTE — ED Triage Notes (Signed)
Patient noticed blood at left breast incision this morning ( S/P breast surgery last 07/27/19) , no blood or drainage at triage /incion site intact.

## 2019-08-11 NOTE — Progress Notes (Signed)
[place]  [phone] [phone]     ID: Alisha [name] DOB: 05-20-1935  MR#: [id number]  TOI#:[id number]  Patient Care Team: [name], MD as PCP - General (Family Medicine) Mauro Kaufmann, RN as Oncology Nurse Navigator Rockwell Germany, RN as Oncology Nurse Navigator Erroll Luna, MD as Consulting Physician (General Surgery) Eppie Gibson, MD as Attending Physician (Radiation Oncology) Leonie Man, MD as Consulting Physician (Cardiology) Chauncey Cruel, MD OTHER MD:  CHIEF COMPLAINT: triple negative breast cancer  CURRENT TREATMENT: Adjuvant radiation pending   INTERVAL HISTORY: Alisha [name] returns today for follow up of her triple negative breast cancer.  Since her last visit, she underwent left breast lumpectomy on 07/27/2019 under Dr. Brantley Stage. Pathology from the procedure ([id number]) showed: invasive ductal carcinoma, 0.9 cm; margins uninvolved.  The two biopsied left lymph nodes were negative for metastatic carcinoma.  She is scheduled to meet with Dr. Isidore Moos 08/19/2019 for adjuvant radiation  Her most recent bone density screening was performed on 07/06/2018 and showed a T-score of 1.0.   REVIEW OF SYSTEMS: Alisha [name] did generally well with the surgery.  She only had to take 1 oxycodone.  She is taking Tylenol occasionally now for pain.  She did have some bleeding and she had some fluid drained this morning.  She tells me she is scheduled to see Dr. Brantley Stage next week possibly for further drainage.  She is wearing a compression binder.  She has not had any fever, or other complications and detailed review of systems today was otherwise benign   HISTORY OF CURRENT ILLNESS: Alisha [name] was noted to have a left breast upper-outer quadrant distortion on screening mammogram in 2017. She proceeded to biopsy ([id number]), which showed a complex sclerosing lesion. She opted to undergo left lumpectomy ([id number])  on 08/11/2016 under Dr. Brantley Stage, which revealed a radial scar with calcifications.   She presented for routine screening mammogram, which showed a possible mass and adjacent calcifications in the left breast. She was referred to [facility] for further imaging on 06/16/2019. Physical exam performed that day showed a palpable soft thickening in the upper central aspect of the left breast. She underwent left diagnostic mammography with tomography and ultrasonography showing: breast density category B; suspicious 7 mm mass in the 12 o'clock location of the left breast; small indeterminate group of calcifications 4 mm anterior to the mass warrant excision if the mass is positive for malignancy; left axilla negative for adenopathy.  Accordingly on 06/22/2019 she proceeded to biopsy of the left breast area in question. The pathology from this procedure ([id number]) showed: invasive ductal carcinoma, grade 3. Prognostic indicators significant for: estrogen receptor, 0% negative and progesterone receptor, 0% negative. Proliferation marker Ki67 at 30%. HER2 negative by immunohistochemistry (0).  The patient's subsequent history is as detailed below.   PAST MEDICAL HISTORY: Past Medical History:  Diagnosis Date   Arthritis    Asthma    Never smoker   Blood transfusion    over 45 y ears   Breast mass, left    Cancer ([place])    Hypertension    on meds   Hypothyroidism    on meds   Pre-diabetes     PAST SURGICAL HISTORY: Past Surgical History:  Procedure Laterality Date   ABDOMINAL HYSTERECTOMY     32 yrs   BREAST LUMPECTOMY WITH RADIOACTIVE SEED AND SENTINEL LYMPH NODE BIOPSY Left 07/27/2019   Procedure: LEFT BREAST LUMPECTOMY WITH RADIOACTIVE SEED AND LEFT SENTINEL LYMPH  NODE MAPPING;  Surgeon: Erroll Luna, MD;  Location: [facility];  Service: General;  Laterality: Left;   BREAST LUMPECTOMY WITH RADIOACTIVE SEED LOCALIZATION Left 08/11/2016   Procedure: LEFT BREAST LUMPECTOMY WITH  RADIOACTIVE SEED LOCALIZATION;  Surgeon: Erroll Luna, MD;  Location: [facility];  Service: General;  Laterality: Left;   CATARACT EXTRACTION, BILATERAL     EYE SURGERY     JOINT REPLACEMENT Bilateral    left knee 2012   KNEE ARTHROPLASTY  02/17/2012   Procedure: COMPUTER ASSISTED TOTAL KNEE ARTHROPLASTY;  Surgeon: Meredith Pel, MD;  Location: [facility];  Service: Orthopedics;  Laterality: Right;  Right total knee replacement    FAMILY HISTORY: Family History  Problem Relation Age of Onset   Other Mother        died @ 33 - but was healthy   Hypertension Sister    Diabetes Sister    Diabetes Brother        no longer on medications.    Anesthesia problems Neg Hx    Hypotension Neg Hx    Malignant hyperthermia Neg Hx    Pseudochol deficiency Neg Hx    Patient's father was in his mid 69s years old when he died from unknown causes. Patient's mother died from "natural causes" at age 35. The patient denies a family hx of breast or ovarian cancer. She has 5 brothers and 1 sister.   GYNECOLOGIC HISTORY:  No LMP recorded. Patient is postmenopausal. 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  She is now retired.  She describes herself a single.  Her daughter Alisha [name] lives in [place].  She has multiple medical problems.  The patient's son died at age 23.  The patient has 1 grandchild, Alisha [name], who lives in [place] and works for [employer].  He has 4 children.  The patient herself attends a local [employer] DIRECTIVES: The patient intends to name her grandson Alisha [name] as her healthcare power of attorney.  He can be reached at  [phone].  She was given the appropriate documents to complete and notarize at her discretion at the time of her 07/21/2019 visit   HEALTH MAINTENANCE: Social History   Tobacco Use   Smoking status: Never Smoker   Smokeless tobacco: Never Used  Substance Use Topics   Alcohol use: No   Drug use: No     Allergies  Allergen Reactions   Lisinopril Itching    No energy   Penicillins Itching and Rash    Has patient had a PCN reaction causing immediate rash, facial/tongue/throat swelling, SOB or lightheadedness with hypotension: No Has patient had a PCN reaction causing severe rash involving mucus membranes or skin necrosis: No Has patient had a PCN reaction that required hospitalization: No Has patient had a PCN reaction occurring within the last 10 years: No If all of the above answers are "NO", then may proceed with Cephalosporin use.   Sulfa Antibiotics Itching and Rash    Current Outpatient Medications  Medication Sig Dispense Refill   acetaminophen (TYLENOL) 500 MG tablet Take 1,000 mg by mouth every 6 (six) hours as needed for moderate pain.      albuterol (PROVENTIL HFA;VENTOLIN HFA)  108 (90 Base) MCG/ACT inhaler Inhale 1-2 puffs into the lungs every 6 (six) hours as needed for wheezing or shortness of breath.      Artificial Tear Solution (SOOTHE XP OP) Place 1 drop into both eyes 3 (three) times daily as needed (dry or irritated eyes).     atenolol (TENORMIN) 25 MG tablet Take 1 tablet (25 mg total) by mouth daily. 30 tablet 6   Biotin 5 MG TABS Take 5 mg by mouth daily.      cetaphil (CETAPHIL) cream Apply 1 application topically 2 (two) times daily.      diphenhydrAMINE HCl (ZZZQUIL) 50 MG/30ML LIQD Take 50 mg by mouth at bedtime as needed (sleep).      fluticasone (FLONASE) 50 MCG/ACT nasal spray Place 2 sprays into both nostrils daily as needed for allergies or rhinitis.     furosemide (LASIX) 20 MG tablet Take 20 mg by mouth daily as needed for fluid  or edema.     HYDROcodone-acetaminophen (NORCO/VICODIN) 5-325 MG tablet Take 1 tablet by mouth every 6 (six) hours as needed for moderate pain. 15 tablet 0   levothyroxine (SYNTHROID, LEVOTHROID) 88 MCG tablet Take 88 mcg by mouth daily before breakfast.      meclizine (ANTIVERT) 25 MG tablet Take 12.5 mg by mouth 3 (three) times daily as needed for dizziness.     metoprolol tartrate (LOPRESSOR) 50 MG tablet Take 1 tablet (50 mg total) by mouth once for 1 dose. TAKE TWO HOUR PRIOR TO  SCHEDULE CARDAIC TEST (Patient not taking: Reported on 07/19/2019) 1 tablet 0   Multiple Vitamins-Minerals (ICAPS AREDS 2 PO) Take 1 capsule by mouth 2 (two) times daily.      olmesartan (BENICAR) 20 MG tablet Take 1 tablet (20 mg total) by mouth daily. 90 tablet 3   potassium chloride SA (KLOR-CON) 20 MEQ tablet Take 20 mEq by mouth daily as needed (when taking furosemide).     No current facility-administered medications for this visit.     OBJECTIVE: Older African-American woman in no acute distress  Vitals:   08/12/19 1421  BP: (!) 176/59  Pulse: (!) 42  Resp: 18  Temp: 98.5 F (36.9 C)  SpO2: 100%     Body mass index is 37.94 kg/m.   Wt Readings from Last 3 Encounters:  08/12/19 224 lb 8 oz (101.8 kg)  07/27/19 220 lb (99.8 kg)  07/22/19 222 lb (100.7 kg)      ECOG FS:1 - Symptomatic but completely ambulatory  Sclerae unicteric, EOMs intact, Wearing a mask No cervical or supraclavicular adenopathy Lungs no rales or rhonchi Heart regular rate and rhythm Abd soft, nontender, positive bowel sounds MSK no focal spinal tenderness, no upper extremity lymphedema Neuro: nonfocal, well oriented, appropriate affect Breasts: The right breast is benign.  The left breast is status post recent lumpectomy.  The breast lumpectomy scar is bandaged over because of the recent procedure.  The left axillary scar is healing nicely, without erythema dehiscence or swelling.  LAB RESULTS:  CMP       Component Value Date/Time   NA 141 07/21/2019 1525   NA 140 05/10/2019 1457   K 4.4 07/21/2019 1525   CL 107 07/21/2019 1525   CO2 25 07/21/2019 1525   GLUCOSE 124 (H) 07/21/2019 1525   BUN 14 07/21/2019 1525   BUN 18 05/10/2019 1457   CREATININE 0.78 07/21/2019 1525   CALCIUM 9.2 07/21/2019 1525   PROT 6.6 07/21/2019 1525   ALBUMIN 4.1 07/21/2019  1525   AST 15 07/21/2019 1525   ALT 19 07/21/2019 1525   ALKPHOS 80 07/21/2019 1525   BILITOT 0.2 (L) 07/21/2019 1525   GFRNONAA >60 07/21/2019 1525   GFRAA >60 07/21/2019 1525    No results found for: TOTALPROTELP, ALBUMINELP, A1GS, A2GS, BETS, BETA2SER, GAMS, MSPIKE, SPEI  No results found for: KPAFRELGTCHN, LAMBDASER, KAPLAMBRATIO  Lab Results  Component Value Date   WBC 11.1 (H) 07/21/2019   NEUTROABS 6.6 07/21/2019   HGB 12.7 07/21/2019   HCT 39.6 07/21/2019   MCV 95.7 07/21/2019   PLT 251 07/21/2019    '@LASTCHEMISTRY'$ @  No results found for: LABCA2  No components found for: QQPYPP509  No results for input(s): INR in the last 168 hours.  No results found for: LABCA2  No results found for: TOI712  No results found for: WPY099  No results found for: IPJ825  No results found for: CA2729  No components found for: HGQUANT  No results found for: CEA1 / No results found for: CEA1   No results found for: AFPTUMOR  No results found for: CHROMOGRNA  No results found for: PSA1  No visits with results within 3 Day(s) from this visit.  Latest known visit with results is:  Admission on 07/27/2019, Discharged on 07/27/2019  Component Date Value Ref Range Status   Glucose-Capillary 07/27/2019 90  70 - 99 mg/dL Final   Comment 1 07/27/2019 Notify RN   Final   Comment 2 07/27/2019 Document in Chart   Final   SURGICAL PATHOLOGY 07/27/2019    Final-Edited                   Value:SURGICAL PATHOLOGY CASE: [id number] PATIENT: Alisha [name] Surgical Pathology Report     Clinical History: Left breast  cancer ([place])     DIAGNOSIS:  A. BREAST; LEFT; LUMPECTOMY: - Invasive ductal carcinoma, 0.9 cm. - Invasive carcinoma focally less than 0.1 cm from superior margin. - Biopsy site and biopsy clip.  Oncology table INVASIVE CARCINOMA OF THE BREAST:  Resection  Procedure: Localization lumpectomy, additional superior margin and 2 sentinel lymph nodes. Specimen Laterality: Left breast Tumor Size: 0.9 x 0.8 x 0.7 cm Histologic Type: Ductal Histologic Grade:      Glandular (Acinar)/Tubular Differentiation: 2      Nuclear Pleomorphism: 2      Mitotic Rate: 2      Overall Grade: 6 Ductal Carcinoma In Situ: N/A Margins: Free of tumor.      Distance from closest margin (millimeters): Greater than 10 mm from final superior margin Regional Lymph Nodes:      Number of Lymph Nodes Examined: 2      Number of Sentinel Nodes Examined (if applicable                         ): 2      Number of Lymph Nodes with Macrometastases (>2 mm): 0      Number of Lymph Nodes with f: 0      Number of Lymph Nodes with Isolated Tumor Cells (=0.2 mm or =200 cells): 0 Treatment Effect:  No known presurgical therapy Breast Biomarker Testing Performed on Previous Biopsy:       Testing Performed on Case Number: Yes            Estrogen Receptor: 0%, negative            Progesterone Receptor: 0%, negative  HER2: Negative with IHC            Ki-67: 30% Representative Tumor Block: A1, A2, A3 and A4 Pathologic Stage Classification (pTNM, AJCC 8th Edition): pT1b, pN0  (v4.4.0.0)    B. BREAST; SUPERIOR; LEFT; MARGIN; EXCISION: - Benign breast tissue. - No tumor identified. - Final superior margin clear.  C. LYMPH NODE; SENTINEL; AXILLARY; LEFT; BIOPSY: - One lymph node with no metastatic carcinoma (0/1).  D. LYMPH NODE; SENTINEL; AXILLARY; LEFT; BIOPSY: - One lymph node with no metastatic carcinoma (0/1).   GROSS DESCRIPTION:  A: Specimen                          type: Left breast seed  localization lumpectomy, received fresh.  The specimen was placed in formalin at 3:45 PM on 07/27/2019. Size: 6.8 cm at the anterior-posterior axis, 5.5 cm at the superior-inferior axis and 3.1 cm at the lateral-medial axis. Orientation: The specimen is oriented with previously applied inks (anterior green, inferior blue, lateral orange, medial yellow, posterior black, superior red). Localized area: There are pins inserted in the specimen identifying a localization seed and biopsy clip.  The localization seed is identified at the time of receipt. Cut surface: At the localized area there is a 0.9 x 0.8 x 0.7 cm indurated white nodule which has ill-defined borders.  There is hemorrhage surrounding the nodule and a ribbon-shaped biopsy clip present.  The remainder the breast tissue consists of soft yellow adipose tissue and white fibrous tissue. Margins: The nodule is located 0.2 cm from the superior margin and the hemorrhage surrounding the nodule fo                         cally extends to the superior margin.  The remaining margins measure greater than 1 cm. Prognostic indicators: Obtained from paraffin blocks if needed. Block summary: 7 blocks submitted 1-4 = entire nodule with superior margin 5 = inferior margin 6 = anterior and posterior margins 7 = lateral and medial margins  B: Received fresh and placed in formalin at 4 PM on 07/27/2019 is a 3.5 x 2.6 x 1.2 cm portion of fibroadipose tissue clinical identified as left superior breast tissue margin.  The superior margin is previously marked with red ink.  The cut surfaces consists of soft yellow adipose tissue and white fibrous tissue.  Tumor is not grossly identified.  The specimen is sectioned and entirely submitted in 7 cassettes.    C: Received fresh is a 1.1 x 0.7 x 0.6 cm rubbery tan-yellow nodule. The specimen is bisected and entirely submitted in 1 cassette.  D: Received fresh is a 1.7 x 1.0 x 0.9 cm rubbery tan-yellow  nodule. The specimen is sectioned and entirely submi                         tted in 1 cassette.  Final Diagnosis performed by Claudette Laws, MD.   Electronically signed 07/29/2019 Technical and / or Professional components performed at [facility]. [facility], [place] [address] [place], [place] [address].  Immunohistochemistry Technical component (if applicable) was performed at [facility]. [address] [place], [place], [place] [address].   IMMUNOHISTOCHEMISTRY DISCLAIMER (if applicable): Some of these immunohistochemical stains may have been developed and the performance characteristics determine by [facility]. Some may not have been cleared or approved by the U.S. Food and  Drug Administration. The FDA has determined that such clearance or approval is not necessary. This test is used for clinical purposes. It should not be regarded as investigational or for research. This laboratory is certified under the [place] (CLIA-88) as qualified to perform high complexity                          clinical laboratory testing.  The controls stained appropriately.    Glucose-Capillary 07/27/2019 68* 70 - 99 mg/dL Final   Comment 1 07/27/2019 Notify RN   Final   Comment 2 07/27/2019 Document in Chart   Final    (this displays the last labs from the last 3 days)  No results found for: TOTALPROTELP, ALBUMINELP, A1GS, A2GS, BETS, BETA2SER, GAMS, MSPIKE, SPEI (this displays SPEP labs)  No results found for: KPAFRELGTCHN, LAMBDASER, KAPLAMBRATIO (kappa/lambda light chains)  No results found for: HGBA, HGBA2QUANT, HGBFQUANT, HGBSQUAN (Hemoglobinopathy evaluation)   No results found for: LDH  No results found for: IRON, TIBC, IRONPCTSAT (Iron and TIBC)  No results found for: FERRITIN  Urinalysis    Component Value Date/Time   COLORURINE YELLOW 07/02/2018 [place] 07/02/2018 1752    LABSPEC 1.021 07/02/2018 1752   PHURINE 5.0 07/02/2018 1752   GLUCOSEU NEGATIVE 07/02/2018 1752   HGBUR NEGATIVE 07/02/2018 1752   BILIRUBINUR NEGATIVE 07/02/2018 1752   KETONESUR 20 (A) 07/02/2018 1752   PROTEINUR NEGATIVE 07/02/2018 1752   UROBILINOGEN 1.0 02/20/2012 0927   NITRITE NEGATIVE 07/02/2018 1752   LEUKOCYTESUR NEGATIVE 07/02/2018 1752     STUDIES: Nm Sentinel Node Inj-no Rpt (breast)  Result Date: 07/27/2019 Sulfur colloid was injected by the nuclear medicine technologist for melanoma sentinel node.   Mm Breast Surgical Specimen  Result Date: 07/27/2019 CLINICAL DATA:  Patient is post radioactive seed localization subsequent surgical excision of biopsy-proven malignancy left breast. EXAM: SPECIMEN RADIOGRAPH OF THE LEFT BREAST COMPARISON:  Previous exam(s). FINDINGS: Status post excision of the left breast. The radioactive seed and biopsy marker clip and adjacent group of microcalcifications are present, completely intact, and were marked for pathology. Results called to the OR at the time of dictation. IMPRESSION: Specimen radiograph of the left breast. Electronically Signed   By: Marin Olp M.D.   On: 07/27/2019 15:25   Mm Lt Radioactive Seed Loc Mammo Guide  Result Date: 07/27/2019 CLINICAL DATA:  Patient presents for radioactive seed localization prior to surgical excision of biopsy-proven malignancy over the upper inner left breast. EXAM: MAMMOGRAPHIC GUIDED RADIOACTIVE SEED LOCALIZATION OF THE LEFT BREAST COMPARISON:  Previous exam(s). FINDINGS: Patient presents for radioactive seed localization prior to surgical excision. I met with the patient and we discussed the procedure of seed localization including benefits and alternatives. We discussed the high likelihood of a successful procedure. We discussed the risks of the procedure including infection, bleeding, tissue injury and further surgery. We discussed the low dose of radioactivity involved in the procedure.  Informed, written consent was given. The usual time-out protocol was performed immediately prior to the procedure. Using mammographic guidance, sterile technique, 1% lidocaine and an I-125 radioactive seed, the targeted mass/ribbon clip was localized using a superior to inferior approach. The seed lies immediately anterior inferior to the clip at the biopsy site as the adjacent group of microcalcifications is 5 mm anterior to the seed. The follow-up mammogram images confirm the seed in the expected location and were marked for Dr. Brantley Stage. Follow-up survey of the  patient confirms presence of the radioactive seed. Order number of I-125 seed:  [id number]. Total activity:  4.709 millicurie reference Date: 07/18/2019 The patient tolerated the procedure well and was released from the [facility]. She was given instructions regarding seed removal. IMPRESSION: Radioactive seed localization left breast. No apparent complications. Electronically Signed   By: Marin Olp M.D.   On: 07/27/2019 11:38    ELIGIBLE FOR AVAILABLE RESEARCH PROTOCOL: no  ASSESSMENT: 84 y.o. [employer], [place] woman status post left breast upper outer quadrant biopsy 06/22/2019 for a clinical T1b N0, stage IB invasive ductal carcinoma, grade 3, triple negative, with an MIB-1-1 of 30%.  (1) status post left lumpectomy and sentinel lymph node sampling 07/27/2019 for a PT1n pN0, stage IB invasive ductal carcinoma, with clear margins.  (a) 2 left axillary lymph nodes were removed  (2) no adjuvant chemotherapy planned  (3) adjuvant radiation pending  PLAN:  Shanieka did well with her surgery.  She still has a little bit of a seroma and that is being taken care of.  She will be ready to start adjuvant radiation sometime next month.  She is aware of the reason for the need for radiation and some of the possible side effects and toxicities.  Given her age, the size of this tumor, and the fact that chemotherapy is the only systemic option,  I think the benefit that she might get from chemotherapy is sufficiently small that we can skip it safely.  We did discuss that today and she is in agreement.  I have urged her to complete a formal healthcare power of attorney document and get it notarized.  She does intend to name her grandson as healthcare power of attorney.  Once she brings it and I will added to her chart.  Otherwise she will return to see me in 6 months.  She knows to call for any other issue that may develop before then.   Chauncey Cruel, MD   08/12/2019 2:44 PM Medical Oncology and Hematology [facility] [place], [place] [address] Tel. [phone]    Fax. [phone]   This document serves as a record of services personally performed by Lurline Del, MD. It was created on his behalf by Wilburn Mylar, a trained medical scribe. The creation of this record is based on the scribe's personal observations and the provider's statements to them.   I, Lurline Del MD, have reviewed the above documentation for accuracy and completeness, and I agree with the above.

## 2019-08-11 NOTE — ED Notes (Signed)
Pt called for vitals no answer. °

## 2019-08-12 ENCOUNTER — Inpatient Hospital Stay (HOSPITAL_BASED_OUTPATIENT_CLINIC_OR_DEPARTMENT_OTHER): Payer: Medicare HMO | Admitting: Oncology

## 2019-08-12 ENCOUNTER — Other Ambulatory Visit: Payer: Self-pay

## 2019-08-12 VITALS — BP 176/59 | HR 42 | Temp 98.5°F | Resp 18 | Ht 64.5 in | Wt 224.5 lb

## 2019-08-12 DIAGNOSIS — I10 Essential (primary) hypertension: Secondary | ICD-10-CM | POA: Diagnosis not present

## 2019-08-12 DIAGNOSIS — Z171 Estrogen receptor negative status [ER-]: Secondary | ICD-10-CM

## 2019-08-12 DIAGNOSIS — R7303 Prediabetes: Secondary | ICD-10-CM | POA: Diagnosis not present

## 2019-08-12 DIAGNOSIS — Z90722 Acquired absence of ovaries, bilateral: Secondary | ICD-10-CM | POA: Diagnosis not present

## 2019-08-12 DIAGNOSIS — E039 Hypothyroidism, unspecified: Secondary | ICD-10-CM | POA: Diagnosis not present

## 2019-08-12 DIAGNOSIS — R001 Bradycardia, unspecified: Secondary | ICD-10-CM | POA: Diagnosis not present

## 2019-08-12 DIAGNOSIS — C50412 Malignant neoplasm of upper-outer quadrant of left female breast: Secondary | ICD-10-CM

## 2019-08-12 DIAGNOSIS — Z79899 Other long term (current) drug therapy: Secondary | ICD-10-CM | POA: Diagnosis not present

## 2019-08-12 DIAGNOSIS — M1990 Unspecified osteoarthritis, unspecified site: Secondary | ICD-10-CM | POA: Diagnosis not present

## 2019-08-12 LAB — SURGICAL PATHOLOGY

## 2019-08-12 NOTE — Progress Notes (Signed)
Location of Breast Cancer: Left Breast  Histology per Pathology Report:  06/22/19 Diagnosis Breast, left, needle core biopsy, 12 o'clock - INVASIVE DUCTAL CARCINOMA, GRADE III.  Receptor Status: ER(NEG), PR (NEG), Her2-neu (NEG), Ki-(30%)  07/27/19 DIAGNOSIS: A. BREAST; LEFT; LUMPECTOMY: - Invasive ductal carcinoma, 0.9 cm. - Invasive carcinoma focally less than 0.1 cm from superior margin. - Biopsy site and biopsy clip.  Did patient present with symptoms or was this found on screening mammography?: It was discovered on a screening mammogram.   Past/Anticipated interventions by surgeon, if any: 07/27/19 Procedure: Left breast seed lumpectomy with left axillary sentinel lymph node mapping Surgeon: Erroll Luna, MD  Past/Anticipated interventions by medical oncology, if any:  08/12/19 Dr. Jana Hakim (1) status post left lumpectomy and sentinel lymph node sampling 07/27/2019 for a PT1n pN0, stage IB invasive ductal carcinoma, with clear margins.             (a) 2 left axillary lymph nodes were removed (2) no adjuvant chemotherapy planned (3) adjuvant radiation pending   Lymphedema issues, if any:  She denies swelling. She reports decreased mobility to her left arm.   Pain issues, if any: She denies.    SAFETY ISSUES:  Prior radiation? No  Pacemaker/ICD? No  Possible current pregnancy? No  Is the patient on methotrexate? No  Current Complaints / other details:   Alisha [name] was noted to have a left breast upper-outer quadrant distortion on screening mammogram in 2017. She proceeded to biopsy ([id number]), which showed a complex sclerosing lesion. She opted to undergo left lumpectomy ([id number]) on 08/11/2016 under Dr. Brantley Stage, which revealed a radial scar with calcifications.     [name], Alisha Police, RN 08/12/2019,9:42 AM

## 2019-08-15 ENCOUNTER — Telehealth: Payer: Self-pay | Admitting: Oncology

## 2019-08-15 ENCOUNTER — Encounter: Payer: Self-pay | Admitting: *Deleted

## 2019-08-15 NOTE — Telephone Encounter (Signed)
I could not reach patient regarding schedule so I will mail

## 2019-08-18 ENCOUNTER — Telehealth: Payer: Self-pay | Admitting: Radiation Oncology

## 2019-08-18 NOTE — Progress Notes (Signed)
Radiation Oncology         [phone]) [phone] ________________________________  Initial outpatient Consultation by telephone as patient was unable to access WebEx during pandemic precautions   Name: Alisha [name] MRN: [id number]  Date: 08/19/2019  DOB: [birth year]  CC:Alisha Pepper, MD  Erroll Luna, MD   REFERRING PHYSICIAN: Erroll Luna, MD  DIAGNOSIS:    ICD-10-CM   1. Malignant neoplasm of upper-outer quadrant of left breast in female, estrogen receptor negative ([place])  C50.412    Z17.1    Stage IB Left Breast UOQ Invasive Ductal Carcinoma, ER- / PR- / Her2-, Grade 3  Cancer Staging Malignant neoplasm of upper-outer quadrant of left breast in female, estrogen receptor negative ([place]) Staging form: Breast, AJCC 8th Edition - Clinical stage from 06/22/2019: Stage IB (cT1b, cN0, cM0, G3, ER-, PR-, HER2-) - Signed by Gardenia Phlegm, NP on 06/29/2019   CHIEF COMPLAINT: Here to discuss management of left breast cancer  HISTORY OF PRESENT ILLNESS::Alisha [name] is a 84 y.o. female with a history of left lumpectomy in 07/2016 with benign pathology. She presented with breast abnormality on the following imaging: screening mammogram on the date of 06/07/2019.  Symptoms, if any, at that time, were: none.   Ultrasound of breast on 06/16/2019 revealed suspicious 7 mm mass in the left breast at 12 o'clock and indeterminate calcifications anterior to the mass.   Biopsy on date of 06/22/2019 showed invasive ductal carcinoma.  ER status: negative; PR status negative, Her2 status negative; Grade 3.  She opted to proceed with left breast lumpectomy on date of 07/27/2019 with pathology report revealing: tumor size of 0.9 cm; histology of invasive ductal carcinoma; margin status to invasive disease of >10 mm; nodal status of negative (0/2); Grade 2.  No chemotherapy recommended.  She reports that she has a hematoma that is going to be drained in the near future by her surgeon at the  lumpectomy site.  It has been painful.  PREVIOUS RADIATION THERAPY: No  PAST MEDICAL HISTORY:  has a past medical history of Arthritis, Asthma, Blood transfusion, Breast mass, left, Cancer ([place]), Hypertension, Hypothyroidism, and Pre-diabetes.    PAST SURGICAL HISTORY: Past Surgical History:  Procedure Laterality Date   ABDOMINAL HYSTERECTOMY     32 yrs   BREAST LUMPECTOMY WITH RADIOACTIVE SEED AND SENTINEL LYMPH NODE BIOPSY Left 07/27/2019   Procedure: LEFT BREAST LUMPECTOMY WITH RADIOACTIVE SEED AND LEFT SENTINEL LYMPH NODE MAPPING;  Surgeon: Erroll Luna, MD;  Location: [facility];  Service: General;  Laterality: Left;   BREAST LUMPECTOMY WITH RADIOACTIVE SEED LOCALIZATION Left 08/11/2016   Procedure: LEFT BREAST LUMPECTOMY WITH RADIOACTIVE SEED LOCALIZATION;  Surgeon: Erroll Luna, MD;  Location: [facility];  Service: General;  Laterality: Left;   CATARACT EXTRACTION, BILATERAL     EYE SURGERY     JOINT REPLACEMENT Bilateral    left knee 2012   KNEE ARTHROPLASTY  02/17/2012   Procedure: COMPUTER ASSISTED TOTAL KNEE ARTHROPLASTY;  Surgeon: Meredith Pel, MD;  Location: [facility];  Service: Orthopedics;  Laterality: Right;  Right total knee replacement    FAMILY HISTORY: family history includes Diabetes in her brother and sister; Hypertension in her sister; Other in her mother.  SOCIAL HISTORY:  reports that she has never smoked. She has never used smokeless tobacco. She reports that she does not drink alcohol or use drugs.  ALLERGIES: Lisinopril, Penicillins, and Sulfa antibiotics  MEDICATIONS:  Current Outpatient Medications  Medication Sig Dispense Refill   acetaminophen (  TYLENOL) 500 MG tablet Take 1,000 mg by mouth every 6 (six) hours as needed for moderate pain.      albuterol (PROVENTIL HFA;VENTOLIN HFA) 108 (90 Base) MCG/ACT inhaler Inhale 1-2 puffs into the lungs every 6 (six) hours as needed for wheezing or shortness of breath.      Artificial  Tear Solution (SOOTHE XP OP) Place 1 drop into both eyes 3 (three) times daily as needed (dry or irritated eyes).     atenolol (TENORMIN) 25 MG tablet Take 1 tablet (25 mg total) by mouth daily. 30 tablet 6   Biotin 5 MG TABS Take 5 mg by mouth daily.      cetaphil (CETAPHIL) cream Apply 1 application topically 2 (two) times daily.      diphenhydrAMINE HCl (ZZZQUIL) 50 MG/30ML LIQD Take 50 mg by mouth at bedtime as needed (sleep).      fluticasone (FLONASE) 50 MCG/ACT nasal spray Place 2 sprays into both nostrils daily as needed for allergies or rhinitis.     furosemide (LASIX) 20 MG tablet Take 20 mg by mouth daily as needed for fluid or edema.     levothyroxine (SYNTHROID, LEVOTHROID) 88 MCG tablet Take 88 mcg by mouth daily before breakfast.      meclizine (ANTIVERT) 25 MG tablet Take 12.5 mg by mouth 3 (three) times daily as needed for dizziness.     Multiple Vitamins-Minerals (ICAPS AREDS 2 PO) Take 1 capsule by mouth 2 (two) times daily.      olmesartan (BENICAR) 20 MG tablet Take 1 tablet (20 mg total) by mouth daily. 90 tablet 3   potassium chloride SA (KLOR-CON) 20 MEQ tablet Take 20 mEq by mouth daily as needed (when taking furosemide).     HYDROcodone-acetaminophen (NORCO/VICODIN) 5-325 MG tablet Take 1 tablet by mouth every 6 (six) hours as needed for moderate pain. (Patient not taking: Reported on 08/19/2019) 15 tablet 0   metoprolol tartrate (LOPRESSOR) 50 MG tablet Take 1 tablet (50 mg total) by mouth once for 1 dose. TAKE TWO HOUR PRIOR TO  SCHEDULE CARDAIC TEST (Patient not taking: Reported on 07/19/2019) 1 tablet 0   No current facility-administered medications for this encounter.     REVIEW OF SYSTEMS: As above  PHYSICAL EXAM:  vitals were not taken for this visit.    She is a good historian, in no acute distress  LABORATORY DATA:  Lab Results  Component Value Date   WBC 11.1 (H) 07/21/2019   HGB 12.7 07/21/2019   HCT 39.6 07/21/2019   MCV 95.7 07/21/2019     PLT 251 07/21/2019   CMP     Component Value Date/Time   NA 141 07/21/2019 1525   NA 140 05/10/2019 1457   K 4.4 07/21/2019 1525   CL 107 07/21/2019 1525   CO2 25 07/21/2019 1525   GLUCOSE 124 (H) 07/21/2019 1525   BUN 14 07/21/2019 1525   BUN 18 05/10/2019 1457   CREATININE 0.78 07/21/2019 1525   CALCIUM 9.2 07/21/2019 1525   PROT 6.6 07/21/2019 1525   ALBUMIN 4.1 07/21/2019 1525   AST 15 07/21/2019 1525   ALT 19 07/21/2019 1525   ALKPHOS 80 07/21/2019 1525   BILITOT 0.2 (L) 07/21/2019 1525   GFRNONAA >60 07/21/2019 1525   GFRAA >60 07/21/2019 1525         RADIOGRAPHY: Nm Sentinel Node Inj-no Rpt (breast)  Result Date: 07/27/2019 Sulfur colloid was injected by the nuclear medicine technologist for melanoma sentinel node.   Mm Breast  Surgical Specimen  Result Date: 07/27/2019 CLINICAL DATA:  Patient is post radioactive seed localization subsequent surgical excision of biopsy-proven malignancy left breast. EXAM: SPECIMEN RADIOGRAPH OF THE LEFT BREAST COMPARISON:  Previous exam(s). FINDINGS: Status post excision of the left breast. The radioactive seed and biopsy marker clip and adjacent group of microcalcifications are present, completely intact, and were marked for pathology. Results called to the OR at the time of dictation. IMPRESSION: Specimen radiograph of the left breast. Electronically Signed   By: Marin Olp M.D.   On: 07/27/2019 15:25   Mm Lt Radioactive Seed Loc Mammo Guide  Result Date: 07/27/2019 CLINICAL DATA:  Patient presents for radioactive seed localization prior to surgical excision of biopsy-proven malignancy over the upper inner left breast. EXAM: MAMMOGRAPHIC GUIDED RADIOACTIVE SEED LOCALIZATION OF THE LEFT BREAST COMPARISON:  Previous exam(s). FINDINGS: Patient presents for radioactive seed localization prior to surgical excision. I met with the patient and we discussed the procedure of seed localization including benefits and alternatives. We discussed  the high likelihood of a successful procedure. We discussed the risks of the procedure including infection, bleeding, tissue injury and further surgery. We discussed the low dose of radioactivity involved in the procedure. Informed, written consent was given. The usual time-out protocol was performed immediately prior to the procedure. Using mammographic guidance, sterile technique, 1% lidocaine and an I-125 radioactive seed, the targeted mass/ribbon clip was localized using a superior to inferior approach. The seed lies immediately anterior inferior to the clip at the biopsy site as the adjacent group of microcalcifications is 5 mm anterior to the seed. The follow-up mammogram images confirm the seed in the expected location and were marked for Dr. Brantley Stage. Follow-up survey of the patient confirms presence of the radioactive seed. Order number of I-125 seed:  [id number]. Total activity:  5.361 millicurie reference Date: 07/18/2019 The patient tolerated the procedure well and was released from the [facility]. She was given instructions regarding seed removal. IMPRESSION: Radioactive seed localization left breast. No apparent complications. Electronically Signed   By: Marin Olp M.D.   On: 07/27/2019 11:38      IMPRESSION/PLAN: Left Breast Cancer   It was a pleasure meeting the patient today. We discussed the risks, benefits, and side effects of radiotherapy. I recommend radiotherapy to the left breast to reduce her risk of locoregional recurrence by 2/3.  We discussed that radiation would take approximately 4 weeks to complete and that I would give the patient a few weeks to heal following surgery before starting treatment planning. We spoke about acute effects including skin irritation and fatigue as well as much less common late effects including internal organ injury or irritation. We spoke about the latest technology that is used to minimize the risk of late effects for patients undergoing radiotherapy  to the breast or chest wall. No guarantees of treatment were given. The patient is enthusiastic about proceeding with treatment. I look forward to participating in the patient's care.    I will order simulation to take place in about 2 weeks to give her some more time to heal and to resolve the hematoma.  Anticipate 40.05 Gy in 15 fractions to the left breast followed by boost of 10 Gy in 5 fractions to the lumpectomy cavity.   This encounter was provided by telemedicine platform Webex.  The patient has given verbal consent for this type of encounter and has been advised to only accept a meeting of this type in a secure network environment. The time  spent during this encounter was over 10 minutes. The attendants for this meeting include Eppie Gibson  and Edgardo Roys Casten.  During the encounter, Eppie Gibson was located at [facility] Radiation Oncology Department.  Edgardo Roys Nickolas was located at home.     __________________________________________   Eppie Gibson, MD   This document serves as a record of services personally performed by Eppie Gibson, MD. It was created on her behalf by Wilburn Mylar, a trained medical scribe. The creation of this record is based on the scribe's personal observations and the provider's statements to them. This document has been checked and approved by the attending provider.

## 2019-08-19 ENCOUNTER — Ambulatory Visit
Admission: RE | Admit: 2019-08-19 | Discharge: 2019-08-19 | Disposition: A | Discharge status: Home / Self Care | Payer: Medicare HMO | Source: Ambulatory Visit | Attending: Radiation Oncology | Admitting: Radiation Oncology

## 2019-08-19 ENCOUNTER — Other Ambulatory Visit: Payer: Self-pay

## 2019-08-19 ENCOUNTER — Encounter: Payer: Self-pay | Admitting: Radiation Oncology

## 2019-08-19 DIAGNOSIS — Z171 Estrogen receptor negative status [ER-]: Secondary | ICD-10-CM

## 2019-08-19 DIAGNOSIS — C50412 Malignant neoplasm of upper-outer quadrant of left female breast: Secondary | ICD-10-CM | POA: Diagnosis not present

## 2019-08-19 DIAGNOSIS — Z86018 Personal history of other benign neoplasm: Secondary | ICD-10-CM | POA: Diagnosis not present

## 2019-08-22 ENCOUNTER — Encounter: Payer: Self-pay | Admitting: *Deleted

## 2019-08-25 ENCOUNTER — Encounter: Payer: Self-pay | Admitting: *Deleted

## 2019-08-31 ENCOUNTER — Other Ambulatory Visit: Payer: Self-pay

## 2019-08-31 ENCOUNTER — Ambulatory Visit
Admission: RE | Admit: 2019-08-31 | Discharge: 2019-08-31 | Disposition: A | Discharge status: Home / Self Care | Payer: Medicare HMO | Source: Ambulatory Visit | Attending: Radiation Oncology | Admitting: Radiation Oncology

## 2019-08-31 DIAGNOSIS — Z171 Estrogen receptor negative status [ER-]: Secondary | ICD-10-CM | POA: Insufficient documentation

## 2019-08-31 DIAGNOSIS — Z510 Encounter for antineoplastic radiation therapy: Secondary | ICD-10-CM | POA: Insufficient documentation

## 2019-08-31 DIAGNOSIS — C50412 Malignant neoplasm of upper-outer quadrant of left female breast: Secondary | ICD-10-CM | POA: Diagnosis not present

## 2019-08-31 NOTE — Progress Notes (Signed)
  Radiation Oncology         [phone] ________________________________  Name: Alisha [name] MRN: [id number]  Date: 08/31/2019  DOB: [birth year]  SIMULATION AND TREATMENT PLANNING NOTE    outpatient  DIAGNOSIS:     ICD-10-CM   1. Malignant neoplasm of upper-outer quadrant of left breast in female, estrogen receptor negative ([place])  C50.412    Z17.1     NARRATIVE:  The patient was brought to the [facility].  Identity was confirmed.  All relevant records and images related to the planned course of therapy were reviewed.  The patient freely provided informed written consent to proceed with treatment after reviewing the details related to the planned course of therapy. The consent form was witnessed and verified by the simulation staff.    She continues to have serosanguineous fluid draining from her lumpectomy site.  She no longer has frank blood draining from that site.  The amount of drainage has reduced substantially.  The scar appears to have closed but there is still a little fluid seeping from it.  She has been applying pressure to her breast to try to push fluid out.  She has a follow-up with her surgeon in 5 days.  I advised her not to continue to put pressure on her breast so that the scar can close up and heal.  We will tentatively start her radiation in 7 days unless her surgeon has issue with that.  Then, the patient was set-up in a stable reproducible supine position for radiation therapy with her ipsilateral arm over her head, and her upper body secured in a custom-made Vac-lok device.  CT images were obtained.  Surface markings were placed.  The CT images were loaded into the planning software.    TREATMENT PLANNING NOTE: Treatment planning then occurred.  The radiation prescription was entered and confirmed.     A total of 3 medically necessary complex treatment devices were fabricated and supervised by me: 2 fields with MLCs for custom blocks to protect  heart, and lungs;  and, a Vac-lok. MORE COMPLEX DEVICES MAY BE MADE IN DOSIMETRY FOR FIELD IN FIELD BEAMS FOR DOSE HOMOGENEITY.  I have requested : 3D Simulation which is medically necessary to give adequate dose to at risk tissues while sparing lungs and heart.  I have requested a DVH of the following structures: lungs, heart, left lumpectomy cavity.    The patient will receive 40.05 Gy in 15 fractions to the left breast with 2 tangential fields.   This will be followed by a boost.  Optical Surface Tracking Plan:  Since intensity modulated radiotherapy (IMRT) and 3D conformal radiation treatment methods are predicated on accurate and precise positioning for treatment, intrafraction motion monitoring is medically necessary to ensure accurate and safe treatment delivery. The ability to quantify intrafraction motion without excessive ionizing radiation dose can only be performed with optical surface tracking. Accordingly, surface imaging offers the opportunity to obtain 3D measurements of patient position throughout IMRT and 3D treatments without excessive radiation exposure. I am ordering optical surface tracking for this patient's upcoming course of radiotherapy.  ________________________________   Reference:  Ursula Alert, J, et al. Surface imaging-based analysis of intrafraction motion for breast radiotherapy patients.Journal of [place], n. 6, nov. 2014. ISSN [id number].  Available at: <http://www.jacmp.org/index.php/jacmp/article/view/4957>.    -----------------------------------  Eppie Gibson, MD

## 2019-09-02 DIAGNOSIS — Z510 Encounter for antineoplastic radiation therapy: Secondary | ICD-10-CM | POA: Diagnosis not present

## 2019-09-02 DIAGNOSIS — C50412 Malignant neoplasm of upper-outer quadrant of left female breast: Secondary | ICD-10-CM | POA: Diagnosis not present

## 2019-09-02 DIAGNOSIS — Z171 Estrogen receptor negative status [ER-]: Secondary | ICD-10-CM | POA: Diagnosis not present

## 2019-09-05 ENCOUNTER — Other Ambulatory Visit: Payer: Self-pay | Admitting: *Deleted

## 2019-09-07 ENCOUNTER — Other Ambulatory Visit: Payer: Self-pay

## 2019-09-07 ENCOUNTER — Ambulatory Visit
Admission: RE | Admit: 2019-09-07 | Discharge: 2019-09-07 | Disposition: A | Discharge status: Home / Self Care | Payer: Medicare HMO | Source: Ambulatory Visit | Attending: Radiation Oncology | Admitting: Radiation Oncology

## 2019-09-07 DIAGNOSIS — Z510 Encounter for antineoplastic radiation therapy: Secondary | ICD-10-CM | POA: Diagnosis not present

## 2019-09-07 DIAGNOSIS — Z171 Estrogen receptor negative status [ER-]: Secondary | ICD-10-CM | POA: Diagnosis not present

## 2019-09-07 DIAGNOSIS — C50412 Malignant neoplasm of upper-outer quadrant of left female breast: Secondary | ICD-10-CM | POA: Diagnosis not present

## 2019-09-08 ENCOUNTER — Ambulatory Visit
Admission: RE | Admit: 2019-09-08 | Discharge: 2019-09-08 | Disposition: A | Discharge status: Home / Self Care | Payer: Medicare HMO | Source: Ambulatory Visit | Attending: Radiation Oncology | Admitting: Radiation Oncology

## 2019-09-08 ENCOUNTER — Other Ambulatory Visit: Payer: Self-pay

## 2019-09-08 DIAGNOSIS — C50412 Malignant neoplasm of upper-outer quadrant of left female breast: Secondary | ICD-10-CM | POA: Diagnosis not present

## 2019-09-08 DIAGNOSIS — Z171 Estrogen receptor negative status [ER-]: Secondary | ICD-10-CM | POA: Diagnosis not present

## 2019-09-08 DIAGNOSIS — Z510 Encounter for antineoplastic radiation therapy: Secondary | ICD-10-CM | POA: Diagnosis not present

## 2019-09-09 ENCOUNTER — Other Ambulatory Visit: Payer: Self-pay

## 2019-09-09 ENCOUNTER — Ambulatory Visit
Admission: RE | Admit: 2019-09-09 | Discharge: 2019-09-09 | Disposition: A | Discharge status: Home / Self Care | Payer: Medicare HMO | Source: Ambulatory Visit | Attending: Radiation Oncology | Admitting: Radiation Oncology

## 2019-09-09 DIAGNOSIS — Z171 Estrogen receptor negative status [ER-]: Secondary | ICD-10-CM | POA: Diagnosis not present

## 2019-09-09 DIAGNOSIS — Z510 Encounter for antineoplastic radiation therapy: Secondary | ICD-10-CM | POA: Diagnosis not present

## 2019-09-09 DIAGNOSIS — C50412 Malignant neoplasm of upper-outer quadrant of left female breast: Secondary | ICD-10-CM | POA: Diagnosis not present

## 2019-09-12 ENCOUNTER — Other Ambulatory Visit: Payer: Self-pay

## 2019-09-12 ENCOUNTER — Ambulatory Visit
Admission: RE | Admit: 2019-09-12 | Discharge: 2019-09-12 | Disposition: A | Discharge status: Home / Self Care | Payer: Medicare HMO | Source: Ambulatory Visit | Attending: Radiation Oncology | Admitting: Radiation Oncology

## 2019-09-12 DIAGNOSIS — Z171 Estrogen receptor negative status [ER-]: Secondary | ICD-10-CM | POA: Diagnosis not present

## 2019-09-12 DIAGNOSIS — C50412 Malignant neoplasm of upper-outer quadrant of left female breast: Secondary | ICD-10-CM | POA: Diagnosis not present

## 2019-09-12 DIAGNOSIS — Z510 Encounter for antineoplastic radiation therapy: Secondary | ICD-10-CM | POA: Diagnosis not present

## 2019-09-12 MED ORDER — ALRA NON-METALLIC DEODORANT (RAD-ONC)
1.0000 "application " | Freq: Once | TOPICAL | Status: AC
  Administered 2019-09-12: 1 via TOPICAL

## 2019-09-12 MED ORDER — SONAFINE EX EMUL
1.0000 "application " | Freq: Once | CUTANEOUS | Status: AC
  Administered 2019-09-12: 1 via TOPICAL

## 2019-09-12 NOTE — Progress Notes (Signed)
Pt here for patient teaching.  Pt given Radiation and You booklet, skin care instructions, Alra deodorant and Sonafine.  Reviewed areas of pertinence such as fatigue, skin changes, breast tenderness and breast swelling . Pt able to give teach back of to pat skin, use unscented/gentle soap and drink plenty of water,apply Sonafine bid, avoid applying anything to skin within 4 hours of treatment, avoid wearing an under wire bra and to use an electric razor if they must shave. Pt verbalizes understanding of information given and will contact nursing with any questions or concerns.     Http://rtanswers.org/treatmentinformation/whattoexpect/index

## 2019-09-13 ENCOUNTER — Ambulatory Visit
Admission: RE | Admit: 2019-09-13 | Discharge: 2019-09-13 | Disposition: A | Discharge status: Home / Self Care | Payer: Medicare HMO | Source: Ambulatory Visit | Attending: Radiation Oncology | Admitting: Radiation Oncology

## 2019-09-13 ENCOUNTER — Other Ambulatory Visit: Payer: Self-pay

## 2019-09-13 DIAGNOSIS — C50412 Malignant neoplasm of upper-outer quadrant of left female breast: Secondary | ICD-10-CM | POA: Diagnosis not present

## 2019-09-13 DIAGNOSIS — Z171 Estrogen receptor negative status [ER-]: Secondary | ICD-10-CM | POA: Diagnosis not present

## 2019-09-13 DIAGNOSIS — Z510 Encounter for antineoplastic radiation therapy: Secondary | ICD-10-CM | POA: Diagnosis not present

## 2019-09-14 ENCOUNTER — Ambulatory Visit
Admission: RE | Admit: 2019-09-14 | Discharge: 2019-09-14 | Disposition: A | Discharge status: Home / Self Care | Payer: Medicare HMO | Source: Ambulatory Visit | Attending: Radiation Oncology | Admitting: Radiation Oncology

## 2019-09-14 ENCOUNTER — Other Ambulatory Visit: Payer: Self-pay

## 2019-09-14 DIAGNOSIS — Z510 Encounter for antineoplastic radiation therapy: Secondary | ICD-10-CM | POA: Diagnosis not present

## 2019-09-14 DIAGNOSIS — Z171 Estrogen receptor negative status [ER-]: Secondary | ICD-10-CM | POA: Diagnosis not present

## 2019-09-14 DIAGNOSIS — C50412 Malignant neoplasm of upper-outer quadrant of left female breast: Secondary | ICD-10-CM | POA: Diagnosis not present

## 2019-09-19 ENCOUNTER — Other Ambulatory Visit: Payer: Self-pay

## 2019-09-19 ENCOUNTER — Ambulatory Visit
Admission: RE | Admit: 2019-09-19 | Discharge: 2019-09-19 | Disposition: A | Discharge status: Home / Self Care | Payer: Medicare HMO | Source: Ambulatory Visit | Attending: Radiation Oncology | Admitting: Radiation Oncology

## 2019-09-19 DIAGNOSIS — C50412 Malignant neoplasm of upper-outer quadrant of left female breast: Secondary | ICD-10-CM | POA: Diagnosis not present

## 2019-09-19 DIAGNOSIS — Z510 Encounter for antineoplastic radiation therapy: Secondary | ICD-10-CM | POA: Diagnosis not present

## 2019-09-19 DIAGNOSIS — Z171 Estrogen receptor negative status [ER-]: Secondary | ICD-10-CM | POA: Diagnosis not present

## 2019-09-20 ENCOUNTER — Other Ambulatory Visit: Payer: Self-pay

## 2019-09-20 ENCOUNTER — Ambulatory Visit
Admission: RE | Admit: 2019-09-20 | Discharge: 2019-09-20 | Disposition: A | Discharge status: Home / Self Care | Payer: Medicare HMO | Source: Ambulatory Visit | Attending: Radiation Oncology | Admitting: Radiation Oncology

## 2019-09-20 DIAGNOSIS — Z171 Estrogen receptor negative status [ER-]: Secondary | ICD-10-CM | POA: Diagnosis not present

## 2019-09-20 DIAGNOSIS — C50412 Malignant neoplasm of upper-outer quadrant of left female breast: Secondary | ICD-10-CM | POA: Insufficient documentation

## 2019-09-20 DIAGNOSIS — Z510 Encounter for antineoplastic radiation therapy: Secondary | ICD-10-CM | POA: Insufficient documentation

## 2019-09-21 ENCOUNTER — Other Ambulatory Visit: Payer: Self-pay

## 2019-09-21 ENCOUNTER — Ambulatory Visit
Admission: RE | Admit: 2019-09-21 | Discharge: 2019-09-21 | Disposition: A | Discharge status: Home / Self Care | Payer: Medicare HMO | Source: Ambulatory Visit | Attending: Radiation Oncology | Admitting: Radiation Oncology

## 2019-09-21 DIAGNOSIS — Z510 Encounter for antineoplastic radiation therapy: Secondary | ICD-10-CM | POA: Diagnosis not present

## 2019-09-21 DIAGNOSIS — C50412 Malignant neoplasm of upper-outer quadrant of left female breast: Secondary | ICD-10-CM | POA: Diagnosis not present

## 2019-09-21 DIAGNOSIS — Z171 Estrogen receptor negative status [ER-]: Secondary | ICD-10-CM | POA: Diagnosis not present

## 2019-09-22 ENCOUNTER — Other Ambulatory Visit: Payer: Self-pay

## 2019-09-22 ENCOUNTER — Ambulatory Visit
Admission: RE | Admit: 2019-09-22 | Discharge: 2019-09-22 | Disposition: A | Discharge status: Home / Self Care | Payer: Medicare HMO | Source: Ambulatory Visit | Attending: Radiation Oncology | Admitting: Radiation Oncology

## 2019-09-22 DIAGNOSIS — C50412 Malignant neoplasm of upper-outer quadrant of left female breast: Secondary | ICD-10-CM | POA: Diagnosis not present

## 2019-09-22 DIAGNOSIS — Z171 Estrogen receptor negative status [ER-]: Secondary | ICD-10-CM | POA: Diagnosis not present

## 2019-09-22 DIAGNOSIS — Z510 Encounter for antineoplastic radiation therapy: Secondary | ICD-10-CM | POA: Diagnosis not present

## 2019-09-23 ENCOUNTER — Other Ambulatory Visit: Payer: Self-pay

## 2019-09-23 ENCOUNTER — Ambulatory Visit
Admission: RE | Admit: 2019-09-23 | Discharge: 2019-09-23 | Disposition: A | Discharge status: Home / Self Care | Payer: Medicare HMO | Source: Ambulatory Visit | Attending: Radiation Oncology | Admitting: Radiation Oncology

## 2019-09-23 DIAGNOSIS — C50919 Malignant neoplasm of unspecified site of unspecified female breast: Secondary | ICD-10-CM | POA: Diagnosis not present

## 2019-09-23 DIAGNOSIS — I10 Essential (primary) hypertension: Secondary | ICD-10-CM | POA: Diagnosis not present

## 2019-09-23 DIAGNOSIS — C50412 Malignant neoplasm of upper-outer quadrant of left female breast: Secondary | ICD-10-CM | POA: Diagnosis not present

## 2019-09-23 DIAGNOSIS — Z510 Encounter for antineoplastic radiation therapy: Secondary | ICD-10-CM | POA: Diagnosis not present

## 2019-09-23 DIAGNOSIS — Z171 Estrogen receptor negative status [ER-]: Secondary | ICD-10-CM | POA: Diagnosis not present

## 2019-09-23 DIAGNOSIS — E039 Hypothyroidism, unspecified: Secondary | ICD-10-CM | POA: Diagnosis not present

## 2019-09-26 ENCOUNTER — Ambulatory Visit
Admission: RE | Admit: 2019-09-26 | Discharge: 2019-09-26 | Disposition: A | Discharge status: Home / Self Care | Payer: Medicare HMO | Source: Ambulatory Visit | Attending: Radiation Oncology | Admitting: Radiation Oncology

## 2019-09-26 ENCOUNTER — Other Ambulatory Visit: Payer: Self-pay

## 2019-09-26 DIAGNOSIS — C50412 Malignant neoplasm of upper-outer quadrant of left female breast: Secondary | ICD-10-CM | POA: Diagnosis not present

## 2019-09-26 DIAGNOSIS — Z171 Estrogen receptor negative status [ER-]: Secondary | ICD-10-CM | POA: Diagnosis not present

## 2019-09-26 DIAGNOSIS — Z510 Encounter for antineoplastic radiation therapy: Secondary | ICD-10-CM | POA: Diagnosis not present

## 2019-09-27 ENCOUNTER — Other Ambulatory Visit: Payer: Self-pay

## 2019-09-27 ENCOUNTER — Ambulatory Visit
Admission: RE | Admit: 2019-09-27 | Discharge: 2019-09-27 | Disposition: A | Discharge status: Home / Self Care | Payer: Medicare HMO | Source: Ambulatory Visit | Attending: Radiation Oncology | Admitting: Radiation Oncology

## 2019-09-27 DIAGNOSIS — Z510 Encounter for antineoplastic radiation therapy: Secondary | ICD-10-CM | POA: Diagnosis not present

## 2019-09-27 DIAGNOSIS — C50412 Malignant neoplasm of upper-outer quadrant of left female breast: Secondary | ICD-10-CM | POA: Diagnosis not present

## 2019-09-27 DIAGNOSIS — Z171 Estrogen receptor negative status [ER-]: Secondary | ICD-10-CM | POA: Diagnosis not present

## 2019-09-28 ENCOUNTER — Ambulatory Visit
Admission: RE | Admit: 2019-09-28 | Discharge: 2019-09-28 | Disposition: A | Discharge status: Home / Self Care | Payer: Medicare HMO | Source: Ambulatory Visit | Attending: Radiation Oncology | Admitting: Radiation Oncology

## 2019-09-28 ENCOUNTER — Other Ambulatory Visit: Payer: Self-pay

## 2019-09-28 DIAGNOSIS — Z171 Estrogen receptor negative status [ER-]: Secondary | ICD-10-CM | POA: Diagnosis not present

## 2019-09-28 DIAGNOSIS — Z510 Encounter for antineoplastic radiation therapy: Secondary | ICD-10-CM | POA: Diagnosis not present

## 2019-09-28 DIAGNOSIS — C50412 Malignant neoplasm of upper-outer quadrant of left female breast: Secondary | ICD-10-CM | POA: Diagnosis not present

## 2019-09-29 ENCOUNTER — Other Ambulatory Visit: Payer: Self-pay

## 2019-09-29 ENCOUNTER — Ambulatory Visit
Admission: RE | Admit: 2019-09-29 | Discharge: 2019-09-29 | Disposition: A | Discharge status: Home / Self Care | Payer: Medicare HMO | Source: Ambulatory Visit | Attending: Radiation Oncology | Admitting: Radiation Oncology

## 2019-09-29 DIAGNOSIS — Z171 Estrogen receptor negative status [ER-]: Secondary | ICD-10-CM | POA: Diagnosis not present

## 2019-09-29 DIAGNOSIS — C50412 Malignant neoplasm of upper-outer quadrant of left female breast: Secondary | ICD-10-CM | POA: Diagnosis not present

## 2019-09-29 DIAGNOSIS — Z510 Encounter for antineoplastic radiation therapy: Secondary | ICD-10-CM | POA: Diagnosis not present

## 2019-09-30 ENCOUNTER — Ambulatory Visit
Admission: RE | Admit: 2019-09-30 | Discharge: 2019-09-30 | Disposition: A | Discharge status: Home / Self Care | Payer: Medicare HMO | Source: Ambulatory Visit | Attending: Radiation Oncology | Admitting: Radiation Oncology

## 2019-09-30 ENCOUNTER — Other Ambulatory Visit: Payer: Self-pay

## 2019-09-30 DIAGNOSIS — C50412 Malignant neoplasm of upper-outer quadrant of left female breast: Secondary | ICD-10-CM | POA: Diagnosis not present

## 2019-09-30 DIAGNOSIS — Z171 Estrogen receptor negative status [ER-]: Secondary | ICD-10-CM | POA: Diagnosis not present

## 2019-09-30 DIAGNOSIS — Z510 Encounter for antineoplastic radiation therapy: Secondary | ICD-10-CM | POA: Diagnosis not present

## 2019-09-30 DIAGNOSIS — H524 Presbyopia: Secondary | ICD-10-CM | POA: Diagnosis not present

## 2019-10-03 ENCOUNTER — Ambulatory Visit
Admission: RE | Admit: 2019-10-03 | Discharge: 2019-10-03 | Disposition: A | Discharge status: Home / Self Care | Payer: Medicare HMO | Source: Ambulatory Visit | Attending: Radiation Oncology | Admitting: Radiation Oncology

## 2019-10-03 ENCOUNTER — Other Ambulatory Visit: Payer: Self-pay

## 2019-10-03 DIAGNOSIS — H524 Presbyopia: Secondary | ICD-10-CM | POA: Diagnosis not present

## 2019-10-03 DIAGNOSIS — Z510 Encounter for antineoplastic radiation therapy: Secondary | ICD-10-CM | POA: Diagnosis not present

## 2019-10-03 DIAGNOSIS — C50412 Malignant neoplasm of upper-outer quadrant of left female breast: Secondary | ICD-10-CM

## 2019-10-03 DIAGNOSIS — Z171 Estrogen receptor negative status [ER-]: Secondary | ICD-10-CM

## 2019-10-03 MED ORDER — SONAFINE EX EMUL
1.0000 "application " | Freq: Once | CUTANEOUS | Status: AC
  Administered 2019-10-03: 1 via TOPICAL

## 2019-10-04 ENCOUNTER — Ambulatory Visit
Admission: RE | Admit: 2019-10-04 | Discharge: 2019-10-04 | Disposition: A | Discharge status: Home / Self Care | Payer: Medicare HMO | Source: Ambulatory Visit | Attending: Radiation Oncology | Admitting: Radiation Oncology

## 2019-10-04 ENCOUNTER — Other Ambulatory Visit: Payer: Self-pay

## 2019-10-04 DIAGNOSIS — C50412 Malignant neoplasm of upper-outer quadrant of left female breast: Secondary | ICD-10-CM | POA: Diagnosis not present

## 2019-10-04 DIAGNOSIS — Z171 Estrogen receptor negative status [ER-]: Secondary | ICD-10-CM | POA: Diagnosis not present

## 2019-10-04 DIAGNOSIS — Z510 Encounter for antineoplastic radiation therapy: Secondary | ICD-10-CM | POA: Diagnosis not present

## 2019-10-05 ENCOUNTER — Other Ambulatory Visit: Payer: Self-pay

## 2019-10-05 ENCOUNTER — Ambulatory Visit
Admission: RE | Admit: 2019-10-05 | Discharge: 2019-10-05 | Disposition: A | Discharge status: Home / Self Care | Payer: Medicare HMO | Source: Ambulatory Visit | Attending: Radiation Oncology | Admitting: Radiation Oncology

## 2019-10-05 DIAGNOSIS — C50412 Malignant neoplasm of upper-outer quadrant of left female breast: Secondary | ICD-10-CM | POA: Diagnosis not present

## 2019-10-05 DIAGNOSIS — Z510 Encounter for antineoplastic radiation therapy: Secondary | ICD-10-CM | POA: Diagnosis not present

## 2019-10-05 DIAGNOSIS — Z171 Estrogen receptor negative status [ER-]: Secondary | ICD-10-CM | POA: Diagnosis not present

## 2019-10-06 ENCOUNTER — Other Ambulatory Visit: Payer: Self-pay

## 2019-10-06 ENCOUNTER — Encounter: Payer: Self-pay | Admitting: Radiation Oncology

## 2019-10-06 ENCOUNTER — Ambulatory Visit
Admission: RE | Admit: 2019-10-06 | Discharge: 2019-10-06 | Disposition: A | Discharge status: Home / Self Care | Payer: Medicare HMO | Source: Ambulatory Visit | Attending: Radiation Oncology | Admitting: Radiation Oncology

## 2019-10-06 ENCOUNTER — Encounter: Payer: Self-pay | Admitting: *Deleted

## 2019-10-06 DIAGNOSIS — C50412 Malignant neoplasm of upper-outer quadrant of left female breast: Secondary | ICD-10-CM | POA: Diagnosis not present

## 2019-10-06 DIAGNOSIS — Z171 Estrogen receptor negative status [ER-]: Secondary | ICD-10-CM | POA: Diagnosis not present

## 2019-10-06 DIAGNOSIS — Z510 Encounter for antineoplastic radiation therapy: Secondary | ICD-10-CM | POA: Diagnosis not present

## 2019-10-11 ENCOUNTER — Encounter: Payer: Self-pay | Admitting: *Deleted

## 2019-10-18 DIAGNOSIS — H353132 Nonexudative age-related macular degeneration, bilateral, intermediate dry stage: Secondary | ICD-10-CM | POA: Diagnosis not present

## 2019-10-18 DIAGNOSIS — H35033 Hypertensive retinopathy, bilateral: Secondary | ICD-10-CM | POA: Diagnosis not present

## 2019-10-18 DIAGNOSIS — H43813 Vitreous degeneration, bilateral: Secondary | ICD-10-CM | POA: Diagnosis not present

## 2019-10-18 DIAGNOSIS — H402231 Chronic angle-closure glaucoma, bilateral, mild stage: Secondary | ICD-10-CM | POA: Diagnosis not present

## 2019-10-31 ENCOUNTER — Ambulatory Visit: Payer: Self-pay | Admitting: Podiatry

## 2019-11-03 ENCOUNTER — Ambulatory Visit (INDEPENDENT_AMBULATORY_CARE_PROVIDER_SITE_OTHER): Payer: Medicare HMO

## 2019-11-03 ENCOUNTER — Other Ambulatory Visit: Payer: Self-pay

## 2019-11-03 ENCOUNTER — Ambulatory Visit: Payer: Medicare HMO | Admitting: Podiatry

## 2019-11-03 DIAGNOSIS — M79674 Pain in right toe(s): Secondary | ICD-10-CM | POA: Diagnosis not present

## 2019-11-03 DIAGNOSIS — M2041 Other hammer toe(s) (acquired), right foot: Secondary | ICD-10-CM

## 2019-11-03 DIAGNOSIS — B351 Tinea unguium: Secondary | ICD-10-CM | POA: Diagnosis not present

## 2019-11-03 DIAGNOSIS — M779 Enthesopathy, unspecified: Secondary | ICD-10-CM

## 2019-11-03 DIAGNOSIS — M79675 Pain in left toe(s): Secondary | ICD-10-CM

## 2019-11-03 DIAGNOSIS — G629 Polyneuropathy, unspecified: Secondary | ICD-10-CM | POA: Diagnosis not present

## 2019-11-03 DIAGNOSIS — M2042 Other hammer toe(s) (acquired), left foot: Secondary | ICD-10-CM | POA: Diagnosis not present

## 2019-11-03 MED ORDER — GABAPENTIN 100 MG PO CAPS: 100.0000 mg | ORAL_CAPSULE | Freq: Every day | ORAL | 0 refills | Status: DC

## 2019-11-03 NOTE — Patient Instructions (Addendum)

## 2019-11-07 NOTE — Progress Notes (Signed)
I called the patient today about her upcoming follow-up appointment in radiation oncology.   Given the state of the COVID-19 pandemic, concerning case numbers in our community, and guidance from [facility], I offered a phone assessment with the patient to determine if coming to the clinic was necessary. She accepted.  I let the patient know that I had spoken with Dr. Isidore Moos, and she wanted them to know the importance of washing their hands for at least 20 seconds at a time, especially after going out in public, and before they eat.  Limit going out in public whenever possible. Do not touch your face, unless your hands are clean, such as when bathing. Get plenty of rest, eat well, and stay hydrated.   The patient denies any symptomatic concerns.  Specifically, they report good healing of their skin in the radiation fields.  Skin is intact.    I recommended that she continue skin care by applying oil or lotion with vitamin E to the skin in the radiation fields, BID, for 2 more months.  Continue follow-up with medical oncology - follow-up is scheduled on 02/13/20 with Dr. Jana Hakim .  I explained that yearly mammograms are important for patients with intact breast tissue, and physical exams are important after mastectomy for patients that cannot undergo mammography.  I encouraged her to call if she had further questions or concerns about her healing. Otherwise, she will follow-up PRN in radiation oncology. Patient is pleased with this plan, and we will cancel her upcoming follow-up to reduce the risk of COVID-19 transmission.

## 2019-11-08 ENCOUNTER — Inpatient Hospital Stay
Admission: RE | Admit: 2019-11-08 | Discharge: 2019-11-08 | Disposition: A | Discharge status: Home / Self Care | Payer: Self-pay | Source: Ambulatory Visit | Attending: Radiation Oncology | Admitting: Radiation Oncology
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
## 2019-11-12 NOTE — Progress Notes (Signed)
  Patient Name: Alisha [name] MRN: [id number] DOB: April 14, 1935 Referring Physician: Erroll Luna (Profile Not Attached) Date of Service: 10/06/2019 [facility], [place]                                                        End Of Treatment Note  Diagnoses: C50.412-Malignant neoplasm of upper-outer quadrant of left female breast  Cancer Staging: Cancer Staging Malignant neoplasm of upper-outer quadrant of left breast in female, estrogen receptor negative ([place]) Staging form: Breast, AJCC 8th Edition - Clinical stage from 06/22/2019: Stage IB (cT1b, cN0, cM0, G3, ER-, PR-, HER2-) - Signed by Gardenia Phlegm, NP on 06/29/2019 pT1b, pN0  Intent: Curative  Radiation Treatment Dates: 09/07/2019 through 10/06/2019 Site Technique Total Dose (Gy) Dose per Fx (Gy) Completed Fx Beam Energies  Breast, Left: Breast_Lt 3D 40.05/40.05 2.67 15/15 6X, 10X  Breast, Left: Breast_Lt_Bst specialPort 10/10 2 5/5 15E   Narrative: The patient tolerated radiation therapy relatively well.   Plan: The patient will follow-up with radiation oncology in 26mo or as needed.  -----------------------------------  SEppie Gibson MD

## 2019-11-15 ENCOUNTER — Other Ambulatory Visit: Payer: Self-pay | Admitting: Oncology

## 2019-11-15 DIAGNOSIS — Z7189 Other specified counseling: Secondary | ICD-10-CM | POA: Diagnosis not present

## 2019-11-15 DIAGNOSIS — E039 Hypothyroidism, unspecified: Secondary | ICD-10-CM | POA: Diagnosis not present

## 2019-11-15 NOTE — Progress Notes (Signed)
Radiation Treatment Dates: 09/07/2019 through 10/06/2019 Site Technique Total Dose (Gy) Dose per Fx (Gy) Completed Fx Beam Energies  Breast, Left: Breast_Lt 3D 40.05/40.05 2.67 15/15 6X, 10X  Breast, Left: Breast_Lt_Bst specialPort 10/10 2 5/5 15E

## 2019-11-21 ENCOUNTER — Ambulatory Visit: Payer: Medicare HMO | Attending: Internal Medicine

## 2019-11-21 DIAGNOSIS — Z20822 Contact with and (suspected) exposure to covid-19: Secondary | ICD-10-CM

## 2019-11-22 LAB — NOVEL CORONAVIRUS, NAA: SARS-CoV-2, NAA: DETECTED — AB

## 2019-11-25 ENCOUNTER — Telehealth: Payer: Self-pay | Admitting: Physician Assistant

## 2019-11-25 NOTE — Telephone Encounter (Signed)
I was called by Sadie Haber on my cell phone about a woman asking for orders for a  Monoclonal antibody infusion. There is no documentation of [place] calling this pt, but it looks like per chart review that she would qualify for the mAB infusion. I tried reaching out to the pt several times but no one answered. I did leave a message to call back and gave the hotline number.   Angelena Form PA-C  [facility]

## 2019-11-26 DIAGNOSIS — I10 Essential (primary) hypertension: Secondary | ICD-10-CM | POA: Diagnosis not present

## 2019-11-26 DIAGNOSIS — R197 Diarrhea, unspecified: Secondary | ICD-10-CM | POA: Diagnosis not present

## 2019-11-26 DIAGNOSIS — R531 Weakness: Secondary | ICD-10-CM | POA: Diagnosis not present

## 2019-11-26 DIAGNOSIS — R05 Cough: Secondary | ICD-10-CM | POA: Diagnosis not present

## 2019-11-26 DIAGNOSIS — R0902 Hypoxemia: Secondary | ICD-10-CM | POA: Diagnosis not present

## 2019-12-01 ENCOUNTER — Ambulatory Visit: Payer: Medicare HMO | Admitting: Podiatry

## 2019-12-05 ENCOUNTER — Ambulatory Visit: Payer: Medicare HMO | Attending: Internal Medicine

## 2019-12-05 DIAGNOSIS — Z20822 Contact with and (suspected) exposure to covid-19: Secondary | ICD-10-CM

## 2019-12-06 LAB — NOVEL CORONAVIRUS, NAA: SARS-CoV-2, NAA: NOT DETECTED

## 2019-12-19 ENCOUNTER — Ambulatory Visit: Payer: Medicare HMO | Admitting: Podiatry

## 2020-02-12 NOTE — Progress Notes (Signed)
[place]  [phone] [phone]     ID: Alisha [name] DOB: 1935-01-11  MR#: [id number]  IRS#:[id number]  Patient Care Team: [name], MD as PCP - General (Family Medicine) Mauro Kaufmann, RN as Oncology Nurse Navigator Rockwell Germany, RN as Oncology Nurse Navigator Erroll Luna, MD as Consulting Physician (General Surgery) Eppie Gibson, MD as Attending Physician (Radiation Oncology) Leonie Man, MD as Consulting Physician (Cardiology) Chauncey Cruel, MD OTHER MD:  CHIEF COMPLAINT: triple negative breast cancer  CURRENT TREATMENT: Observation   INTERVAL HISTORY: Alisha [name] returns today for follow up of her triple negative breast cancer.  Since her last visit, she received adjuvant radiation therapy from 09/07/2019 through 10/06/2019 under Dr. Isidore Moos.  She tolerated that generally well, except for mild to moderate fatigue.  She tested positive for Covid-19 on 11/21/2019.  She was extremely fatigued, barely able to get to the grocery store and back.  She contacted her nephew who lives out of town and he suggested she call EMS.  They did evaluate her and told her she did not need to go to the hospital.  Subsequent testing on 12/05/2019 was negative.  She also has received both Pfizer vaccine doses since that time  Her most recent bone density screening was performed on 07/06/2018 and showed a T-score of 1.0.   REVIEW OF SYSTEMS: Alisha [name] still feels somewhat tired, but less so than before.  She did not lose her sense of smell with the Covid infection but she did lose her taste.  She also had problems with insomnia.  She is now beginning to exercise a little bit more and is beginning to feel a little bit better.  She developed some burning on her feet and was started on gabapentin but found that that did not help and stop that medication.  A detailed review of systems today was otherwise stable   HISTORY OF CURRENT  ILLNESS: From the original intake note:  Alisha [name] was noted to have a left breast upper-outer quadrant distortion on screening mammogram in 2017. She proceeded to biopsy ([id number]), which showed a complex sclerosing lesion. She opted to undergo left lumpectomy ([id number]) on 08/11/2016 under Dr. Brantley Stage, which revealed a radial scar with calcifications.   She presented for routine screening mammogram, which showed a possible mass and adjacent calcifications in the left breast. She was referred to [facility] for further imaging on 06/16/2019. Physical exam performed that day showed a palpable soft thickening in the upper central aspect of the left breast. She underwent left diagnostic mammography with tomography and ultrasonography showing: breast density category B; suspicious 7 mm mass in the 12 o'clock location of the left breast; small indeterminate group of calcifications 4 mm anterior to the mass warrant excision if the mass is positive for malignancy; left axilla negative for adenopathy.  Accordingly on 06/22/2019 she proceeded to biopsy of the left breast area in question. The pathology from this procedure ([id number]) showed: invasive ductal carcinoma, grade 3. Prognostic indicators significant for: estrogen receptor, 0% negative and progesterone receptor, 0% negative. Proliferation marker Ki67 at 30%. HER2 negative by immunohistochemistry (0).  The patient's subsequent history is as detailed below.   PAST MEDICAL HISTORY: Past Medical History:  Diagnosis Date  . Arthritis   . Asthma    Never smoker  . Blood transfusion    over 40 y ears  . Breast mass, left   . Cancer ([place])   . History of radiation therapy  09/07/19- 10/06/19   Left Breast 15 fractions of 2.67 Gy to total 40.05 Gy. Left breast boost 5 fractions of 2 Gy each to total 10 Gy  . Hypertension    on meds  . Hypothyroidism    on meds  . Pre-diabetes     PAST SURGICAL HISTORY: Past Surgical  History:  Procedure Laterality Date  . ABDOMINAL HYSTERECTOMY     32 yrs  . BREAST LUMPECTOMY WITH RADIOACTIVE SEED AND SENTINEL LYMPH NODE BIOPSY Left 07/27/2019   Procedure: LEFT BREAST LUMPECTOMY WITH RADIOACTIVE SEED AND LEFT SENTINEL LYMPH NODE MAPPING;  Surgeon: Erroll Luna, MD;  Location: [facility];  Service: General;  Laterality: Left;  . BREAST LUMPECTOMY WITH RADIOACTIVE SEED LOCALIZATION Left 08/11/2016   Procedure: LEFT BREAST LUMPECTOMY WITH RADIOACTIVE SEED LOCALIZATION;  Surgeon: Erroll Luna, MD;  Location: [facility];  Service: General;  Laterality: Left;  . CATARACT EXTRACTION, BILATERAL    . EYE SURGERY    . JOINT REPLACEMENT Bilateral    left knee 2012  . KNEE ARTHROPLASTY  02/17/2012   Procedure: COMPUTER ASSISTED TOTAL KNEE ARTHROPLASTY;  Surgeon: Meredith Pel, MD;  Location: [facility];  Service: Orthopedics;  Laterality: Right;  Right total knee replacement    FAMILY HISTORY: Family History  Problem Relation Age of Onset  . Other Mother        died @ 20 - but was healthy  . Hypertension Sister   . Diabetes Sister   . Diabetes Brother        no longer on medications.   . Anesthesia problems Neg Hx   . Hypotension Neg Hx   . Malignant hyperthermia Neg Hx   . Pseudochol deficiency Neg Hx    Patient's father was in his mid 63s years old when he died from unknown causes. Patient's mother died from "natural causes" at age 65. The patient denies a family hx of breast or ovarian cancer. She has 5 brothers and 1 sister.   GYNECOLOGIC HISTORY:  No LMP recorded. Patient is postmenopausal. 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  She is now retired.  She describes herself a single.   Her daughter Alisha [name] lives in [place].  She has multiple medical problems.  The patient's son died at age 68.  The patient has 1 grandchild, Johnny Bridge, who lives in [place] and works for [employer].  He has 4 children.  The patient herself attends a local [employer] DIRECTIVES: The patient intends to name her grandson Johnny Bridge as her healthcare power of attorney.  He can be reached at [phone].  She was given the appropriate documents to complete and notarize at her discretion at the time of her 07/21/2019 visit   HEALTH MAINTENANCE: Social History   Tobacco Use  . Smoking status: Never Smoker  . Smokeless tobacco: Never Used  Substance Use Topics  . Alcohol use: No  . Drug use: No     Allergies  Allergen Reactions  . Lisinopril Itching    No energy  . Penicillins Itching and Rash    Has patient had a PCN reaction causing immediate rash, facial/tongue/throat swelling, SOB or lightheadedness with hypotension: No Has patient had  a PCN reaction causing severe rash involving mucus membranes or skin necrosis: No Has patient had a PCN reaction that required hospitalization: No Has patient had a PCN reaction occurring within the last 10 years: No If all of the above answers are "NO", then may proceed with Cephalosporin use.  . Sulfa Antibiotics Itching and Rash    Current Outpatient Medications  Medication Sig Dispense Refill  . acetaminophen (TYLENOL) 500 MG tablet Take 1,000 mg by mouth every 6 (six) hours as needed for moderate pain.     Marland Kitchen albuterol (PROVENTIL HFA;VENTOLIN HFA) 108 (90 Base) MCG/ACT inhaler Inhale 1-2 puffs into the lungs every 6 (six) hours as needed for wheezing or shortness of breath.     . Artificial Tear Solution (SOOTHE XP OP) Place 1 drop into both eyes 3 (three) times daily as needed (dry or irritated eyes).    Marland Kitchen atenolol (TENORMIN) 25 MG tablet Take 1 tablet (25 mg total) by mouth daily. 30  tablet 6  . Biotin 5 MG TABS Take 5 mg by mouth daily.     . cetaphil (CETAPHIL) cream Apply 1 application topically 2 (two) times daily.     . diphenhydrAMINE HCl (ZZZQUIL) 50 MG/30ML LIQD Take 50 mg by mouth at bedtime as needed (sleep).     . fluticasone (FLONASE) 50 MCG/ACT nasal spray Place 2 sprays into both nostrils daily as needed for allergies or rhinitis.    . furosemide (LASIX) 20 MG tablet Take 20 mg by mouth daily as needed for fluid or edema.    . gabapentin (NEURONTIN) 100 MG capsule Take 1 capsule (100 mg total) by mouth at bedtime. 90 capsule 0  . HYDROcodone-acetaminophen (NORCO/VICODIN) 5-325 MG tablet Take 1 tablet by mouth every 6 (six) hours as needed for moderate pain. (Patient not taking: Reported on 08/19/2019) 15 tablet 0  . levothyroxine (SYNTHROID, LEVOTHROID) 88 MCG tablet Take 88 mcg by mouth daily before breakfast.     . meclizine (ANTIVERT) 25 MG tablet Take 12.5 mg by mouth 3 (three) times daily as needed for dizziness.    . metoprolol tartrate (LOPRESSOR) 50 MG tablet Take 1 tablet (50 mg total) by mouth once for 1 dose. TAKE TWO HOUR PRIOR TO  SCHEDULE CARDAIC TEST (Patient not taking: Reported on 07/19/2019) 1 tablet 0  . Multiple Vitamins-Minerals (ICAPS AREDS 2 PO) Take 1 capsule by mouth 2 (two) times daily.     Marland Kitchen olmesartan (BENICAR) 20 MG tablet Take 1 tablet (20 mg total) by mouth daily. 90 tablet 3  . potassium chloride SA (KLOR-CON) 20 MEQ tablet Take 20 mEq by mouth daily as needed (when taking furosemide).     No current facility-administered medications for this visit.    OBJECTIVE: African-American woman in no acute distress  Vitals:   02/13/20 1452  BP: (!) 187/83  Pulse: 81  Resp: 18  Temp: 98.9 F (37.2 C)  SpO2: 100%     Body mass index is 36.55 kg/m.   Wt Readings from Last 3 Encounters:  02/13/20 216 lb 4.8 oz (98.1 kg)  08/12/19 224 lb 8 oz (101.8 kg)  07/27/19 220 lb (99.8 kg)      ECOG FS:1 - Symptomatic but completely  ambulatory  Sclerae unicteric, EOMs intact Wearing a mask No cervical or supraclavicular adenopathy Lungs no rales or rhonchi Heart regular rate and rhythm Abd soft, obese, nontender, positive bowel sounds MSK no focal spinal tenderness, no upper extremity lymphedema Neuro: nonfocal, well oriented, appropriate affect Breasts: The  right breast is unremarkable.  The left breast is status post lumpectomy and radiation.  There is mild hyperpigmentation, some scar formation particularly in the upper portion of the breast and mild coarsening of the skin as expected but overall the cosmetic result is good and there is no evidence of disease recurrence.  Both axillae are benign.   LAB RESULTS:  CMP     Component Value Date/Time   NA 142 02/13/2020 1359   NA 140 05/10/2019 1457   K 3.9 02/13/2020 1359   CL 109 02/13/2020 1359   CO2 23 02/13/2020 1359   GLUCOSE 103 (H) 02/13/2020 1359   BUN 15 02/13/2020 1359   BUN 18 05/10/2019 1457   CREATININE 0.75 02/13/2020 1359   CREATININE 0.78 07/21/2019 1525   CALCIUM 9.3 02/13/2020 1359   PROT 6.6 02/13/2020 1359   ALBUMIN 3.9 02/13/2020 1359   AST 19 02/13/2020 1359   AST 15 07/21/2019 1525   ALT 30 02/13/2020 1359   ALT 19 07/21/2019 1525   ALKPHOS 82 02/13/2020 1359   BILITOT 0.2 (L) 02/13/2020 1359   BILITOT 0.2 (L) 07/21/2019 1525   GFRNONAA >60 02/13/2020 1359   GFRNONAA >60 07/21/2019 1525   GFRAA >60 02/13/2020 1359   GFRAA >60 07/21/2019 1525    No results found for: TOTALPROTELP, ALBUMINELP, A1GS, A2GS, BETS, BETA2SER, GAMS, MSPIKE, SPEI  No results found for: KPAFRELGTCHN, LAMBDASER, KAPLAMBRATIO  Lab Results  Component Value Date   WBC 8.8 02/13/2020   NEUTROABS 5.1 02/13/2020   HGB 12.3 02/13/2020   HCT 39.1 02/13/2020   MCV 96.8 02/13/2020   PLT 251 02/13/2020   No results found for: LABCA2  No components found for: QBHALP379  No results for input(s): INR in the last 168 hours.  No results found for:  LABCA2  No results found for: KWI097  No results found for: DZH299  No results found for: MEQ683  No results found for: CA2729  No components found for: HGQUANT  No results found for: CEA1 / No results found for: CEA1   No results found for: AFPTUMOR  No results found for: CHROMOGRNA  No results found for: HGBA, HGBA2QUANT, HGBFQUANT, HGBSQUAN (Hemoglobinopathy evaluation)   No results found for: LDH  No results found for: IRON, TIBC, IRONPCTSAT (Iron and TIBC)  No results found for: FERRITIN  Urinalysis    Component Value Date/Time   COLORURINE YELLOW 07/02/2018 [place] 07/02/2018 1752   LABSPEC 1.021 07/02/2018 1752   PHURINE 5.0 07/02/2018 1752   GLUCOSEU NEGATIVE 07/02/2018 1752   HGBUR NEGATIVE 07/02/2018 1752   BILIRUBINUR NEGATIVE 07/02/2018 1752   KETONESUR 20 (A) 07/02/2018 1752   PROTEINUR NEGATIVE 07/02/2018 1752   UROBILINOGEN 1.0 02/20/2012 0927   NITRITE NEGATIVE 07/02/2018 1752   LEUKOCYTESUR NEGATIVE 07/02/2018 1752    STUDIES: No results found.   ELIGIBLE FOR AVAILABLE RESEARCH PROTOCOL: no  ASSESSMENT: 85 y.o. [employer], [place] woman status post left breast upper outer quadrant biopsy 06/22/2019 for a clinical T1b N0, stage IB invasive ductal carcinoma, grade 3, triple negative, with an MIB-1-1 of 30%.  (1) status post left lumpectomy and sentinel lymph node sampling 07/27/2019 for a pT1b pN0, stage IB invasive ductal carcinoma, with clear margins.  (a) 2 left axillary lymph nodes were removed  (2) no adjuvant chemotherapy planned  (3) adjuvant radiation 09/07/2019 through 10/06/2019 Site Technique Total Dose (Gy) Dose per Fx (Gy) Completed Fx Beam Energies  Breast, Left: Breast_Lt 3D 40.05/40.05 2.67 15/15 6X,  10X  Breast, Left: Breast_Lt_Bst specialPort 10/10 2 5/5 15E    PLAN: Niana is done with her treatment, which she tolerated well.  As noted above we are not proceeding with chemotherapy but simply following.   She will have her next mammogram in August.  She will see our survivorship nurse practitioner in November and then she will see me again after her next mammogram August 2022.  I reassured her that the fatigue she is still feeling is likely not only from the virus but also from the radiation and should dissipate over the next 2 to 3 months.  I have encouraged her to call us with any problems that may develop before the next visit  Total encounter time 25 minutes.Chauncey Cruel, MD   02/13/2020 3:02 PM Medical Oncology and Hematology [facility] [place], [place] [address] Tel. [phone]    Fax. [phone]   This document serves as a record of services personally performed by Lurline Del, MD. It was created on his behalf by Wilburn Mylar, a trained medical scribe. The creation of this record is based on the scribe's personal observations and the provider's statements to them.   I, Lurline Del MD, have reviewed the above documentation for accuracy and completeness, and I agree with the above.   *Total Encounter Time as defined by the Centers for Medicare and Medicaid Services includes, in addition to the face-to-face time of a patient visit (documented in the note above) non-face-to-face time: obtaining and reviewing outside history, ordering and reviewing medications, tests or procedures, care coordination (communications with other health care professionals or caregivers) and documentation in the medical record.

## 2020-02-13 ENCOUNTER — Inpatient Hospital Stay: Payer: Medicare HMO

## 2020-02-13 ENCOUNTER — Inpatient Hospital Stay: Payer: Medicare HMO | Attending: Oncology | Admitting: Oncology

## 2020-02-13 ENCOUNTER — Other Ambulatory Visit: Payer: Self-pay

## 2020-02-13 VITALS — BP 187/83 | HR 81 | Temp 98.9°F | Resp 18 | Ht 64.5 in | Wt 216.3 lb

## 2020-02-13 DIAGNOSIS — I10 Essential (primary) hypertension: Secondary | ICD-10-CM | POA: Diagnosis not present

## 2020-02-13 DIAGNOSIS — C50412 Malignant neoplasm of upper-outer quadrant of left female breast: Secondary | ICD-10-CM

## 2020-02-13 DIAGNOSIS — Z171 Estrogen receptor negative status [ER-]: Secondary | ICD-10-CM

## 2020-02-13 DIAGNOSIS — E039 Hypothyroidism, unspecified: Secondary | ICD-10-CM | POA: Diagnosis not present

## 2020-02-13 DIAGNOSIS — R5383 Other fatigue: Secondary | ICD-10-CM | POA: Insufficient documentation

## 2020-02-13 DIAGNOSIS — Z853 Personal history of malignant neoplasm of breast: Secondary | ICD-10-CM | POA: Insufficient documentation

## 2020-02-13 LAB — COMPREHENSIVE METABOLIC PANEL
ALT: 30 U/L (ref 0–44)
AST: 19 U/L (ref 15–41)
Albumin: 3.9 g/dL (ref 3.5–5.0)
Alkaline Phosphatase: 82 U/L (ref 38–126)
Anion gap: 10 (ref 5–15)
BUN: 15 mg/dL (ref 8–23)
CO2: 23 mmol/L (ref 22–32)
Calcium: 9.3 mg/dL (ref 8.9–10.3)
Chloride: 109 mmol/L (ref 98–111)
Creatinine, Ser: 0.75 mg/dL (ref 0.44–1.00)
GFR calc Af Amer: >60 mL/min (ref >60)
GFR calc non Af Amer: >60 mL/min (ref >60)
Glucose, Bld: 103 mg/dL — ABNORMAL HIGH (ref 70–99)
Potassium: 3.9 mmol/L (ref 3.5–5.1)
Sodium: 142 mmol/L (ref 135–145)
Total Bilirubin: 0.2 mg/dL — ABNORMAL LOW (ref 0.3–1.2)
Total Protein: 6.6 g/dL (ref 6.5–8.1)

## 2020-02-13 LAB — CBC WITH DIFFERENTIAL/PLATELET
Abs Immature Granulocytes: 0.03 10*3/uL (ref 0.00–0.07)
Basophils Absolute: 0.1 10*3/uL (ref 0.0–0.1)
Basophils Relative: 1 %
Eosinophils Absolute: 0.4 10*3/uL (ref 0.0–0.5)
Eosinophils Relative: 4 %
HCT: 39.1 % (ref 36.0–46.0)
Hemoglobin: 12.3 g/dL (ref 12.0–15.0)
Immature Granulocytes: 0 %
Lymphocytes Relative: 29 %
Lymphs Abs: 2.5 10*3/uL (ref 0.7–4.0)
MCH: 30.4 pg (ref 26.0–34.0)
MCHC: 31.5 g/dL (ref 30.0–36.0)
MCV: 96.8 fL (ref 80.0–100.0)
Monocytes Absolute: 0.8 10*3/uL (ref 0.1–1.0)
Monocytes Relative: 9 %
Neutro Abs: 5.1 10*3/uL (ref 1.7–7.7)
Neutrophils Relative %: 57 %
Platelets: 251 10*3/uL (ref 150–400)
RBC: 4.04 MIL/uL (ref 3.87–5.11)
RDW: 13.8 % (ref 11.5–15.5)
WBC: 8.8 10*3/uL (ref 4.0–10.5)
nRBC: 0 % (ref 0.0–0.2)

## 2020-02-14 ENCOUNTER — Telehealth: Payer: Self-pay | Admitting: Oncology

## 2020-02-14 NOTE — Telephone Encounter (Signed)
Scheduled appts per 4/26 los. Left voicemail with next appt date and time. Mailed appt reminder and calendar.

## 2020-02-27 ENCOUNTER — Other Ambulatory Visit: Payer: Self-pay | Admitting: Cardiology

## 2020-03-12 ENCOUNTER — Encounter: Payer: Self-pay | Admitting: Cardiology

## 2020-03-12 ENCOUNTER — Other Ambulatory Visit: Payer: Self-pay

## 2020-03-12 ENCOUNTER — Ambulatory Visit: Payer: Medicare HMO | Admitting: Cardiology

## 2020-03-12 VITALS — BP 156/84 | HR 50 | Ht 64.5 in | Wt 216.8 lb

## 2020-03-12 DIAGNOSIS — I10 Essential (primary) hypertension: Secondary | ICD-10-CM

## 2020-03-12 DIAGNOSIS — R5383 Other fatigue: Secondary | ICD-10-CM

## 2020-03-12 DIAGNOSIS — R001 Bradycardia, unspecified: Secondary | ICD-10-CM | POA: Diagnosis not present

## 2020-03-12 DIAGNOSIS — R0600 Dyspnea, unspecified: Secondary | ICD-10-CM | POA: Diagnosis not present

## 2020-03-12 DIAGNOSIS — R0609 Other forms of dyspnea: Secondary | ICD-10-CM

## 2020-03-12 MED ORDER — ATENOLOL 25 MG PO TABS: 12.5000 mg | ORAL_TABLET | Freq: Every day | ORAL | 1 refills | Status: DC

## 2020-03-12 MED ORDER — VALSARTAN-HYDROCHLOROTHIAZIDE 80-12.5 MG PO TABS: 1.0000 | ORAL_TABLET | Freq: Every day | ORAL | 3 refills | Status: DC

## 2020-03-12 NOTE — Patient Instructions (Addendum)
Medication Instructions:   Take  1/2 tablet of 25 mg Atenolol at bedtime Start this tomorrow night.   START  A NEW MEDICATION WILL CALL TO THE PHARMACY [facility]- HCTZ 80/12.5 MG  ONE TABLET DAILY.  *If you need a refill on your cardiac medications before your next appointment, please call your pharmacy*   Lab Work: NOT NEEDED   Testing/Procedures: WILL BE SCHEDULE AT [place] [address] Your physician has requested that you have an echocardiogram. Echocardiography is a painless test that uses sound waves to create images of your heart. It provides your doctor with information about the size and shape of your heart and how well your heart's chambers and valves are working. This procedure takes approximately one hour. There are no restrictions for this procedure.     Follow-Up: At [facility], you and your health needs are our priority.  As part of our continuing mission to provide you with exceptional heart care, we have created designated Provider Care Teams.  These Care Teams include your primary Cardiologist (physician) and Advanced Practice Providers (APPs -  Physician Assistants and Nurse Practitioners) who all work together to provide you with the care you need, when you need it.  We recommend signing up for the patient portal called "MyChart".  Sign up information is provided on this After Visit Summary.  MyChart is used to connect with patients for Virtual Visits (Telemedicine).  Patients are able to view lab/test results, encounter notes, upcoming appointments, etc.  Non-urgent messages can be sent to your provider as well.   To learn more about what you can do with MyChart, go to [email or web address].    Your next appointment:   3 month(s)  The format for your next appointment:   In Person  Provider:   Glenetta Hew, MD

## 2020-03-12 NOTE — Progress Notes (Signed)
Primary Care Provider: [name], MD Cardiologist: No primary care provider on file. Electrophysiologist: None  Clinic Note: Chief Complaint  Patient presents with  . Follow-up    Notes exertional dyspnea    HPI:    Alisha [name] is a 85 y.o. female with a PMH below who presents today for delayed 49-month follow-up.  Alisha [name] was referred by her PCP to establish cardiology care.  She has been a patient of Dr. Wynonia Lawman until his retirement.  I saw her for evaluation of fatigue and lack of stamina.  She had gained about 40 pounds in the past 3 years, indicating that she simply stop doing things because of dyspnea and lack of energy.  For preop we ordered a coronary CT angiogram and event monitor to evaluate bradycardia)  Alisha [name] was last seen in September 2020 preop evaluation (for left breast lumpectomy and radioactive seed implant) following up CT angiogram.  She had previously been on accommodation of atenolol and diltiazem.  Both were stopped.  However beta-blocker was continued preop secondary to short bursts of PAT/PSVT.  Recent Hospitalizations:   July 27, 2019: Left breast lumpectomy (of seed clips) with left axillary sentinel lymph node mapping and to node resection  Biopsy confirmed invasive ductal carcinoma.  2 lymph nodes were negative for metastatic carcinoma.  She had follow-up adjuvant radiation.   Tested positive for COVID-19 on 11/21/2019; was negative on 12/05/2019  Reviewed  CV studies:    The following studies were reviewed today: (if available, images/films reviewed: From Epic Chart or Care Everywhere) . No new studies:   Interval History:   Alisha [name] is here today for follow-up stating that she is still having exertional dyspnea.  She just has fatigue and is really not felt right since her COVID-19 infection.  She really describes it more of just feeling tired with no energy.  Exercise intolerance.  She is not  really describing any chest pain or pressure with rest or exertion.  I asked if this level of fatigue is more than it was when I saw her in September, and she indicates that it probably is.  She just has not felt like doing much of anything since her Covid.  She does not notice any claudication but does note burning in her feet right greater than left.  She feels like her toes are on fire.  She has not noted any heart failure symptoms or arrhythmia symptoms.  CV Review of Symptoms (Summary) Cardiovascular ROS: positive for - dyspnea on exertion and Exercise intolerance, mild pedal edema with neuropathy. negative for - chest pain, edema, irregular heartbeat, orthopnea, palpitations, paroxysmal nocturnal dyspnea, rapid heart rate, shortness of breath or Syncope/near syncope, TIA/amaurosis fugax, claudication  The patient does not have symptoms concerning for COVID-19 infection (fever, chills, cough, or new shortness of breath).  The patient is practicing social distancing & Masking.   She has had her COVID-19 vaccines despite having subclinical case of COVID-19 positive in February 2021  REVIEWED OF SYSTEMS   Review of Systems  Constitutional: Positive for malaise/fatigue and weight loss (Has been stable for the last month, but was losing weight from October until April).  HENT: Negative for congestion and nosebleeds.   Respiratory: Negative for cough and shortness of breath (Noted in HPI).   Gastrointestinal: Negative for abdominal pain, blood in stool and melena.  Genitourinary: Negative for hematuria.  Musculoskeletal: Positive for joint pain. Negative for falls.  Neurological: Positive for focal weakness (  Legs feel weak sometimes). Negative for dizziness and headaches.  Psychiatric/Behavioral: Negative for depression and memory loss. The patient is not nervous/anxious and does not have insomnia.    I have reviewed and (if needed) personally updated the patient's problem list,  medications, allergies, past medical and surgical history, social and family history.   PAST MEDICAL HISTORY   Past Medical History:  Diagnosis Date  . Arthritis   . Asthma    Never smoker  . Blood transfusion    over 40 y ears  . Breast mass, left   . Cancer ([place])   . History of radiation therapy 09/07/19- 10/06/19   Left Breast 15 fractions of 2.67 Gy to total 40.05 Gy. Left breast boost 5 fractions of 2 Gy each to total 10 Gy  . Hypertension    on meds  . Hypothyroidism    on meds  . Pre-diabetes     PAST SURGICAL HISTORY   Past Surgical History:  Procedure Laterality Date  . ABDOMINAL HYSTERECTOMY     32 yrs  . BREAST LUMPECTOMY WITH RADIOACTIVE SEED AND SENTINEL LYMPH NODE BIOPSY Left 07/27/2019   Procedure: LEFT BREAST LUMPECTOMY WITH RADIOACTIVE SEED AND LEFT SENTINEL LYMPH NODE MAPPING;  Surgeon: Erroll Luna, MD;  Location: [facility];  Service: General;  Laterality: Left;  . BREAST LUMPECTOMY WITH RADIOACTIVE SEED LOCALIZATION Left 08/11/2016   Procedure: LEFT BREAST LUMPECTOMY WITH RADIOACTIVE SEED LOCALIZATION;  Surgeon: Erroll Luna, MD;  Location: [facility];  Service: General;  Laterality: Left;  . CATARACT EXTRACTION, BILATERAL    . EYE SURGERY    . JOINT REPLACEMENT Bilateral    left knee 2012  . KNEE ARTHROPLASTY  02/17/2012   Procedure: COMPUTER ASSISTED TOTAL KNEE ARTHROPLASTY;  Surgeon: Meredith Pel, MD;  Location: [facility];  Service: Orthopedics;  Laterality: Right;  Right total knee replacement     14-day ZIO patch monitor July 2020: Several short bursts of PAT as well as occasional sinus bradycardia (mostly during sleeping hours).  One was midday.  Second half of the duration she was off of diltiazem and noted better heart rate at baseline, less bradycardia and more heart responsiveness..  Fastest sinus rhythm rate was 88 bpm.  CORONARY CALCIUM SCORE-CT ANGIOGRAM 05/12/2019: Small, < 27mm nodule noted.  Difficult study to read.   Coronary calcium score was 11.  Significant motion artifact, but visible territory showed minimal disease.  Minimal calcium noted in the ostial LAD but no suggestion of significant CAD.  MEDICATIONS/ALLERGIES   Current Meds  Medication Sig  . acetaminophen (TYLENOL) 500 MG tablet Take 1,000 mg by mouth every 6 (six) hours as needed for moderate pain.   Marland Kitchen albuterol (PROVENTIL HFA;VENTOLIN HFA) 108 (90 Base) MCG/ACT inhaler Inhale 1-2 puffs into the lungs every 6 (six) hours as needed for wheezing or shortness of breath.   . Artificial Tear Solution (SOOTHE XP OP) Place 1 drop into both eyes 3 (three) times daily as needed (dry or irritated eyes).  Marland Kitchen atenolol (TENORMIN) 25 MG tablet Take 0.5 tablets (12.5 mg total) by mouth at bedtime.  . Biotin 5 MG TABS Take 5 mg by mouth daily.   . cetaphil (CETAPHIL) cream Apply 1 application topically 2 (two) times daily.   . diphenhydrAMINE HCl (ZZZQUIL) 50 MG/30ML LIQD Take 50 mg by mouth at bedtime as needed (sleep).   . fluticasone (FLONASE) 50 MCG/ACT nasal spray Place 2 sprays into both nostrils daily as needed for allergies or rhinitis.  . furosemide (  LASIX) 20 MG tablet Take 20 mg by mouth daily as needed for fluid or edema.  Marland Kitchen levothyroxine (SYNTHROID, LEVOTHROID) 88 MCG tablet Take 88 mcg by mouth daily before breakfast.   . meclizine (ANTIVERT) 25 MG tablet Take 12.5 mg by mouth 3 (three) times daily as needed for dizziness.  . Multiple Vitamins-Minerals (ICAPS AREDS 2 PO) Take 1 capsule by mouth 2 (two) times daily.   . potassium chloride SA (KLOR-CON) 20 MEQ tablet Take 20 mEq by mouth daily as needed (when taking furosemide).  . [DISCONTINUED] atenolol (TENORMIN) 25 MG tablet TAKE 1 TABLET(25 MG) BY MOUTH DAILY    Allergies  Allergen Reactions  . Lisinopril Itching    No energy  . Penicillins Itching and Rash    Has patient had a PCN reaction causing immediate rash, facial/tongue/throat swelling, SOB or lightheadedness with hypotension:  No Has patient had a PCN reaction causing severe rash involving mucus membranes or skin necrosis: No Has patient had a PCN reaction that required hospitalization: No Has patient had a PCN reaction occurring within the last 10 years: No If all of the above answers are "NO", then may proceed with Cephalosporin use.  . Sulfa Antibiotics Itching and Rash    SOCIAL HISTORY/FAMILY HISTORY   Reviewed in Epic:  Pertinent findings: No new changes.  OBJCTIVE -PE, EKG, labs   Wt Readings from Last 3 Encounters:  03/12/20 216 lb 12.8 oz (98.3 kg)  02/13/20 216 lb 4.8 oz (98.1 kg)  08/12/19 224 lb 8 oz (101.8 kg)    Physical Exam: BP (!) 156/84   Pulse (!) 50   Ht 5' 4.5" (1.638 m)   Wt 216 lb 12.8 oz (98.3 kg)   BMI 36.64 kg/m  Physical Exam  Constitutional: She is oriented to person, place, and time. She appears well-developed and well-nourished. No distress.  Well-groomed.  Moderately obese.  Relatively healthy-appearing  HENT:  Head: Normocephalic and atraumatic.  Eyes: EOM are normal.  Neck: No hepatojugular reflux and no JVD present. Carotid bruit is not present.  Cardiovascular: Regular rhythm, S1 normal, S2 normal and intact distal pulses.  No extrasystoles are present. Bradycardia present. PMI is not displaced. Exam reveals distant heart sounds. Exam reveals no gallop.  Murmur (Cannot exclude soft 1/6 SEM at RUSB) heard. Pulmonary/Chest: Effort normal. No respiratory distress. She has no wheezes. She has no rales.  Abdominal: Soft. Bowel sounds are normal. She exhibits no distension. There is no abdominal tenderness. There is no rebound.  No HSM-difficult assess due to truncal obesity.  Musculoskeletal:        General: Edema (Trivial-1+ bilateral LE) present. Normal range of motion.     Cervical back: Normal range of motion and neck supple.  Neurological: She is alert and oriented to person, place, and time. No cranial nerve deficit.  Psychiatric: She has a normal mood and  affect. Her behavior is normal. Judgment and thought content normal.  Vitals reviewed.   Adult ECG Report  Rate: 50 ;  Rhythm: sinus bradycardia, sinus arrhythmia and 1 degree AV block.  Otherwise normal axis intervals and durations.;   Narrative Interpretation: Borderline EKG.  Recent Labs: I do not have any labs since 2018.  She says that her PCP is checking routinely. No results found for: CHOL, HDL, LDLCALC, LDLDIRECT, TRIG, CHOLHDL Lab Results  Component Value Date   CREATININE 0.75 02/13/2020   BUN 15 02/13/2020   NA 142 02/13/2020   K 3.9 02/13/2020   CL 109 02/13/2020  CO2 23 02/13/2020   No results found for: TSH  ASSESSMENT/PLAN    Problem List Items Addressed This Visit    Bradycardia with 41-50 beats per minute (Chronic)    Heart rate still continue to drain.  I wonder if some of her fatigue is related to bradycardia.  Thankfully, she is no longer on diltiazem and we reduced her dose of atenolol.  Plan for now is to reduce further to 12.5 mg (1/2 tablet)-she will change her dose interval to p.m.      Relevant Orders   EKG 12-Lead (Completed)   ECHOCARDIOGRAM COMPLETE   Essential hypertension (Chronic)    Blood pressure is high, and I am going to reduce her atenolol dose because of bradycardia.  As such, we will need to add new medication. Plan: Add valsartan-HCTZ 80-12.5 mg daily.  She is due to see her PCP within the next month and will hopefully go get lab work done as well as blood pressure follow-up.      Relevant Medications   atenolol (TENORMIN) 25 MG tablet   valsartan-hydrochlorothiazide (DIOVAN-HCT) 80-12.5 MG tablet   Fatigue    Hard to tell what this is from.  Will bring her back off the beta-blocker to see if she has some improvement with current drug responsiveness.  We may simply need to totally stop beta-blocker altogether.  Otherwise look for noncardiac etiology per PCP.      DOE (dyspnea on exertion) - Primary    She does have other  reasons for dyspnea, but now with her worsening fatigue and exertional dyspnea, I would like to see if there is any evidence of structural normality.  Based on the fact that her coronary calcium score was pretty low, it is unlikely that she has obstructive CAD.  Plan: Check 2D echocardiogram.  Cut atenolol dose in half to allow for better heart rate response (cannot exclude chronotropic competence)       Relevant Orders   EKG 12-Lead (Completed)   ECHOCARDIOGRAM COMPLETE      COVID-19 Education: The signs and symptoms of COVID-19 were discussed with the patient and how to seek care for testing (follow up with PCP or arrange E-visit).   The importance of social distancing and COVID-19 vaccination was discussed today.  I spent a total of 23minutes with the patient. >  50% of the time was spent in direct patient consultation.  Additional time spent with chart review  / charting (studies, outside notes, etc): 8 Total Time: 29 min   Current medicines are reviewed at length with the patient today.  (+/- concerns) none  Notice: This dictation was prepared with Dragon dictation along with smaller phrase technology. Any transcriptional errors that result from this process are unintentional and may not be corrected upon review.  Patient Instructions / Medication Changes & Studies & Tests Ordered   Patient Instructions  Medication Instructions:   Take  1/2 tablet of 25 mg Atenolol at bedtime Start this tomorrow night.   START  A NEW MEDICATION WILL CALL TO THE PHARMACY VALSARTAN- HCTZ 80/12.5 MG  ONE TABLET DAILY.  *If you need a refill on your cardiac medications before your next appointment, please call your pharmacy*   Lab Work: NOT NEEDED   Testing/Procedures: WILL BE SCHEDULE AT [place] [address] Your physician has requested that you have an echocardiogram. Echocardiography is a painless test that uses sound waves to create images of your heart. It provides  your doctor with information about  the size and shape of your heart and how well your heart's chambers and valves are working. This procedure takes approximately one hour. There are no restrictions for this procedure.     Follow-Up: At [facility], you and your health needs are our priority.  As part of our continuing mission to provide you with exceptional heart care, we have created designated Provider Care Teams.  These Care Teams include your primary Cardiologist (physician) and Advanced Practice Providers (APPs -  Physician Assistants and Nurse Practitioners) who all work together to provide you with the care you need, when you need it.  We recommend signing up for the patient portal called "MyChart".  Sign up information is provided on this After Visit Summary.  MyChart is used to connect with patients for Virtual Visits (Telemedicine).  Patients are able to view lab/test results, encounter notes, upcoming appointments, etc.  Non-urgent messages can be sent to your provider as well.   To learn more about what you can do with MyChart, go to [email or web address].    Your next appointment:   3 month(s)  The format for your next appointment:   In Person  Provider:   Glenetta Hew, MD     Studies Ordered:   Orders Placed This Encounter  Procedures  . EKG 12-Lead  . ECHOCARDIOGRAM COMPLETE     Glenetta Hew, M.D., M.S. Interventional Cardiologist   Pager # [phone] Phone # [phone] [address] [place], [place] [address]   Thank you for choosing Heartcare at [facility]!!

## 2020-03-15 ENCOUNTER — Encounter: Payer: Self-pay | Admitting: Cardiology

## 2020-03-15 NOTE — Assessment & Plan Note (Signed)
She does have other reasons for dyspnea, but now with her worsening fatigue and exertional dyspnea, I would like to see if there is any evidence of structural normality.  Based on the fact that her coronary calcium score was pretty low, it is unlikely that she has obstructive CAD.  Plan: Check 2D echocardiogram.  Cut atenolol dose in half to allow for better heart rate response (cannot exclude chronotropic competence)

## 2020-03-15 NOTE — Assessment & Plan Note (Signed)
Heart rate still continue to drain.  I wonder if some of her fatigue is related to bradycardia.  Thankfully, she is no longer on diltiazem and we reduced her dose of atenolol.  Plan for now is to reduce further to 12.5 mg (1/2 tablet)-she will change her dose interval to p.m.

## 2020-03-15 NOTE — Assessment & Plan Note (Signed)
Blood pressure is high, and I am going to reduce her atenolol dose because of bradycardia.  As such, we will need to add new medication. Plan: Add valsartan-HCTZ 80-12.5 mg daily.  She is due to see her PCP within the next month and will hopefully go get lab work done as well as blood pressure follow-up.

## 2020-03-15 NOTE — Assessment & Plan Note (Signed)
Hard to tell what this is from.  Will bring her back off the beta-blocker to see if she has some improvement with current drug responsiveness.  We may simply need to totally stop beta-blocker altogether.  Otherwise look for noncardiac etiology per PCP.

## 2020-04-02 ENCOUNTER — Ambulatory Visit (HOSPITAL_COMMUNITY): Payer: Medicare HMO | Attending: Cardiology

## 2020-04-02 ENCOUNTER — Other Ambulatory Visit: Payer: Self-pay

## 2020-04-02 DIAGNOSIS — R001 Bradycardia, unspecified: Secondary | ICD-10-CM

## 2020-04-02 DIAGNOSIS — R0600 Dyspnea, unspecified: Secondary | ICD-10-CM | POA: Diagnosis not present

## 2020-04-02 DIAGNOSIS — R0609 Other forms of dyspnea: Secondary | ICD-10-CM

## 2020-04-10 DIAGNOSIS — E039 Hypothyroidism, unspecified: Secondary | ICD-10-CM | POA: Diagnosis not present

## 2020-04-10 DIAGNOSIS — R739 Hyperglycemia, unspecified: Secondary | ICD-10-CM | POA: Diagnosis not present

## 2020-04-10 DIAGNOSIS — E559 Vitamin D deficiency, unspecified: Secondary | ICD-10-CM | POA: Diagnosis not present

## 2020-04-10 DIAGNOSIS — L299 Pruritus, unspecified: Secondary | ICD-10-CM | POA: Diagnosis not present

## 2020-04-10 DIAGNOSIS — M79609 Pain in unspecified limb: Secondary | ICD-10-CM | POA: Diagnosis not present

## 2020-04-10 DIAGNOSIS — Z0000 Encounter for general adult medical examination without abnormal findings: Secondary | ICD-10-CM | POA: Diagnosis not present

## 2020-04-10 DIAGNOSIS — Z79899 Other long term (current) drug therapy: Secondary | ICD-10-CM | POA: Diagnosis not present

## 2020-04-10 DIAGNOSIS — I10 Essential (primary) hypertension: Secondary | ICD-10-CM | POA: Diagnosis not present

## 2020-04-10 DIAGNOSIS — M79671 Pain in right foot: Secondary | ICD-10-CM | POA: Diagnosis not present

## 2020-04-10 DIAGNOSIS — I700 Atherosclerosis of aorta: Secondary | ICD-10-CM | POA: Diagnosis not present

## 2020-04-10 DIAGNOSIS — Z853 Personal history of malignant neoplasm of breast: Secondary | ICD-10-CM | POA: Diagnosis not present

## 2020-04-30 ENCOUNTER — Telehealth: Payer: Self-pay | Admitting: Cardiology

## 2020-04-30 DIAGNOSIS — H40053 Ocular hypertension, bilateral: Secondary | ICD-10-CM | POA: Diagnosis not present

## 2020-04-30 DIAGNOSIS — H402231 Chronic angle-closure glaucoma, bilateral, mild stage: Secondary | ICD-10-CM | POA: Diagnosis not present

## 2020-04-30 DIAGNOSIS — I10 Essential (primary) hypertension: Secondary | ICD-10-CM

## 2020-04-30 DIAGNOSIS — H04123 Dry eye syndrome of bilateral lacrimal glands: Secondary | ICD-10-CM | POA: Diagnosis not present

## 2020-04-30 NOTE — Telephone Encounter (Signed)
Pt c/o medication issue:  1. Name of Medication: valsartan-hydrochlorothiazide (DIOVAN-HCT) 80-12.5 MG tablet  2. How are you currently taking this medication (dosage and times per day)? As directed  3. Are you having a reaction (difficulty breathing--STAT)? No  4. What is your medication issue? Patient states medication is causing fatigue and extreme itchiness. Please call to discuss.

## 2020-04-30 NOTE — Telephone Encounter (Signed)
Left message for pt to call.

## 2020-05-01 NOTE — Telephone Encounter (Signed)
Lm to call back ./cy 

## 2020-05-02 NOTE — Telephone Encounter (Signed)
LMTCB regarding patients recent call to our office stating she was having a medication reaction.

## 2020-05-02 NOTE — Telephone Encounter (Signed)
Left message to call back  

## 2020-05-02 NOTE — Telephone Encounter (Signed)
Patient is returning call.  °

## 2020-05-03 ENCOUNTER — Telehealth: Payer: Self-pay | Admitting: Cardiology

## 2020-05-03 ENCOUNTER — Telehealth: Payer: Self-pay | Admitting: *Deleted

## 2020-05-03 NOTE — Telephone Encounter (Signed)
Spoke with pt, she reports after start this new medication and has developed a light rash that she can feel when she scratches. She also reports extreme fatigue during the day. She is having to lay down frequently during the day. Patient was given the okay to stop the valsartan-hctz. Will forward this to our pharm md to help with changing to a different medication. She is not able to check her bp at home. She will try claritin to help with the itching. Pt agreed with this plan.

## 2020-05-03 NOTE — Telephone Encounter (Signed)
° ° ° ° °  I went in pt's chart to see who had called her today. It was Trixie Dredge, I transferred the call to her.

## 2020-05-03 NOTE — Telephone Encounter (Signed)
The patient has been notified of the result and verbalized understanding.  All questions (if any) were answered.   Patient states she has been itching and feeling very tired since starting Valsartan/Hctz . She wanted to know if this similar the medication she has taken in the past.   RN informed patient  Will defer to Dr Ellyn Hack and contact her back  Raiford Simmonds, RN 05/03/2020 2:25 PM

## 2020-05-03 NOTE — Telephone Encounter (Signed)
Left message to call back to give results-echo

## 2020-05-03 NOTE — Telephone Encounter (Signed)
-----   Message from Leonie Man, MD sent at 04/09/2020  7:15 AM EDT ----- Echocardiogram result suggests normal pump function of 60 to 65%.  There is abnormal relaxation/grade 2 diastolic dysfunction which is little more than expected for her age.  This could explain some exertional shortness of breath, but is not enough to cause a significant change.  Otherwise mild aortic stenosis not significant enough to cause symptoms.   Overall, there could be some shortness of breath explained by the abnormal relaxation--recommendation is blood pressure control.  Glenetta Hew, MD

## 2020-05-04 MED ORDER — AMLODIPINE BESYLATE 2.5 MG PO TABS: 2.5000 mg | ORAL_TABLET | Freq: Every day | ORAL | 1 refills | Status: DC

## 2020-05-04 NOTE — Addendum Note (Signed)
Addended by: Rollen Sox on: 05/04/2020 03:27 PM   Modules accepted: Orders

## 2020-05-04 NOTE — Telephone Encounter (Signed)
She has been on lisinopril HCTZ in the past -> there is not that much cross-reaction between those medicines besides the HCTZ.  Lets try doing simply valsartan without the HCTZ. - Would just try 1 month supply of valsartan 80 mg tablets-take 1/2 tablet daily.  Otherwise we may need to switch to a different option such as amlodipine.  Glenetta Hew

## 2020-05-04 NOTE — Telephone Encounter (Signed)
Spoke with patient regarding itching/rash with valsartan/hctz.  Despite itching she has remained on it because she felt it was helping her BP.  Also has been on atenolol 50 mg  Patient has an allergy to sulfa antibiotics but reports she has previously been on HCTZ in the past without incident.  Advised patient to d/c.  Has been on numerous BP medications but has not been checking her BP.  Has difficulty checking on her right arm due to lack of strength in her left hand and is concerned about checking it on her left arm due to history of breast cancer surgery.  There is comment in a surgery note from 2020 of an allergy to Norvasc, however patient does not remember ever taking amlodipine.  Patient is willing to try.  Advised patient to call immediately if she has any type of reaction. Patient voiced understanding and will try to have someone help her take her BP.

## 2020-05-10 ENCOUNTER — Telehealth: Payer: Self-pay | Admitting: Adult Health

## 2020-05-10 NOTE — Telephone Encounter (Signed)
Rescheduled appointments per 7/22 message. Patient is aware of updated appointment date and time.

## 2020-05-21 ENCOUNTER — Telehealth: Payer: Self-pay

## 2020-05-21 NOTE — Telephone Encounter (Signed)
Returned call to pt who has questions before scheduling mammogram. Pt did not answer. LVM to return call to [phone] Dr Magrinat's nurse.

## 2020-05-30 ENCOUNTER — Telehealth: Payer: Self-pay | Admitting: Cardiology

## 2020-05-30 DIAGNOSIS — Z79899 Other long term (current) drug therapy: Secondary | ICD-10-CM

## 2020-05-30 MED ORDER — SPIRONOLACTONE 25 MG PO TABS: ORAL_TABLET | ORAL | 3 refills | Status: DC

## 2020-05-30 NOTE — Telephone Encounter (Signed)
Spoke with patient of [name] who has concerns about SE to amlodipine - see 04/30/20 note - c/o fatigue, dizziness, skin issues (as noted below)  154/79 and HR 53 - on no meds (stopped amlodipine yesterday)   She said she has difficulty tolerating medications  Advised patient that message has been sent to PharmD and MD to review

## 2020-05-30 NOTE — Telephone Encounter (Signed)
Pt c/o medication issue:  1. Name of Medication: amLODipine (NORVASC) 2.5 MG tablet  2. How are you currently taking this medication (dosage and times per day)? Pt stopped taking it yesterday  3. Are you having a reaction (difficulty breathing--STAT)? Yes. Dizziness, fatigue  4. What is your medication issue?  fatigue, no energy, itching, dry skin, peeling skin around fingernails. Pot is not sure if skin issues are due to the medication or not though. She knows the fatigue is   STAT if patient feels like he/she is going to faint   1) Are you dizzy now? no  2) Do you feel faint or have you passed out? no  3) Do you have any other symptoms? Fatigue  4) Have you checked your HR and BP (record if available)? 154/79 HR 53

## 2020-05-30 NOTE — Telephone Encounter (Signed)
Looks like she is sensitive to most things.  Let's try spironolactone 12.5 mg daily in the mornings.  That will be only 1/2 tablet of the 25 mg.  She sees DH on the 24th of this month, will need a BMET about that time.

## 2020-05-30 NOTE — Telephone Encounter (Signed)
Sounds like a good plan.  Glenetta Hew, MD

## 2020-05-30 NOTE — Telephone Encounter (Signed)
Patient aware of recommendations - agreed w/plan.  Rx(s) sent to pharmacy electronically. Lab ordered

## 2020-06-12 ENCOUNTER — Ambulatory Visit: Payer: Medicare HMO | Admitting: Cardiology

## 2020-06-12 ENCOUNTER — Other Ambulatory Visit: Payer: Self-pay

## 2020-06-12 ENCOUNTER — Encounter: Payer: Self-pay | Admitting: Cardiology

## 2020-06-12 VITALS — BP 166/68 | HR 58 | Ht 64.5 in | Wt 223.8 lb

## 2020-06-12 DIAGNOSIS — I5032 Chronic diastolic (congestive) heart failure: Secondary | ICD-10-CM | POA: Diagnosis not present

## 2020-06-12 DIAGNOSIS — R001 Bradycardia, unspecified: Secondary | ICD-10-CM

## 2020-06-12 DIAGNOSIS — I10 Essential (primary) hypertension: Secondary | ICD-10-CM

## 2020-06-12 DIAGNOSIS — Z79899 Other long term (current) drug therapy: Secondary | ICD-10-CM

## 2020-06-12 MED ORDER — LOSARTAN POTASSIUM 25 MG PO TABS: 25.0000 mg | ORAL_TABLET | Freq: Every day | ORAL | 3 refills | Status: DC

## 2020-06-12 NOTE — Patient Instructions (Addendum)
Medication Instructions:   CONTINUE TO TAKE SPIRONOLACTONE 25 MG  DAILY  ONLY USE FUROSEMIDE ON AS NEEDED BASIS   FOLLOW THIS INSTRUCTIONS FOR LOSARTAN 25 MG  FOR ONE MONTH ( 4 WEEKS )  TAKE  12.5 MG ( 1/2TABLET 25 MG)  , THEN   2 WEEKS  ALTERNATE TAKE( 25 MG ) ONE TABLET LOSARTAN  ,ONE DAY THEN NEXT DAY TAKE (12.5 MG) HALF OF TABLET  LOSARTAN  THEN START TAKING LOSARTAN 2 5MG   ONE TABLET DAILY   NO OTHER CHANGES  *If you need a refill on your cardiac medications before your next appointment, please call your pharmacy*   Lab Work: NOT NEEDED   Testing/Procedures: NOT NEEDED   Follow-Up: At [facility], you and your health needs are our priority.  As part of our continuing mission to provide you with exceptional heart care, we have created designated Provider Care Teams.  These Care Teams include your primary Cardiologist (physician) and Advanced Practice Providers (APPs -  Physician Assistants and Nurse Practitioners) who all work together to provide you with the care you need, when you need it.     Your next appointment:   5 month(s) JAN 2022  The format for your next appointment:   In Person  Provider:   Glenetta Hew, MD

## 2020-06-12 NOTE — Progress Notes (Signed)
Primary Care Provider: [name], MD Cardiologist: No primary care provider on file. Electrophysiologist: None  Clinic Note: Chief Complaint  Patient presents with  . Follow-up    3 months; echo results  . Fatigue    DOE  . Hypertension   HPI:    Alisha [name] is a 85 y.o. female with a PMH below who presents today for delayed 45month follow-up discuss echocardiogram results/fatigue.  Alisha [name] was a patient of of Dr. Wynonia Lawman until his retirement.  I saw her for evaluation of fatigue and lack of stamina.  She had gained about 40 pounds in the past 3 years, indicating that she simply stop doing things because of dyspnea and lack of energy.  For preop we ordered a coronary CT angiogram and event monitor to evaluate bradycardia back in July 2020:  Coronary Calcium Score/CT Angiogram (May 12, 2019): coronary calcium score was 11.  Minimal ostial LAD calcific plaque noted but otherwise no significant CAD.  14-day Zio patch: Several short bursts of PAT and occasional sinus bradycardia during sleeping hours.  (1 bradycardia during daytime hours).  During the second half of the monitor, she stopped diltiazem-less bradycardia noted.  [name] was last seen on May 24 for routine follow-up.  She is noting that she was still having exertional dyspnea and fatigue.  Just not feeling right since her COVID-19 infection.  She noted exercise intolerance but not really any chest pain or pressure with rest or exertion. -> Ordered echocardiogram  Atenolol dose was reduced because of fatigue and bradycardia and we added valsartan-HCTZ.  Unfortunately, she did not tolerate that.  We then switch her to amlodipine 2.5 mg  She was then finally started on spironolactone 25 mg daily.  Recent Hospitalizations: None   Tested positive for COVID-19 on 11/21/2019; was negative on 12/05/2019  Reviewed  CV studies:    The following studies were reviewed today: (if available,  images/films reviewed: From Epic Chart or Care Everywhere) . TTE April 02, 2020: Normal LV function.  No R WMA.  Moderate LVH.  GRII DD with elevated filling pressures.  Moderate LA dilation with trivial MR.  Mild aortic valve sclerosis.  Interval History:   Alisha [name] is here today for follow-up stating that her fatigue and dizziness of dyspnea has is notably improved.  She still has some edema with peripheral neuropathy but she is now starting to try to walk on her treadmill and work on the stationary bicycle with better overall energy levels. She has a little bit of orthopnea but no PND.  No chest pain or pressure with exertion.  CV Review of Symptoms (Summary) Cardiovascular ROS: positive for - dyspnea on exertion and Overall improving exercise tolerance but still intolerance.  Mild pedal edema with peripheral neuropathy. negative for - chest pain, edema, irregular heartbeat, orthopnea, palpitations, paroxysmal nocturnal dyspnea, rapid heart rate, shortness of breath or Syncope/near syncope, TIA/amaurosis fugax, claudication  The patient DOES NOT have symptoms concerning for COVID-19 infection (fever, chills, cough, or new shortness of breath).  The patient is practicing social distancing & Masking.   She has had her COVID-19 vaccines despite having subclinical case of COVID-19 positive in February 2021  REVIEWED OF SYSTEMS   Review of Systems  Constitutional: Positive for malaise/fatigue. Negative for weight loss (Has gained weight.).  HENT: Negative for congestion and nosebleeds.   Respiratory: Negative for cough and shortness of breath (Noted in HPI).   Cardiovascular: Positive for leg swelling (Trivial).  Gastrointestinal: Negative for abdominal pain, blood in stool and melena.  Genitourinary: Negative for hematuria.  Musculoskeletal: Positive for joint pain. Negative for falls.  Neurological: Positive for focal weakness (Legs feel weak sometimes). Negative for dizziness  and headaches.  Psychiatric/Behavioral: Negative for depression and memory loss. The patient is not nervous/anxious and does not have insomnia.    I have reviewed and (if needed) personally updated the patient's problem list, medications, allergies, past medical and surgical history, social and family history.   PAST MEDICAL HISTORY   Past Medical History:  Diagnosis Date  . Arthritis   . Asthma    Never smoker  . Blood transfusion    over 40 y ears  . Breast mass, left   . Cancer ([facility])   . History of radiation therapy 09/07/19- 10/06/19   Left Breast 15 fractions of 2.67 Gy to total 40.05 Gy. Left breast boost 5 fractions of 2 Gy each to total 10 Gy  . Hypertension    on meds  . Hypothyroidism    on meds  . Pre-diabetes     PAST SURGICAL HISTORY   Past Surgical History:  Procedure Laterality Date  . ABDOMINAL HYSTERECTOMY     32 yrs  . BREAST LUMPECTOMY WITH RADIOACTIVE SEED AND SENTINEL LYMPH NODE BIOPSY Left 07/27/2019   Procedure: LEFT BREAST LUMPECTOMY WITH RADIOACTIVE SEED AND LEFT SENTINEL LYMPH NODE MAPPING;  Surgeon: Erroll Luna, MD;  Location: [place];  Service: General;  Laterality: Left;  . BREAST LUMPECTOMY WITH RADIOACTIVE SEED LOCALIZATION Left 08/11/2016   Procedure: LEFT BREAST LUMPECTOMY WITH RADIOACTIVE SEED LOCALIZATION;  Surgeon: Erroll Luna, MD;  Location: [place];  Service: General;  Laterality: Left;  . CATARACT EXTRACTION, BILATERAL    . CORONARY CALCIUM SCORE-CT ANGIOGRAM  04/2019   Small, < 34mm nodule noted.  Difficult study to read.  Coronary calcium score was 11.  Significant motion artifact, but visible territory showed minimal disease.  Minimal calcium noted in the ostial LAD but no suggestion of significant CAD.  Marland Kitchen EYE SURGERY    . JOINT REPLACEMENT Bilateral    left knee 2012  . KNEE ARTHROPLASTY  02/17/2012   Procedure: COMPUTER ASSISTED TOTAL KNEE ARTHROPLASTY;  Surgeon: Meredith Pel, MD;  Location: [place];   Service: Orthopedics;  Laterality: Right;  Right total knee replacement    14-day ZIO patch monitor July 2020: Several short bursts of PAT as well as occasional sinus bradycardia (mostly during sleeping hours).  One was midday.  Second half of the duration she was off of diltiazem and noted better heart rate at baseline, less bradycardia and more heart responsiveness..  Fastest sinus rhythm rate was 88 bpm.  MEDICATIONS/ALLERGIES   Current Meds  Medication Sig  . acetaminophen (TYLENOL) 500 MG tablet Take 1,000 mg by mouth every 6 (six) hours as needed for moderate pain.   Marland Kitchen albuterol (PROVENTIL HFA;VENTOLIN HFA) 108 (90 Base) MCG/ACT inhaler Inhale 1-2 puffs into the lungs every 6 (six) hours as needed for wheezing or shortness of breath.   . Artificial Tear Solution (SOOTHE XP OP) Place 1 drop into both eyes 3 (three) times daily as needed (dry or irritated eyes).  . Biotin 5 MG TABS Take 5 mg by mouth daily.   . cetaphil (CETAPHIL) cream Apply 1 application topically 2 (two) times daily.   . diphenhydrAMINE HCl (ZZZQUIL) 50 MG/30ML LIQD Take 50 mg by mouth at bedtime as needed (sleep).   . fluticasone (FLONASE) 50 MCG/ACT nasal spray  Place 2 sprays into both nostrils daily as needed for allergies or rhinitis.  . furosemide (LASIX) 20 MG tablet Take 20 mg by mouth daily as needed for fluid or edema.  Marland Kitchen levothyroxine (SYNTHROID, LEVOTHROID) 88 MCG tablet Take 88 mcg by mouth daily before breakfast.   . meclizine (ANTIVERT) 25 MG tablet Take 12.5 mg by mouth 3 (three) times daily as needed for dizziness.  . Multiple Vitamins-Minerals (ICAPS AREDS 2 PO) Take 1 capsule by mouth 2 (two) times daily.   . potassium chloride SA (KLOR-CON) 20 MEQ tablet Take 20 mEq by mouth daily as needed (when taking furosemide).  Marland Kitchen spironolactone (ALDACTONE) 25 MG tablet Take 1 tablet by mouth in the morning.    Allergies  Allergen Reactions  . Lisinopril Itching    No energy  . Penicillins Itching and Rash      Has patient had a PCN reaction causing immediate rash, facial/tongue/throat swelling, SOB or lightheadedness with hypotension: No Has patient had a PCN reaction causing severe rash involving mucus membranes or skin necrosis: No Has patient had a PCN reaction that required hospitalization: No Has patient had a PCN reaction occurring within the last 10 years: No If all of the above answers are "NO", then may proceed with Cephalosporin use.  . Sulfa Antibiotics Itching and Rash    SOCIAL HISTORY/FAMILY HISTORY   Reviewed in Epic:  Pertinent findings: No new changes.  OBJCTIVE -PE, EKG, labs   Wt Readings from Last 3 Encounters:  06/12/20 223 lb 12.8 oz (101.5 kg)  03/12/20 216 lb 12.8 oz (98.3 kg)  02/13/20 216 lb 4.8 oz (98.1 kg)    Physical Exam: BP (!) 166/68   Pulse (!) 58   Wt 223 lb 12.8 oz (101.5 kg)   BMI 37.82 kg/m  Physical Exam Vitals reviewed.  Constitutional:      General: She is not in acute distress.    Appearance: She is well-developed.     Comments: Well-groomed.  Moderately obese.  Relatively healthy-appearing  HENT:     Head: Normocephalic and atraumatic.  Neck:     Vascular: No carotid bruit, hepatojugular reflux or JVD.  Cardiovascular:     Rate and Rhythm: Regular rhythm. Bradycardia present.  No extrasystoles are present.    Chest Wall: PMI is not displaced.     Pulses: Intact distal pulses.     Heart sounds: S1 normal and S2 normal. Heart sounds are distant. Murmur (Cannot exclude soft 1/6 SEM at RUSB) heard.  No gallop.   Pulmonary:     Effort: Pulmonary effort is normal. No respiratory distress.     Breath sounds: No wheezing or rales.  Chest:     Chest wall: No tenderness.  Musculoskeletal:        General: Normal range of motion.     Cervical back: Normal range of motion and neck supple.  Neurological:     Mental Status: She is alert and oriented to person, place, and time.     Cranial Nerves: No cranial nerve deficit.  Psychiatric:         Behavior: Behavior normal.        Thought Content: Thought content normal.        Judgment: Judgment normal.     Adult ECG Report  Rate: 50 ;  Rhythm: sinus bradycardia, sinus arrhythmia and 1 degree AV block.  Otherwise normal axis intervals and durations.;   Narrative Interpretation: Borderline EKG.  Recent Labs: I do not have any labs  since 2018.  She says that her PCP is checking routinely. No results found for: CHOL, HDL, LDLCALC, LDLDIRECT, TRIG, CHOLHDL Lab Results  Component Value Date   CREATININE 0.75 02/13/2020   BUN 15 02/13/2020   NA 142 02/13/2020   K 3.9 02/13/2020   CL 109 02/13/2020   CO2 23 02/13/2020   No results found for: TSH  ASSESSMENT/PLAN    Problem List Items Addressed This Visit    Diastolic dysfunction with heart failure ([place]) (Chronic)    She does have exertional dyspnea but otherwise no heart failure symptoms.  Diastolic dysfunction could certainly be part of the reason along with deconditioning.  Unfortunately, medication intolerance has been a major issue.  Off any rate control agents because of fatigue. Did not tolerate amlodipine because of fatigue. We do need to start on an ARB along with spironolactone.  Can titrate up.      Relevant Medications   losartan (COZAAR) 25 MG tablet   Bradycardia with 41-50 beats per minute (Chronic)    Bradycardia still present, no longer on beta-blocker.      Essential hypertension - Primary (Chronic)    Biggest issue from last visit was concern for her hypertension.  Now with echo showing grade 2 diastolic dysfunction.  We do need to control her pressures.  Plan:  Continue spironolactone 25 mg.  Change Lasix to as needed.  We will initiate 1200 mg losartan then and gradually increase to 25 mg. ->  I will have her follow-up with our [facility] clinical pharmacist to adjust medications.   FOLLOW THIS INSTRUCTIONS FOR LOSARTAN 25 MG  FOR ONE MONTH ( 4 WEEKS )  TAKE  12.5 MG ( 1/2TABLET 25 MG)  , THEN    2 WEEKS  ALTERNATE TAKE( 25 MG ) ONE TABLET LOSARTAN  ,ONE DAY THEN NEXT DAY TAKE (12.5 MG) HALF OF TABLET  LOSARTAN  THEN START TAKING LOSARTAN 2 5MG   ONE TABLET DAILY       Relevant Medications   losartan (COZAAR) 25 MG tablet   Other Relevant Orders   AMB Referral to [facility] Pharm-D    Other Visit Diagnoses    Medication management       Relevant Orders   AMB Referral to [facility] Pharm-D      COVID-19 Education: The signs and symptoms of COVID-19 were discussed with the patient and how to seek care for testing (follow up with PCP or arrange E-visit).   The importance of social distancing and COVID-19 vaccination was discussed today.  I spent a total of 85minutes with the patient. >  50% of the time was spent in direct patient consultation.  Additional time spent with chart review  / charting (studies, outside notes, etc): 8 Total Time: 29 min   Current medicines are reviewed at length with the patient today.  (+/- concerns) none  Notice: This dictation was prepared with Dragon dictation along with smaller phrase technology. Any transcriptional errors that result from this process are unintentional and may not be corrected upon review.  Patient Instructions / Medication Changes & Studies & Tests Ordered   Patient Instructions  Medication Instructions:   CONTINUE TO TAKE SPIRONOLACTONE 25 MG  DAILY  ONLY USE FUROSEMIDE ON AS NEEDED BASIS   FOLLOW THIS INSTRUCTIONS FOR LOSARTAN 25 MG  FOR ONE MONTH ( 4 WEEKS )  TAKE  12.5 MG ( 1/2TABLET 25 MG)  , THEN   2 WEEKS  ALTERNATE TAKE( 25 MG ) ONE TABLET LOSARTAN  ,  ONE DAY THEN NEXT DAY TAKE (12.5 MG) HALF OF TABLET  LOSARTAN  THEN START TAKING LOSARTAN 2 5MG   ONE TABLET DAILY   NO OTHER CHANGES  *If you need a refill on your cardiac medications before your next appointment, please call your pharmacy*   Lab Work: NOT NEEDED   Testing/Procedures: NOT NEEDED   Follow-Up: At [facility], you and your health needs  are our priority.  As part of our continuing mission to provide you with exceptional heart care, we have created designated Provider Care Teams.  These Care Teams include your primary Cardiologist (physician) and Advanced Practice Providers (APPs -  Physician Assistants and Nurse Practitioners) who all work together to provide you with the care you need, when you need it.     Your next appointment:   5 month(s) JAN 2022  The format for your next appointment:   In Person  Provider:   Glenetta Hew, MD    Studies Ordered:   Orders Placed This Encounter  Procedures  . AMB Referral to [facility] Pharm-D     Glenetta Hew, M.D., M.S. Interventional Cardiologist   Pager # [phone] Phone # [phone] [address] [place], [place] [address]   Thank you for choosing [facility] at [facility]!!

## 2020-06-16 ENCOUNTER — Encounter: Payer: Self-pay | Admitting: Cardiology

## 2020-06-16 DIAGNOSIS — I5030 Unspecified diastolic (congestive) heart failure: Secondary | ICD-10-CM | POA: Insufficient documentation

## 2020-06-16 DIAGNOSIS — I110 Hypertensive heart disease with heart failure: Secondary | ICD-10-CM | POA: Insufficient documentation

## 2020-06-16 NOTE — Assessment & Plan Note (Addendum)
Biggest issue from last visit was concern for her hypertension.  Now with echo showing grade 2 diastolic dysfunction.  We do need to control her pressures.  Plan:  Continue spironolactone 25 mg.  Change Lasix to as needed.  We will initiate 1200 mg losartan then and gradually increase to 25 mg. ->  I will have her follow-up with our CV[facility] clinical pharmacist to adjust medications.   FOLLOW THIS INSTRUCTIONS FOR LOSARTAN 25 MG  FOR ONE MONTH ( 4 WEEKS )  TAKE  12.5 MG ( 1/2TABLET 25 MG)  , THEN   2 WEEKS  ALTERNATE TAKE( 25 MG ) ONE TABLET LOSARTAN  ,ONE DAY THEN NEXT DAY TAKE (12.5 MG) HALF OF TABLET  LOSARTAN  THEN START TAKING LOSARTAN 2 5MG   ONE TABLET DAILY

## 2020-06-16 NOTE — Assessment & Plan Note (Signed)
She does have exertional dyspnea but otherwise no heart failure symptoms.  Diastolic dysfunction could certainly be part of the reason along with deconditioning.  Unfortunately, medication intolerance has been a major issue.  Off any rate control agents because of fatigue. Did not tolerate amlodipine because of fatigue. We do need to start on an ARB along with spironolactone.  Can titrate up.

## 2020-06-16 NOTE — Assessment & Plan Note (Signed)
Bradycardia still present, no longer on beta-blocker.

## 2020-06-18 ENCOUNTER — Ambulatory Visit
Admission: RE | Admit: 2020-06-18 | Discharge: 2020-06-18 | Disposition: A | Discharge status: Home / Self Care | Payer: Medicare HMO | Source: Ambulatory Visit | Attending: Oncology | Admitting: Oncology

## 2020-06-18 ENCOUNTER — Other Ambulatory Visit: Payer: Self-pay

## 2020-06-18 DIAGNOSIS — C50412 Malignant neoplasm of upper-outer quadrant of left female breast: Secondary | ICD-10-CM

## 2020-06-18 DIAGNOSIS — R928 Other abnormal and inconclusive findings on diagnostic imaging of breast: Secondary | ICD-10-CM | POA: Diagnosis not present

## 2020-06-18 DIAGNOSIS — Z171 Estrogen receptor negative status [ER-]: Secondary | ICD-10-CM

## 2020-06-18 DIAGNOSIS — Z853 Personal history of malignant neoplasm of breast: Secondary | ICD-10-CM | POA: Diagnosis not present

## 2020-08-10 NOTE — Progress Notes (Signed)
Patient ID: Alisha [name]                 DOB: Aug 26, 1935                      MRN: [id number]     HPI: Alisha [name] is a 85 y.o. female referred by Dr. Ellyn [name] to HTN clinic. PMH is significant for chronic bradycardia, diastolic dysfunction, HTN, Breast cancer w/ radiation, hypothyroidism, pre-DM.   Patient was seen 03/13/19 for symptoms of exertional dyspnea and fatigue since developing COVID-19 that is exacerbated by exercise. She also endorsed a 40lb weight gain x42yrs. The patient was noted to have some bradycardia so her atenolol was decreased and eventually discontinued. She was also initiated on valsartan-HCTZ (no energy) which was ultimately switched to amlodipine 2.5mg  (endorsed fatigue). Spironolactone & losartan were initiated in 05/2020. TTE April 02, 2020 showed normal LVEF. At the patient's last visit 06/12/20, the patient's BP was still elevated at 166/68.   The patient is in good spirits today. She denies endorses dizziness when standing (worse in the AM) that has been occurring for many years. Denies headaches or blurred vision. She states that Dr. Ellyn [name] initiated her on Losartan 12.5mg  QOD w/ spironolactone 25mg  daily 2 mos ago and states that she has not increased the dose of her losartan since that visit (she was supposed to titrate the medication). The patient endorses a long history of inability to tolerate anti-HTN medications. She states that with each previously tried medication she has developed significant fatigue and itchiness that she states goes away when the medication is discontinued. She states that this itchiness is typically accompanied by a rash. The patient confirmed that her only known allergies are PCN & Bactrim. Further, the pt states that she tolerates her levothyroxine, furosemide & KCL without these attributed symptoms. Of note, the patient uses cedafil for very dry skin and "soap for seniors" that she feels has helped with her itchiness. Further, the  patient believes that she has been less itchy since decreasing her milk intake. When asked, the patient endorses tolerating her current losartan & spironolactone w/ minimal itching and fatigue.  Current HTN meds: losartan 12.5mg  QOD, spironolactone 25mg  (both 10AM)  * PRN Lasix 20mg      Previously tried: lisinopril (low energy), valsartan-HCTZ (low energy, itchiness documented 04/30/20), amlodipine 2.5mg  (fatique, dizziness, "skin issues") BP goal: <130/80  Family History:  - Maternal & paternal HTN - no FH of CVD  Social History:  - no alcohol - no smoking   Diet:  - Veggies, Beans --> frozen/fresh,  - Meat: chicken & Fish with occasional burger (1x/mo) -  Salads (home made) - She went to a health class that told her to avoid large amounts of dressing -  Low salt if she eats canned things - does not salt food  - does not eat out   Drinks: - 1 cup of coffee/day - soda - 1/2 per day  - Orange juice  Exercise: change in 1 wks - restarted exercising  - 10 minutes / day with increase to 15 min in 1 mo - has exercise bike   Home BP readings:  154/70 154/66 151/60 138/65 114/66 155/73 149/92 HR 40-65 * 1hr after BP meds   Wt Readings from Last 3 Encounters:  06/12/20 223 lb 12.8 oz (101.5 kg)  03/12/20 216 lb 12.8 oz (98.3 kg)  02/13/20 216 lb 4.8 oz (98.1 kg)   BP Readings from Last 3  Encounters:  06/12/20 (!) 166/68  03/12/20 (!) 156/84  02/13/20 (!) 187/83   Pulse Readings from Last 3 Encounters:  06/12/20 (!) 58  03/12/20 (!) 50  02/13/20 81    Renal function: CrCl cannot be calculated (Patient's most recent lab result is older than the maximum 21 days allowed.).  Past Medical History:  Diagnosis Date  . Arthritis   . Asthma    Never smoker  . Blood transfusion    over 40 y ears  . Breast mass, left   . Cancer ([place])   . History of radiation therapy 09/07/19- 10/06/19   Left Breast 15 fractions of 2.67 Gy to total 40.05 Gy. Left breast boost 5  fractions of 2 Gy each to total 10 Gy  . Hypertension    on meds  . Hypothyroidism    on meds  . Pre-diabetes     Current Outpatient Medications on File Prior to Visit  Medication Sig Dispense Refill  . acetaminophen (TYLENOL) 500 MG tablet Take 1,000 mg by mouth every 6 (six) hours as needed for moderate pain.     Marland Kitchen albuterol (PROVENTIL HFA;VENTOLIN HFA) 108 (90 Base) MCG/ACT inhaler Inhale 1-2 puffs into the lungs every 6 (six) hours as needed for wheezing or shortness of breath.     Marland Kitchen amLODipine (NORVASC) 2.5 MG tablet Take 1 tablet (2.5 mg total) by mouth daily. 90 tablet 1  . Artificial Tear Solution (SOOTHE XP OP) Place 1 drop into both eyes 3 (three) times daily as needed (dry or irritated eyes).    . Biotin 5 MG TABS Take 5 mg by mouth daily.     . cetaphil (CETAPHIL) cream Apply 1 application topically 2 (two) times daily.     . diphenhydrAMINE HCl (ZZZQUIL) 50 MG/30ML LIQD Take 50 mg by mouth at bedtime as needed (sleep).     . fluticasone (FLONASE) 50 MCG/ACT nasal spray Place 2 sprays into both nostrils daily as needed for allergies or rhinitis.    . furosemide (LASIX) 20 MG tablet Take 20 mg by mouth daily as needed for fluid or edema.    Marland Kitchen levothyroxine (SYNTHROID, LEVOTHROID) 88 MCG tablet Take 88 mcg by mouth daily before breakfast.     . losartan (COZAAR) 25 MG tablet Take 1 tablet (25 mg total) by mouth daily. 90 tablet 3  . meclizine (ANTIVERT) 25 MG tablet Take 12.5 mg by mouth 3 (three) times daily as needed for dizziness.    . Multiple Vitamins-Minerals (ICAPS AREDS 2 PO) Take 1 capsule by mouth 2 (two) times daily.     . potassium chloride SA (KLOR-CON) 20 MEQ tablet Take 20 mEq by mouth daily as needed (when taking furosemide).    Marland Kitchen spironolactone (ALDACTONE) 25 MG tablet Take 1 tablet by mouth in the morning. 45 tablet 3   No current facility-administered medications on file prior to visit.    Allergies  Allergen Reactions  . Lisinopril Itching    No energy    . Penicillins Itching and Rash    Has patient had a PCN reaction causing immediate rash, facial/tongue/throat swelling, SOB or lightheadedness with hypotension: No Has patient had a PCN reaction causing severe rash involving mucus membranes or skin necrosis: No Has patient had a PCN reaction that required hospitalization: No Has patient had a PCN reaction occurring within the last 10 years: No If all of the above answers are "NO", then may proceed with Cephalosporin use.  . Sulfa Antibiotics Itching and Rash  Vitals obtained during today's visit: (had not yet taken BP meds) BP 158/76 HR 60 SPO2 98%   Assessment/Plan:  1. Hypertension -  The patient's blood pressure is persistently elevated above goal (<130/80) per home BP & office BP readings. Patient's extensive list of intolerances to anti-htn medications w/ sub-therapeutic dosing of current medications, makes further titration of currently tolerated medication preferable today. Though patient's rash and itchiness may be more attributable to the patient's dry skin (and possible atopy given asthma), the fatigue experienced with prior interventions suggests a slow titration is preferable in this patient despite BP significantly above goal. As such, will increase losartan to 12.5 mg daily and continue spironolactone 25 mg daily.   Requested the patient take BP readings 2hrs post medications (at minimum), continue current diet which aligns with the mediterranean diet, decrease salt intake to <2gm/day, increase exercise to minimum 150 minutes / wk of moderate intensity. Requested patient bring in home BP cuff as patient stated that her cuff was normal size and thought it might be too small for her.   BMET today to assess potassium and Scr after spironolactone initiation and with losartan increase. Will call patient w/ results and to schedule f/u appointment (3wks) for assessment of tolerance and further titration. Also provided patient new  prescriptions given current medications expired >52yrs ago & instructed patient to discard current expired supply.   Thank you,  Otho Darner, pharmacy student class of [address] [place], [place].D, BCPS, CPP [place]  [address] [place], [place] [address]  Phone: [phone]; Fax: [phone]

## 2020-08-13 ENCOUNTER — Other Ambulatory Visit: Payer: Self-pay

## 2020-08-13 ENCOUNTER — Ambulatory Visit (INDEPENDENT_AMBULATORY_CARE_PROVIDER_SITE_OTHER): Payer: Medicare HMO | Admitting: Pharmacist

## 2020-08-13 VITALS — BP 158/76 | HR 60

## 2020-08-13 DIAGNOSIS — I10 Essential (primary) hypertension: Secondary | ICD-10-CM

## 2020-08-13 LAB — BASIC METABOLIC PANEL
BUN/Creatinine Ratio: 21 (ref 12–28)
BUN: 17 mg/dL (ref 8–27)
CO2: 20 mmol/L (ref 20–29)
Calcium: 9.6 mg/dL (ref 8.7–10.3)
Chloride: 106 mmol/L (ref 96–106)
Creatinine, Ser: 0.81 mg/dL (ref 0.57–1.00)
GFR calc Af Amer: 78 mL/min/{1.73_m2} (ref >59)
GFR calc non Af Amer: 67 mL/min/{1.73_m2} (ref >59)
Glucose: 132 mg/dL — ABNORMAL HIGH (ref 65–99)
Potassium: 4.5 mmol/L (ref 3.5–5.2)
Sodium: 140 mmol/L (ref 134–144)

## 2020-08-13 MED ORDER — SPIRONOLACTONE 25 MG PO TABS: 25.0000 mg | ORAL_TABLET | Freq: Every day | ORAL | 3 refills | Status: DC

## 2020-08-13 MED ORDER — LOSARTAN POTASSIUM 25 MG PO TABS: 12.5000 mg | ORAL_TABLET | Freq: Every day | ORAL | 3 refills | Status: DC

## 2020-08-13 NOTE — Patient Instructions (Addendum)
Thank you for seeing [place] today!  Your blood pressure is still elevated today. I would like you to:  Continue taking Spironolactone 25mg  daily START taking losartan 12.5mg  daily (1/2 tablet)  Great job improving your diet. If you want to learn more about a heart healthy diet, you can research the mediterranean diet. Our main concern is limiting your salt to <2 grams every day and drinking no more than 1 cup of caffeine daily. Try to increase your exercise to 150 minutes every week with 2-3 days of resistence training or weight lifting.   Continue to take your BP readings like you have been. Try to get a reading 2 hrs after you take your medications.   If you develop fatigue and itchiness that becomes too much to deal with, please call use directly at [phone].   Thank you! Abby

## 2020-08-14 ENCOUNTER — Telehealth: Payer: Self-pay

## 2020-08-14 NOTE — Telephone Encounter (Signed)
Spoke with patient regarding recent BMET. Informed patient that labs look good and instructed the patient to continue her current BP medications. Scheduled f/u appointment for HTN clininc November 18th, 09:30AM.

## 2020-09-04 ENCOUNTER — Encounter: Payer: Medicare HMO | Admitting: Adult Health

## 2020-09-04 ENCOUNTER — Inpatient Hospital Stay: Payer: Medicare HMO

## 2020-09-05 ENCOUNTER — Encounter: Payer: Self-pay | Admitting: Adult Health

## 2020-09-05 ENCOUNTER — Inpatient Hospital Stay: Payer: Medicare HMO | Attending: Oncology

## 2020-09-05 ENCOUNTER — Other Ambulatory Visit: Payer: Self-pay

## 2020-09-05 ENCOUNTER — Inpatient Hospital Stay (HOSPITAL_BASED_OUTPATIENT_CLINIC_OR_DEPARTMENT_OTHER): Payer: Medicare HMO | Admitting: Adult Health

## 2020-09-05 ENCOUNTER — Ambulatory Visit (HOSPITAL_COMMUNITY)
Admission: RE | Admit: 2020-09-05 | Discharge: 2020-09-05 | Disposition: A | Discharge status: Home / Self Care | Payer: Medicare HMO | Source: Ambulatory Visit | Attending: Adult Health | Admitting: Adult Health

## 2020-09-05 VITALS — BP 178/78 | HR 61 | Temp 97.3°F | Resp 18 | Ht 64.5 in | Wt 228.7 lb

## 2020-09-05 DIAGNOSIS — C50412 Malignant neoplasm of upper-outer quadrant of left female breast: Secondary | ICD-10-CM

## 2020-09-05 DIAGNOSIS — Z833 Family history of diabetes mellitus: Secondary | ICD-10-CM | POA: Diagnosis not present

## 2020-09-05 DIAGNOSIS — I10 Essential (primary) hypertension: Secondary | ICD-10-CM | POA: Diagnosis not present

## 2020-09-05 DIAGNOSIS — E039 Hypothyroidism, unspecified: Secondary | ICD-10-CM | POA: Insufficient documentation

## 2020-09-05 DIAGNOSIS — Z923 Personal history of irradiation: Secondary | ICD-10-CM | POA: Insufficient documentation

## 2020-09-05 DIAGNOSIS — Z171 Estrogen receptor negative status [ER-]: Secondary | ICD-10-CM | POA: Diagnosis not present

## 2020-09-05 DIAGNOSIS — Z853 Personal history of malignant neoplasm of breast: Secondary | ICD-10-CM | POA: Insufficient documentation

## 2020-09-05 DIAGNOSIS — Z79899 Other long term (current) drug therapy: Secondary | ICD-10-CM | POA: Diagnosis not present

## 2020-09-05 DIAGNOSIS — J45909 Unspecified asthma, uncomplicated: Secondary | ICD-10-CM | POA: Insufficient documentation

## 2020-09-05 DIAGNOSIS — R0602 Shortness of breath: Secondary | ICD-10-CM | POA: Diagnosis not present

## 2020-09-05 DIAGNOSIS — Z8249 Family history of ischemic heart disease and other diseases of the circulatory system: Secondary | ICD-10-CM | POA: Insufficient documentation

## 2020-09-05 DIAGNOSIS — I517 Cardiomegaly: Secondary | ICD-10-CM | POA: Diagnosis not present

## 2020-09-05 LAB — CBC WITH DIFFERENTIAL/PLATELET
Abs Immature Granulocytes: 0.03 10*3/uL (ref 0.00–0.07)
Basophils Absolute: 0.1 10*3/uL (ref 0.0–0.1)
Basophils Relative: 1 %
Eosinophils Absolute: 0.5 10*3/uL (ref 0.0–0.5)
Eosinophils Relative: 5 %
HCT: 36.8 % (ref 36.0–46.0)
Hemoglobin: 11.7 g/dL — ABNORMAL LOW (ref 12.0–15.0)
Immature Granulocytes: 0 %
Lymphocytes Relative: 26 %
Lymphs Abs: 2.7 10*3/uL (ref 0.7–4.0)
MCH: 30.6 pg (ref 26.0–34.0)
MCHC: 31.8 g/dL (ref 30.0–36.0)
MCV: 96.3 fL (ref 80.0–100.0)
Monocytes Absolute: 0.9 10*3/uL (ref 0.1–1.0)
Monocytes Relative: 9 %
Neutro Abs: 6 10*3/uL (ref 1.7–7.7)
Neutrophils Relative %: 59 %
Platelets: 250 10*3/uL (ref 150–400)
RBC: 3.82 MIL/uL — ABNORMAL LOW (ref 3.87–5.11)
RDW: 13 % (ref 11.5–15.5)
WBC: 10.1 10*3/uL (ref 4.0–10.5)
nRBC: 0 % (ref 0.0–0.2)

## 2020-09-05 LAB — COMPREHENSIVE METABOLIC PANEL
ALT: 13 U/L (ref 0–44)
AST: 15 U/L (ref 15–41)
Albumin: 3.9 g/dL (ref 3.5–5.0)
Alkaline Phosphatase: 85 U/L (ref 38–126)
Anion gap: 10 (ref 5–15)
BUN: 16 mg/dL (ref 8–23)
CO2: 23 mmol/L (ref 22–32)
Calcium: 9.1 mg/dL (ref 8.9–10.3)
Chloride: 108 mmol/L (ref 98–111)
Creatinine, Ser: 0.81 mg/dL (ref 0.44–1.00)
GFR, Estimated: >60 mL/min (ref >60)
Glucose, Bld: 176 mg/dL — ABNORMAL HIGH (ref 70–99)
Potassium: 4.3 mmol/L (ref 3.5–5.1)
Sodium: 141 mmol/L (ref 135–145)
Total Bilirubin: 0.2 mg/dL — ABNORMAL LOW (ref 0.3–1.2)
Total Protein: 6.6 g/dL (ref 6.5–8.1)

## 2020-09-05 NOTE — Progress Notes (Signed)
SURVIVORSHIP VISIT:   BRIEF ONCOLOGIC HISTORY:  Oncology History  Malignant neoplasm of upper-outer quadrant of left breast in female, estrogen receptor negative (HCC)  06/22/2019 Cancer Staging   Staging form: Breast, AJCC 8th Edition - Clinical stage from 06/22/2019: Stage IB (cT1b, cN0, cM0, G3, ER-, PR-, HER2-)   06/29/2019 Initial Diagnosis   Malignant neoplasm of upper-outer quadrant of left breast in female, estrogen receptor negative (HCC)   07/27/2019 Surgery   Left lumpectomy ([facility]) ([id number]): IDC, 0.9 cm, clear margins. 2 lymph nodes were negative for carcinoma. Triple negative. Ki-67 was 30%.   09/07/2019 - 10/06/2019 Radiation Therapy   The patient initially received a dose of 40.05 Gy in 15 fractions to the breast using whole-breast tangent fields. This was delivered using a 3-D conformal technique. The pt received a boost delivering an additional 10 Gy in 5 fractions using a electron boost with electrons. The total dose was 50.05 Gy.   08/26/2020 Cancer Staging   Staging form: Breast, AJCC 8th Edition - Pathologic: Stage IB (pT1b, pN0, cM0, G3, ER-, PR-, HER2-)     INTERVAL HISTORY:  Alisha [name] to review her survivorship care plan detailing her treatment course for breast cancer, as well as monitoring long-term side effects of that treatment, education regarding health maintenance, screening, and overall wellness and health promotion.     Overall, Alisha [name] reports feeling quite tired and increased DOE.  She notes that right after she completed her adjuvant radiation therapy she came down with COVID and she has been tired and short of breath ever since.  She notes she is very stressed out from her bills related to her health and she is overeating secondary to this.  She notes she sleeps well and a detailed ROS was otherwise non contributory.   REVIEW OF SYSTEMS:  Review of Systems  Constitutional: Positive for fatigue. Negative for appetite change, chills, fever  and unexpected weight change.  HENT:   Negative for hearing loss, lump/mass and trouble swallowing.   Eyes: Negative for eye problems and icterus.  Respiratory: Positive for shortness of breath. Negative for chest tightness and cough.   Cardiovascular: Negative for chest pain, leg swelling and palpitations.  Gastrointestinal: Negative for abdominal distention, abdominal pain, constipation, diarrhea, nausea and vomiting.  Endocrine: Negative for hot flashes.  Genitourinary: Negative for difficulty urinating.   Musculoskeletal: Negative for arthralgias.  Skin: Negative for itching and rash.  Neurological: Negative for dizziness, extremity weakness, headaches and numbness.  Hematological: Negative for adenopathy. Does not bruise/bleed easily.  Psychiatric/Behavioral: Positive for depression. The patient is not nervous/anxious.    Breast: Denies any new nodularity, masses, tenderness, nipple changes, or nipple discharge.      ONCOLOGY TREATMENT TEAM:  1. Surgeon:  Dr. Luisa Hart at [facility] 2. Medical Oncologist: Dr. Darnelle Catalan  3. Radiation Oncologist: Dr. Luisa Hart    PAST MEDICAL/SURGICAL HISTORY:  Past Medical History:  Diagnosis Date  . Arthritis   . Asthma    Never smoker  . Blood transfusion    over 42 y ears  . Breast mass, left   . Cancer (HCC)   . History of radiation therapy 09/07/19- 10/06/19   Left Breast 15 fractions of 2.67 Gy to total 40.05 Gy. Left breast boost 5 fractions of 2 Gy each to total 10 Gy  . Hypertension    on meds  . Hypothyroidism    on meds  . Pre-diabetes    Past Surgical History:  Procedure Laterality Date  . ABDOMINAL  HYSTERECTOMY     32 yrs  . BREAST LUMPECTOMY WITH RADIOACTIVE SEED AND SENTINEL LYMPH NODE BIOPSY Left 07/27/2019   Procedure: LEFT BREAST LUMPECTOMY WITH RADIOACTIVE SEED AND LEFT SENTINEL LYMPH NODE MAPPING;  Surgeon: Harriette Bouillon, MD;  Location: [facility] OR;  Service: General;  Laterality: Left;  . BREAST LUMPECTOMY  WITH RADIOACTIVE SEED LOCALIZATION Left 08/11/2016   Procedure: LEFT BREAST LUMPECTOMY WITH RADIOACTIVE SEED LOCALIZATION;  Surgeon: Harriette Bouillon, MD;  Location: [facility];  Service: General;  Laterality: Left;  . CATARACT EXTRACTION, BILATERAL    . CORONARY CALCIUM SCORE-CT ANGIOGRAM  04/2019   Small, < 31mm nodule noted.  Difficult study to read.  Coronary calcium score was 11.  Significant motion artifact, but visible territory showed minimal disease.  Minimal calcium noted in the ostial LAD but no suggestion of significant CAD.  Marland Kitchen EYE SURGERY    . JOINT REPLACEMENT Bilateral    left knee 2012  . KNEE ARTHROPLASTY  02/17/2012   Procedure: COMPUTER ASSISTED TOTAL KNEE ARTHROPLASTY;  Surgeon: Cammy Copa, MD;  Location[facility] OR;  Service: Orthopedics;  Laterality: Right;  Right total knee replacement     ALLERGIES:  Allergies  Allergen Reactions  . Lisinopril Itching    No energy  . Penicillins Itching and Rash    Has patient had a PCN reaction causing immediate rash, facial/tongue/throat swelling, SOB or lightheadedness with hypotension: No Has patient had a PCN reaction causing severe rash involving mucus membranes or skin necrosis: No Has patient had a PCN reaction that required hospitalization: No Has patient had a PCN reaction occurring within the last 10 years: No If all of the above answers are "NO", then may proceed with Cephalosporin use.  . Sulfa Antibiotics Itching and Rash     CURRENT MEDICATIONS:  Outpatient Encounter Medications as of 09/05/2020  Medication Sig Note  . acetaminophen (TYLENOL) 500 MG tablet Take 1,000 mg by mouth every 6 (six) hours as needed for moderate pain.    Marland Kitchen albuterol (PROVENTIL HFA;VENTOLIN HFA) 108 (90 Base) MCG/ACT inhaler Inhale 1-2 puffs into the lungs every 6 (six) hours as needed for wheezing or shortness of breath.    . APPLE CIDER VINEGAR PO Take 500 mg by mouth daily.   . Artificial Tear Solution (SOOTHE XP OP)  Place 1 drop into both eyes 3 (three) times daily as needed (dry or irritated eyes).   . Biotin 5 MG TABS Take 5 mg by mouth daily.    . cetaphil (CETAPHIL) cream Apply 1 application topically 2 (two) times daily.    . diphenhydrAMINE HCl (ZZZQUIL) 50 MG/30ML LIQD Take 50 mg by mouth at bedtime as needed (sleep).    . fluticasone (FLONASE) 50 MCG/ACT nasal spray Place 2 sprays into both nostrils daily as needed for allergies or rhinitis. 08/13/2020: Uses daily  . furosemide (LASIX) 20 MG tablet Take 20 mg by mouth daily as needed for fluid or edema. 08/13/2020: Uses if she starts swelling (1-2x/month)  . levothyroxine (SYNTHROID, LEVOTHROID) 88 MCG tablet Take 88 mcg by mouth daily before breakfast.    . losartan (COZAAR) 25 MG tablet Take 0.5 tablets (12.5 mg total) by mouth daily.   . meclizine (ANTIVERT) 25 MG tablet Take 12.5 mg by mouth 3 (three) times daily as needed for dizziness. 08/13/2020: PRN vertigo spells  . Multiple Vitamins-Minerals (ICAPS AREDS 2 PO) Take 1 capsule by mouth 2 (two) times daily.    . potassium chloride SA (KLOR-CON) 20 MEQ tablet Take  20 mEq by mouth daily as needed (when taking furosemide). 08/13/2020: Only takes with lasix  . spironolactone (ALDACTONE) 25 MG tablet Take 1 tablet (25 mg total) by mouth daily.   Marland Kitchen amLODipine (NORVASC) 2.5 MG tablet Take 1 tablet (2.5 mg total) by mouth daily. (Patient not taking: Reported on 08/13/2020)    No facility-administered encounter medications on file as of 09/05/2020.     ONCOLOGIC FAMILY HISTORY:  Family History  Problem Relation Age of Onset  . Other Mother        died @ 28 - but was healthy  . Hypertension Sister   . Diabetes Sister   . Diabetes Brother        no longer on medications.   . Anesthesia problems Neg Hx   . Hypotension Neg Hx   . Malignant hyperthermia Neg Hx   . Pseudochol deficiency Neg Hx      GENETIC COUNSELING/TESTING: Indicated, however patient would like to wait until next year to  discuss again due to increased financial responsibility from treatments.    SOCIAL HISTORY:  Social History   Socioeconomic History  . Marital status: Single    Spouse name: Not on file  . Number of children: Not on file  . Years of education: Not on file  . Highest education level: Not on file  Occupational History  . Not on file  Tobacco Use  . Smoking status: Never Smoker  . Smokeless tobacco: Never Used  Vaping Use  . Vaping Use: Never used  Substance and Sexual Activity  . Alcohol use: No  . Drug use: No  . Sexual activity: Not Currently  Other Topics Concern  . Not on file  Social History Narrative   No longer exercising - since "got tired" - previously did elliptical, exercise bike etc.    While working - used worked gym   Social Determinants of Corporate investment banker Strain:   . Difficulty of Paying Living Expenses: Not on file  Food Insecurity:   . Worried About Programme researcher, broadcasting/film/video in the Last Year: Not on file  . Ran Out of Food in the Last Year: Not on file  Transportation Needs:   . Lack of Transportation (Medical): Not on file  . Lack of Transportation (Non-Medical): Not on file  Physical Activity:   . Days of Exercise per Week: Not on file  . Minutes of Exercise per Session: Not on file  Stress:   . Feeling of Stress : Not on file  Social Connections:   . Frequency of Communication with Friends and Family: Not on file  . Frequency of Social Gatherings with Friends and Family: Not on file  . Attends Religious Services: Not on file  . Active Member of Clubs or Organizations: Not on file  . Attends Banker Meetings: Not on file  . Marital Status: Not on file  Intimate Partner Violence:   . Fear of Current or Ex-Partner: Not on file  . Emotionally Abused: Not on file  . Physically Abused: Not on file  . Sexually Abused: Not on file     OBSERVATIONS/OBJECTIVE:  BP (!) 178/78   Pulse 61   Temp (!) 97.3 F (36.3 C) (Tympanic)    Resp 18   Ht 5' 4.5" (1.638 m)   Wt 228 lb 11.2 oz (103.7 kg)   SpO2 99%   BMI 38.65 kg/m  GENERAL: Patient is a well appearing female in no acute distress HEENT:  Sclerae  anicteric.  Mask in place. Neck is supple.  NODES:  No cervical, supraclavicular, or axillary lymphadenopathy palpated.  BREAST EXAM: left breast s/p lumpectomy and radiation, no sign of local recurrence, right breast benign LUNGS:  Clear to auscultation bilaterally.  No wheezes or rhonchi. HEART:  Regular rate and rhythm. No murmur appreciated. ABDOMEN:  Soft, nontender.  Positive, normoactive bowel sounds. No organomegaly palpated. MSK:  No focal spinal tenderness to palpation. Full range of motion bilaterally in the upper extremities. EXTREMITIES:  No peripheral edema.   SKIN:  Clear with no obvious rashes or skin changes. No nail dyscrasia. NEURO:  Nonfocal. Well oriented.  Appropriate affect.    LABORATORY DATA:  None for this visit.  DIAGNOSTIC IMAGING:  None for this visit.      ASSESSMENT AND PLAN:  Ms.. [name] is a pleasant 85 y.o. female with Stage IB left breast invasive ductal carcinoma, ER-/PR-/HER2-, diagnosed in 06/2019, treated with lumpectomy and adjuvant radiation therapy.  She presents to the Survivorship Clinic for our initial meeting and routine follow-up post-completion of treatment for breast cancer.    1. Stage IB /left breast cancer:  Alisha [name] is continuing to recover from definitive treatment for breast cancer. She will follow-up with her medical oncologist, Dr. Darnelle Catalan in 07/2021 with history and physical exam per surveillance protocol.  Her mammogram is due 05/2021. Today, a comprehensive survivorship care plan and treatment summary was reviewed with the patient today detailing her breast cancer diagnosis, treatment course, potential late/long-term effects of treatment, appropriate follow-up care with recommendations for the future, and patient education resources.  A copy of this  summary, along with a letter will be sent to the patient's primary care provider via mail/fax/In Basket message after today's visit.    2. Positive distress screen: QOLCSV administered and sent to social work.  Resources given to patient.   3. Fatigue and DOE:   Could be related to COVID 19 history.  I have ordered a chest xray to evaluate.    4. Bone health:  She was given education on specific activities to promote bone health.  5. Cancer screening:  Due to Alisha [name]'s history and her age, she should receive screening for skin cancers.  The information and recommendations are listed on the patient's comprehensive care plan/treatment summary and were reviewed in detail with the patient.    6. Health maintenance and wellness promotion: Alisha [name] was encouraged to consume 5-7 servings of fruits and vegetables per day. We reviewed the "Nutrition Rainbow" handout, as well as the handout "Take Control of Your Health and Reduce Your Cancer Risk" from the American Cancer Society.  She was also encouraged to engage in moderate to vigorous exercise for 30 minutes per day most days of the week. We discussed the [employer] fitness program, which is designed for cancer survivors to help them become more physically fit after cancer treatments.  She was instructed to limit her alcohol consumption and continue to abstain from tobacco use.     7. Support services/counseling: It is not uncommon for this period of the patient's cancer care trajectory to be one of many emotions and stressors.  We discussed how this can be increasingly difficult during the times of quarantine and social distancing due to the COVID-19 pandemic.   She was given information regarding our available services and encouraged to contact me with any questions or for help enrolling in any of our support group/programs.    Follow up instructions:    -Return  to cancer center in 1 year for f/u with Dr. Jana Hakim  -Mammogram due in  05/2021 -Follow up with surgery in 01/2021 -She is welcome to return back to the Survivorship Clinic at any time; no additional follow-up needed at this time.  -Consider referral back to survivorship as a long-term survivor for continued surveillance  The patient was provided an opportunity to ask questions and all were answered. The patient agreed with the plan and demonstrated an understanding of the instructions.   Total encounter time: 30 minutes*   Wilber Bihari, NP 09/05/20 3:19 PM Medical Oncology and Hematology [facility] [place], [place] [address] Tel. [phone]    Fax. [phone]  *Total Encounter Time as defined by the Centers for Medicare and Medicaid Services includes, in addition to the face-to-face time of a patient visit (documented in the note above) non-face-to-face time: obtaining and reviewing outside history, ordering and reviewing medications, tests or procedures, care coordination (communications with other health care professionals or caregivers) and documentation in the medical record.

## 2020-09-05 NOTE — Progress Notes (Signed)
Patient ID: Alisha [name]                 DOB: [birth year]                      MRN: [id number]     HPI: Alisha [name] is a 85 y.o. female referred by [name] to HTN clinic. PMH is significant for chronic bradycardia, diastolic dysfunction, HTN, Breast cancer w/ radiation, hypothyroidism, pre-DM.   Patient was seen 03/13/19 for symptoms of exertional dyspnea and fatigue since developing COVID-19 that is exacerbated by exercise. She also endorsed a 40lb weight gain in last 57yrs. The patient was noted to have some bradycardia so her atenolol was decreased and eventually discontinued. She was also initiated on valsartan-HCTZ (no energy) which was ultimately switched to amlodipine 2.5mg  (endorsed fatigue). Spironolactone & losartan were initiated in 05/2020. TTE April 02, 2020 showed normal LVEF.   At last visit with PharmD clinic on 08/13/20 she endorsed dizziness when standing in the AM. Patient endorsed a long history of inability to tolerate anti-HTN medications due to significant fatigue and itchiness with a rash. Patient endorsed tolerating her current losartan & spironolactone w/ minimal itching and fatigue. Patient reported less itchiness when she decreased her milk intake. Home readings mostly 160F systolic, office BP was 093/23. Losartan was increased to 12.5 mg daily from 12.5 mg QOD.   Patient presents today in good spirits. She reported dizziness and lightheadedness often when standing in the morning or when she has been standing in the kitchen for a while. She occasionally takes her blood pressure at that time and it has not been an alarming reading. Patient states she itches all night with her BP medications and is tired during the day. She stopped her BP meds for a day and felt better the next day. She stated not feeling as itchy when on spironolactone alone. She went to a dermatologist for itching about 5 years ago. Her dermatologist recommended avoiding soaps and using dye-free  laundry detergent which has helped. Her dermatologist attributed itching to BP medications.  She does not remember decreasing milk intake as reported during last visit but willing to try. She thinks she was on HCTZ alone a long time ago which was effective and she tolerated well. Patient brought in home BP measurements which ranged 153-170/54-60 about 1 hour after medications. Her BP in office today was 186/78, she had not yet taken her BP medications as she has to separate from levothyroxine. She had a cup of coffee about an hour before visit. Patient trying to avoid processed meats and increase exercise to 30 minutes per day.  Current HTN meds: losartan 12.5mg  daily, spironolactone 25mg  (both 10AM) * PRN Lasix 20mg      Previously tried: lisinopril (low energy), valsartan-HCTZ (low energy, itchiness documented 04/30/20), amlodipine 2.5mg  daily (fatique, dizziness, "skin issues") BP goal: <130/80  Family History:  - Maternal & paternal HTN - no FH of CVD  Social History:  - no alcohol - no smoking   Diet:  - Veggies, Beans --> frozen/fresh,  - Meat: chicken & Fish with occasional burger (1x/mo) -  Salads (home made) - She went to a health class that told her to avoid large amounts of dressing -  Low salt if she eats canned things - does not salt food  - does not eat out   Tries to eat a lot of fruit and stays away from processed meat  Drinks: - 1 cup  of coffee/day - soda - 1/2 per day  - Orange juice  Exercise:  - restarted exercising  - 15 minutes / day, plans to increase to 30 minutes per day - has exercise bike and treadmill   Home BP readings: 153-170/54-60   Wt Readings from Last 3 Encounters:  09/05/20 228 lb 11.2 oz (103.7 kg)  06/12/20 223 lb 12.8 oz (101.5 kg)  03/12/20 216 lb 12.8 oz (98.3 kg)   BP Readings from Last 3 Encounters:  09/05/20 (!) 178/78  08/13/20 (!) 158/76  06/12/20 (!) 166/68   Pulse Readings from Last 3 Encounters:  09/05/20 61  08/13/20  60  06/12/20 (!) 58    Renal function: Estimated Creatinine Clearance: 62.3 mL/min (by C-G formula based on SCr of 0.81 mg/dL).  Past Medical History:  Diagnosis Date  . Arthritis   . Asthma    Never smoker  . Blood transfusion    over 40 y ears  . Breast mass, left   . Cancer ([place])   . History of radiation therapy 09/07/19- 10/06/19   Left Breast 15 fractions of 2.67 Gy to total 40.05 Gy. Left breast boost 5 fractions of 2 Gy each to total 10 Gy  . Hypertension    on meds  . Hypothyroidism    on meds  . Pre-diabetes     Current Outpatient Medications on File Prior to Visit  Medication Sig Dispense Refill  . acetaminophen (TYLENOL) 500 MG tablet Take 1,000 mg by mouth every 6 (six) hours as needed for moderate pain.     Marland Kitchen albuterol (PROVENTIL HFA;VENTOLIN HFA) 108 (90 Base) MCG/ACT inhaler Inhale 1-2 puffs into the lungs every 6 (six) hours as needed for wheezing or shortness of breath.     Marland Kitchen amLODipine (NORVASC) 2.5 MG tablet Take 1 tablet (2.5 mg total) by mouth daily. (Patient not taking: Reported on 08/13/2020) 90 tablet 1  . APPLE CIDER VINEGAR PO Take 500 mg by mouth daily.    . Artificial Tear Solution (SOOTHE XP OP) Place 1 drop into both eyes 3 (three) times daily as needed (dry or irritated eyes).    . Biotin 5 MG TABS Take 5 mg by mouth daily.     . cetaphil (CETAPHIL) cream Apply 1 application topically 2 (two) times daily.     . diphenhydrAMINE HCl (ZZZQUIL) 50 MG/30ML LIQD Take 50 mg by mouth at bedtime as needed (sleep).     . fluticasone (FLONASE) 50 MCG/ACT nasal spray Place 2 sprays into both nostrils daily as needed for allergies or rhinitis.    . furosemide (LASIX) 20 MG tablet Take 20 mg by mouth daily as needed for fluid or edema.    Marland Kitchen levothyroxine (SYNTHROID, LEVOTHROID) 88 MCG tablet Take 88 mcg by mouth daily before breakfast.     . losartan (COZAAR) 25 MG tablet Take 0.5 tablets (12.5 mg total) by mouth daily. 45 tablet 3  . meclizine (ANTIVERT) 25 MG  tablet Take 12.5 mg by mouth 3 (three) times daily as needed for dizziness.    . Multiple Vitamins-Minerals (ICAPS AREDS 2 PO) Take 1 capsule by mouth 2 (two) times daily.     . potassium chloride SA (KLOR-CON) 20 MEQ tablet Take 20 mEq by mouth daily as needed (when taking furosemide).    Marland Kitchen spironolactone (ALDACTONE) 25 MG tablet Take 1 tablet (25 mg total) by mouth daily. 90 tablet 3   No current facility-administered medications on file prior to visit.    Allergies  Allergen Reactions  . Lisinopril Itching    No energy  . Penicillins Itching and Rash    Has patient had a PCN reaction causing immediate rash, facial/tongue/throat swelling, SOB or lightheadedness with hypotension: No Has patient had a PCN reaction causing severe rash involving mucus membranes or skin necrosis: No Has patient had a PCN reaction that required hospitalization: No Has patient had a PCN reaction occurring within the last 10 years: No If all of the above answers are "NO", then may proceed with Cephalosporin use.  . Sulfa Antibiotics Itching and Rash   Vitals Today:  BP: 186/78  HR: 47   Assessment/Plan:  1. Hypertension -  The patient's blood pressure is persistently elevated above goal (<130/80) per home BP & office BP readings. Patient has had problems tolerating her BP meds due to itchiness and fatigue. She is not sure of which specific BP medications cause this reaction. Considering BP severely elevated today without medication, patient unsure of allergy but bothered by losartan and spironolactone and patient reported tolerating HCTZ in the past, will discontinue losartan and spironolactone and initiate chlorthalidone 12.5 mg daily. Chlorthalidone is a more potent thiazide diuretic with greater BP lowering effect compared to HCTZ. Discussed seeing an allergist as patient may be allergic to inactive ingredients in medications. Encouraged patient to keep a log of when she feels itchy. Encouraged patient to  continue avoiding processed meat, eating fruit, and trying to increase exercise from 15 minutes per day to 30 minutes per day as toelrated.   Will see patient on 09/18/20 to evaluate efficacy and tolerability of chlorthalidone. Will repeat BMET at that time. Plan to increase chlorthalidone to 25 mg daily at that time or consider adding BP medication patient has not yet tried.   Thank you,  Benna Dunks  PharmD Candidate, Class of 2022  Ramond Dial, Pharm.D, BCPS, CPP [facility]  [address] [place], [place] [address]  Phone: [phone]; Fax: [phone]

## 2020-09-06 ENCOUNTER — Telehealth: Payer: Self-pay | Admitting: Adult Health

## 2020-09-06 ENCOUNTER — Ambulatory Visit (INDEPENDENT_AMBULATORY_CARE_PROVIDER_SITE_OTHER): Payer: Medicare HMO | Admitting: Pharmacist

## 2020-09-06 VITALS — BP 186/78 | HR 47

## 2020-09-06 DIAGNOSIS — I10 Essential (primary) hypertension: Secondary | ICD-10-CM | POA: Diagnosis not present

## 2020-09-06 MED ORDER — CHLORTHALIDONE 25 MG PO TABS: 12.5000 mg | ORAL_TABLET | Freq: Every day | ORAL | 11 refills | Status: DC

## 2020-09-06 NOTE — Telephone Encounter (Signed)
No 11/17 los. No changes made to pt's schedule.

## 2020-09-06 NOTE — Patient Instructions (Signed)
It was a pleasure seeing you today!   You will stop your losartan and spironolactone.   Start taking 12.5 mg (1/2 tablet) of chlorthalidone daily. This medication should be taken 2 hours after your thyroid medication   Keep a log of how you are feeling and when you notice itching.   Keep taking your blood pressure about an hour or two after taking your medications and 30 minutes or more after a cup of coffee. Bring your blood pressure monitor to your next visit.   Continue eating fruits and avoiding processed meats. Continue trying to build up to exercising 30 minutes per day.

## 2020-09-10 ENCOUNTER — Telehealth: Payer: Self-pay

## 2020-09-10 NOTE — Telephone Encounter (Signed)
Patient returned phone call and was given message from Wilber Bihari, NP that chest x ray shows no changes and patient should follow up with PCP about shortness of breath. Patient verbalized understanding.

## 2020-09-10 NOTE — Telephone Encounter (Signed)
-----   Message -----  From: Gardenia Phlegm, NP  Sent: 09/07/2020  1:34 PM EST  To: [facility]   Chest xray shows no changes. Would recommend she f/u with PCP about shortness of breath.  ----- Message -----  From: Interface, Rad Results In  Sent: 09/06/2020  3:57 PM EST  To: Gardenia Phlegm, NP

## 2020-09-10 NOTE — Telephone Encounter (Signed)
Called patient and left a voicemail for her to return call to [facility] for imaging results.

## 2020-09-12 ENCOUNTER — Encounter: Payer: Self-pay | Admitting: Licensed Clinical Social Worker

## 2020-09-12 NOTE — Progress Notes (Signed)
[facility] Quality of Life Screening Clinical Social Work  Clinical Social Work was referred by survivorship quality of life screening protocol.  The patient scored a 51.2 on the Quality of Life/ Cancer Survivor (QOL-CS) scale which indicates high quality of life.   Based on screener, main concerns are fatigue & appetite, discussed with NP. Patient was provided general resources for support in survivorship and may contact CSW with additional needs.    Follow up plan: 1. Follow-up QOL screen to be sent in 1 months.     Nadra Hritz E Hermenia Fritcher, LCSW

## 2020-09-17 NOTE — Progress Notes (Signed)
Patient ID: Alisha [name] Sorter                 DOB: April 18, 1935                      MRN: [id number]     HPI: Alisha [name] is a 85 y.o. female referred by [name] to HTN clinic. PMH is significant for chronic bradycardia, diastolic dysfunction, HTN, Breast cancer w/ radiation, hypothyroidism, pre-DM.   Patient was seem by [name] in August for complaints of fatigue and itching. Her blood pressure was elevated. She was started on losartan 12.5mg  qod at that visit. Losartan was increased to 12.5mg  daily at visit on 10/26.   At last visit with PharmD clinic on 09/06/20 patient stated she itched all night with her BP medications and is tired during the day. She stopped her BP meds for a day and felt better the next day. She stated not feeling as itchy when on spironolactone alone. Losartan and spironolactone were discontinued and chlorthalidone 12.5 mg daily was initiated.   Patient presents in good spirits today. Patient reports tolerating chlorthalidone well. Patient picked up chlorthalidone on Saturday and was taking losartan and spironolactone up until she was able to pick up her new prescription. Patient states she is still itching a bit but not nearly as much since medication change and since she started rinsing her clothes an extra time a few days ago. Her energy level has improved. Patient states her oncologist attributed itching to radiation. Patient reports having radiation treatment, medication changes, and COVID within a short time period so she is still unsure of cause of itching. Patient reports lightheadedness when standing quickly which is not new. Patient brought in home BP cuff and a few home readings today. BP this morning before AM medications was 149/67. Most readings are in 016W systolic with isolated reading in 109N systolic. While calibrating BP cuff, found that patient's cuff was a couple centimeters too small. Measurements fluctuated on home BP monitor and were  different from aneroid measurements.   Home BP cuff calibration:   177/75 on first reading with home monitor, 235 systolic on aneroid first reading 168/74 on second reading on home monitor, 573 systolic on aneroid second reading  175/79 on third home BP monitor reading, 160/80 on aneroid   Current HTN meds: chlorthalidone 12.5 mg daily (AM) * PRN Lasix 20mg      Previously tried: lisinopril (low energy, itching), valsartan-HCTZ (low energy, itchiness documented 04/30/20), amlodipine 2.5mg  daily (fatique, dizziness, "skin issues"), spironolactone 25 mg daily (itching), losartan 12.5 mg daily (itching) BP goal: <130/80  Family History:  - Maternal & paternal HTN - no FH of CVD  Social History:  - no alcohol - no smoking   Diet:  - Veggies, Beans --> frozen/fresh,  - Meat: chicken & Fish with occasional burger (1x/mo) -  Salads (home made) - She went to a health class that told her to avoid large amounts of dressing -  Low salt if she eats canned things - does not salt food  - does not eat out   Eats spinach and beets  Tries to eat a lot of fruit and stays away from processed meat Drinks: - 1 cup of coffee/day - soda - 1/2 per day, stopped drinking soda  - Orange juice sometimes when she has run out of potassium tablets - 6 8 oz glasses of water per day   Exercise:  - restarted exercising  -  15 minutes / day, plans to increase to 30 minutes per day - has exercise bike and treadmill   Home BP readings: 153-170/54-60  149/67 at 10AM, 154/73 at 10:30 AM  Runs about 546E systolic but has had a couple isolated readings in the 170s    Wt Readings from Last 3 Encounters:  09/05/20 228 lb 11.2 oz (103.7 kg)  06/12/20 223 lb 12.8 oz (101.5 kg)  03/12/20 216 lb 12.8 oz (98.3 kg)   BP Readings from Last 3 Encounters:  09/06/20 (!) 186/78  09/05/20 (!) 178/78  08/13/20 (!) 158/76   Pulse Readings from Last 3 Encounters:  09/06/20 (!) 47  09/05/20 61  08/13/20 60     Renal function: Estimated Creatinine Clearance: 62.3 mL/min (by C-G formula based on SCr of 0.81 mg/dL).  Past Medical History:  Diagnosis Date  . Arthritis   . Asthma    Never smoker  . Blood transfusion    over 40 y ears  . Breast mass, left   . Cancer ([place])   . History of radiation therapy 09/07/19- 10/06/19   Left Breast 15 fractions of 2.67 Gy to total 40.05 Gy. Left breast boost 5 fractions of 2 Gy each to total 10 Gy  . Hypertension    on meds  . Hypothyroidism    on meds  . Pre-diabetes     Current Outpatient Medications on File Prior to Visit  Medication Sig Dispense Refill  . acetaminophen (TYLENOL) 500 MG tablet Take 1,000 mg by mouth every 6 (six) hours as needed for moderate pain.     Marland Kitchen albuterol (PROVENTIL HFA;VENTOLIN HFA) 108 (90 Base) MCG/ACT inhaler Inhale 1-2 puffs into the lungs every 6 (six) hours as needed for wheezing or shortness of breath.     . Artificial Tear Solution (SOOTHE XP OP) Place 1 drop into both eyes 3 (three) times daily as needed (dry or irritated eyes).    . Biotin 5 MG TABS Take 5 mg by mouth daily.     . cetaphil (CETAPHIL) cream Apply 1 application topically 2 (two) times daily.     . chlorthalidone (HYGROTON) 25 MG tablet Take 0.5 tablets (12.5 mg total) by mouth daily. 30 tablet 11  . diphenhydrAMINE HCl (ZZZQUIL) 50 MG/30ML LIQD Take 50 mg by mouth at bedtime as needed (sleep).     . fluticasone (FLONASE) 50 MCG/ACT nasal spray Place 2 sprays into both nostrils daily as needed for allergies or rhinitis.    . furosemide (LASIX) 20 MG tablet Take 20 mg by mouth daily as needed for fluid or edema.    Marland Kitchen levothyroxine (SYNTHROID, LEVOTHROID) 88 MCG tablet Take 88 mcg by mouth daily before breakfast.     . meclizine (ANTIVERT) 25 MG tablet Take 12.5 mg by mouth 3 (three) times daily as needed for dizziness.    . Multiple Vitamins-Minerals (ICAPS AREDS 2 PO) Take 1 capsule by mouth 2 (two) times daily.     . potassium chloride SA  (KLOR-CON) 20 MEQ tablet Take 20 mEq by mouth daily as needed (when taking furosemide).     No current facility-administered medications on file prior to visit.    Allergies  Allergen Reactions  . Lisinopril Itching    No energy  . Penicillins Itching and Rash    Has patient had a PCN reaction causing immediate rash, facial/tongue/throat swelling, SOB or lightheadedness with hypotension: No Has patient had a PCN reaction causing severe rash involving mucus membranes or skin necrosis: No Has  patient had a PCN reaction that required hospitalization: No Has patient had a PCN reaction occurring within the last 10 years: No If all of the above answers are "NO", then may proceed with Cephalosporin use.  . Sulfa Antibiotics Itching and Rash   Vitals Today:  BP: 162/64 HR: 58   Assessment/Plan:  1. Hypertension -  The patient's blood pressure is persistently elevated above goal (<130/80) per home BP & office BP readings. Patient has had problems tolerating her BP meds due to itchiness and fatigue. She is not sure of which specific BP medications cause this reaction. She has tolerated change from losartan and spironolactone to chlorthalidone 12.5 mg daily well. Considering BP elevated today, patient has tolerated a short trial of chlorthalidone, and patient having trouble cutting tablet in half, will increase chlorthalidone 25 mg daily pending BMET today. Patient knows to wait for our call on lab results to confirm if chlorthalidone dose increase can be made. Reviewed BP technique and calibrated BP cuff in clinic. Encouraged patient to look for a larger BP cuff to ensure accuracy of home BP readings as BP cuff was off from clinic readings today. She needs a BP cuff that goes up to at least 44cm.   Will call patient tomorrow with BMP results and schedule 2 week follow up at that time.  Thank you,  Benna Dunks  PharmD Candidate, Class of 2022  Ramond Dial, Pharm.D, BCPS, CPP [facility]  [address] [place], [place] [address]  Phone: [phone]; Fax: [phone]

## 2020-09-18 ENCOUNTER — Other Ambulatory Visit: Payer: Self-pay

## 2020-09-18 ENCOUNTER — Ambulatory Visit (INDEPENDENT_AMBULATORY_CARE_PROVIDER_SITE_OTHER): Payer: Medicare HMO | Admitting: Pharmacist

## 2020-09-18 VITALS — BP 162/64 | HR 58

## 2020-09-18 DIAGNOSIS — I10 Essential (primary) hypertension: Secondary | ICD-10-CM

## 2020-09-18 NOTE — Patient Instructions (Addendum)
It was a pleasure seeing you today!   We will do blood work today to check your kidneys and electrolytes. We will give you a call tomorrow with the results. If the blood work comes back good, we would like for you to start taking chlorthalidone 25 mg daily (1 tablet). You can take another 1/2 tablet when you get home today and we will call you with further instructions when we get the results of your blood work.   Try to find a monitor with a 44 centimeter or larger cuff to get the best and most accurate readings. Make sure that the cuff is compatible with your monitor.Take your blood pressure 1-2 hours after taking your chlorthalidone and 30 minutes or more after a cup of coffee and try to keep record of the readings.  Continue drinking a lot of water to stay hydrated and rinsing clothes twice to avoid itching.

## 2020-09-19 ENCOUNTER — Telehealth: Payer: Self-pay | Admitting: Pharmacist

## 2020-09-19 LAB — BASIC METABOLIC PANEL
BUN/Creatinine Ratio: 21 (ref 12–28)
BUN: 18 mg/dL (ref 8–27)
CO2: 20 mmol/L (ref 20–29)
Calcium: 9.3 mg/dL (ref 8.7–10.3)
Chloride: 105 mmol/L (ref 96–106)
Creatinine, Ser: 0.86 mg/dL (ref 0.57–1.00)
GFR calc Af Amer: 72 mL/min/{1.73_m2} (ref >59)
GFR calc non Af Amer: 63 mL/min/{1.73_m2} (ref >59)
Glucose: 116 mg/dL — ABNORMAL HIGH (ref 65–99)
Potassium: 4.5 mmol/L (ref 3.5–5.2)
Sodium: 141 mmol/L (ref 134–144)

## 2020-09-19 MED ORDER — CHLORTHALIDONE 25 MG PO TABS: 25.0000 mg | ORAL_TABLET | Freq: Every day | ORAL | 11 refills | Status: DC

## 2020-09-19 NOTE — Telephone Encounter (Signed)
Patient called back returning Megan's call

## 2020-09-19 NOTE — Telephone Encounter (Signed)
Left message for pt.  BMET stable, will increase chlorthalidone from 12.5mg  to 25mg  daily as discussed at HTN visit yesterday. Also needs 2 week f/u appt scheduled with PharmD.

## 2020-09-19 NOTE — Telephone Encounter (Signed)
Called pt again and advised her of below message. New rx sent to pharmacy, f/u scheduled on 12/13.

## 2020-09-30 NOTE — Progress Notes (Signed)
Patient ID: Alisha [name]                 DOB: 1935-04-25                      MRN: [id number]     HPI: Alisha [name] is a 85 y.o. female referred by [name] to HTN clinic. PMH is significant for chronic bradycardia, diastolic dysfunction, HTN, Breast cancer w/ radiation, hypothyroidism, pre-DM.   She was last seen in HTN clinic for follow-up on 09/18/20. Her BP was improved after switching losartan and spironolactone to chlorthalidone at last visit but it was still elevated at 162/64, HR 58. She was tolerating chlorthalidone well with some itchiness but not nearly as much as with prior medications. She reported that her oncologist attributed itching to radiation. Home SBP mostly in 150s. Patient's home BP cuff found to be too small which resulted in fluctuating measurements. She was encouraged to buy a larger cuff and chlorthalidone increased to 25 mg daily.  Patient presents today for follow-up. She states that her itching is much improved. She had one serious episode of itching on Thursday night but this resolved after she cleansed her body and used Cetaphil lotion. She thinks her itching is due to stress and no longer thinks her medications is causing her itching. Her fatigue is also improving but she still has some bad days. She was unable to find a larger BP cuff but would like to try using a wrist cuff. She continues to work on improving her cardiac-healthy diet and increased her daily exercise. After discussing her medication options, she states that she would rather try a medication she has taken in the past than to start hydralazine TID.  Current HTN meds: chlorthalidone 25 mg daily (AM), PRN Lasix 20mg      Previously tried: lisinopril (low energy, itching), valsartan-HCTZ (low energy, itchiness), amlodipine 2.5mg  daily (fatique, dizziness, "skin issues"), spironolactone 25 mg daily (itching), losartan 12.5 mg daily (itching)  BP goal: <130/80  Family History: HTN in  mother and father  Social History: Denies alcohol or smoking  Diet: Continues to work on her diet - enjoys vegetables, beans, chicken, fish, occasional homemade burger (1x/mo), salad - does not add salt to food  Drinks: 1 cup of coffee/day, 6 8-oz glasses of water per day   Exercise: Increased exercise to 30 minutes per day - using weights, exercise bike, and treadmill  Home BP readings: 142/81, 145/69, 163/72; HR 57, 60, 61   Wt Readings from Last 3 Encounters:  09/05/20 228 lb 11.2 oz (103.7 kg)  06/12/20 223 lb 12.8 oz (101.5 kg)  03/12/20 216 lb 12.8 oz (98.3 kg)   BP Readings from Last 3 Encounters:  09/18/20 (!) 162/64  09/06/20 (!) 186/78  09/05/20 (!) 178/78   Pulse Readings from Last 3 Encounters:  09/18/20 (!) 58  09/06/20 (!) 47  09/05/20 61    Renal function: CrCl cannot be calculated (Unknown ideal weight.).  Past Medical History:  Diagnosis Date  . Arthritis   . Asthma    Never smoker  . Blood transfusion    over 40 y ears  . Breast mass, left   . Cancer ([place])   . History of radiation therapy 09/07/19- 10/06/19   Left Breast 15 fractions of 2.67 Gy to total 40.05 Gy. Left breast boost 5 fractions of 2 Gy each to total 10 Gy  . Hypertension    on meds  . Hypothyroidism  on meds  . Pre-diabetes     Current Outpatient Medications on File Prior to Visit  Medication Sig Dispense Refill  . acetaminophen (TYLENOL) 500 MG tablet Take 1,000 mg by mouth every 6 (six) hours as needed for moderate pain.     Marland Kitchen albuterol (PROVENTIL HFA;VENTOLIN HFA) 108 (90 Base) MCG/ACT inhaler Inhale 1-2 puffs into the lungs every 6 (six) hours as needed for wheezing or shortness of breath.     . Artificial Tear Solution (SOOTHE XP OP) Place 1 drop into both eyes 3 (three) times daily as needed (dry or irritated eyes).    . Biotin 5 MG TABS Take 5 mg by mouth daily.     . cetaphil (CETAPHIL) cream Apply 1 application topically 2 (two) times daily.     . chlorthalidone  (HYGROTON) 25 MG tablet Take 1 tablet (25 mg total) by mouth daily. 30 tablet 11  . diphenhydrAMINE HCl (ZZZQUIL) 50 MG/30ML LIQD Take 50 mg by mouth at bedtime as needed (sleep).     . fluticasone (FLONASE) 50 MCG/ACT nasal spray Place 2 sprays into both nostrils daily as needed for allergies or rhinitis.    . furosemide (LASIX) 20 MG tablet Take 20 mg by mouth daily as needed for fluid or edema.    Marland Kitchen levothyroxine (SYNTHROID, LEVOTHROID) 88 MCG tablet Take 88 mcg by mouth daily before breakfast.     . meclizine (ANTIVERT) 25 MG tablet Take 12.5 mg by mouth 3 (three) times daily as needed for dizziness.    . Multiple Vitamins-Minerals (ICAPS AREDS 2 PO) Take 1 capsule by mouth 2 (two) times daily.     . potassium chloride SA (KLOR-CON) 20 MEQ tablet Take 20 mEq by mouth daily as needed (when taking furosemide).     No current facility-administered medications on file prior to visit.    Allergies  Allergen Reactions  . Lisinopril Itching    No energy  . Penicillins Itching and Rash    Has patient had a PCN reaction causing immediate rash, facial/tongue/throat swelling, SOB or lightheadedness with hypotension: No Has patient had a PCN reaction causing severe rash involving mucus membranes or skin necrosis: No Has patient had a PCN reaction that required hospitalization: No Has patient had a PCN reaction occurring within the last 10 years: No If all of the above answers are "NO", then may proceed with Cephalosporin use.  . Sulfa Antibiotics Itching and Rash    Assessment/Plan:  1. Hypertension - Patient's blood pressure is very elevated and above goal of <130/80 mmHg. Will retrial spironolactone 12.5 mg daily pending BMET since patient prefers to try a past medication over a TID medication since she does not think the past itchiness was due to the medication. If itchiness returns, would consider starting hydralazine 12.5 mg TID. Continue chlorthalidone 25 mg daily. Encouraged patient to  continue working on cardiac healthy diet and to continue daily exercise. Also encouraged patient to buy wrist BP cuff and to bring cuff and readings to next appointment. Follow-up in 2 weeks on 10/15/20 for BP check.  Richardine Service, PharmD, BCPS PGY2 Cardiology Pharmacy Resident  Addendum: Since patient is willing to retry a medication she was on in the past, will discuss with patient after labs result a retrial of amlodipine vs spironolactone. From a renal/electrolyte standpoint amlodipine may be better.  Ramond Dial, Pharm.D, BCPS, CPP [facility]  [address], [place], [place] [address]  Phone: [phone]; Fax: [phone]

## 2020-10-01 ENCOUNTER — Other Ambulatory Visit: Payer: Self-pay

## 2020-10-01 ENCOUNTER — Ambulatory Visit (INDEPENDENT_AMBULATORY_CARE_PROVIDER_SITE_OTHER): Payer: Medicare HMO | Admitting: Pharmacist

## 2020-10-01 VITALS — BP 170/72 | HR 72

## 2020-10-01 DIAGNOSIS — I10 Essential (primary) hypertension: Secondary | ICD-10-CM

## 2020-10-01 MED ORDER — SPIRONOLACTONE 25 MG PO TABS: 12.5000 mg | ORAL_TABLET | Freq: Every day | ORAL | 3 refills | Status: DC

## 2020-10-01 NOTE — Addendum Note (Signed)
Addended byRichardine Service on: 10/01/2020 04:53 PM   Modules accepted: Orders

## 2020-10-01 NOTE — Patient Instructions (Addendum)
It was nice to see you today!   Your blood pressure goal is less than 130/25mmHg   Continue taking your chlorthalidone 25 mg daily.    If your labs are stable today, we will plan to start spironolactone 12.5 mg (HALF tablet) once daily. We will give you a call when the labs come back so that we can finalize this plan and schedule follow up   Please buy a wrist blood pressure cuff and continue to monitor your blood pressure at home. Bring your readings and your home cuff to your next appointment.

## 2020-10-02 ENCOUNTER — Telehealth (HOSPITAL_COMMUNITY): Payer: Self-pay | Admitting: Pharmacist

## 2020-10-02 LAB — BASIC METABOLIC PANEL
BUN/Creatinine Ratio: 24 (ref 12–28)
BUN: 18 mg/dL (ref 8–27)
CO2: 26 mmol/L (ref 20–29)
Calcium: 9.6 mg/dL (ref 8.7–10.3)
Chloride: 99 mmol/L (ref 96–106)
Creatinine, Ser: 0.76 mg/dL (ref 0.57–1.00)
GFR calc Af Amer: 84 mL/min/{1.73_m2} (ref >59)
GFR calc non Af Amer: 73 mL/min/{1.73_m2} (ref >59)
Glucose: 177 mg/dL — ABNORMAL HIGH (ref 65–99)
Potassium: 3.6 mmol/L (ref 3.5–5.2)
Sodium: 141 mmol/L (ref 134–144)

## 2020-10-02 MED ORDER — AMLODIPINE BESYLATE 5 MG PO TABS: 2.5000 mg | ORAL_TABLET | Freq: Every day | ORAL | 3 refills | Status: DC

## 2020-10-02 NOTE — Addendum Note (Signed)
Addended byRichardine Service on: 10/02/2020 09:39 AM   Modules accepted: Orders

## 2020-10-02 NOTE — Telephone Encounter (Signed)
Called patient and left message to call back. BMET from yesterday stable and within normal limits. Continue chlorthalidone 25 mg daily. At HTN clinic visit yesterday, discussed re-trial of spironolactone 12.5 mg daily pending BMET results. However, a re-trial of amlodipine 2.5 mg daily may be a better option from a renal/electrolyte monitoring standpoint. Will need to discuss this with patient and send in new prescriptions prior to next follow-up visit on 10/15/20.

## 2020-10-02 NOTE — Telephone Encounter (Signed)
Spoke with patient and discussed information below. Patient is willing to start amlodipine 2.5 mg daily and will continue chlorthalidone 25 mg daily. Will send in new prescription for amlodipine. Follow-up office visit scheduled for 10/15/20.

## 2020-10-15 ENCOUNTER — Ambulatory Visit (INDEPENDENT_AMBULATORY_CARE_PROVIDER_SITE_OTHER): Payer: Medicare HMO | Admitting: Pharmacist

## 2020-10-15 ENCOUNTER — Other Ambulatory Visit: Payer: Self-pay

## 2020-10-15 DIAGNOSIS — R42 Dizziness and giddiness: Secondary | ICD-10-CM | POA: Diagnosis not present

## 2020-10-15 DIAGNOSIS — E785 Hyperlipidemia, unspecified: Secondary | ICD-10-CM | POA: Diagnosis not present

## 2020-10-15 DIAGNOSIS — E039 Hypothyroidism, unspecified: Secondary | ICD-10-CM | POA: Diagnosis not present

## 2020-10-15 DIAGNOSIS — R739 Hyperglycemia, unspecified: Secondary | ICD-10-CM | POA: Diagnosis not present

## 2020-10-15 DIAGNOSIS — I10 Essential (primary) hypertension: Secondary | ICD-10-CM

## 2020-10-15 MED ORDER — CHLORTHALIDONE 25 MG PO TABS: 25.0000 mg | ORAL_TABLET | Freq: Every day | ORAL | 3 refills | Status: DC

## 2020-10-15 MED ORDER — AMLODIPINE BESYLATE 5 MG PO TABS: 5.0000 mg | ORAL_TABLET | Freq: Every day | ORAL | 3 refills | Status: DC

## 2020-10-15 NOTE — Progress Notes (Signed)
Patient ID: Alisha [name]                 DOB: Aug 29, 1935                      MRN: [id number]     HPI: Alisha [name] is a 85 y.o. female referred by [name] to HTN clinic. PMH is significant for chronic bradycardia, diastolic dysfunction, HTN, Breast cancer w/ radiation, hypothyroidism, pre-DM.   She was last seen in HTN clinic for follow-up on 10/01/20. Her blood pressure was elevated and she was started on amlodipine 2.5mg  daily.  She reported that her oncologist attributed itching to radiation. Patient also stated she thought stress could be the cause of her itching and not her medications. She has been unable to find a home blood pressure cuff large enough, therefore we recommended she try a wrist cuff. Her BMP was stable after increasing chlorthalidone the visit before.   Patient presents today for follow up. She reports that her itching is much better. She has only had 2 episodes of itching since last visit. She admits to some dizziness, lightheadedness (occassionally). Denies headaches or blurred vision. She had 1 episode of SOB after she had exercised and rushed to take her dog out. She laid down and then felt better. Has only used furosemide once since last visit for swelling. She reports that her blood pressure has been high. She did buy a wrist cuff. The larger blood pressure cuff was too expensive. She took her blood pressure medications late today due to another appointment. Only took about 30 min prior to this appointment.  Upon review of blood pressure technique, patient was putting cuff on incorrectly. Upon fixing technique wrist cuff read 155/74. Clinic manual upper arm cuff 160/78. Also checked manually on the wrist and found to be close.  Current HTN meds: chlorthalidone 25 mg daily (AM), amlodipine 2.5mg  daily, PRN Lasix 20mg     Previously tried: lisinopril (low energy, itching), valsartan-HCTZ (low energy, itchiness), amlodipine 2.5mg  daily (fatique, dizziness,  "skin issues"), spironolactone 25 mg daily (itching), losartan 12.5 mg daily (itching)  BP goal: <130/80  Family History: HTN in mother and father  Social History: Denies alcohol or smoking  Diet: Continues to work on her diet - enjoys vegetables, beans, chicken, fish, occasional homemade burger (1x/mo), salad - does not add salt to food  Drinks: 1 cup of coffee/day, 6 8-oz glasses of water per day   Exercise: Increased exercise to 30 minutes per day - using weights, exercise bike, and treadmill  Home BP readings: 138/56, 150/72, 142/49, 158/68   Wt Readings from Last 3 Encounters:  09/05/20 228 lb 11.2 oz (103.7 kg)  06/12/20 223 lb 12.8 oz (101.5 kg)  03/12/20 216 lb 12.8 oz (98.3 kg)   BP Readings from Last 3 Encounters:  10/01/20 (!) 170/72  09/18/20 (!) 162/64  09/06/20 (!) 186/78   Pulse Readings from Last 3 Encounters:  10/01/20 72  09/18/20 (!) 58  09/06/20 (!) 47    Renal function: CrCl cannot be calculated (Unknown ideal weight.).  Past Medical History:  Diagnosis Date  . Arthritis   . Asthma    Never smoker  . Blood transfusion    over 40 y ears  . Breast mass, left   . Cancer ([facility])   . History of radiation therapy 09/07/19- 10/06/19   Left Breast 15 fractions of 2.67 Gy to total 40.05 Gy. Left breast boost 5 fractions of 2  Gy each to total 10 Gy  . Hypertension    on meds  . Hypothyroidism    on meds  . Pre-diabetes     Current Outpatient Medications on File Prior to Visit  Medication Sig Dispense Refill  . acetaminophen (TYLENOL) 500 MG tablet Take 1,000 mg by mouth every 6 (six) hours as needed for moderate pain.     Marland Kitchen albuterol (PROVENTIL HFA;VENTOLIN HFA) 108 (90 Base) MCG/ACT inhaler Inhale 1-2 puffs into the lungs every 6 (six) hours as needed for wheezing or shortness of breath.     Marland Kitchen amLODipine (NORVASC) 5 MG tablet Take 0.5 tablets (2.5 mg total) by mouth daily. 45 tablet 3  . Artificial Tear Solution (SOOTHE XP OP) Place 1 drop into  both eyes 3 (three) times daily as needed (dry or irritated eyes).    . Biotin 5 MG TABS Take 5 mg by mouth daily.     . cetaphil (CETAPHIL) cream Apply 1 application topically 2 (two) times daily.     . chlorthalidone (HYGROTON) 25 MG tablet Take 1 tablet (25 mg total) by mouth daily. 30 tablet 11  . diphenhydrAMINE HCl (ZZZQUIL) 50 MG/30ML LIQD Take 50 mg by mouth at bedtime as needed (sleep).     . fluticasone (FLONASE) 50 MCG/ACT nasal spray Place 2 sprays into both nostrils daily as needed for allergies or rhinitis.    . furosemide (LASIX) 20 MG tablet Take 20 mg by mouth daily as needed for fluid or edema.    Marland Kitchen levothyroxine (SYNTHROID, LEVOTHROID) 88 MCG tablet Take 88 mcg by mouth daily before breakfast.     . meclizine (ANTIVERT) 25 MG tablet Take 12.5 mg by mouth 3 (three) times daily as needed for dizziness.    . Multiple Vitamins-Minerals (ICAPS AREDS 2 PO) Take 1 capsule by mouth 2 (two) times daily.     . potassium chloride SA (KLOR-CON) 20 MEQ tablet Take 20 mEq by mouth daily as needed (when taking furosemide).     No current facility-administered medications on file prior to visit.    Allergies  Allergen Reactions  . Lisinopril Itching    No energy  . Penicillins Itching and Rash    Has patient had a PCN reaction causing immediate rash, facial/tongue/throat swelling, SOB or lightheadedness with hypotension: No Has patient had a PCN reaction causing severe rash involving mucus membranes or skin necrosis: No Has patient had a PCN reaction that required hospitalization: No Has patient had a PCN reaction occurring within the last 10 years: No If all of the above answers are "NO", then may proceed with Cephalosporin use.  . Sulfa Antibiotics Itching and Rash    Assessment/Plan:  1. Hypertension - Blood pressure above goal of <130/80 in clinic today. She did take her blood pressure medications late and shortly before arriving. However readings from home and another Dr. Vertis Kelch  today are also above goal. Will increase amlodipine to 5 mg daily. Continue chlorthalidone 25mg  daily. Prescriptions sent to pharmacy. Follow up in clinic in 2 weeks.   Ramond Dial, Pharm.D, BCPS, CPP [facility]  [address] [place], [place] [address]  Phone: [phone]; Fax: [phone]

## 2020-10-15 NOTE — Patient Instructions (Addendum)
It was nice to see you today!  Please increase amlodipine to 5mg  (whole tablet) daily Continue chlorthalidone 25mg  daily  Continue checking blood pressure once a day. Keep palm and meter facing up. Rest arm on tablet at heart level.  Call me at [phone] with any questions

## 2020-10-16 ENCOUNTER — Other Ambulatory Visit: Payer: Self-pay | Admitting: Cardiology

## 2020-10-23 ENCOUNTER — Other Ambulatory Visit: Payer: Self-pay | Admitting: Family Medicine

## 2020-10-23 DIAGNOSIS — R911 Solitary pulmonary nodule: Secondary | ICD-10-CM

## 2020-10-29 ENCOUNTER — Ambulatory Visit (INDEPENDENT_AMBULATORY_CARE_PROVIDER_SITE_OTHER): Payer: Medicare HMO | Admitting: Pharmacist

## 2020-10-29 ENCOUNTER — Other Ambulatory Visit: Payer: Self-pay

## 2020-10-29 VITALS — BP 144/72

## 2020-10-29 DIAGNOSIS — I10 Essential (primary) hypertension: Secondary | ICD-10-CM

## 2020-10-29 MED ORDER — SPIRONOLACTONE 25 MG PO TABS: 12.5000 mg | ORAL_TABLET | Freq: Every day | ORAL | 3 refills | Status: DC

## 2020-10-29 NOTE — Patient Instructions (Addendum)
It was nice to see you!  Please start taking spironolactone 12.5mg  daily (1/2 tablet)  Continue taking chlorthalidone 25 mg daily (AM) and amlodipine 5mg  daily.  Check blood pressure once a day and record readings. Remember palm up.  Keep up the good work with your exercise  Call me at [phone] with any questions

## 2020-10-29 NOTE — Progress Notes (Signed)
Patient ID: Alisha [name]                 DOB: 1935-08-29                      MRN: [id number]     HPI: Alisha [name] is a 86 y.o. female referred by [name] to HTN clinic. PMH is significant for chronic bradycardia, diastolic dysfunction, HTN, Breast cancer w/ radiation, hypothyroidism, pre-DM.   She was last seen in HTN clinic for follow-up on 10/15/20. Her blood pressure was elevated and her amlodipine was increased to 5 mg daily.  She is using a wrist cuff because her upper arm cuff is too small and she cannot afford a larger cuff.   Patient presents today for follow up. She reports that her itching is much better, but she did have a rash appear. This has gotten better. We dicussed it and we think it is stress induced. She denies dizziness or lightheadedness. Denies headaches or blurred vision. Has only used furosemide once since last visit for swelling, which resolved.  Compared wrist cuff to clinic measurement again. This time wrist cuff was a little higher. Patient was still putting on upside down at home.  Home wrist cuff 159/77 clinic cuff 148/78 (upper arm, manual)  She is up to 30 min a day of exercise. She does 10 min on bike, treadmill and health rider each.  Current HTN meds: chlorthalidone 25 mg daily (AM), amlodipine 5mg  daily, PRN Lasix 20mg     Previously tried: lisinopril (low energy, itching), valsartan-HCTZ (low energy, itchiness), amlodipine 2.5mg  daily (fatique, dizziness, "skin issues"), spironolactone 25 mg daily (itching), losartan 12.5 mg daily (itching)  BP goal: <130/80  Family History: HTN in mother and father  Social History: Denies alcohol or smoking  Diet: Continues to work on her diet - enjoys vegetables, beans, chicken, fish, occasional homemade burger (1x/mo), salad - does not add salt to food  Drinks: 1 cup of coffee/day, 6 8-oz glasses of water per day   Exercise: Increased exercise to 30 minutes per day - using weights, exercise bike,  and treadmill  Home BP readings: 141/78, 156/72, 149/61, 138/61, 187/68 (before meds) HR 70, 55, 55, 57,66   Wt Readings from Last 3 Encounters:  09/05/20 228 lb 11.2 oz (103.7 kg)  06/12/20 223 lb 12.8 oz (101.5 kg)  03/12/20 216 lb 12.8 oz (98.3 kg)   BP Readings from Last 3 Encounters:  10/01/20 (!) 170/72  09/18/20 (!) 162/64  09/06/20 (!) 186/78   Pulse Readings from Last 3 Encounters:  10/01/20 72  09/18/20 (!) 58  09/06/20 (!) 47    Renal function: CrCl cannot be calculated (Patient's most recent lab result is older than the maximum 21 days allowed.).  Past Medical History:  Diagnosis Date  . Arthritis   . Asthma    Never smoker  . Blood transfusion    over 40 y ears  . Breast mass, left   . Cancer ([facility])   . History of radiation therapy 09/07/19- 10/06/19   Left Breast 15 fractions of 2.67 Gy to total 40.05 Gy. Left breast boost 5 fractions of 2 Gy each to total 10 Gy  . Hypertension    on meds  . Hypothyroidism    on meds  . Pre-diabetes     Current Outpatient Medications on File Prior to Visit  Medication Sig Dispense Refill  . acetaminophen (TYLENOL) 500 MG tablet Take 1,000 mg by mouth  every 6 (six) hours as needed for moderate pain.     Marland Kitchen albuterol (PROVENTIL HFA;VENTOLIN HFA) 108 (90 Base) MCG/ACT inhaler Inhale 1-2 puffs into the lungs every 6 (six) hours as needed for wheezing or shortness of breath.     Marland Kitchen amLODipine (NORVASC) 5 MG tablet Take 1 tablet (5 mg total) by mouth daily. 90 tablet 3  . Artificial Tear Solution (SOOTHE XP OP) Place 1 drop into both eyes 3 (three) times daily as needed (dry or irritated eyes).    . Biotin 5 MG TABS Take 5 mg by mouth daily.     . cetaphil (CETAPHIL) cream Apply 1 application topically 2 (two) times daily.     . chlorthalidone (HYGROTON) 25 MG tablet Take 1 tablet (25 mg total) by mouth daily. 90 tablet 3  . diphenhydrAMINE HCl (ZZZQUIL) 50 MG/30ML LIQD Take 50 mg by mouth at bedtime as needed (sleep).     .  fluticasone (FLONASE) 50 MCG/ACT nasal spray Place 2 sprays into both nostrils daily as needed for allergies or rhinitis.    . furosemide (LASIX) 20 MG tablet Take 20 mg by mouth daily as needed for fluid or edema.    Marland Kitchen levothyroxine (SYNTHROID, LEVOTHROID) 88 MCG tablet Take 88 mcg by mouth daily before breakfast.     . meclizine (ANTIVERT) 25 MG tablet Take 12.5 mg by mouth 3 (three) times daily as needed for dizziness.    . Multiple Vitamins-Minerals (ICAPS AREDS 2 PO) Take 1 capsule by mouth 2 (two) times daily.     . potassium chloride SA (KLOR-CON) 20 MEQ tablet Take 20 mEq by mouth daily as needed (when taking furosemide).     No current facility-administered medications on file prior to visit.    Allergies  Allergen Reactions  . Lisinopril Itching    No energy  . Penicillins Itching and Rash    Has patient had a PCN reaction causing immediate rash, facial/tongue/throat swelling, SOB or lightheadedness with hypotension: No Has patient had a PCN reaction causing severe rash involving mucus membranes or skin necrosis: No Has patient had a PCN reaction that required hospitalization: No Has patient had a PCN reaction occurring within the last 10 years: No If all of the above answers are "NO", then may proceed with Cephalosporin use.  . Sulfa Antibiotics Itching and Rash    Assessment/Plan:  1. Hypertension - Blood pressure above goal of <130/80 in clinic today. We discussed benefits of adding a medication vs increasing dose of amlodipine. Will start spironolactone 12.5mg  daily. Continue amlodipine 5mg  daily and chlorthalidone 25mg  daily. Follow up at apt with [name] in 2 weeks. She will get a BMP then. Will call her after her apt with [name] to schedule follow up in HTN clinic.  Ramond Dial, Pharm.D, BCPS, CPP [facility]  [address] [place], [place] [address]  Phone: [phone]; Fax: [phone]

## 2020-11-12 ENCOUNTER — Ambulatory Visit: Payer: Medicare HMO | Admitting: Cardiology

## 2020-11-12 ENCOUNTER — Other Ambulatory Visit: Payer: Self-pay

## 2020-11-12 ENCOUNTER — Encounter: Payer: Self-pay | Admitting: Cardiology

## 2020-11-12 VITALS — BP 150/80 | HR 52 | Ht 64.0 in | Wt 222.4 lb

## 2020-11-12 DIAGNOSIS — I5032 Chronic diastolic (congestive) heart failure: Secondary | ICD-10-CM | POA: Diagnosis not present

## 2020-11-12 DIAGNOSIS — I10 Essential (primary) hypertension: Secondary | ICD-10-CM | POA: Diagnosis not present

## 2020-11-12 DIAGNOSIS — R001 Bradycardia, unspecified: Secondary | ICD-10-CM | POA: Diagnosis not present

## 2020-11-12 DIAGNOSIS — R0609 Other forms of dyspnea: Secondary | ICD-10-CM

## 2020-11-12 DIAGNOSIS — R0600 Dyspnea, unspecified: Secondary | ICD-10-CM | POA: Diagnosis not present

## 2020-11-12 LAB — BASIC METABOLIC PANEL
BUN/Creatinine Ratio: 23 (ref 12–28)
BUN: 17 mg/dL (ref 8–27)
CO2: 23 mmol/L (ref 20–29)
Calcium: 9.9 mg/dL (ref 8.7–10.3)
Chloride: 100 mmol/L (ref 96–106)
Creatinine, Ser: 0.74 mg/dL (ref 0.57–1.00)
GFR calc Af Amer: 86 mL/min/{1.73_m2} (ref >59)
GFR calc non Af Amer: 75 mL/min/{1.73_m2} (ref >59)
Glucose: 215 mg/dL — ABNORMAL HIGH (ref 65–99)
Potassium: 4 mmol/L (ref 3.5–5.2)
Sodium: 138 mmol/L (ref 134–144)

## 2020-11-12 MED ORDER — SPIRONOLACTONE 25 MG PO TABS: 25.0000 mg | ORAL_TABLET | Freq: Every day | ORAL | 3 refills | Status: DC

## 2020-11-12 MED ORDER — VALSARTAN 40 MG PO TABS: 20.0000 mg | ORAL_TABLET | Freq: Every day | ORAL | 3 refills | Status: DC

## 2020-11-12 NOTE — Progress Notes (Signed)
Primary Care Provider: [name], MD Cardiologist: No primary care provider on file. Electrophysiologist: None  Clinic Note: Chief Complaint  Patient presents with  . Hypertension    MD follow-up-has been seen by Pharm.D. team in [facility]  . Follow-up    Feels better.  Energy level improved.  Exercising more.  Seems to be tolerating spironolactone.   ===================================  ASSESSMENT/PLAN   Problem List Items Addressed This Visit    Diastolic dysfunction with heart failure (HCC) (Chronic)    Exertional dyspnea is probably a lot related to deconditioning, there is definitely some diastolic dysfunction involved.  Echo shows moderate LVH GRII DD.  She still has elevated blood pressure.--Not on any AV nodal agents because of fatigue and bradycardia.  Previously did not tolerate amlodipine because of fatigue, but now she is on 5 mg. She did not tolerate chlorthalidone-although it is listed, she is not taking, she did tolerate the 12.5 mg spironolactone, but has not tolerated ARB-HCTZ component.  Main symptom was itching.  Plan: Check baseline BMP today (reviewed above)  Continue amlodipine and titrate spironolactone up to 25 mg daily.  If stable after 1 week, will restart valsartan 20 mg (one half of 40 mg tablet) -> continue this regimen until seen by [facility] Pharm.D.  If doing well, would anticipate titrating valsartan up to full 40 mg daily.  Continue daily furosemide.      Relevant Medications   spironolactone (ALDACTONE) 25 MG tablet   valsartan (DIOVAN) 40 MG tablet   Other Relevant Orders   EKG 12-Lead (Completed)   Bradycardia with 41-50 beats per minute (Chronic)    Stable heart rate 52 bpm.  Would not tolerate AV nodal agent. No significant signs of fatigue or chronotropic incompetence.  Will follow as she is now exercising more.  If necessary to use a medication like hydralazine, the reflex tachycardia may be beneficial.      Essential hypertension -  Primary (Chronic)    Complicated medication issues in the past.  Through [facility], she has been able to tolerate 5 mg of amlodipine. Chlorthalidone not tolerated, but she did tolerate restarting spironolactone 12.5 mg.  Plan: Check baseline labs today, increase prolactin to 20 mg daily.  If tolerated 1 week, will restart valsartan at 1/2 of 40 mg tablet daily (20 mg). Follow-up with [facility] as scheduled.      Relevant Medications   spironolactone (ALDACTONE) 25 MG tablet   valsartan (DIOVAN) 40 MG tablet   DOE (dyspnea on exertion)    Echo did show GRII DD with moderate LVH.  There is also a major component of obesity and deconditioning.  She does have exertional dyspnea.  We will gradually titrate up antihypertensives. Remains on stable low dose of diuretic.  With no true CHF symptoms.  Continue exercise.        ===================================  HPI:    Alisha [name] is a 86 y.o. female with history of Hypertension/Diastolic Dysfunction with Mild Heart Failure (along with history of breast cancer-radiation) symptoms below who presents today for 68-month follow-up.  Alisha [name] was last seen on June 12, 2020-> this was a follow-up for echocardiogram showing grade 2 diastolic dysfunction with elevated filling pressures and mild aortic sclerosis.  She said her dizziness and dyspnea notably improved.  She still has some edema as well as peripheral neuropathy.  She was going to try walking her treadmill as well as doing her stationary bicycle.  Energy levels were notably improved.  Still a bit of  orthopnea but no PND.  No chest pain.  Continue spironolactone 25 mg daily.  Change Lasix to PRN.  Started 12.5 mg losartan with plans to titrate to full 50. ->  She was to follow-up with [facility] for medication titration.  Recent Hospitalizations: None*   Most recently seen on October 29, 2020 by Marcelle Overlie, RPH-CCP -> she developed itching after having her amlodipine dose  titrated to 5 mg.  She also had a rash.  Alisha [name] evaluated her wrist cuff versus clinic cuff and noted her pressures were about 10 mmHg higher at home. Current HTN meds: chlorthalidone 25 mg daily (AM), amlodipine 5mg  daily, PRN Lasix 20mg                Previously tried: lisinopril (low energy, itching), valsartan-HCTZ (low energy, itchiness), amlodipine 2.5mg  daily (fatique, dizziness, "skin issues"), spironolactone 25 mg daily (itching), losartan 12.5 mg daily (itching) --> Started spironolactone 12.5 mg daily.   Reviewed  CV studies:    The following studies were reviewed today: (if available, images/films reviewed: From Epic Chart or Care Everywhere) . No new studies:   Interval History:   [name] returns here today actually in pretty good state of mind.  She is trying to do more exercise and try to alleviate her stress.  She is now exercising at least 30 minutes a day.  Her stress is improved and her blood pressure is also improved.  She is less short of breath with exertion and has better energy level.  Part of this relates of stress has led a decrease in her itchiness that she feels with her medications.  She was able to restart her spironolactone and tolerated okay.  But she did not tolerate the chlorthalidone.  Her swelling is pretty well controlled.  A lot better now.  She takes Lasix daily.  No PND orthopnea.  CV Review of Symptoms (Summary) positive for - Notably improved exercise tolerance with less dyspnea on exertion.  Stable pedal edema with current dose of Lasix. negative for - chest pain, irregular heartbeat, orthopnea, palpitations, paroxysmal nocturnal dyspnea, rapid heart rate, shortness of breath or Lightheadedness or dizziness, syncope/near syncope or TIA/amaurosis fugax, claudication  She does think that a lot of her fatigue and exertional dyspnea may be related to her treatment for breast cancer.  She was quite stressed out about healthcare bills etc.   Currently has stage Ib left breast cancer  The patient does not have symptoms concerning for COVID-19 infection (fever, chills, cough, or new shortness of breath).   REVIEWED OF SYSTEMS   Review of Systems  Constitutional: Positive for malaise/fatigue (Improving.  The more exercise she does.) and weight loss (She had put on weight and now she is lost the back.).  HENT: Negative for congestion and sinus pain.   Respiratory: Positive for shortness of breath (Notably improved.).   Cardiovascular: Positive for leg swelling (Stable). Negative for palpitations.  Gastrointestinal: Negative for blood in stool and melena.  Genitourinary: Negative for hematuria.  Musculoskeletal: Negative for joint pain.  Skin: Positive for itching (Definitely improved.  Was not able to take chlorthalidone.).  Neurological: Negative for dizziness and focal weakness.  Psychiatric/Behavioral: Negative for depression (This actually seems to be pretty well controlled at this point.) and memory loss. The patient is not nervous/anxious and does not have insomnia.    I have reviewed and (if needed) personally updated the patient's problem list, medications, allergies, past medical and surgical history, social and family history.   PAST  MEDICAL HISTORY   Past Medical History:  Diagnosis Date  . Arthritis   . Asthma    Never smoker  . Blood transfusion    over 40 y ears  . Breast mass, left   . Cancer ([place])   . History of radiation therapy 09/07/19- 10/06/19   Left Breast 15 fractions of 2.67 Gy to total 40.05 Gy. Left breast boost 5 fractions of 2 Gy each to total 10 Gy  . Hypertension    on meds  . Hypothyroidism    on meds  . Pre-diabetes     PAST SURGICAL HISTORY   Past Surgical History:  Procedure Laterality Date  . ABDOMINAL HYSTERECTOMY     32 yrs  . BREAST LUMPECTOMY WITH RADIOACTIVE SEED AND SENTINEL LYMPH NODE BIOPSY Left 07/27/2019   Procedure: LEFT BREAST LUMPECTOMY WITH RADIOACTIVE SEED AND  LEFT SENTINEL LYMPH NODE MAPPING;  Surgeon: Erroll Luna, MD;  Location: [facility];  Service: General;  Laterality: Left;  . BREAST LUMPECTOMY WITH RADIOACTIVE SEED LOCALIZATION Left 08/11/2016   Procedure: LEFT BREAST LUMPECTOMY WITH RADIOACTIVE SEED LOCALIZATION;  Surgeon: Erroll Luna, MD;  Location: [facility];  Service: General;  Laterality: Left;  . CATARACT EXTRACTION, BILATERAL    . CORONARY CALCIUM SCORE-CT ANGIOGRAM  04/2019   Small, < 64mm nodule noted.  Difficult study to read.  Coronary calcium score was 11.  Significant motion artifact, but visible territory showed minimal disease.  Minimal calcium noted in the ostial LAD but no suggestion of significant CAD.  Marland Kitchen EYE SURGERY    . JOINT REPLACEMENT Bilateral    left knee 2012  . KNEE ARTHROPLASTY  02/17/2012   Procedure: COMPUTER ASSISTED TOTAL KNEE ARTHROPLASTY;  Surgeon: Meredith Pel, MD;  Location: [facility];  Service: Orthopedics;  Laterality: Right;  Right total knee replacement  . TRANSTHORACIC ECHOCARDIOGRAM  04/02/2020   Normal LV function.  No R WMA.  Moderate LVH.  GRII DD with elevated filling pressures.  Moderate LA dilation with trivial MR.  Mild aortic valve sclerosis.    There is no immunization history on file for this patient.  MEDICATIONS/ALLERGIES   Current Meds  Medication Sig  . acetaminophen (TYLENOL) 500 MG tablet Take 1,000 mg by mouth every 6 (six) hours as needed for moderate pain.   Marland Kitchen albuterol (PROVENTIL HFA;VENTOLIN HFA) 108 (90 Base) MCG/ACT inhaler Inhale 1-2 puffs into the lungs every 6 (six) hours as needed for wheezing or shortness of breath.   Marland Kitchen amLODipine (NORVASC) 5 MG tablet Take 1 tablet (5 mg total) by mouth daily.  . Artificial Tear Solution (SOOTHE XP OP) Place 1 drop into both eyes 3 (three) times daily as needed (dry or irritated eyes).  . Biotin 5 MG TABS Take 5 mg by mouth daily.   . cetaphil (CETAPHIL) cream Apply 1 application topically 2 (two) times daily.   .  chlorthalidone (HYGROTON) 25 MG tablet Take 1 tablet (25 mg total) by mouth daily.  . diphenhydrAMINE HCl 50 MG/30ML LIQD Take 50 mg by mouth at bedtime as needed (sleep).   . fluticasone (FLONASE) 50 MCG/ACT nasal spray Place 2 sprays into both nostrils daily as needed for allergies or rhinitis.  . furosemide (LASIX) 20 MG tablet Take 20 mg by mouth daily as needed for fluid or edema.  Marland Kitchen levothyroxine (SYNTHROID, LEVOTHROID) 88 MCG tablet Take 88 mcg by mouth daily before breakfast.   . meclizine (ANTIVERT) 25 MG tablet Take 12.5 mg by mouth 3 (three) times daily  as needed for dizziness.  . Multiple Vitamins-Minerals (ICAPS AREDS 2 PO) Take 1 capsule by mouth 2 (two) times daily.   Marland Kitchen OVER THE COUNTER MEDICATION 300 mg in the morning and at bedtime. ADRENAL SUPPORT WITH ASHWAGANDHA  . OVER THE COUNTER MEDICATION Use as directed 304 mg in the mouth or throat in the morning and at bedtime. CHYAVANPRISH HERBAL BLEND  . potassium chloride SA (KLOR-CON) 20 MEQ tablet Take 20 mEq by mouth daily as needed (when taking furosemide).  . valsartan (DIOVAN) 40 MG tablet Take 0.5 tablets (20 mg total) by mouth daily.  . [DISCONTINUED] spironolactone (ALDACTONE) 25 MG tablet Take 0.5 tablets (12.5 mg total) by mouth daily.    Allergies  Allergen Reactions  . Lisinopril Itching    No energy  . Penicillins Itching and Rash    Has patient had a PCN reaction causing immediate rash, facial/tongue/throat swelling, SOB or lightheadedness with hypotension: No Has patient had a PCN reaction causing severe rash involving mucus membranes or skin necrosis: No Has patient had a PCN reaction that required hospitalization: No Has patient had a PCN reaction occurring within the last 10 years: No If all of the above answers are "NO", then may proceed with Cephalosporin use.  . Sulfa Antibiotics Itching and Rash    SOCIAL HISTORY/FAMILY HISTORY   Reviewed in Epic:  Pertinent findings:  Social History   Tobacco Use   . Smoking status: Never Smoker  . Smokeless tobacco: Never Used  Vaping Use  . Vaping Use: Never used  Substance Use Topics  . Alcohol use: No  . Drug use: No   Social History   Social History Narrative   No longer exercising - since "got tired" - previously did elliptical, exercise bike etc.    While working - used worked gym      Diet: Continues to work on her diet - enjoys vegetables, beans, chicken, fish, occasional homemade burger (1x/mo), salad - does not add salt to food      Drinks: 1 cup of coffee/day, 6 8-oz glasses of water per day       Exercise: Increased exercise to 30 minutes per day - using weights, exercise bike, and treadmill    OBJCTIVE -PE, EKG, labs   Wt Readings from Last 3 Encounters:  11/12/20 222 lb 6.4 oz (100.9 kg)  09/05/20 228 lb 11.2 oz (103.7 kg)  06/12/20 223 lb 12.8 oz (101.5 kg)    Physical Exam: BP (!) 150/80   Pulse (!) 52   Ht 5\' 4"  (1.626 m)   Wt 222 lb 6.4 oz (100.9 kg)   SpO2 93%   BMI 38.17 kg/m  Physical Exam Vitals reviewed.  Constitutional:      General: She is not in acute distress.    Appearance: She is not toxic-appearing.     Comments: Well-groomed, obese.  Otherwise healthy-appearing.  In good spirits.  HENT:     Head: Normocephalic and atraumatic.  Neck:     Vascular: No carotid bruit, hepatojugular reflux or JVD.  Cardiovascular:     Rate and Rhythm: Regular rhythm. Bradycardia present.  No extrasystoles are present.    Chest Wall: PMI is not displaced.     Pulses: Decreased pulses.     Heart sounds: S2 normal. Heart sounds are distant. Murmur heard.   Harsh crescendo-decrescendo early systolic murmur is present with a grade of 1/6 at the upper right sternal border radiating to the neck. No friction rub. No S4 sounds.  Pulmonary:     Effort: Pulmonary effort is normal. No respiratory distress.     Breath sounds: Wheezing (Diffuse and expiratory wheeze ) present. No rhonchi or rales.     Comments: Diffuse  interstitial sounds are clear with cough, but wheeze did not clear. Chest:     Chest wall: No tenderness.  Musculoskeletal:        General: Swelling (Trivial bilateral LE) present. Normal range of motion.     Cervical back: Normal range of motion and neck supple.  Neurological:     General: No focal deficit present.     Mental Status: She is alert and oriented to person, place, and time.     Cranial Nerves: No cranial nerve deficit.  Psychiatric:        Mood and Affect: Mood normal.        Behavior: Behavior normal.        Thought Content: Thought content normal.        Judgment: Judgment normal.     Comments: In remarkably good spirits.     Adult ECG Report  Rate: 52 ;  Rhythm: sinus bradycardia, sinus arrhythmia and Follow-up normal axis, intervals and durations.;   Narrative Interpretation: Stable EKG.  Recent Labs:    04/08/2020: TC 132, TG 75, HDL 36, LDL 101.    10/15/2020 A1c 6.9,   10/01/2020 ; Cr 0.76, K+ 3.6.  TSH 1.38 No results found for: CHOL, HDL, LDLCALC, LDLDIRECT, TRIG, CHOLHDL Lab Results  Component Value Date   CREATININE 0.74 11/12/2020   BUN 17 11/12/2020   NA 138 11/12/2020   K 4.0 11/12/2020   CL 100 11/12/2020   CO2 23 11/12/2020   CBC Latest Ref Rng & Units 09/05/2020 02/13/2020 07/21/2019  WBC 4.0 - 10.5 K/uL 10.1 8.8 11.1(H)  Hemoglobin 12.0 - 15.0 g/dL 11.7(L) 12.3 12.7  Hematocrit 36.0 - 46.0 % 36.8 39.1 39.6  Platelets 150 - 400 K/uL 250 251 251    No results found for: TSH  ==================================================  COVID-19 Education: The signs and symptoms of COVID-19 were discussed with the patient and how to seek care for testing (follow up with PCP or arrange E-visit).   The importance of social distancing and COVID-19 vaccination was discussed today. The patient is practicing social distancing & Masking.   I spent a total of 68minutes with the patient spent in direct patient consultation.  Additional time spent with  chart review  / charting (studies, outside notes, etc): 10 min Total Time: 34 min   Current medicines are reviewed at length with the patient today.  (+/- concerns) -=> she said that she was able to start back her spironolactone and has done okay with it.  Itching has been better but she did not tolerate the chlorthalidone.--She is tends to think that some of the itching was related to stress.  She has been working on her stress and the symptoms seem to have gotten better.  This visit occurred during the SARS-CoV-2 public health emergency.  Safety protocols were in place, including screening questions prior to the visit, additional usage of staff PPE, and extensive cleaning of exam room while observing appropriate contact time as indicated for disinfecting solutions.  Notice: This dictation was prepared with Dragon dictation along with smaller phrase technology. Any transcriptional errors that result from this process are unintentional and may not be corrected upon review.  Patient Instructions / Medication Changes & Studies & Tests Ordered   Patient Instructions  Medication Instructions:  Increase Spirolactone to 25 mg  Daily   after one week if no symptoms  Start taking Valsartan to 20 mg ( 1/2 tablet of 40 mg ) daliy   *If you need a refill on your cardiac medications before your next appointment, please call your pharmacy*   Lab Work: [employer] - today - already schedule  If you have labs (blood work) drawn today and your tests are completely normal, you will receive your results only by: Marland Kitchen MyChart Message (if you have MyChart) OR . A paper copy in the mail If you have any lab test that is abnormal or we need to change your treatment, we will call you to review the results.   Testing/Procedures: Not needed   Follow-Up: At [facility], you and your health needs are our priority.  As part of our continuing mission to provide you with exceptional heart care, we have created designated  Provider Care Teams.  These Care Teams include your primary Cardiologist (physician) and Advanced Practice Providers (APPs -  Physician Assistants and Nurse Practitioners) who all work together to provide you with the care you need, when you need it.  We recommend signing up for the patient portal called "MyChart".  Sign up information is provided on this After Visit Summary.  MyChart is used to connect with patients for Virtual Visits (Telemedicine).  Patients are able to view lab/test results, encounter notes, upcoming appointments, etc.  Non-urgent messages can be sent to your provider as well.   To learn more about what you can do with MyChart, go to [email or web address].    Your next appointment:   6 month(s)  The format for your next appointment:   In Person  Provider:   Glenetta Hew, MD   Other Instructions Keep your appointment with [facility] at [place] ( pharmacist)    Studies Ordered:   Orders Placed This Encounter  Procedures  . EKG 12-Lead     Glenetta Hew, M.D., M.S. Interventional Cardiologist   Pager # [phone] Phone # [phone] [address] [place], [place] [address]   Thank you for choosing Heartcare at [facility]!!

## 2020-11-12 NOTE — Patient Instructions (Addendum)
Medication Instructions:   Increase Spirolactone to 25 mg  Daily   after one week if no symptoms  Start taking Valsartan to 20 mg ( 1/2 tablet of 40 mg ) daliy   *If you need a refill on your cardiac medications before your next appointment, please call your pharmacy*   Lab Work: [employer] - today - already schedule  If you have labs (blood work) drawn today and your tests are completely normal, you will receive your results only by: Marland Kitchen MyChart Message (if you have MyChart) OR . A paper copy in the mail If you have any lab test that is abnormal or we need to change your treatment, we will call you to review the results.   Testing/Procedures: Not needed   Follow-Up: At [facility], you and your health needs are our priority.  As part of our continuing mission to provide you with exceptional heart care, we have created designated Provider Care Teams.  These Care Teams include your primary Cardiologist (physician) and Advanced Practice Providers (APPs -  Physician Assistants and Nurse Practitioners) who all work together to provide you with the care you need, when you need it.  We recommend signing up for the patient portal called "MyChart".  Sign up information is provided on this After Visit Summary.  MyChart is used to connect with patients for Virtual Visits (Telemedicine).  Patients are able to view lab/test results, encounter notes, upcoming appointments, etc.  Non-urgent messages can be sent to your provider as well.   To learn more about what you can do with MyChart, go to [email or web address].    Your next appointment:   6 month(s)  The format for your next appointment:   In Person  Provider:   Glenetta Hew, MD   Other Instructions Keep your appointment with [facility] at [place] ( pharmacist)

## 2020-11-13 DIAGNOSIS — E039 Hypothyroidism, unspecified: Secondary | ICD-10-CM | POA: Diagnosis not present

## 2020-11-14 ENCOUNTER — Encounter: Payer: Self-pay | Admitting: Cardiology

## 2020-11-14 ENCOUNTER — Ambulatory Visit
Admission: RE | Admit: 2020-11-14 | Discharge: 2020-11-14 | Disposition: A | Discharge status: Home / Self Care | Payer: Medicare HMO | Source: Ambulatory Visit | Attending: Family Medicine | Admitting: Family Medicine

## 2020-11-14 DIAGNOSIS — Z853 Personal history of malignant neoplasm of breast: Secondary | ICD-10-CM | POA: Diagnosis not present

## 2020-11-14 DIAGNOSIS — R911 Solitary pulmonary nodule: Secondary | ICD-10-CM

## 2020-11-14 DIAGNOSIS — M19041 Primary osteoarthritis, right hand: Secondary | ICD-10-CM | POA: Diagnosis not present

## 2020-11-14 DIAGNOSIS — I700 Atherosclerosis of aorta: Secondary | ICD-10-CM | POA: Diagnosis not present

## 2020-11-14 DIAGNOSIS — M19011 Primary osteoarthritis, right shoulder: Secondary | ICD-10-CM | POA: Diagnosis not present

## 2020-11-14 MED ORDER — IOPAMIDOL (ISOVUE-300) INJECTION 61%
75.0000 mL | Freq: Once | INTRAVENOUS | Status: AC | PRN
  Administered 2020-11-14: 75 mL via INTRAVENOUS

## 2020-11-14 NOTE — Assessment & Plan Note (Addendum)
Exertional dyspnea is probably a lot related to deconditioning, there is definitely some diastolic dysfunction involved.  Echo shows moderate LVH GRII DD.  She still has elevated blood pressure.--Not on any AV nodal agents because of fatigue and bradycardia.  Previously did not tolerate amlodipine because of fatigue, but now she is on 5 mg. She did not tolerate chlorthalidone-although it is listed, she is not taking, she did tolerate the 12.5 mg spironolactone, but has not tolerated ARB-HCTZ component.  Main symptom was itching.  Plan: Check baseline BMP today (reviewed above)  Continue amlodipine and titrate spironolactone up to 25 mg daily.  If stable after 1 week, will restart valsartan 20 mg (one half of 40 mg tablet) -> continue this regimen until seen by [facility] Pharm.D.  If doing well, would anticipate titrating valsartan up to full 40 mg daily.  Continue daily furosemide.

## 2020-11-14 NOTE — Assessment & Plan Note (Signed)
Complicated medication issues in the past.  Through [facility], she has been able to tolerate 5 mg of amlodipine. Chlorthalidone not tolerated, but she did tolerate restarting spironolactone 12.5 mg.  Plan: Check baseline labs today, increase prolactin to 20 mg daily.  If tolerated 1 week, will restart valsartan at 1/2 of 40 mg tablet daily (20 mg). Follow-up with [facility] as scheduled.

## 2020-11-14 NOTE — Assessment & Plan Note (Signed)
Stable heart rate 52 bpm.  Would not tolerate AV nodal agent. No significant signs of fatigue or chronotropic incompetence.  Will follow as she is now exercising more.  If necessary to use a medication like hydralazine, the reflex tachycardia may be beneficial.

## 2020-11-14 NOTE — Assessment & Plan Note (Addendum)
Echo did show GRII DD with moderate LVH.  There is also a major component of obesity and deconditioning.  She does have exertional dyspnea.  We will gradually titrate up antihypertensives. Remains on stable low dose of diuretic.  With no true CHF symptoms.  Continue exercise.

## 2020-11-22 ENCOUNTER — Encounter: Payer: Self-pay | Admitting: Podiatry

## 2020-11-22 ENCOUNTER — Other Ambulatory Visit: Payer: Self-pay

## 2020-11-22 ENCOUNTER — Ambulatory Visit: Payer: Medicare HMO | Admitting: Podiatry

## 2020-11-22 DIAGNOSIS — H524 Presbyopia: Secondary | ICD-10-CM | POA: Diagnosis not present

## 2020-11-22 DIAGNOSIS — K5900 Constipation, unspecified: Secondary | ICD-10-CM | POA: Insufficient documentation

## 2020-11-22 DIAGNOSIS — H353132 Nonexudative age-related macular degeneration, bilateral, intermediate dry stage: Secondary | ICD-10-CM | POA: Diagnosis not present

## 2020-11-22 DIAGNOSIS — H40053 Ocular hypertension, bilateral: Secondary | ICD-10-CM | POA: Diagnosis not present

## 2020-11-22 DIAGNOSIS — G629 Polyneuropathy, unspecified: Secondary | ICD-10-CM

## 2020-11-22 DIAGNOSIS — Q828 Other specified congenital malformations of skin: Secondary | ICD-10-CM | POA: Diagnosis not present

## 2020-11-22 DIAGNOSIS — H402231 Chronic angle-closure glaucoma, bilateral, mild stage: Secondary | ICD-10-CM | POA: Diagnosis not present

## 2020-11-22 DIAGNOSIS — B351 Tinea unguium: Secondary | ICD-10-CM

## 2020-11-22 DIAGNOSIS — M79674 Pain in right toe(s): Secondary | ICD-10-CM | POA: Diagnosis not present

## 2020-11-22 DIAGNOSIS — M79675 Pain in left toe(s): Secondary | ICD-10-CM | POA: Diagnosis not present

## 2020-11-22 DIAGNOSIS — R1111 Vomiting without nausea: Secondary | ICD-10-CM | POA: Insufficient documentation

## 2020-11-22 DIAGNOSIS — H35033 Hypertensive retinopathy, bilateral: Secondary | ICD-10-CM | POA: Diagnosis not present

## 2020-11-22 DIAGNOSIS — E669 Obesity, unspecified: Secondary | ICD-10-CM | POA: Insufficient documentation

## 2020-11-22 DIAGNOSIS — R159 Full incontinence of feces: Secondary | ICD-10-CM | POA: Insufficient documentation

## 2020-11-22 DIAGNOSIS — K648 Other hemorrhoids: Secondary | ICD-10-CM | POA: Insufficient documentation

## 2020-11-22 DIAGNOSIS — Z86010 Personal history of colonic polyps: Secondary | ICD-10-CM | POA: Insufficient documentation

## 2020-11-22 MED ORDER — GABAPENTIN 100 MG PO CAPS: 100.0000 mg | ORAL_CAPSULE | Freq: Three times a day (TID) | ORAL | 2 refills | Status: DC

## 2020-11-22 NOTE — Patient Instructions (Signed)

## 2020-11-25 NOTE — Progress Notes (Signed)
Subjective: 86 y.o. returns the office today for painful, elongated, thickened toenails which she cannot trim herself. Denies any redness or drainage around the nails.  She supinates have not been constant that last saw her over a year ago.  Also she is taking gabapentin 100 mg at nighttime which is helping but still having burning, stinging into the toes.  Denies any acute changes since last appointment and no new complaints today. Denies any systemic complaints such as fevers, chills, nausea, vomiting.   PCP: [name], MD  Objective: AAO 3, NAD DP/PT pulses palpable, CRT less than 3 seconds Nails hypertrophic, dystrophic, elongated, brittle, discolored 10. There is tenderness overlying the nails 1-5 bilaterally. There is no surrounding erythema or drainage along the nail sites. No open lesions or pre-ulcerative lesions are identified. Hyperkeratotic lesions bilateral submetatarsal five without any underlying ulceration.  Exam of infection.  No edema, erythema. No other areas of tenderness bilateral lower extremities. No overlying edema, erythema, increased warmth. No pain with calf compression, swelling, warmth, erythema.  Assessment: Patient presents with symptomatic onychomycosis, hyperkeratotic lesions, neuropathy  Plan: -Treatment options including alternatives, risks, complications were discussed -Nails sharply debrided 10 without complication/bleeding. -Hyperkeratotic lesion sharply debrided x2 without any complications or bleeding. -She is tolerating gabapentin well.  Increase to 100 mg three times a day.  Monitor for any side effects. -Discussed daily foot inspection. If there are any changes, to call the office immediately.  -Follow-up in 3 months or sooner if any problems are to arise. In the meantime, encouraged to call the office with any questions, concerns, changes symptoms.  Celesta Gentile, DPM

## 2020-11-28 ENCOUNTER — Encounter: Payer: Self-pay | Admitting: Licensed Clinical Social Worker

## 2020-11-28 NOTE — Progress Notes (Signed)
[facility] Quality of Life Screening Clinical Social Work  Clinical Social Work received follow-up Quality of Life Screener (QOL-CS) back from patient.   Patient scored a 40 which indicates high quality of life. This is 9.2 lower than initial score indicating an decreased, although still high, quality of life. Patient noted that some days are great but some she feels very tired and has some confusion.  Patient can continue to access support services, including support groups and resource referrals, as needed.     Alisha [name] E Alisha Washinton, LCSW

## 2020-11-29 ENCOUNTER — Other Ambulatory Visit: Payer: Self-pay

## 2020-11-29 ENCOUNTER — Ambulatory Visit (INDEPENDENT_AMBULATORY_CARE_PROVIDER_SITE_OTHER): Payer: Medicare HMO | Admitting: Pharmacist

## 2020-11-29 VITALS — BP 156/62 | HR 61

## 2020-11-29 DIAGNOSIS — I10 Essential (primary) hypertension: Secondary | ICD-10-CM | POA: Diagnosis not present

## 2020-11-29 LAB — BASIC METABOLIC PANEL
BUN/Creatinine Ratio: 26 (ref 12–28)
BUN: 20 mg/dL (ref 8–27)
CO2: 20 mmol/L (ref 20–29)
Calcium: 9.5 mg/dL (ref 8.7–10.3)
Chloride: 100 mmol/L (ref 96–106)
Creatinine, Ser: 0.76 mg/dL (ref 0.57–1.00)
GFR calc Af Amer: 83 mL/min/{1.73_m2} (ref >59)
GFR calc non Af Amer: 72 mL/min/{1.73_m2} (ref >59)
Glucose: 271 mg/dL — ABNORMAL HIGH (ref 65–99)
Potassium: 4.1 mmol/L (ref 3.5–5.2)
Sodium: 138 mmol/L (ref 134–144)

## 2020-11-29 MED ORDER — SPIRONOLACTONE 25 MG PO TABS: 12.5000 mg | ORAL_TABLET | Freq: Every day | ORAL | 3 refills | Status: DC

## 2020-11-29 NOTE — Progress Notes (Signed)
Patient ID: Alisha [name]                 DOB: 03-Feb-1935                      MRN: [id number]     HPI: Alisha [name] is a 86 y.o. female referred by [name] to HTN clinic. PMH is significant for chronic bradycardia, diastolic dysfunction, HTN, Breast cancer w/ radiation, hypothyroidism, pre-DM.   She was last seen in HTN clinic for follow-up on 11/13/19. Her blood pressure was elevated and spironolactone 12.5mg  was started.  She is using a wrist cuff because her upper arm cuff is too small and she cannot afford a larger cuff. She saw [name] on 11/12/20. He increased spironolactone to 25mg  daily and started valsartan 20mg  daily.   Patient presents today to clinic for follow up. She is a very pleasant lady. She states that she has not had issues with itching. She noticed that she wakes up every 2 hours to pee at night since increasing spironolactone to 25mg  daily. Doesn't feel refreshed when she wakes up. She has occasional dizziness that resolves with meclizine. Occasional headache. No blurred vision. SOB from asthma and no major swelling. She reports her energy is a lot better. She is taking some herbal supplements that she thinks is helping. She is also exercising every day for 30 min and thinks this helps her feel more energized too.  Brings her home BP cuff in. She is putting on correctly now. Compared to clinic reading today 156/62 clinic right arm, Home left wrist-158/75. She is taking the whole tablet of valsartan (40mg ). Home BP not improved, still in the 150's/70's.  Current HTN meds: chlorthalidone 25 mg daily (AM), amlodipine 5mg  daily, spironolactone 25mg  daily, valsartan 40mg  daily, PRN Lasix 20mg     Previously tried: lisinopril (low energy, itching), valsartan-HCTZ (low energy, itchiness), amlodipine 2.5mg  daily (fatique, dizziness, "skin issues"), spironolactone 25 mg daily (itching), losartan 12.5 mg daily (itching)  BP goal: <130/80  Family History: HTN in  mother and father  Social History: Denies alcohol or smoking  Diet: Continues to work on her diet - enjoys vegetables, beans, chicken, fish, occasional homemade burger (1x/mo), salad - does not add salt to food  Drinks: 1 cup of coffee/day, 6 8-oz glasses of water per day   Exercise: Increased exercise to 30 minutes per day - using weights, exercise bike, and treadmill, treadmill (10 min each per day)  Home BP readings: 154/70's  Wt Readings from Last 3 Encounters:  11/12/20 222 lb 6.4 oz (100.9 kg)  09/05/20 228 lb 11.2 oz (103.7 kg)  06/12/20 223 lb 12.8 oz (101.5 kg)   BP Readings from Last 3 Encounters:  11/12/20 (!) 150/80  10/29/20 (!) 144/72  10/01/20 (!) 170/72   Pulse Readings from Last 3 Encounters:  11/12/20 (!) 52  10/01/20 72  09/18/20 (!) 58    Renal function: CrCl cannot be calculated (Unknown ideal weight.).  Past Medical History:  Diagnosis Date  . Arthritis   . Asthma    Never smoker  . Blood transfusion    over 40 y ears  . Breast mass, left   . Cancer ([place])   . History of radiation therapy 09/07/19- 10/06/19   Left Breast 15 fractions of 2.67 Gy to total 40.05 Gy. Left breast boost 5 fractions of 2 Gy each to total 10 Gy  . Hypertension    on meds  . Hypothyroidism  on meds  . Pre-diabetes     Current Outpatient Medications on File Prior to Visit  Medication Sig Dispense Refill  . acetaminophen (TYLENOL) 500 MG tablet Take 1,000 mg by mouth every 6 (six) hours as needed for moderate pain.     Marland Kitchen albuterol (PROVENTIL HFA;VENTOLIN HFA) 108 (90 Base) MCG/ACT inhaler Inhale 1-2 puffs into the lungs every 6 (six) hours as needed for wheezing or shortness of breath.     Marland Kitchen amLODipine (NORVASC) 5 MG tablet Take 1 tablet (5 mg total) by mouth daily. 90 tablet 3  . Artificial Tear Solution (SOOTHE XP OP) Place 1 drop into both eyes 3 (three) times daily as needed (dry or irritated eyes).    . Biotin 5 MG TABS Take 5 mg by mouth daily.     . cetaphil  (CETAPHIL) cream Apply 1 application topically 2 (two) times daily.     . chlorthalidone (HYGROTON) 25 MG tablet Take 1 tablet (25 mg total) by mouth daily. 90 tablet 3  . diphenhydrAMINE HCl 50 MG/30ML LIQD Take 50 mg by mouth at bedtime as needed (sleep).     Marland Kitchen FLUAD QUADRIVALENT 0.5 ML injection     . fluticasone (FLONASE) 50 MCG/ACT nasal spray Place 2 sprays into both nostrils daily as needed for allergies or rhinitis.    . furosemide (LASIX) 20 MG tablet Take 20 mg by mouth daily as needed for fluid or edema.    . gabapentin (NEURONTIN) 100 MG capsule Take 1 capsule (100 mg total) by mouth 3 (three) times daily. 90 capsule 2  . levothyroxine (SYNTHROID, LEVOTHROID) 88 MCG tablet Take 88 mcg by mouth daily before breakfast.     . meclizine (ANTIVERT) 25 MG tablet Take 12.5 mg by mouth 3 (three) times daily as needed for dizziness.    . Multiple Vitamins-Minerals (ICAPS AREDS 2 PO) Take 1 capsule by mouth 2 (two) times daily.     Marland Kitchen olmesartan (BENICAR) 20 MG tablet     . OVER THE COUNTER MEDICATION 300 mg in the morning and at bedtime. ADRENAL SUPPORT WITH ASHWAGANDHA    . OVER THE COUNTER MEDICATION Use as directed 304 mg in the mouth or throat in the morning and at bedtime. CHYAVANPRISH HERBAL BLEND    . potassium chloride SA (KLOR-CON) 20 MEQ tablet Take 20 mEq by mouth daily as needed (when taking furosemide).    Marland Kitchen spironolactone (ALDACTONE) 25 MG tablet Take 1 tablet (25 mg total) by mouth daily. 90 tablet 3  . valsartan (DIOVAN) 40 MG tablet Take 0.5 tablets (20 mg total) by mouth daily. 15 tablet 3   No current facility-administered medications on file prior to visit.    Allergies  Allergen Reactions  . Levofloxacin     Other reaction(s): Unknown  . Lisinopril Itching    No energy  . Penicillins Itching and Rash    Has patient had a PCN reaction causing immediate rash, facial/tongue/throat swelling, SOB or lightheadedness with hypotension: No Has patient had a PCN reaction  causing severe rash involving mucus membranes or skin necrosis: No Has patient had a PCN reaction that required hospitalization: No Has patient had a PCN reaction occurring within the last 10 years: No If all of the above answers are "NO", then may proceed with Cephalosporin use.  . Sulfa Antibiotics Itching and Rash    Assessment/Plan:  1. Hypertension - Blood pressure above goal of <130/80 in clinic today. Since she is urinating frequently at night since increasing spironolactone, will  try decreasing back to 12.5mg  daily. She did not see a BP improvement with the 25mg . She was taking in AM and is not drinking much at bedtime. If her sleep is being affected, that can increase her BP. She will get labs today. If scr and K are good, will plan to increase valsartan to 160mg  daily. I will call patient tomorrow to finalize plan and schedule follow up. Encouraged her to continue her exercise.   Ramond Dial, Pharm.D, BCPS, CPP [facility]  [address], [place], [place] [address]  Phone: [phone]; Fax: [phone]

## 2020-11-29 NOTE — Patient Instructions (Addendum)
It was a pleasure to see you again!  Please decrease your spironolactone to 12.5mg  daily (1/2 tablet).  I will call you tomorrow with your lab results and we will discuss additional changes.  Keep up the good work with exercise  Call me at [phone] with any questions

## 2020-11-30 ENCOUNTER — Telehealth: Payer: Self-pay | Admitting: Pharmacist

## 2020-11-30 MED ORDER — VALSARTAN 160 MG PO TABS: 160.0000 mg | ORAL_TABLET | Freq: Every day | ORAL | 3 refills | Status: DC

## 2020-11-30 NOTE — Telephone Encounter (Signed)
BMP stable after starting valsartan 40mg . Will increase to valsartan 160mg  daily as discussed in the office yesterday. Recheck BMP and BP in office in 2 weeks. Called patient and reviewed the above information. Advised she can take 4 of the valsartan 40 to make the 160mg  dose to use up her 40mg  tablets if she would like. Rx for valsartan 160mg  1 tablet daily sent to pharmacy. Follow up in clinic on 2/24

## 2020-12-06 ENCOUNTER — Other Ambulatory Visit: Payer: Self-pay | Admitting: Family Medicine

## 2020-12-06 ENCOUNTER — Other Ambulatory Visit (HOSPITAL_COMMUNITY): Payer: Self-pay | Admitting: Family Medicine

## 2020-12-06 DIAGNOSIS — R911 Solitary pulmonary nodule: Secondary | ICD-10-CM

## 2020-12-07 DIAGNOSIS — K449 Diaphragmatic hernia without obstruction or gangrene: Secondary | ICD-10-CM | POA: Diagnosis not present

## 2020-12-07 DIAGNOSIS — R918 Other nonspecific abnormal finding of lung field: Secondary | ICD-10-CM | POA: Diagnosis not present

## 2020-12-13 ENCOUNTER — Other Ambulatory Visit (HOSPITAL_COMMUNITY): Payer: Medicare HMO

## 2020-12-13 ENCOUNTER — Other Ambulatory Visit: Payer: Self-pay

## 2020-12-13 ENCOUNTER — Ambulatory Visit (INDEPENDENT_AMBULATORY_CARE_PROVIDER_SITE_OTHER): Payer: Medicare HMO | Admitting: Pharmacist

## 2020-12-13 VITALS — BP 132/58 | HR 59

## 2020-12-13 DIAGNOSIS — I10 Essential (primary) hypertension: Secondary | ICD-10-CM | POA: Diagnosis not present

## 2020-12-13 LAB — BASIC METABOLIC PANEL
BUN/Creatinine Ratio: 30 — ABNORMAL HIGH (ref 12–28)
BUN: 25 mg/dL (ref 8–27)
CO2: 21 mmol/L (ref 20–29)
Calcium: 9.3 mg/dL (ref 8.7–10.3)
Chloride: 101 mmol/L (ref 96–106)
Creatinine, Ser: 0.82 mg/dL (ref 0.57–1.00)
GFR calc Af Amer: 76 mL/min/{1.73_m2} (ref >59)
GFR calc non Af Amer: 66 mL/min/{1.73_m2} (ref >59)
Glucose: 233 mg/dL — ABNORMAL HIGH (ref 65–99)
Potassium: 3.7 mmol/L (ref 3.5–5.2)
Sodium: 139 mmol/L (ref 134–144)

## 2020-12-13 NOTE — Progress Notes (Signed)
Patient ID: Alisha [name]                 DOB: 1935-08-18                      MRN: [id number]     HPI: Alisha [name] is a 86 y.o. female referred by [name] to HTN clinic. PMH is significant for chronic bradycardia, diastolic dysfunction, HTN, Breast cancer w/ radiation, hypothyroidism, pre-DM.   She was last seen in HTN clinic for follow-up on 11/30/20. Her blood pressure had not improved on higher dose of spironolactone and valsartan 40mg . She complained of frequent urination at night, therefore was decreased back to 12.5mg  daily. BMP was stable and valsartan was increased to 160mg  daily.   Patient presents today for follow up. She denies dizziness, lightheadedness, headache, blurred vision, SOB or swelling. She is sleeping better and has good energy. Urination at night has decreased since decreasing spironolactone. Her home BP has improved over the last few days. Valsartan increased about 10-11 days ago. No itching. The more the reads the more she thinks her problems she thought were due to her meds were due to stress. Concerned about her sister who wont take her medications and is close to HD.  Home cuff previously compared to clinic reading 156/62 clinic right arm, Home left wrist-158/75.  Current HTN meds: chlorthalidone 25 mg daily (AM), amlodipine 5mg  daily, spironolactone 12.5mg  daily, valsartan 160mg  daily, PRN Lasix 20mg     Previously tried: lisinopril (low energy, itching), valsartan-HCTZ (low energy, itchiness), amlodipine 2.5mg  daily (fatique, dizziness, "skin issues"), spironolactone 25 mg daily (itching), losartan 12.5 mg daily (itching)  BP goal: <130/80  Family History: HTN in mother and father  Social History: Denies alcohol or smoking  Diet: Continues to work on her diet - enjoys vegetables, beans, chicken, fish, occasional homemade burger (1x/mo), salad - does not add salt to food  Drinks: 1 cup of coffee/day, 6 8-oz glasses of water per day    Exercise: Increased exercise to 30 minutes per day - using weights, exercise bike, and treadmill, treadmill (10 min each per day)  Home BP readings: 157/73, 148/71, 163/69, 153/49, 166/64, 149/60, 125/61, 139/61, 125/63  Wt Readings from Last 3 Encounters:  11/12/20 222 lb 6.4 oz (100.9 kg)  09/05/20 228 lb 11.2 oz (103.7 kg)  06/12/20 223 lb 12.8 oz (101.5 kg)   BP Readings from Last 3 Encounters:  11/29/20 (!) 156/62  11/12/20 (!) 150/80  10/29/20 (!) 144/72   Pulse Readings from Last 3 Encounters:  11/29/20 61  11/12/20 (!) 52  10/01/20 72    Renal function: CrCl cannot be calculated (Unknown ideal weight.).  Past Medical History:  Diagnosis Date  . Arthritis   . Asthma    Never smoker  . Blood transfusion    over 40 y ears  . Breast mass, left   . Cancer ([facility])   . History of radiation therapy 09/07/19- 10/06/19   Left Breast 15 fractions of 2.67 Gy to total 40.05 Gy. Left breast boost 5 fractions of 2 Gy each to total 10 Gy  . Hypertension    on meds  . Hypothyroidism    on meds  . Pre-diabetes     Current Outpatient Medications on File Prior to Visit  Medication Sig Dispense Refill  . acetaminophen (TYLENOL) 500 MG tablet Take 1,000 mg by mouth every 6 (six) hours as needed for moderate pain.     Marland Kitchen albuterol (PROVENTIL  HFA;VENTOLIN HFA) 108 (90 Base) MCG/ACT inhaler Inhale 1-2 puffs into the lungs every 6 (six) hours as needed for wheezing or shortness of breath.     Marland Kitchen amLODipine (NORVASC) 5 MG tablet Take 1 tablet (5 mg total) by mouth daily. 90 tablet 3  . Artificial Tear Solution (SOOTHE XP OP) Place 1 drop into both eyes 3 (three) times daily as needed (dry or irritated eyes).    . Biotin 5 MG TABS Take 5 mg by mouth daily.     . cetaphil (CETAPHIL) cream Apply 1 application topically 2 (two) times daily.     . chlorthalidone (HYGROTON) 25 MG tablet Take 1 tablet (25 mg total) by mouth daily. 90 tablet 3  . diphenhydrAMINE HCl 50 MG/30ML LIQD Take 50 mg  by mouth at bedtime as needed (sleep).  (Patient not taking: Reported on 11/29/2020)    . FLUAD QUADRIVALENT 0.5 ML injection     . fluticasone (FLONASE) 50 MCG/ACT nasal spray Place 2 sprays into both nostrils daily as needed for allergies or rhinitis.    . furosemide (LASIX) 20 MG tablet Take 20 mg by mouth daily as needed for fluid or edema.    . gabapentin (NEURONTIN) 100 MG capsule Take 1 capsule (100 mg total) by mouth 3 (three) times daily. 90 capsule 2  . levothyroxine (SYNTHROID, LEVOTHROID) 88 MCG tablet Take 88 mcg by mouth daily before breakfast.     . meclizine (ANTIVERT) 25 MG tablet Take 12.5 mg by mouth 3 (three) times daily as needed for dizziness.    . Multiple Vitamins-Minerals (ICAPS AREDS 2 PO) Take 1 capsule by mouth 2 (two) times daily.     Marland Kitchen OVER THE COUNTER MEDICATION 300 mg in the morning and at bedtime. ADRENAL SUPPORT WITH ASHWAGANDHA    . OVER THE COUNTER MEDICATION Use as directed 304 mg in the mouth or throat in the morning and at bedtime. CHYAVANPRISH HERBAL BLEND    . potassium chloride SA (KLOR-CON) 20 MEQ tablet Take 20 mEq by mouth daily as needed (when taking furosemide).    Marland Kitchen spironolactone (ALDACTONE) 25 MG tablet Take 0.5 tablets (12.5 mg total) by mouth daily. 90 tablet 3  . valsartan (DIOVAN) 160 MG tablet Take 1 tablet (160 mg total) by mouth daily. 90 tablet 3   No current facility-administered medications on file prior to visit.    Allergies  Allergen Reactions  . Levofloxacin     Other reaction(s): Unknown  . Lisinopril Itching    No energy  . Penicillins Itching and Rash    Has patient had a PCN reaction causing immediate rash, facial/tongue/throat swelling, SOB or lightheadedness with hypotension: No Has patient had a PCN reaction causing severe rash involving mucus membranes or skin necrosis: No Has patient had a PCN reaction that required hospitalization: No Has patient had a PCN reaction occurring within the last 10 years: No If all of the  above answers are "NO", then may proceed with Cephalosporin use.  . Sulfa Antibiotics Itching and Rash    Assessment/Plan:  1. Hypertension - Blood pressure above goal of <130/80 in clinic today. However much improvement from previous visits. She started higher dose of valsartan less than 2 weeks ago. Home readings trending in the right direction. Continue chlorthalidone 25 mg daily (AM), amlodipine 5mg  daily, spironolactone 12.5mg  daily and valsartan 160mg  daily. Follow up in clinic in 2 weeks. Checking BMP today due to increase in valsartan dose.   Ramond Dial, Pharm.D, BCPS, CPP [facility]  [facility] [address], [place], [place] [address]  Phone: [phone]; Fax: [phone]

## 2020-12-13 NOTE — Patient Instructions (Addendum)
No changes today, continue chlorthalidone 25 mg daily (AM), amlodipine 5mg  daily, spironolactone 12.5mg  daily, valsartan 160mg  daily  Continue checking blood pressure at home. Keep exercising  Call me at [phone] with any questions

## 2020-12-14 ENCOUNTER — Telehealth: Payer: Self-pay | Admitting: Pharmacist

## 2020-12-14 ENCOUNTER — Other Ambulatory Visit: Payer: Self-pay | Admitting: Oncology

## 2020-12-14 DIAGNOSIS — U099 Post covid-19 condition, unspecified: Secondary | ICD-10-CM | POA: Diagnosis not present

## 2020-12-14 DIAGNOSIS — C50912 Malignant neoplasm of unspecified site of left female breast: Secondary | ICD-10-CM | POA: Diagnosis not present

## 2020-12-14 DIAGNOSIS — Z171 Estrogen receptor negative status [ER-]: Secondary | ICD-10-CM

## 2020-12-14 DIAGNOSIS — C50412 Malignant neoplasm of upper-outer quadrant of left female breast: Secondary | ICD-10-CM

## 2020-12-14 NOTE — Telephone Encounter (Addendum)
BMP stable after increase in valsartan dose to 160mg  daily. Blood sugar is high. Should discuss with her PCP. Continue chlorthalidone 25 mg daily (AM), amlodipine 5mg  daily, spironolactone 12.5mg  daily, valsartan 160mg  daily, PRN Lasix 20mg   LVM for pt to call back

## 2020-12-17 NOTE — Telephone Encounter (Signed)
Spoke with patient and advised labs are stable. Suggested she speak with her PCP about blood sugar.

## 2020-12-19 NOTE — Progress Notes (Signed)
[place]  [phone] [phone]     ID: Alisha [name] DOB: [birth year]  MR#: [id number]  XBL#:[id number]  Patient Care Team: [name], MD as PCP - General (Family Medicine) Erroll Luna, MD as Consulting Physician (General Surgery) Eppie Gibson, MD as Attending Physician (Radiation Oncology) Leonie Man, MD as Consulting Physician (Cardiology) Maurico Perrell, Virgie Dad, MD as Consulting Physician (Oncology) Collene Gobble, MD as Consulting Physician (Pulmonary Disease) Chauncey Cruel, MD OTHER MD:  CHIEF COMPLAINT: triple negative breast cancer  CURRENT TREATMENT: Observation   INTERVAL HISTORY: Alisha [name] returns today for follow up of her triple negative breast cancer. She continues under observation.  Since her last visit, she underwent bilateral diagnostic mammography with tomography at [place] on 06/18/2020 showing: breast density category B; no evidence of malignancy in either breast.   She has a history of multiple small pulmonary nodules documented on CT of the chest without contrast 03/08/2019.  On 11/14/2020 she underwent a follow-up chest CT showing: new 9 mm right upper lobe pulmonary nodule, borderline in size for further evaluation with PET; multiple other stable smaller pulmonary nodules; no adenopathy or pleural effusion.  She was then referred for PET scanning at [facility] 12/07/2020.  This showed -Hypermetabolic right upper lobe pulmonary nodule concerning for neoplasm.  -Other nodules are nonmetabolic are below the resolution of PET attention on follow-up.  -No hypermetabolic thoracic nodes or findings below the diaphragm.   The patient returns today for further evaluation and treatment.  She is also scheduled to see pulmonary, Dr. Kyung Rudd, tomorrow 12/21/2020   REVIEW OF SYSTEMS: [name] tells me she generally feels well.  She has not noted a change in her functional status, unusual pain, fever, rash,  bleeding, unexplained fatigue or unexplained weight loss.  She does not have any suspicious painful areas or areas of persistent pain.  She denies significant headaches visual changes cough phlegm production or pleurisy.  A detailed review of systems was otherwise stable   COVID 19 VACCINATION STATUS: infection 11/2019; Alisha [name] x2, most recently 01/2020   HISTORY OF CURRENT ILLNESS: From the original intake note:  Alisha [name] was noted to have a left breast upper-outer quadrant distortion on screening mammogram in 2017. She proceeded to biopsy ([id number]), which showed a complex sclerosing lesion. She opted to undergo left lumpectomy ([id number]) on 08/11/2016 under Dr. Brantley Stage, which revealed a radial scar with calcifications.   She presented for routine screening mammogram, which showed a possible mass and adjacent calcifications in the left breast. She was referred to [facility] for further imaging on 06/16/2019. Physical exam performed that day showed a palpable soft thickening in the upper central aspect of the left breast. She underwent left diagnostic mammography with tomography and ultrasonography showing: breast density category B; suspicious 7 mm mass in the 12 o'clock location of the left breast; small indeterminate group of calcifications 4 mm anterior to the mass warrant excision if the mass is positive for malignancy; left axilla negative for adenopathy.  Accordingly on 06/22/2019 she proceeded to biopsy of the left breast area in question. The pathology from this procedure ([id number]) showed: invasive ductal carcinoma, grade 3. Prognostic indicators significant for: estrogen receptor, 0% negative and progesterone receptor, 0% negative. Proliferation marker Ki67 at 30%. HER2 negative by immunohistochemistry (0).  The patient's subsequent history is as detailed below.   PAST MEDICAL HISTORY: Past Medical History:  Diagnosis Date  . Arthritis   . Asthma    Never smoker   .  Blood transfusion    over 40 y ears  . Breast mass, left   . Cancer ([place])   . History of radiation therapy 09/07/19- 10/06/19   Left Breast 15 fractions of 2.67 Gy to total 40.05 Gy. Left breast boost 5 fractions of 2 Gy each to total 10 Gy  . Hypertension    on meds  . Hypothyroidism    on meds  . Pre-diabetes     PAST SURGICAL HISTORY: Past Surgical History:  Procedure Laterality Date  . ABDOMINAL HYSTERECTOMY     32 yrs  . BREAST LUMPECTOMY WITH RADIOACTIVE SEED AND SENTINEL LYMPH NODE BIOPSY Left 07/27/2019   Procedure: LEFT BREAST LUMPECTOMY WITH RADIOACTIVE SEED AND LEFT SENTINEL LYMPH NODE MAPPING;  Surgeon: Erroll Luna, MD;  Location: [place];  Service: General;  Laterality: Left;  . BREAST LUMPECTOMY WITH RADIOACTIVE SEED LOCALIZATION Left 08/11/2016   Procedure: LEFT BREAST LUMPECTOMY WITH RADIOACTIVE SEED LOCALIZATION;  Surgeon: Erroll Luna, MD;  Location: [place];  Service: General;  Laterality: Left;  . CATARACT EXTRACTION, BILATERAL    . CORONARY CALCIUM SCORE-CT ANGIOGRAM  04/2019   Small, < 4m nodule noted.  Difficult study to read.  Coronary calcium score was 11.  Significant motion artifact, but visible territory showed minimal disease.  Minimal calcium noted in the ostial LAD but no suggestion of significant CAD.  .Marland KitchenEYE SURGERY    . JOINT REPLACEMENT Bilateral    left knee 2012  . KNEE ARTHROPLASTY  02/17/2012   Procedure: COMPUTER ASSISTED TOTAL KNEE ARTHROPLASTY;  Surgeon: GMeredith Pel MD;  [place]  Service: Orthopedics;  Laterality: Right;  Right total knee replacement  . TRANSTHORACIC ECHOCARDIOGRAM  04/02/2020   Normal LV function.  No R WMA.  Moderate LVH.  GRII DD with elevated filling pressures.  Moderate LA dilation with trivial MR.  Mild aortic valve sclerosis.    FAMILY HISTORY: Family History  Problem Relation Age of Onset  . Other Mother        died @ 829- but was healthy  . Hypertension Sister   .  Diabetes Sister   . Diabetes Brother        no longer on medications.   . Anesthesia problems Neg Hx   . Hypotension Neg Hx   . Malignant hyperthermia Neg Hx   . Pseudochol deficiency Neg Hx   Patient's father was in his mid 881 years old when he died from unknown causes. Patient's mother died from "natural causes" at age 84 The patient denies a family hx of breast or ovarian cancer. She has 5 brothers and 1 sister.   GYNECOLOGIC HISTORY:  No LMP recorded. Patient is postmenopausal. 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  She describes herself a single.  Her [name]  She has multiple medical problems.  The patient's son died at age 536  The patient has 1 grandchild, Alisha [name] [employer]  He has 4 children.  The patient herself attends a local nForadaDIRECTIVES: The patient intends to name her grandson Alisha Bridgeas her healthcare power of  attorney.  He can be reached at [phone].  She was given the appropriate documents to complete and notarize at her discretion at the time of her 07/21/2019 visit   HEALTH MAINTENANCE: Social History   Tobacco Use  . Smoking status: Never Smoker  . Smokeless tobacco: Never Used  Vaping Use  . Vaping Use: Never used  Substance Use Topics  . Alcohol use: No  . Drug use: No     Allergies  Allergen Reactions  . Levofloxacin     Other reaction(s): Unknown  . Lisinopril Itching    No energy  . Penicillins Itching and Rash    Has patient had a PCN reaction causing immediate rash, facial/tongue/throat swelling, SOB or  lightheadedness with hypotension: No Has patient had a PCN reaction causing severe rash involving mucus membranes or skin necrosis: No Has patient had a PCN reaction that required hospitalization: No Has patient had a PCN reaction occurring within the last 10 years: No If all of the above answers are "NO", then may proceed with Cephalosporin use.  . Sulfa Antibiotics Itching and Rash    Current Outpatient Medications  Medication Sig Dispense Refill  . acetaminophen (TYLENOL) 500 MG tablet Take 1,000 mg by mouth every 6 (six) hours as needed for moderate pain.     Marland Kitchen albuterol (PROVENTIL HFA;VENTOLIN HFA) 108 (90 Base) MCG/ACT inhaler Inhale 1-2 puffs into the lungs every 6 (six) hours as needed for wheezing or shortness of breath.     Marland Kitchen amLODipine (NORVASC) 5 MG tablet Take 1 tablet (5 mg total) by mouth daily. 90 tablet 3  . Artificial Tear Solution (SOOTHE XP OP) Place 1 drop into both eyes 3 (three) times daily as needed (dry or irritated eyes).    . Biotin 5 MG TABS Take 5 mg by mouth daily.     . cetaphil (CETAPHIL) cream Apply 1 application topically 2 (two) times daily.     . chlorthalidone (HYGROTON) 25 MG tablet Take 1 tablet (25 mg total) by mouth daily. 90 tablet 3  . diphenhydrAMINE HCl 50 MG/30ML LIQD Take 50 mg by mouth at bedtime as needed (sleep).  (Patient not taking: Reported on 11/29/2020)    . FLUAD QUADRIVALENT 0.5 ML injection     . fluticasone (FLONASE) 50 MCG/ACT nasal spray Place 2 sprays into both nostrils daily as needed for allergies or rhinitis.    . furosemide (LASIX) 20 MG tablet Take 20 mg by mouth daily as needed for fluid or edema.    . gabapentin (NEURONTIN) 100 MG capsule Take 1 capsule (100 mg total) by mouth 3 (three) times daily. 90 capsule 2  . levothyroxine (SYNTHROID, LEVOTHROID) 88 MCG tablet Take 88 mcg by mouth daily before breakfast.     . meclizine (ANTIVERT) 25 MG tablet Take 12.5 mg by mouth 3 (three) times daily as needed for dizziness.    .  Multiple Vitamins-Minerals (ICAPS AREDS 2 PO) Take 1 capsule by mouth 2 (two) times daily.     Marland Kitchen OVER THE COUNTER MEDICATION 300 mg in the morning and at bedtime. ADRENAL SUPPORT WITH ASHWAGANDHA    . OVER THE COUNTER MEDICATION Use as directed 304 mg in the mouth or throat in the morning and at bedtime. CHYAVANPRISH HERBAL BLEND    . potassium chloride SA (KLOR-CON) 20 MEQ tablet Take 20 mEq by mouth daily as needed (when taking furosemide).    Marland Kitchen spironolactone (ALDACTONE) 25 MG tablet Take 0.5 tablets (12.5 mg total) by mouth daily. [place]  tablet 3  . valsartan (DIOVAN) 160 MG tablet Take 1 tablet (160 mg total) by mouth daily. 90 tablet 3   No current facility-administered medications for this visit.    OBJECTIVE: African-American woman who appears stated age  65:   12/20/20 1523  BP: (!) 174/71  Pulse: (!) 57  Resp: 18  Temp: (!) 97.4 F (36.3 C)  SpO2: 100%     Body mass index is 37.75 kg/m.   Wt Readings from Last 3 Encounters:  12/20/20 219 lb 14.4 oz (99.7 kg)  11/12/20 222 lb 6.4 oz (100.9 kg)  09/05/20 228 lb 11.2 oz (103.7 kg)      ECOG FS:1 - Symptomatic but completely ambulatory  Sclerae unicteric, EOMs intact Wearing a mask No cervical or supraclavicular adenopathy Lungs no rales or rhonchi Heart regular rate and rhythm Abd soft, nontender, positive bowel sounds MSK no focal spinal tenderness, no upper extremity lymphedema Neuro: nonfocal, well oriented, appropriate affect Breasts: The right breast is benign.  The left breast has undergone lumpectomy and radiation.  I do not find any area suspicious for local recurrence.  Both axillae are benign   LAB RESULTS:  CMP     Component Value Date/Time   NA 136 12/20/2020 1456   NA 139 12/13/2020 1035   K 3.7 12/20/2020 1456   CL 104 12/20/2020 1456   CO2 24 12/20/2020 1456   GLUCOSE 164 (H) 12/20/2020 1456   BUN 34 (H) 12/20/2020 1456   BUN 25 12/13/2020 1035   CREATININE 0.88 12/20/2020 1456   CREATININE  0.78 07/21/2019 1525   CALCIUM 8.9 12/20/2020 1456   PROT 6.6 12/20/2020 1456   ALBUMIN 3.9 12/20/2020 1456   AST 15 12/20/2020 1456   AST 15 07/21/2019 1525   ALT 14 12/20/2020 1456   ALT 19 07/21/2019 1525   ALKPHOS 83 12/20/2020 1456   BILITOT 0.2 (L) 12/20/2020 1456   BILITOT 0.2 (L) 07/21/2019 1525   GFRNONAA >60 12/20/2020 1456   GFRNONAA >60 07/21/2019 1525   GFRAA 76 12/13/2020 1035   GFRAA >60 07/21/2019 1525    No results found for: TOTALPROTELP, ALBUMINELP, A1GS, A2GS, BETS, BETA2SER, GAMS, MSPIKE, SPEI  No results found for: KPAFRELGTCHN, LAMBDASER, KAPLAMBRATIO  Lab Results  Component Value Date   WBC 10.5 12/20/2020   NEUTROABS 6.8 12/20/2020   HGB 11.1 (L) 12/20/2020   HCT 33.6 (L) 12/20/2020   MCV 96.0 12/20/2020   PLT 292 12/20/2020   No results found for: LABCA2  No components found for: [id number]  No results for input(s): INR in the last 168 hours.  No results found for: LABCA2  No results found for: EVO350  No results found for: KXF818  No results found for: EXH371  No results found for: CA2729  No components found for: HGQUANT  No results found for: CEA1 / No results found for: CEA1   No results found for: AFPTUMOR  No results found for: CHROMOGRNA  No results found for: HGBA, HGBA2QUANT, HGBFQUANT, HGBSQUAN (Hemoglobinopathy evaluation)   No results found for: LDH  No results found for: IRON, TIBC, IRONPCTSAT (Iron and TIBC)  No results found for: FERRITIN  Urinalysis    Component Value Date/Time   COLORURINE YELLOW 07/02/2018 1752   APPEARANCEUR CLEAR 07/02/2018 1752   LABSPEC 1.021 07/02/2018 1752   PHURINE 5.0 07/02/2018 1752   GLUCOSEU NEGATIVE 07/02/2018 1752   HGBUR NEGATIVE 07/02/2018 1752   BILIRUBINUR NEGATIVE 07/02/2018 1752   KETONESUR 20 (A) 07/02/2018 1752  PROTEINUR NEGATIVE 07/02/2018 1752   UROBILINOGEN 1.0 02/20/2012 0927   NITRITE NEGATIVE 07/02/2018 1752   LEUKOCYTESUR NEGATIVE 07/02/2018 1752     STUDIES: No results found. IMPRESSION:  -Hypermetabolic right upper lobe pulmonary nodule concerning for neoplasm.  -Other nodules are nonmetabolic are below the resolution of PET attention on follow-up.  -No hypermetabolic thoracic nodes or findings below the diaphragm.    Electronically Signed by: Florence Canner on 12/07/2020 1:00 PM  Narrative Performed by [id number] PROCEDURE: F-18 FDG PET/CT scan from the skull base to the mid thighs.   INDICATION: Solitary pulmonary nodule  initial evaluation and treatment strategy.   TECHNIQUE: PET/CT imaging was performed from the skull base to the mid thighs using routine PET acquisition following evaluation of serum glucose level and intravenous administration of F-18 FDG, per standard protocol. A CT scan was performed for localization and attenuation correction purposes only and is not intended for diagnosis separate from the PET scan.   Radiopharmaceutical: 9.9 mCi of F-18 FDG, intravenously.  Blood Glucose level prior to FDG injection: 153 mg/dL.  Time from injection to imaging: 65 minutes.   COMPARISON: Not available   FINDINGS:   Head/Neck:  No abnormal activity.   Chest:  -Right upper lobe pulmonary nodule with an SUV max of 4.2 on CT 72 measuring 6 x 7 mm  -No other hypermetabolic pulmonary nodules. Nodularity in the lateral right upper lobe image 91-92 is not significantly hypermetabolic or below the resolution of PET.  -No hypermetabolic thoracic lymph nodes.  -Linear metabolic activity in the left breast lumpectomy site as well as linear activity along the chest wall is likely post surgical/post treatment.   No pleural effusion. Small hiatal hernia.   Abdomen / Pelvis:  No abnormal abdominal or pelvic FDG activity.   No hydronephrosis. No bowel obstruction. No ascites. Scattered diverticuli. No AAA.   Musculoskeletal:  No aggressive lesions. No abnormal osseous FDG activity.  Procedure Note  Meliton Rattan, MD - 12/07/2020   Formatting of this note might be different from the original.  PROCEDURE: F-18 FDG PET/CT scan from the skull base to the mid thighs.   INDICATION: Solitary pulmonary nodule  initial evaluation and treatment strategy.     ELIGIBLE FOR AVAILABLE RESEARCH PROTOCOL: no  ASSESSMENT: 86 y.o. [employer], [place] woman status post left breast upper outer quadrant biopsy 06/22/2019 for a clinical T1b N0, stage IB invasive ductal carcinoma, grade 3, triple negative, with an MIB-1-1 of 30%.  (1) status post left lumpectomy and sentinel lymph node sampling 07/27/2019 for a pT1b pN0, stage IB invasive ductal carcinoma, with clear margins.  (a) 2 left axillary lymph nodes were removed  (2) no adjuvant chemotherapy planned  (3) adjuvant radiation 09/07/2019 through 10/06/2019 Site Technique Total Dose (Gy) Dose per Fx (Gy) Completed Fx Beam Energies  Breast, Left: Breast_Lt 3D 40.05/40.05 2.67 15/15 6X, 10X  Breast, Left: Breast_Lt_Bst specialPort 10/10 2 5/5 15E   (4) CT of the chest 11/14/2020 showed a new (as compared to July 2020) right upper lobe nodule measuring 0.9 cm, with no other findings of concern  (a) PET scan 12/07/2020 find the right upper lobe nodule in question to be hypermetabolic, with no other hypermetabolic areas elsewhere in the chest or abdomen  PLAN: Gertude did very well with her breast cancer surgery and treatment and is currently a year and a half out from definitive surgery.  She now has a new right upper lobe nodule, measuring approximately one third of an inch.  This could be a new primary lung tumor, or it could be a breast metastasis or it could be something else.  When we see metastases from breast cancer to the lung we usually see multiple lesions.  She does have many other spots but they were stable as compared to the scan a year and a half ago and they were not hypermetabolic (although they are subcentimeter lesions).  The patient is scheduled to meet with  pulmonary tomorrow.  From the location of this tumor it may be difficult to biopsy transbronchially.  I think the options may be that versus open biopsy and resection, which may be curative if we are dealing with a primary lung cancer, or simply doing radiation to this lesion and following.  The patient is 86 years old and certainly that also will need to be taken into account  I will be interested in Dr. Agustina Caroli opinion after he sees her tomorrow.  I obtained multiple tumor markers today and they may give [place] a hint as to what is actually going on.  I will have a virtual visit with her next week to catch up and firm up any decisions regarding evaluation and treatment  Total encounter time 25 minutes.Chauncey Cruel, MD   12/20/2020 4:18 PM Medical Oncology and Hematology [facility] [place], [place] [address] Tel. [phone]    Fax. [phone]   This document serves as a record of services personally performed by Lurline Del, MD. It was created on his behalf by Wilburn Mylar, a trained medical scribe. The creation of this record is based on the scribe's personal observations and the provider's statements to them.   I, Lurline Del MD, have reviewed the above documentation for accuracy and completeness, and I agree with the above.   *Total Encounter Time as defined by the Centers for Medicare and Medicaid Services includes, in addition to the face-to-face time of a patient visit (documented in the note above) non-face-to-face time: obtaining and reviewing outside history, ordering and reviewing medications, tests or procedures, care coordination (communications with other health care professionals or caregivers) and documentation in the medical record.

## 2020-12-20 ENCOUNTER — Other Ambulatory Visit: Payer: Self-pay

## 2020-12-20 ENCOUNTER — Inpatient Hospital Stay: Payer: Medicare HMO | Attending: Oncology | Admitting: Oncology

## 2020-12-20 ENCOUNTER — Inpatient Hospital Stay: Payer: Medicare HMO

## 2020-12-20 VITALS — BP 174/71 | HR 57 | Temp 97.4°F | Resp 18 | Ht 64.0 in | Wt 219.9 lb

## 2020-12-20 DIAGNOSIS — M1990 Unspecified osteoarthritis, unspecified site: Secondary | ICD-10-CM | POA: Insufficient documentation

## 2020-12-20 DIAGNOSIS — C50412 Malignant neoplasm of upper-outer quadrant of left female breast: Secondary | ICD-10-CM

## 2020-12-20 DIAGNOSIS — C7801 Secondary malignant neoplasm of right lung: Secondary | ICD-10-CM | POA: Diagnosis not present

## 2020-12-20 DIAGNOSIS — Z171 Estrogen receptor negative status [ER-]: Secondary | ICD-10-CM | POA: Diagnosis not present

## 2020-12-20 DIAGNOSIS — I10 Essential (primary) hypertension: Secondary | ICD-10-CM | POA: Diagnosis not present

## 2020-12-20 DIAGNOSIS — Z923 Personal history of irradiation: Secondary | ICD-10-CM | POA: Insufficient documentation

## 2020-12-20 DIAGNOSIS — Z79899 Other long term (current) drug therapy: Secondary | ICD-10-CM | POA: Diagnosis not present

## 2020-12-20 LAB — CBC WITH DIFFERENTIAL/PLATELET
Abs Immature Granulocytes: 0.03 10*3/uL (ref 0.00–0.07)
Basophils Absolute: 0 10*3/uL (ref 0.0–0.1)
Basophils Relative: 0 %
Eosinophils Absolute: 0.5 10*3/uL (ref 0.0–0.5)
Eosinophils Relative: 4 %
HCT: 33.6 % — ABNORMAL LOW (ref 36.0–46.0)
Hemoglobin: 11.1 g/dL — ABNORMAL LOW (ref 12.0–15.0)
Immature Granulocytes: 0 %
Lymphocytes Relative: 24 %
Lymphs Abs: 2.5 10*3/uL (ref 0.7–4.0)
MCH: 31.7 pg (ref 26.0–34.0)
MCHC: 33 g/dL (ref 30.0–36.0)
MCV: 96 fL (ref 80.0–100.0)
Monocytes Absolute: 0.7 10*3/uL (ref 0.1–1.0)
Monocytes Relative: 7 %
Neutro Abs: 6.8 10*3/uL (ref 1.7–7.7)
Neutrophils Relative %: 65 %
Platelets: 292 10*3/uL (ref 150–400)
RBC: 3.5 MIL/uL — ABNORMAL LOW (ref 3.87–5.11)
RDW: 13.1 % (ref 11.5–15.5)
WBC: 10.5 10*3/uL (ref 4.0–10.5)
nRBC: 0 % (ref 0.0–0.2)

## 2020-12-20 LAB — COMPREHENSIVE METABOLIC PANEL
ALT: 14 U/L (ref 0–44)
AST: 15 U/L (ref 15–41)
Albumin: 3.9 g/dL (ref 3.5–5.0)
Alkaline Phosphatase: 83 U/L (ref 38–126)
Anion gap: 8 (ref 5–15)
BUN: 34 mg/dL — ABNORMAL HIGH (ref 8–23)
CO2: 24 mmol/L (ref 22–32)
Calcium: 8.9 mg/dL (ref 8.9–10.3)
Chloride: 104 mmol/L (ref 98–111)
Creatinine, Ser: 0.88 mg/dL (ref 0.44–1.00)
GFR, Estimated: >60 mL/min (ref >60)
Glucose, Bld: 164 mg/dL — ABNORMAL HIGH (ref 70–99)
Potassium: 3.7 mmol/L (ref 3.5–5.1)
Sodium: 136 mmol/L (ref 135–145)
Total Bilirubin: 0.2 mg/dL — ABNORMAL LOW (ref 0.3–1.2)
Total Protein: 6.6 g/dL (ref 6.5–8.1)

## 2020-12-21 ENCOUNTER — Ambulatory Visit: Payer: Medicare HMO | Admitting: Emergency Medicine

## 2020-12-21 ENCOUNTER — Telehealth: Payer: Self-pay | Admitting: Emergency Medicine

## 2020-12-21 ENCOUNTER — Encounter: Payer: Self-pay | Admitting: Emergency Medicine

## 2020-12-21 VITALS — BP 134/80 | HR 56 | Ht 64.0 in | Wt 220.0 lb

## 2020-12-21 DIAGNOSIS — R911 Solitary pulmonary nodule: Secondary | ICD-10-CM | POA: Diagnosis not present

## 2020-12-21 LAB — CEA (IN HOUSE-CHCC): CEA (CHCC-In House): 1.24 ng/mL (ref 0.00–5.00)

## 2020-12-21 LAB — CANCER ANTIGEN 27.29: CA 27.29: 15.8 U/mL (ref 0.0–38.6)

## 2020-12-21 NOTE — Assessment & Plan Note (Signed)
Most of her nodules are stable but there is a new 9 mm rounded nodule in the right upper lobe.  Per report on [facility] PET scan this is hypermetabolic without any evidence of other hypermetabolism.  Suspicious for possible metastatic disease.  If she did not have a history of breast cancer we could consider primary resection but given that diagnosis I think she needs a tissue biopsy.  Discussed this with her today.  I recommended navigational bronchoscopy under general anesthesia.  We would like to do this next week if possible.  Will work on arranging.

## 2020-12-21 NOTE — Progress Notes (Signed)
Subjective:    Patient ID: Alisha [name], female    DOB: 06-09-1935, 86 y.o.   MRN: [id number]  HPI 86 year old never smoker with a history of breast cancer treated with lumpectomy and then radiation followed by Dr. Jana Hakim.  Also with a history of arthritis, asthma dx 50 yrs ago. She is referred today for evaluation abnormal CT scan of the chest.  She has not required pred for her asthma in over 15 yrs. She is exercising and is able to exert. She is walking 30 min a day.   She has pulmonary nodules that have been followed with serial CT scans.  I reviewed the scans.  There were multiple small solid and groundglass pulmonary nodules largest 6 mm seen on a CT chest from 03/08/2019 that were stable in size and appearance compared with a scan 09/13/2018.  The most recent scan was 11/14/2020 which again showed multiple predominantly right-sided nodules that are unchanged from prior.  There is a new 9 mm round central right upper lobe pulmonary nodule not seen on the priors.  A PET scan was done at [facility] on 12/07/2020 and apparently showed that the new right upper lobe nodule was hypermetabolic, there was no other hypermetabolism noted including the other nodules and the mediastinal nodes.  I do not have this image available for review at this time.    Review of Systems As per HPI   Past Medical History:  Diagnosis Date  . Arthritis   . Asthma    Never smoker  . Blood transfusion    over 40 y ears  . Breast mass, left   . Cancer ([place])   . History of radiation therapy 09/07/19- 10/06/19   Left Breast 15 fractions of 2.67 Gy to total 40.05 Gy. Left breast boost 5 fractions of 2 Gy each to total 10 Gy  . Hypertension    on meds  . Hypothyroidism    on meds  . Pre-diabetes      Family History  Problem Relation Age of Onset  . Other Mother        died @ 50 - but was healthy  . Hypertension Sister   . Diabetes Sister   . Diabetes Brother        no longer on medications.   .  Anesthesia problems Neg Hx   . Hypotension Neg Hx   . Malignant hyperthermia Neg Hx   . Pseudochol deficiency Neg Hx      Social History   Socioeconomic History  . Marital status: Single    Spouse name: Not on file  . Number of children: Not on file  . Years of education: Not on file  . Highest education level: Not on file  Occupational History  . Not on file  Tobacco Use  . Smoking status: Never Smoker  . Smokeless tobacco: Never Used  Vaping Use  . Vaping Use: Never used  Substance and Sexual Activity  . Alcohol use: No  . Drug use: No  . Sexual activity: Not Currently  Other Topics Concern  . Not on file  Social History Narrative   No longer exercising - since "got tired" - previously did elliptical, exercise bike etc.    While working - used worked gym      Diet: Continues to work on her diet - enjoys vegetables, beans, chicken, fish, occasional homemade burger (1x/mo), salad - does not add salt to food      Drinks: 1 cup of coffee/day,  6 8-oz glasses of water per day       Exercise: Increased exercise to 30 minutes per day - using weights, exercise bike, and treadmill   Social Determinants of Health   Financial Resource Strain: Not on file  Food Insecurity: Not on file  Transportation Needs: Not on file  Physical Activity: Not on file  Stress: Not on file  Social Connections: Not on file  Intimate Partner Violence: Not on file    She worked in a [employer] for 27 yrs.  From [place] No other inhaled exposures.   Allergies  Allergen Reactions  . Levofloxacin     Other reaction(s): Unknown  . Lisinopril Itching    No energy  . Penicillins Itching and Rash    Has patient had a PCN reaction causing immediate rash, facial/tongue/throat swelling, SOB or lightheadedness with hypotension: No Has patient had a PCN reaction causing severe rash involving mucus membranes or skin necrosis: No Has patient had a PCN reaction that required hospitalization: No Has  patient had a PCN reaction occurring within the last 10 years: No If all of the above answers are "NO", then may proceed with Cephalosporin use.  . Sulfa Antibiotics Itching and Rash     Outpatient Medications Prior to Visit  Medication Sig Dispense Refill  . acetaminophen (TYLENOL) 500 MG tablet Take 1,000 mg by mouth every 6 (six) hours as needed for moderate pain.     Marland Kitchen albuterol (PROVENTIL HFA;VENTOLIN HFA) 108 (90 Base) MCG/ACT inhaler Inhale 1-2 puffs into the lungs every 6 (six) hours as needed for wheezing or shortness of breath.     Marland Kitchen amLODipine (NORVASC) 5 MG tablet Take 1 tablet (5 mg total) by mouth daily. 90 tablet 3  . Artificial Tear Solution (SOOTHE XP OP) Place 1 drop into both eyes 3 (three) times daily as needed (dry or irritated eyes).    . Biotin 5 MG TABS Take 5 mg by mouth daily.     . cetaphil (CETAPHIL) cream Apply 1 application topically 2 (two) times daily.     . chlorthalidone (HYGROTON) 25 MG tablet Take 1 tablet (25 mg total) by mouth daily. 90 tablet 3  . diphenhydrAMINE HCl 50 MG/30ML LIQD Take 50 mg by mouth at bedtime as needed (sleep).    . fluticasone (FLONASE) 50 MCG/ACT nasal spray Place 2 sprays into both nostrils daily as needed for allergies or rhinitis.    . furosemide (LASIX) 20 MG tablet Take 20 mg by mouth daily as needed for fluid or edema.    . gabapentin (NEURONTIN) 100 MG capsule Take 1 capsule (100 mg total) by mouth 3 (three) times daily. 90 capsule 2  . levothyroxine (SYNTHROID, LEVOTHROID) 88 MCG tablet Take 88 mcg by mouth daily before breakfast.     . meclizine (ANTIVERT) 25 MG tablet Take 12.5 mg by mouth 3 (three) times daily as needed for dizziness.    . Multiple Vitamins-Minerals (ICAPS AREDS 2 PO) Take 1 capsule by mouth 2 (two) times daily.     Marland Kitchen OVER THE COUNTER MEDICATION 300 mg in the morning and at bedtime. ADRENAL SUPPORT WITH ASHWAGANDHA    . OVER THE COUNTER MEDICATION Use as directed 304 mg in the mouth or throat in the  morning and at bedtime. CHYAVANPRISH HERBAL BLEND    . potassium chloride SA (KLOR-CON) 20 MEQ tablet Take 20 mEq by mouth daily as needed (when taking furosemide).    Marland Kitchen spironolactone (ALDACTONE) 25 MG tablet Take 0.5 tablets (  12.5 mg total) by mouth daily. 90 tablet 3  . valsartan (DIOVAN) 160 MG tablet Take 1 tablet (160 mg total) by mouth daily. 90 tablet 3  . FLUAD QUADRIVALENT 0.5 ML injection      No facility-administered medications prior to visit.         Objective:   Physical Exam Vitals:   12/21/20 1342  BP: 134/80  Pulse: (!) 56  SpO2: 97%  Weight: 220 lb (99.8 kg)  Height: 5\' 4"  (1.626 m)   Gen: Pleasant, overwt woman, in no distress,  normal affect  ENT: No lesions,  mouth clear,  oropharynx clear, no postnasal drip  Neck: No JVD, no stridor  Lungs: No use of accessory muscles, no crackles or wheezing on normal respiration, no wheeze on forced expiration  Cardiovascular: RRR, heart sounds normal, no murmur or gallops, no peripheral edema  Musculoskeletal: No deformities, no cyanosis or clubbing  Neuro: alert, awake, non focal  Skin: Warm, no lesions or rash     Assessment & Plan:  Pulmonary nodule Most of her nodules are stable but there is a new 9 mm rounded nodule in the right upper lobe.  Per report on [facility] PET scan this is hypermetabolic without any evidence of other hypermetabolism.  Suspicious for possible metastatic disease.  If she did not have a history of breast cancer we could consider primary resection but given that diagnosis I think she needs a tissue biopsy.  Discussed this with her today.  I recommended navigational bronchoscopy under general anesthesia.  We would like to do this next week if possible.  Will work on arranging.  Baltazar Apo, MD, PhD 12/21/2020, 2:24 PM [facility] [phone] or if no answer before 7:00PM call [phone] For any issues after 7:00PM please call eLink [phone]

## 2020-12-21 NOTE — Telephone Encounter (Signed)
Pt given appt info for the following: COVID Test 3/7 @ 1:40 [facility] - 3/10 @ 1:30 - [facility].  Requested CT from GI - had to LM to request this.

## 2020-12-21 NOTE — H&P (View-Only) (Signed)
Subjective:    Patient ID: Alisha [name], female    DOB: 12-14-1934, 86 y.o.   MRN: [id number]  HPI 86 year old never smoker with a history of breast cancer treated with lumpectomy and then radiation followed by Dr. Jana Hakim.  Also with a history of arthritis, asthma dx 50 yrs ago. She is referred today for evaluation abnormal CT scan of the chest.  She has not required pred for her asthma in over 15 yrs. She is exercising and is able to exert. She is walking 30 min a day.   She has pulmonary nodules that have been followed with serial CT scans.  I reviewed the scans.  There were multiple small solid and groundglass pulmonary nodules largest 6 mm seen on a CT chest from 03/08/2019 that were stable in size and appearance compared with a scan 09/13/2018.  The most recent scan was 11/14/2020 which again showed multiple predominantly right-sided nodules that are unchanged from prior.  There is a new 9 mm round central right upper lobe pulmonary nodule not seen on the priors.  A PET scan was done at [facility] on 12/07/2020 and apparently showed that the new right upper lobe nodule was hypermetabolic, there was no other hypermetabolism noted including the other nodules and the mediastinal nodes.  I do not have this image available for review at this time.    Review of Systems As per HPI   Past Medical History:  Diagnosis Date  . Arthritis   . Asthma    Never smoker  . Blood transfusion    over 40 y ears  . Breast mass, left   . Cancer ([place])   . History of radiation therapy 09/07/19- 10/06/19   Left Breast 15 fractions of 2.67 Gy to total 40.05 Gy. Left breast boost 5 fractions of 2 Gy each to total 10 Gy  . Hypertension    on meds  . Hypothyroidism    on meds  . Pre-diabetes      Family History  Problem Relation Age of Onset  . Other Mother        died @ 36 - but was healthy  . Hypertension Sister   . Diabetes Sister   . Diabetes Brother        no longer on medications.   .  Anesthesia problems Neg Hx   . Hypotension Neg Hx   . Malignant hyperthermia Neg Hx   . Pseudochol deficiency Neg Hx      Social History   Socioeconomic History  . Marital status: Single    Spouse name: Not on file  . Number of children: Not on file  . Years of education: Not on file  . Highest education level: Not on file  Occupational History  . Not on file  Tobacco Use  . Smoking status: Never Smoker  . Smokeless tobacco: Never Used  Vaping Use  . Vaping Use: Never used  Substance and Sexual Activity  . Alcohol use: No  . Drug use: No  . Sexual activity: Not Currently  Other Topics Concern  . Not on file  Social History Narrative   No longer exercising - since "got tired" - previously did elliptical, exercise bike etc.    While working - used worked gym      Diet: Continues to work on her diet - enjoys vegetables, beans, chicken, fish, occasional homemade burger (1x/mo), salad - does not add salt to food      Drinks: 1 cup of coffee/day,  6 8-oz glasses of water per day       Exercise: Increased exercise to 30 minutes per day - using weights, exercise bike, and treadmill   Social Determinants of Health   Financial Resource Strain: Not on file  Food Insecurity: Not on file  Transportation Needs: Not on file  Physical Activity: Not on file  Stress: Not on file  Social Connections: Not on file  Intimate Partner Violence: Not on file    She worked in a [employer] lab for 27 yrs.  From [place] No other inhaled exposures.   Allergies  Allergen Reactions  . Levofloxacin     Other reaction(s): Unknown  . Lisinopril Itching    No energy  . Penicillins Itching and Rash    Has patient had a PCN reaction causing immediate rash, facial/tongue/throat swelling, SOB or lightheadedness with hypotension: No Has patient had a PCN reaction causing severe rash involving mucus membranes or skin necrosis: No Has patient had a PCN reaction that required hospitalization: No Has  patient had a PCN reaction occurring within the last 10 years: No If all of the above answers are "NO", then may proceed with Cephalosporin use.  . Sulfa Antibiotics Itching and Rash     Outpatient Medications Prior to Visit  Medication Sig Dispense Refill  . acetaminophen (TYLENOL) 500 MG tablet Take 1,000 mg by mouth every 6 (six) hours as needed for moderate pain.     Marland Kitchen albuterol (PROVENTIL HFA;VENTOLIN HFA) 108 (90 Base) MCG/ACT inhaler Inhale 1-2 puffs into the lungs every 6 (six) hours as needed for wheezing or shortness of breath.     Marland Kitchen amLODipine (NORVASC) 5 MG tablet Take 1 tablet (5 mg total) by mouth daily. 90 tablet 3  . Artificial Tear Solution (SOOTHE XP OP) Place 1 drop into both eyes 3 (three) times daily as needed (dry or irritated eyes).    . Biotin 5 MG TABS Take 5 mg by mouth daily.     . cetaphil (CETAPHIL) cream Apply 1 application topically 2 (two) times daily.     . chlorthalidone (HYGROTON) 25 MG tablet Take 1 tablet (25 mg total) by mouth daily. 90 tablet 3  . diphenhydrAMINE HCl 50 MG/30ML LIQD Take 50 mg by mouth at bedtime as needed (sleep).    . fluticasone (FLONASE) 50 MCG/ACT nasal spray Place 2 sprays into both nostrils daily as needed for allergies or rhinitis.    . furosemide (LASIX) 20 MG tablet Take 20 mg by mouth daily as needed for fluid or edema.    . gabapentin (NEURONTIN) 100 MG capsule Take 1 capsule (100 mg total) by mouth 3 (three) times daily. 90 capsule 2  . levothyroxine (SYNTHROID, LEVOTHROID) 88 MCG tablet Take 88 mcg by mouth daily before breakfast.     . meclizine (ANTIVERT) 25 MG tablet Take 12.5 mg by mouth 3 (three) times daily as needed for dizziness.    . Multiple Vitamins-Minerals (ICAPS AREDS 2 PO) Take 1 capsule by mouth 2 (two) times daily.     Marland Kitchen OVER THE COUNTER MEDICATION 300 mg in the morning and at bedtime. ADRENAL SUPPORT WITH ASHWAGANDHA    . OVER THE COUNTER MEDICATION Use as directed 304 mg in the mouth or throat in the  morning and at bedtime. CHYAVANPRISH HERBAL BLEND    . potassium chloride SA (KLOR-CON) 20 MEQ tablet Take 20 mEq by mouth daily as needed (when taking furosemide).    Marland Kitchen spironolactone (ALDACTONE) 25 MG tablet Take 0.5 tablets (  12.5 mg total) by mouth daily. 90 tablet 3  . valsartan (DIOVAN) 160 MG tablet Take 1 tablet (160 mg total) by mouth daily. 90 tablet 3  . FLUAD QUADRIVALENT 0.5 ML injection      No facility-administered medications prior to visit.         Objective:   Physical Exam Vitals:   12/21/20 1342  BP: 134/80  Pulse: (!) 56  SpO2: 97%  Weight: 220 lb (99.8 kg)  Height: 5\' 4"  (1.626 m)   Gen: Pleasant, overwt woman, in no distress,  normal affect  ENT: No lesions,  mouth clear,  oropharynx clear, no postnasal drip  Neck: No JVD, no stridor  Lungs: No use of accessory muscles, no crackles or wheezing on normal respiration, no wheeze on forced expiration  Cardiovascular: RRR, heart sounds normal, no murmur or gallops, no peripheral edema  Musculoskeletal: No deformities, no cyanosis or clubbing  Neuro: alert, awake, non focal  Skin: Warm, no lesions or rash     Assessment & Plan:  Pulmonary nodule Most of her nodules are stable but there is a new 9 mm rounded nodule in the right upper lobe.  Per report on [facility] PET scan this is hypermetabolic without any evidence of other hypermetabolism.  Suspicious for possible metastatic disease.  If she did not have a history of breast cancer we could consider primary resection but given that diagnosis I think she needs a tissue biopsy.  Discussed this with her today.  I recommended navigational bronchoscopy under general anesthesia.  We would like to do this next week if possible.  Will work on arranging.  Baltazar Apo, MD, PhD 12/21/2020, 2:24 PM [facility] [phone] or if no answer before 7:00PM call [phone] For any issues after 7:00PM please call eLink [phone]

## 2020-12-21 NOTE — Patient Instructions (Signed)
Dr. Lamonte Sakai will obtain copies of your PET scan from [facility] We will arrange for bronchoscopy next week, possibly 3/10 or 3/9 to evaluate your nodule further.  You will need a designated driver because the procedure will be done under anesthesia.  You will be contacted with the final details about timing and any medication instructions. Follow with Dr Lamonte Sakai in 1 month

## 2020-12-24 ENCOUNTER — Other Ambulatory Visit (HOSPITAL_COMMUNITY)
Admission: RE | Admit: 2020-12-24 | Discharge: 2020-12-24 | Disposition: A | Discharge status: Home / Self Care | Payer: Medicare HMO | Source: Ambulatory Visit | Attending: Emergency Medicine | Admitting: Emergency Medicine

## 2020-12-24 DIAGNOSIS — Z01812 Encounter for preprocedural laboratory examination: Secondary | ICD-10-CM | POA: Diagnosis not present

## 2020-12-24 DIAGNOSIS — Z20822 Contact with and (suspected) exposure to covid-19: Secondary | ICD-10-CM | POA: Diagnosis not present

## 2020-12-24 LAB — SARS CORONAVIRUS 2 (TAT 6-24 HRS): SARS Coronavirus 2: NEGATIVE

## 2020-12-24 NOTE — Telephone Encounter (Signed)
Per [facility] w/ [facility], [address], they have burned the disk & we should receive it 12/25/20.

## 2020-12-25 ENCOUNTER — Telehealth: Payer: Self-pay

## 2020-12-25 NOTE — Telephone Encounter (Signed)
Returned call to pt to verify pt's telephone appointment for tomorrow. Pt acknowledged 12/26/20 02:45 pm phone visit.

## 2020-12-26 ENCOUNTER — Telehealth: Payer: Self-pay | Admitting: Emergency Medicine

## 2020-12-26 ENCOUNTER — Encounter (HOSPITAL_COMMUNITY): Payer: Self-pay | Admitting: Emergency Medicine

## 2020-12-26 ENCOUNTER — Inpatient Hospital Stay (HOSPITAL_BASED_OUTPATIENT_CLINIC_OR_DEPARTMENT_OTHER): Payer: Medicare HMO | Admitting: Oncology

## 2020-12-26 DIAGNOSIS — Z171 Estrogen receptor negative status [ER-]: Secondary | ICD-10-CM | POA: Diagnosis not present

## 2020-12-26 DIAGNOSIS — C50412 Malignant neoplasm of upper-outer quadrant of left female breast: Secondary | ICD-10-CM | POA: Diagnosis not present

## 2020-12-26 NOTE — Progress Notes (Signed)
[place]  [phone] Fax:([phone]) [phone]     ID: Alisha [name] DOB: November 14, 1934  MR#: [id number]  HBZ#:[id number]  Patient Care Team: [name], MD as PCP - General (Family Medicine) Erroll Luna, MD as Consulting Physician (General Surgery) Eppie Gibson, MD as Attending Physician (Radiation Oncology) Leonie Man, MD as Consulting Physician (Cardiology) Kaisa Wofford, Virgie Dad, MD as Consulting Physician (Oncology) Collene Gobble, MD as Consulting Physician (Pulmonary Disease) Chauncey Cruel, MD OTHER MD:  I connected with Alisha [name] on 12/26/20 at  2:45 PM EST by telephone visit and verified that I am speaking with the correct person using two identifiers.   I discussed the limitations, risks, security and privacy concerns of performing an evaluation and management service by telemedicine and the availability of in-person appointments. I also discussed with the patient that there may be a patient responsible charge related to this service. The patient expressed understanding and agreed to proceed.   Other persons participating in the visit and their role in the encounter: None  Patient's location: Home Provider's location: [place]  Total time spent: 15 min   CHIEF COMPLAINT: triple negative breast cancer  CURRENT TREATMENT: Observation   INTERVAL HISTORY: Alisha [name] was contacted today for follow up of her triple negative breast cancer.   She is scheduled for bronchoscopy tomorrow, 12/27/2020, under Dr. Lamonte Sakai.   REVIEW OF SYSTEMS: Alisha [name] tells me there has been no change in her functional status and no new symptoms since the last visit here.   COVID 19 VACCINATION STATUS: infection 11/2019; [place] x2, most recently 01/2020   HISTORY OF CURRENT ILLNESS: From the original intake note:  Alisha [name] was noted to have a left breast upper-outer quadrant distortion on screening mammogram in 2017.  She proceeded to biopsy ([id number]), which showed a complex sclerosing lesion. She opted to undergo left lumpectomy ([id number]) on 08/11/2016 under Dr. Brantley Stage, which revealed a radial scar with calcifications.   She presented for routine screening mammogram, which showed a possible mass and adjacent calcifications in the left breast. She was referred to [facility] for further imaging on 06/16/2019. Physical exam performed that day showed a palpable soft thickening in the upper central aspect of the left breast. She underwent left diagnostic mammography with tomography and ultrasonography showing: breast density category B; suspicious 7 mm mass in the 12 o'clock location of the left breast; small indeterminate group of calcifications 4 mm anterior to the mass warrant excision if the mass is positive for malignancy; left axilla negative for adenopathy.  Accordingly on 06/22/2019 she proceeded to biopsy of the left breast area in question. The pathology from this procedure ([id number]) showed: invasive ductal carcinoma, grade 3. Prognostic indicators significant for: estrogen receptor, 0% negative and progesterone receptor, 0% negative. Proliferation marker Ki67 at 30%. HER2 negative by immunohistochemistry (0).  The patient's subsequent history is as detailed below.   PAST MEDICAL HISTORY: Past Medical History:  Diagnosis Date  . Arthritis   . Asthma    Never smoker  . Blood transfusion    over 40 y ears  . Breast mass, left   . Cancer ([place])   . History of radiation therapy 09/07/19- 10/06/19   Left Breast 15 fractions of 2.67 Gy to total 40.05 Gy. Left breast boost 5 fractions of 2 Gy each to total 10 Gy  . Hypertension    on meds  . Hypothyroidism    on meds  . Pre-diabetes  PAST SURGICAL HISTORY: Past Surgical History:  Procedure Laterality Date  . ABDOMINAL HYSTERECTOMY     32 yrs  . BREAST LUMPECTOMY WITH RADIOACTIVE SEED AND SENTINEL LYMPH NODE BIOPSY Left 07/27/2019    Procedure: LEFT BREAST LUMPECTOMY WITH RADIOACTIVE SEED AND LEFT SENTINEL LYMPH NODE MAPPING;  Surgeon: Erroll Luna, MD;  Location: [facility];  Service: General;  Laterality: Left;  . BREAST LUMPECTOMY WITH RADIOACTIVE SEED LOCALIZATION Left 08/11/2016   Procedure: LEFT BREAST LUMPECTOMY WITH RADIOACTIVE SEED LOCALIZATION;  Surgeon: Erroll Luna, MD;  Location: [facility];  Service: General;  Laterality: Left;  . CATARACT EXTRACTION, BILATERAL    . CORONARY CALCIUM SCORE-CT ANGIOGRAM  04/2019   Small, < 99m nodule noted.  Difficult study to read.  Coronary calcium score was 11.  Significant motion artifact, but visible territory showed minimal disease.  Minimal calcium noted in the ostial LAD but no suggestion of significant CAD.  .Marland KitchenEYE SURGERY    . JOINT REPLACEMENT Bilateral    left knee 2012  . KNEE ARTHROPLASTY  02/17/2012   Procedure: COMPUTER ASSISTED TOTAL KNEE ARTHROPLASTY;  Surgeon: GMeredith Pel MD;  [place]  Service: Orthopedics;  Laterality: Right;  Right total knee replacement  . TRANSTHORACIC ECHOCARDIOGRAM  04/02/2020   Normal LV function.  No R WMA.  Moderate LVH.  GRII DD with elevated filling pressures.  Moderate LA dilation with trivial MR.  Mild aortic valve sclerosis.    FAMILY HISTORY: Family History  Problem Relation Age of Onset  . Other Mother        died @ 84- but was healthy  . Hypertension Sister   . Diabetes Sister   . Diabetes Brother        no longer on medications.   . Anesthesia problems Neg Hx   . Hypotension Neg Hx   . Malignant hyperthermia Neg Hx   . Pseudochol deficiency Neg Hx   Patient's father was in his mid 882 years old when he died from unknown causes. Patient's mother died from "natural causes" at age 84 The patient denies a family hx of breast or ovarian cancer. She has 5 brothers and 1 sister.   GYNECOLOGIC HISTORY:  No LMP recorded. Patient is postmenopausal. 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  She describes herself a single.  Her daughter [place]  She has multiple medical problems.  The patient's son died at age 84  The patient has 1 grandchild, [place]Johnny [name] [employer]  He has 4 children.  The patient herself attends a local n[place]DIRECTIVES: The patient intends to name her grandson HJohnny Bridgeas her healthcare power of attorney.  He can be reached at [phone]  She was given the appropriate documents to complete and notarize at her discretion at the time of her 07/21/2019 visit   HEALTH MAINTENANCE: Social History   Tobacco Use  . Smoking status: Never Smoker  . Smokeless tobacco: Never Used  Vaping Use  . Vaping Use: Never used  Substance Use Topics  . Alcohol use: No  . Drug use: No  Allergies  Allergen Reactions  . Levofloxacin     Other reaction(s): Unknown  . Lisinopril Itching    No energy  . Penicillins Itching and Rash    Has patient had a PCN reaction causing immediate rash, facial/tongue/throat swelling, SOB or lightheadedness with hypotension: No Has patient had a PCN reaction causing severe rash involving mucus membranes or skin necrosis: No Has patient had a PCN reaction that required hospitalization: No Has patient had a PCN reaction occurring within the last 10 years: No If all of the above answers are "NO", then may proceed with Cephalosporin use.  . Sulfa Antibiotics Itching and Rash    Current Outpatient Medications  Medication Sig Dispense Refill  . acetaminophen (TYLENOL) 500 MG tablet Take 1,000 mg by mouth every 6 (six) hours as needed  (pain).    Marland Kitchen albuterol (PROVENTIL HFA;VENTOLIN HFA) 108 (90 Base) MCG/ACT inhaler Inhale 1-2 puffs into the lungs every 6 (six) hours as needed for wheezing or shortness of breath.     Marland Kitchen amLODipine (NORVASC) 5 MG tablet Take 1 tablet (5 mg total) by mouth daily. 90 tablet 3  . Artificial Tear Solution (SOOTHE XP OP) Place 1 drop into both eyes in the morning and at bedtime.    . Biotin 5 MG TABS Take 5 mg by mouth daily at 12 noon.    . cetaphil (CETAPHIL) cream Apply 1 application topically as needed (dry skin).    . chlorthalidone (HYGROTON) 25 MG tablet Take 1 tablet (25 mg total) by mouth daily. 90 tablet 3  . diphenhydrAMINE HCl 50 MG/30ML LIQD Take 50 mg by mouth at bedtime as needed (sleep).    . fluticasone (FLONASE) 50 MCG/ACT nasal spray Place 2 sprays into both nostrils daily.    . furosemide (LASIX) 20 MG tablet Take 20 mg by mouth daily as needed for fluid or edema.    . gabapentin (NEURONTIN) 100 MG capsule Take 1 capsule (100 mg total) by mouth 3 (three) times daily. (Patient taking differently: Take 100 mg by mouth at bedtime.) 90 capsule 2  . levothyroxine (SYNTHROID, LEVOTHROID) 88 MCG tablet Take 88 mcg by mouth daily before breakfast.     . meclizine (ANTIVERT) 25 MG tablet Take 12.5 mg by mouth 3 (three) times daily as needed for dizziness.    . Multiple Vitamins-Minerals (ICAPS AREDS 2 PO) Take 1 capsule by mouth 2 (two) times daily.     Marland Kitchen OVER THE COUNTER MEDICATION Take 300 mg by mouth daily at 12 noon. ADRENAL SUPPORT WITH ASHWAGANDHA    . potassium chloride SA (KLOR-CON) 20 MEQ tablet Take 20 mEq by mouth daily as needed (when taking furosemide).    Marland Kitchen spironolactone (ALDACTONE) 25 MG tablet Take 0.5 tablets (12.5 mg total) by mouth daily. 90 tablet 3  . valsartan (DIOVAN) 160 MG tablet Take 1 tablet (160 mg total) by mouth daily. 90 tablet 3   No current facility-administered medications for this visit.    OBJECTIVE:   There were no vitals filed for this visit.    There is no height or weight on file to calculate BMI.   Wt Readings from Last 3 Encounters:  12/21/20 220 lb (99.8 kg)  12/20/20 219 lb 14.4 oz (99.7 kg)  11/12/20 222 lb 6.4 oz (100.9 kg)      ECOG FS:1 - Symptomatic but completely ambulatory  Telemedicine visit 12/26/2020   LAB RESULTS:  CMP     Component Value Date/Time   NA 136 12/20/2020 1456  NA 139 12/13/2020 1035   K 3.7 12/20/2020 1456   CL 104 12/20/2020 1456   CO2 24 12/20/2020 1456   GLUCOSE 164 (H) 12/20/2020 1456   BUN 34 (H) 12/20/2020 1456   BUN 25 12/13/2020 1035   CREATININE 0.88 12/20/2020 1456   CREATININE 0.78 07/21/2019 1525   CALCIUM 8.9 12/20/2020 1456   PROT 6.6 12/20/2020 1456   ALBUMIN 3.9 12/20/2020 1456   AST 15 12/20/2020 1456   AST 15 07/21/2019 1525   ALT 14 12/20/2020 1456   ALT 19 07/21/2019 1525   ALKPHOS 83 12/20/2020 1456   BILITOT 0.2 (L) 12/20/2020 1456   BILITOT 0.2 (L) 07/21/2019 1525   GFRNONAA >60 12/20/2020 1456   GFRNONAA >60 07/21/2019 1525   GFRAA 76 12/13/2020 1035   GFRAA >60 07/21/2019 1525    No results found for: TOTALPROTELP, ALBUMINELP, A1GS, A2GS, BETS, BETA2SER, GAMS, MSPIKE, SPEI  No results found for: KPAFRELGTCHN, LAMBDASER, KAPLAMBRATIO  Lab Results  Component Value Date   WBC 10.5 12/20/2020   NEUTROABS 6.8 12/20/2020   HGB 11.1 (L) 12/20/2020   HCT 33.6 (L) 12/20/2020   MCV 96.0 12/20/2020   PLT 292 12/20/2020   No results found for: LABCA2  No components found for: TMAUQJ335  No results for input(s): INR in the last 168 hours.  No results found for: LABCA2  No results found for: KTG256  No results found for: CAN125  No results found for: LSL373  Lab Results  Component Value Date   CA2729 15.8 12/20/2020    No components found for: HGQUANT  Lab Results  Component Value Date   CEA1 1.24 12/20/2020   /  CEA ([facility])  Date Value Ref Range Status  12/20/2020 1.24 0.00 - 5.00 ng/mL Final    Comment:    (NOTE) This test  was performed using Architect's Chemiluminescent Microparticle Immunoassay. Values obtained from different assay methods cannot be used interchangeably. Please note that 5-10% of patients who smoke may see CEA levels up to 6.9 ng/mL. Performed at [facility], [place] [address] [place], [place] [address]      No results found for: AFPTUMOR  No results found for: CHROMOGRNA  No results found for: HGBA, HGBA2QUANT, HGBFQUANT, HGBSQUAN (Hemoglobinopathy evaluation)   No results found for: LDH  No results found for: IRON, TIBC, IRONPCTSAT (Iron and TIBC)  No results found for: FERRITIN  Urinalysis    Component Value Date/Time   COLORURINE YELLOW 07/02/2018 [place] 07/02/2018 1752   LABSPEC 1.021 07/02/2018 1752   PHURINE 5.0 07/02/2018 1752   GLUCOSEU NEGATIVE 07/02/2018 1752   HGBUR NEGATIVE 07/02/2018 1752   BILIRUBINUR NEGATIVE 07/02/2018 1752   KETONESUR 20 (A) 07/02/2018 1752   PROTEINUR NEGATIVE 07/02/2018 1752   UROBILINOGEN 1.0 02/20/2012 0927   NITRITE NEGATIVE 07/02/2018 1752   LEUKOCYTESUR NEGATIVE 07/02/2018 1752    STUDIES: No results found.   ELIGIBLE FOR AVAILABLE RESEARCH PROTOCOL: no  ASSESSMENT: 86 y.o. [employer], [place] woman status post left breast upper outer quadrant biopsy 06/22/2019 for a clinical T1b N0, stage IB invasive ductal carcinoma, grade 3, triple negative, with an MIB-1-1 of 30%.  (1) status post left lumpectomy and sentinel lymph node sampling 07/27/2019 for a pT1b pN0, stage IB invasive ductal carcinoma, with clear margins.  (a) 2 left axillary lymph nodes were removed  (2) no adjuvant chemotherapy planned  (3) adjuvant radiation 09/07/2019 through 10/06/2019 Site Technique Total Dose (Gy) Dose per Fx (Gy)  Completed Fx Beam Energies  Breast, Left: Breast_Lt 3D 40.05/40.05 2.67 15/15 6X, 10X  Breast, Left: Breast_Lt_Bst specialPort 10/10 2 5/5 15E   (4) CT of the chest 11/14/2020 showed a  new (as compared to July 2020) right upper lobe nodule measuring 0.9 cm, with no other findings of concern  (a) PET scan 12/07/2020 find the right upper lobe nodule in question to be hypermetabolic, with no other hypermetabolic areas elsewhere in the chest or abdomen  (b) CA 27-29 and CEA obtained 12/20/2020 were in the normal range  PLAN: Archana has no symptoms related to her lung mass.  We discussed the fact that her tumor markers are not elevated.  This of course does not mean that she does not have cancer.  It does mean that those are not useful tests.    She had no questions regarding the upcoming procedure tomorrow and understands the possible complications from bronchoscopy, which I hope she will not have.  Anticipate we will have results from that test sometime late next week and I will have another virtual visit with her at that time.      Chauncey Cruel, MD   12/26/2020 5:11 PM [facility] [place], [place] [address] Tel. [phone]    Fax. [phone]   This document serves as a record of services personally performed by Lurline Del, MD. It was created on his behalf by Wilburn Mylar, a trained medical scribe. The creation of this record is based on the scribe's personal observations and the provider's statements to them.   I, Lurline Del MD, have reviewed the above documentation for accuracy and completeness, and I agree with the above.   *Total Encounter Time as defined by the Centers for Medicare and Medicaid Services includes, in addition to the face-to-face time of a patient visit (documented in the note above) non-face-to-face time: obtaining and reviewing outside history, ordering and reviewing medications, tests or procedures, care coordination (communications with other health care professionals or caregivers) and documentation in the medical record.

## 2020-12-26 NOTE — Progress Notes (Signed)
EKG: 11/12/20 CXR: 09/05/20 ECHO: 04/02/20 Stress Test: denies Cardiac Cath: denies  Fasting Blood Sugar- na Checks Blood Sugar__na_ times a day Patient is pre-diabetic  OSA/CPAP:  No  ASA/Blood Thinners:  No  Covid test 12/25/19 negative  Anesthesia Review: no  Patient denies shortness of breath, fever, cough, and chest pain at PAT appointment.  Patient verbalized understanding of instructions provided today at the PAT appointment.  Patient asked to review instructions at home and day of surgery.

## 2020-12-26 NOTE — Telephone Encounter (Signed)
Super D CT disc received as well as PET scan disc from [facility].  Both discs have been placed on Dr. Agustina Caroli desk at the keyboard. Routing to Dr. Lamonte Sakai as an Juluis Rainier as pt is scheduled for a bronch tomorrow 3/10.  Nothing further needed.Marland Kitchen

## 2020-12-27 ENCOUNTER — Encounter (HOSPITAL_COMMUNITY)
Admission: RE | Disposition: A | Discharge status: Home / Self Care | Payer: Self-pay | Source: Home / Self Care | Attending: Emergency Medicine

## 2020-12-27 ENCOUNTER — Other Ambulatory Visit: Payer: Self-pay

## 2020-12-27 ENCOUNTER — Ambulatory Visit: Payer: Medicare HMO

## 2020-12-27 ENCOUNTER — Ambulatory Visit (HOSPITAL_COMMUNITY): Payer: Medicare HMO

## 2020-12-27 ENCOUNTER — Encounter (HOSPITAL_COMMUNITY): Payer: Self-pay | Admitting: Emergency Medicine

## 2020-12-27 ENCOUNTER — Ambulatory Visit (HOSPITAL_COMMUNITY): Payer: Medicare HMO | Admitting: Anesthesiology

## 2020-12-27 ENCOUNTER — Ambulatory Visit (HOSPITAL_COMMUNITY)
Admission: RE | Admit: 2020-12-27 | Discharge: 2020-12-27 | Disposition: A | Discharge status: Home / Self Care | Payer: Medicare HMO | Attending: Emergency Medicine | Admitting: Emergency Medicine

## 2020-12-27 ENCOUNTER — Ambulatory Visit: Payer: Medicare HMO | Admitting: Oncology

## 2020-12-27 DIAGNOSIS — I5030 Unspecified diastolic (congestive) heart failure: Secondary | ICD-10-CM | POA: Diagnosis not present

## 2020-12-27 DIAGNOSIS — Z923 Personal history of irradiation: Secondary | ICD-10-CM | POA: Diagnosis not present

## 2020-12-27 DIAGNOSIS — E039 Hypothyroidism, unspecified: Secondary | ICD-10-CM | POA: Diagnosis not present

## 2020-12-27 DIAGNOSIS — R911 Solitary pulmonary nodule: Secondary | ICD-10-CM | POA: Diagnosis present

## 2020-12-27 DIAGNOSIS — C3411 Malignant neoplasm of upper lobe, right bronchus or lung: Secondary | ICD-10-CM | POA: Diagnosis not present

## 2020-12-27 DIAGNOSIS — I110 Hypertensive heart disease with heart failure: Secondary | ICD-10-CM | POA: Diagnosis not present

## 2020-12-27 DIAGNOSIS — Z9889 Other specified postprocedural states: Secondary | ICD-10-CM

## 2020-12-27 DIAGNOSIS — Z881 Allergy status to other antibiotic agents status: Secondary | ICD-10-CM | POA: Insufficient documentation

## 2020-12-27 DIAGNOSIS — Z79899 Other long term (current) drug therapy: Secondary | ICD-10-CM | POA: Diagnosis not present

## 2020-12-27 DIAGNOSIS — Z880 Allergy status to penicillin: Secondary | ICD-10-CM | POA: Diagnosis not present

## 2020-12-27 DIAGNOSIS — Z882 Allergy status to sulfonamides status: Secondary | ICD-10-CM | POA: Insufficient documentation

## 2020-12-27 DIAGNOSIS — Z79890 Hormone replacement therapy (postmenopausal): Secondary | ICD-10-CM | POA: Diagnosis not present

## 2020-12-27 DIAGNOSIS — D638 Anemia in other chronic diseases classified elsewhere: Secondary | ICD-10-CM | POA: Diagnosis not present

## 2020-12-27 DIAGNOSIS — Z853 Personal history of malignant neoplasm of breast: Secondary | ICD-10-CM | POA: Diagnosis not present

## 2020-12-27 DIAGNOSIS — R846 Abnormal cytological findings in specimens from respiratory organs and thorax: Secondary | ICD-10-CM | POA: Diagnosis not present

## 2020-12-27 HISTORY — PX: BRONCHIAL BRUSHINGS: SHX5108

## 2020-12-27 HISTORY — PX: VIDEO BRONCHOSCOPY WITH ENDOBRONCHIAL NAVIGATION: SHX6175

## 2020-12-27 HISTORY — PX: BRONCHIAL WASHINGS: SHX5105

## 2020-12-27 HISTORY — PX: BRONCHIAL BIOPSY: SHX5109

## 2020-12-27 HISTORY — PX: FIDUCIAL MARKER PLACEMENT: SHX6858

## 2020-12-27 HISTORY — PX: BRONCHIAL NEEDLE ASPIRATION BIOPSY: SHX5106

## 2020-12-27 LAB — GLUCOSE, CAPILLARY: Glucose-Capillary: 111 mg/dL — ABNORMAL HIGH (ref 70–99)

## 2020-12-27 SURGERY — VIDEO BRONCHOSCOPY WITH ENDOBRONCHIAL NAVIGATION
Anesthesia: General

## 2020-12-27 MED ORDER — FENTANYL CITRATE (PF) 100 MCG/2ML IJ SOLN
INTRAMUSCULAR | Status: AC
  Filled 2020-12-27: qty 2

## 2020-12-27 MED ORDER — PHENYLEPHRINE 40 MCG/ML (10ML) SYRINGE FOR IV PUSH (FOR BLOOD PRESSURE SUPPORT)
PREFILLED_SYRINGE | INTRAVENOUS | Status: DC | PRN
  Administered 2020-12-27: 80 ug via INTRAVENOUS

## 2020-12-27 MED ORDER — ONDANSETRON HCL 4 MG/2ML IJ SOLN
INTRAMUSCULAR | Status: DC | PRN
  Administered 2020-12-27: 4 mg via INTRAVENOUS

## 2020-12-27 MED ORDER — AMISULPRIDE (ANTIEMETIC) 5 MG/2ML IV SOLN
10.0000 mg | Freq: Once | INTRAVENOUS | Status: DC | PRN
  Filled 2020-12-27: qty 4

## 2020-12-27 MED ORDER — CHLORHEXIDINE GLUCONATE 0.12 % MT SOLN
15.0000 mL | Freq: Once | OROMUCOSAL | Status: AC
  Administered 2020-12-27: 15 mL via OROMUCOSAL
  Filled 2020-12-27 (×2): qty 15

## 2020-12-27 MED ORDER — ROCURONIUM BROMIDE 10 MG/ML (PF) SYRINGE
PREFILLED_SYRINGE | INTRAVENOUS | Status: DC | PRN
  Administered 2020-12-27: 50 mg via INTRAVENOUS

## 2020-12-27 MED ORDER — DEXAMETHASONE SODIUM PHOSPHATE 10 MG/ML IJ SOLN
INTRAMUSCULAR | Status: DC | PRN
  Administered 2020-12-27: 10 mg via INTRAVENOUS

## 2020-12-27 MED ORDER — FENTANYL CITRATE (PF) 100 MCG/2ML IJ SOLN
25.0000 ug | INTRAMUSCULAR | Status: DC | PRN
Start: 2020-12-27 — End: 2020-12-27

## 2020-12-27 MED ORDER — LIDOCAINE 2% (20 MG/ML) 5 ML SYRINGE
INTRAMUSCULAR | Status: DC | PRN
  Administered 2020-12-27: 100 mg via INTRAVENOUS

## 2020-12-27 MED ORDER — PROPOFOL 10 MG/ML IV BOLUS
INTRAVENOUS | Status: DC | PRN
  Administered 2020-12-27: 50 mg via INTRAVENOUS
  Administered 2020-12-27 (×2): 100 mg via INTRAVENOUS

## 2020-12-27 MED ORDER — GABAPENTIN 100 MG PO CAPS: 100.0000 mg | ORAL_CAPSULE | Freq: Every day | ORAL | Status: DC

## 2020-12-27 MED ORDER — ONDANSETRON HCL 4 MG/2ML IJ SOLN: 4.0000 mg | Freq: Once | INTRAMUSCULAR | Status: DC | PRN

## 2020-12-27 MED ORDER — ESMOLOL HCL 100 MG/10ML IV SOLN
INTRAVENOUS | Status: DC | PRN
  Administered 2020-12-27 (×2): 10 mg via INTRAVENOUS

## 2020-12-27 MED ORDER — SUCCINYLCHOLINE CHLORIDE 200 MG/10ML IV SOSY
PREFILLED_SYRINGE | INTRAVENOUS | Status: DC | PRN
  Administered 2020-12-27: 140 mg via INTRAVENOUS

## 2020-12-27 MED ORDER — OXYCODONE HCL 5 MG/5ML PO SOLN: 5.0000 mg | Freq: Once | ORAL | Status: DC | PRN

## 2020-12-27 MED ORDER — LACTATED RINGERS IV SOLN: INTRAVENOUS | Status: DC

## 2020-12-27 MED ORDER — OXYCODONE HCL 5 MG PO TABS: 5.0000 mg | ORAL_TABLET | Freq: Once | ORAL | Status: DC | PRN

## 2020-12-27 MED ORDER — PHENYLEPHRINE HCL-NACL 10-0.9 MG/250ML-% IV SOLN
INTRAVENOUS | Status: DC | PRN
  Administered 2020-12-27: 50 ug/min via INTRAVENOUS
  Administered 2020-12-27: 65 ug/min via INTRAVENOUS

## 2020-12-27 MED ORDER — FENTANYL CITRATE (PF) 250 MCG/5ML IJ SOLN
INTRAMUSCULAR | Status: DC | PRN
  Administered 2020-12-27 (×2): 50 ug via INTRAVENOUS

## 2020-12-27 MED ORDER — SUGAMMADEX SODIUM 200 MG/2ML IV SOLN
INTRAVENOUS | Status: DC | PRN
  Administered 2020-12-27 (×4): 100 mg via INTRAVENOUS

## 2020-12-27 SURGICAL SUPPLY — 1 items: SuperLock fiducial marker ×12 IMPLANT

## 2020-12-27 NOTE — Transfer of Care (Addendum)
Immediate Anesthesia Transfer of Care Note  Patient: Alisha [name]  Procedure(s) Performed: VIDEO BRONCHOSCOPY WITH ENDOBRONCHIAL NAVIGATION (N/A ) BRONCHIAL BIOPSIES BRONCHIAL BRUSHINGS BRONCHIAL NEEDLE ASPIRATION BIOPSIES BRONCHIAL WASHINGS FIDUCIAL MARKER PLACEMENT  Patient Location: Endoscopy Unit  Anesthesia Type:General  Level of Consciousness: awake, alert  and oriented  Airway & Oxygen Therapy: Patient Spontanous Breathing and Patient connected to face mask oxygen  Post-op Assessment: Report given to RN, Post -op Vital signs reviewed and stable and Patient moving all extremities  Post vital signs: Reviewed and stable  Last Vitals:  Vitals Value Taken Time  BP 180/70   Temp    Pulse 71 12/27/20 1501  Resp 22 12/27/20 1501  SpO2 96 % 12/27/20 1501  Vitals shown include unvalidated device data.  Last Pain:  Vitals:   12/27/20 1134  TempSrc: Oral         Complications: No complications documented.

## 2020-12-27 NOTE — Discharge Instructions (Signed)
Flexible Bronchoscopy  Flexible bronchoscopy is a procedure used to examine the passageways in the lungs. During the procedure, a thin, flexible tool with a camera (bronchoscope) is passed into the mouth or nose, down through the windpipe (trachea), and into the air tubes in the lungs (bronchi). This tool allows the health care provider to look inside the lungs and to take samples for testing, if needed. Tell a health care provider about:  Any allergies you have.  All medicines you are taking, including vitamins, herbs, eye drops, creams, and over-the-counter medicines.  Any problems you or family members have had with anesthetic medicines.  Any blood disorders you have.  Any surgeries you have had.  Any medical conditions you have.  Whether you are pregnant or may be pregnant. What are the risks? Generally, this is a safe procedure. However, problems may occur, including:  Infection.  Bleeding.  Damage to other structures or organs.  Allergic reactions to medicines.  Collapsed lung (pneumothorax).  Increased need for oxygen or difficulty breathing after the procedure. What happens before the procedure? Staying hydrated Follow instructions from your health care provider about hydration, which may include:  Up to 2 hours before the procedure - you may continue to drink clear liquids, such as water, clear fruit juice, black coffee, and plain tea.   Eating and drinking restrictions Follow instructions from your health care provider about eating and drinking, which may include:  8 hours before the procedure - stop eating heavy meals or foods, such as meat, fried foods, or fatty foods.  6 hours before the procedure - stop eating light meals or foods, such as toast or cereal.  6 hours before the procedure - stop drinking milk or drinks that contain milk.  2 hours before the procedure - stop drinking clear liquids. Medicines Ask your health care provider about:  Changing or  stopping your regular medicines. This is especially important if you are taking diabetes medicines or blood thinners.  Taking medicines such as aspirin and ibuprofen. These medicines can thin your blood. Do not take these medicines unless your health care provider tells you to take them.  Taking over-the-counter medicines, vitamins, herbs, and supplements. General instructions  You may be given antibiotic medicine to help lower the risk of infection.  Plan to have a responsible adult take you home from the hospital or clinic.  If you will be going home right after the procedure, plan to have a responsible adult care for you for the time you are told. This is important. What happens during the procedure?  An IV will be inserted into one of your veins.  You will be given a medicine (local anesthetic) to numb your mouth, nose, throat, and voice box (larynx). You may also be given one or more of the following: ? A medicine to help you relax (sedative). ? A medicine to control coughing. ? A medicine to dry up any fluids or secretions in your lungs.  A bronchoscope will be passed into your nose or mouth, and into your lungs. Your health care provider will examine your lungs.  Samples of airway secretions may be collected for testing.  If abnormal areas are seen in your airways, samples of tissue may be removed and checked under a microscope (biopsy).  If tissue samples are needed from the outer parts of the lung, a type of X-ray (fluoroscopy) may be used to guide the bronchoscope to these areas.  If bleeding occurs, you may be given medicine to  stop or decrease the bleeding. The procedure may vary among health care providers and hospitals. What can I expect after the procedure?  Your blood pressure, heart rate, breathing rate, and blood oxygen level will be monitored until you leave the hospital or clinic.  You may have a chest X-ray to check for signs of pneumothorax.  You willnot be  allowed to eat or drink anything for 2 hours after your procedure.  If a biopsy was taken, it is up to you to get the results of the test. Ask your health care provider, or the department that is doing the procedure, when your results will be ready.  You may have the following symptoms for 24-48 hours: ? A cough that is worse than it was before the procedure. ? A low-grade fever. ? A sore throat or hoarse voice. ? Some blood in the mucus from your lungs (sputum), if a biopsy was done. Follow these instructions at home: Eating and drinking  Do not eat or drink anything, including water, for 2 hours after your procedure, or until your numbing medicine has worn off. Having a numb throat increases your risk of burning yourself or choking.  Start eating soft foods and slowly drinking liquids after your numbness is gone and your cough and gag reflexes have returned.  You may return to your normal diet the day after the procedure. Driving  If you were given a sedative during the procedure, it can affect you for several hours. Do not drive or operate machinery until your health care provider says that it is safe.  Ask your health care provider if the medicine prescribed to you requires you to avoid driving or using machinery.  Return to your normal activities as told by your health care provider. Ask your health care provider what activities are safe for you. General instructions  Take over-the-counter and prescription medicines only as told by your health care provider.  Do not use any products that contain nicotine or tobacco. These products include cigarettes, chewing tobacco, and vaping devices, such as e-cigarettes. If you need help quitting, ask your health care provider.  Keep all follow-up visits. This is important.   Get help right away if:  You have shortness of breath that gets worse.  You become light-headed or feel like you might faint.  You have chest pain.  You cough up  more than a small amount of blood. These symptoms may represent a serious problem that is an emergency. Do not wait to see if the symptoms will go away. Get medical help right away. Call your local emergency services (911 in the [place] Do not drive yourself to the hospital. Summary  Flexible bronchoscopy is a procedure that allows your health care provider to look closely inside your lungs and to take testing samples if needed.  Risks of flexible bronchoscopy include bleeding, infection, and collapsed lung (pneumothorax).  Before the procedure, you will be given a medicine to numb your mouth, nose, throat, and voice box. Then, a bronchoscope will be passed into your nose or mouth, and into your lungs.  After the procedure, your blood pressure, heart rate, breathing rate, and blood oxygen level will be monitored until you leave the hospital or clinic. You may have a chest X-ray to check for signs of pneumothorax.  You will not be allowed to eat or drink anything for 2 hours after your procedure. This information is not intended to replace advice given to you by your health care provider.  Make sure you discuss any questions you have with your health care provider. Document Revised: 04/26/2020 Document Reviewed: 04/26/2020 Elsevier Patient Education  [place]. Flexible Bronchoscopy, Care After This sheet gives you information about how to care for yourself after your test. Your doctor may also give you more specific instructions. If you have problems or questions, contact your doctor. Follow these instructions at home: Eating and drinking  Do not eat or drink anything (not even water) for 2 hours after your test, or until your numbing medicine (local anesthetic) wears off.  When your numbness is gone and your cough and gag reflexes have come back, you may: ? Eat only soft foods. ? Slowly drink liquids.  The day after the test, go back to your normal diet. Driving  Do not drive  for 24 hours if you were given a medicine to help you relax (sedative).  Do not drive or use heavy machinery while taking prescription pain medicine. General instructions   Take over-the-counter and prescription medicines only as told by your doctor.  Return to your normal activities as told. Ask what activities are safe for you.  Do not use any products that have nicotine or tobacco in them. This includes cigarettes and e-cigarettes. If you need help quitting, ask your doctor.  Keep all follow-up visits as told by your doctor. This is important. It is very important if you had a tissue sample (biopsy) taken. Get help right away if:  You have shortness of breath that gets worse.  You get light-headed.  You feel like you are going to pass out (faint).  You have chest pain.  You cough up: ? More than a little blood. ? More blood than before. Summary  Do not eat or drink anything (not even water) for 2 hours after your test, or until your numbing medicine wears off.  Do not use cigarettes. Do not use e-cigarettes.  Get help right away if you have chest pain.  Please call our office for any questions or concerns.  [phone].   This information is not intended to replace advice given to you by your health care provider. Make sure you discuss any questions you have with your health care provider. Document Released: 08/03/2009 Document Revised: 09/18/2017 Document Reviewed: 10/24/2016 Elsevier Patient Education  2020 [employer].

## 2020-12-27 NOTE — Interval H&P Note (Signed)
History and Physical Interval Note:  12/27/2020 1:04 PM  Alisha [name]  has presented today for surgery, with the diagnosis of right upper lobe pulmonary nodule.  The various methods of treatment have been discussed with the patient and family. After consideration of risks, benefits and other options for treatment, the patient has consented to  Procedure(s): [place] (N/A) as a surgical intervention.  The patient's history has been reviewed, patient examined, no change in status, stable for surgery.  I have reviewed the patient's chart and labs.  Questions were answered to the patient's satisfaction.     Collene Gobble

## 2020-12-27 NOTE — Anesthesia Procedure Notes (Addendum)
Procedure Name: Intubation Date/Time: 12/27/2020 1:34 PM Performed by: Rande Brunt, CRNA Pre-anesthesia Checklist: Patient identified, Emergency Drugs available, Suction available and Patient being monitored Patient Re-evaluated:Patient Re-evaluated prior to induction Oxygen Delivery Method: Circle System Utilized Preoxygenation: Pre-oxygenation with 100% oxygen Induction Type: IV induction Ventilation: Mask ventilation without difficulty Laryngoscope Size: Glidescope and 3 Tube type: Oral Tube size: 8.5 mm Number of attempts: 1 Airway Equipment and Method: Stylet and Oral airway Placement Confirmation: ETT inserted through vocal cords under direct vision,  positive ETCO2 and breath sounds checked- equal and bilateral Secured at: 22 cm Tube secured with: Tape Dental Injury: Teeth and Oropharynx as per pre-operative assessment  Difficulty Due To: Difficulty was unanticipated Comments: Attempted DL by CRNA x 2 with MAC 4 and Miller 2. Grade 1 view but with ETT 8.5 difficulty getting ETT into mouth with limited mouth opening and teeth. Attempted DL x 1 with MAC 4 by Dr. Elgie Congo and same issue. Used Glidescope GO blade 3 with grade 1 view and visualization thru cords.

## 2020-12-27 NOTE — Op Note (Signed)
Video Bronchoscopy with Electromagnetic Navigation Procedure Note  Date of Operation: 12/27/2020  Pre-op Diagnosis: Enlarging right upper lobe pulmonary nodule  Post-op Diagnosis: Same  Surgeon: Baltazar Apo  Assistants: None  Anesthesia: General endotracheal anesthesia  Operation: Flexible video fiberoptic bronchoscopy with electromagnetic navigation and biopsies.  Estimated Blood Loss: Minimal  Complications: None apparent  Indications and History: Alisha [name] is a 86 y.o. female with history of breast cancer.  She is a new well-circumscribed 9 mm pulmonary nodule on surveillance CT scan imaging.  This was hypermetabolic on PET scan.  Recommendation was made to achieve a tissue diagnosis via navigational bronchoscopy with biopsies.  The risks, benefits, complications, treatment options and expected outcomes were discussed with the patient.  The possibilities of pneumothorax, pneumonia, reaction to medication, pulmonary aspiration, perforation of a viscus, bleeding, failure to diagnose a condition and creating a complication requiring transfusion or operation were discussed with the patient who freely signed the consent.    Description of Procedure: The patient was seen in the Preoperative Area, was examined and was deemed appropriate to proceed.  The patient was taken to [facility] endoscopy room 2, identified as Alisha [name] and the procedure verified as Flexible Video Fiberoptic Bronchoscopy.  A Time Out was held and the above information confirmed.   Prior to the date of the procedure a high-resolution CT scan of the chest was performed. Utilizing [place] a virtual tracheobronchial tree was generated to allow the creation of distinct navigation pathways to the patient's parenchymal abnormalities. After being taken to the operating room general anesthesia was initiated and the patient  was orally intubated. The video fiberoptic bronchoscope was introduced  via the endotracheal tube and a general inspection was performed which showed normal airways throughout.  There were no endobronchial lesions or abnormal secretions. The extendable working channel and locator guide were introduced into the bronchoscope. The distinct navigation pathways prepared prior to this procedure were then utilized to navigate to within 0.4 cm of patient's right upper lobe pulmonary nodule identified on CT scan. The extendable working channel was secured into place and the locator guide was withdrawn. Under fluoroscopic guidance transbronchial needle brushings, transbronchial Wang needle biopsies, and transbronchial forceps biopsies were performed to be sent for cytology and pathology. A bronchioalveolar lavage was performed in the right upper lobe adjacent to the nodule and sent for cytology.  3 fiducial markers were placed under fluoroscopic guidance triangulating the pulmonary nodule should radiation therapy be indicated going forward.  At the end of the procedure a general airway inspection was performed and there was no evidence of active bleeding. The bronchoscope was removed.  The patient tolerated the procedure well. There was no significant blood loss and there were no obvious complications. A post-procedural chest x-ray is pending.  Samples: 1. Transbronchial needle brushings from right upper lobe pulmonary nodule 2. Transbronchial Wang needle biopsies from right upper lobe pulmonary nodule 3. Transbronchial forceps biopsies from right upper lobe pulmonary nodule 4. Bronchoalveolar lavage from right upper lobe  Plans:  The patient will be discharged from the PACU to home when recovered from anesthesia and after chest x-ray is reviewed. We will review the cytology, pathology and microbiology results with the patient when they become available. Outpatient followup will be with Dr. Lamonte Sakai and Dr. Jana Hakim.    Baltazar Apo, MD, PhD 12/27/2020, 2:49 PM [facility] [phone] or if no answer before 7:00PM call [phone] For any issues after 7:00PM please call eLink [phone]

## 2020-12-27 NOTE — Anesthesia Postprocedure Evaluation (Signed)
Anesthesia Post Note  Patient: [name]  Procedure(s) Performed: VIDEO BRONCHOSCOPY WITH ENDOBRONCHIAL NAVIGATION (N/A ) BRONCHIAL BIOPSIES BRONCHIAL BRUSHINGS BRONCHIAL NEEDLE ASPIRATION BIOPSIES BRONCHIAL WASHINGS FIDUCIAL MARKER PLACEMENT     Patient location during evaluation: PACU Anesthesia Type: General Level of consciousness: awake Pain management: pain level controlled Vital Signs Assessment: post-procedure vital signs reviewed and stable Respiratory status: spontaneous breathing and respiratory function stable Cardiovascular status: stable Postop Assessment: no apparent nausea or vomiting Anesthetic complications: no   No complications documented.  Last Vitals:  Vitals:   12/27/20 1550 12/27/20 1600  BP: (!) 175/44 (!) 175/44  Pulse: (!) 53 (!) 54  Resp: 17 14  Temp:    SpO2: 94% 95%    Last Pain:  Vitals:   12/27/20 1530  TempSrc:   PainSc: 0-No pain                 Merlinda Frederick

## 2020-12-27 NOTE — Anesthesia Preprocedure Evaluation (Addendum)
Anesthesia Evaluation  Patient identified by MRN, date of birth, ID band Patient awake    Reviewed: Allergy & Precautions, NPO status , Patient's Chart, lab work & pertinent test results  Airway Mallampati: II  TM Distance: >3 FB Neck ROM: Full    Dental no notable dental hx. (+) Upper Dentures, Lower Dentures   Pulmonary asthma ,    Pulmonary exam normal breath sounds clear to auscultation       Cardiovascular hypertension, negative cardio ROS Normal cardiovascular exam Rhythm:Regular Rate:Normal  EKG reviewed   Neuro/Psych negative neurological ROS  negative psych ROS   GI/Hepatic negative GI ROS, Neg liver ROS,   Endo/Other  Hypothyroidism   Renal/GU negative Renal ROS  negative genitourinary   Musculoskeletal  (+) Arthritis ,   Abdominal   Peds negative pediatric ROS (+)  Hematology negative hematology ROS (+) anemia ,   Anesthesia Other Findings H/o breast cancer  Reproductive/Obstetrics negative OB ROS                            Anesthesia Physical Anesthesia Plan  ASA: III  Anesthesia Plan: General   Post-op Pain Management:    Induction: Intravenous  PONV Risk Score and Plan: 3  Airway Management Planned: Oral ETT  Additional Equipment: None  Intra-op Plan:   Post-operative Plan: Extubation in OR  Informed Consent: I have reviewed the patients History and Physical, chart, labs and discussed the procedure including the risks, benefits and alternatives for the proposed anesthesia with the patient or authorized representative who has indicated his/her understanding and acceptance.     Dental advisory given  Plan Discussed with: CRNA and Anesthesiologist  Anesthesia Plan Comments:        Anesthesia Quick Evaluation

## 2020-12-30 ENCOUNTER — Encounter (HOSPITAL_COMMUNITY): Payer: Self-pay | Admitting: Emergency Medicine

## 2020-12-31 LAB — CYTOLOGY - NON PAP

## 2021-01-01 ENCOUNTER — Telehealth: Payer: Self-pay | Admitting: Emergency Medicine

## 2021-01-01 NOTE — Telephone Encounter (Signed)
Attempted to call pt to review FOB results, show NSCLCA at RUL nodule, probably squamous cell based on staining. No answer. Left message and will try her back

## 2021-01-01 NOTE — Telephone Encounter (Signed)
Attempted to call again - left message

## 2021-01-02 NOTE — Progress Notes (Signed)
[place]  Telephone:([phone]) [phone] Fax:(336) [phone]     ID: Alisha [name] DOB: [birth year]  MR#: [id number]  BOF#:[id number]  Patient Care Team: [name], MD as PCP - General (Family Medicine) Erroll Luna, MD as Consulting Physician (General Surgery) Eppie Gibson, MD as Attending Physician (Radiation Oncology) Leonie Man, MD as Consulting Physician (Cardiology) Sedrick Tober, Virgie Dad, MD as Consulting Physician (Oncology) Collene Gobble, MD as Consulting Physician (Pulmonary Disease) Chauncey Cruel, MD OTHER MD:  I connected with Alisha Roys Morrical on 01/03/21 at 11:45 AM EDT by telephone visit and verified that I am speaking with the correct person using two identifiers.   I discussed the limitations, risks, security and privacy concerns of performing an evaluation and management service by telemedicine and the availability of in-person appointments. I also discussed with the patient that there may be a patient responsible charge related to this service. The patient expressed understanding and agreed to proceed.   Other persons participating in the visit and their role in the encounter: None  Patient's location: Home Provider's location: [place]  Total time spent: 15 min   CHIEF COMPLAINT: triple negative breast cancer  CURRENT TREATMENT: Observation   INTERVAL HISTORY: Alisha [name] was contacted today for follow up of her triple negative breast cancer.   Since her last visit, she underwent bronchoscopy on 12/27/2020 under Dr. Lamonte Sakai. Cytology from the procedure ([id number]) showed: 1. Lung, RUL, brushing  - atypical cells present 2. Lung, RUL, FNA  - malignant cells consistent with non-small cell carcinoma, specifically squamous cell  Second cytology report ([id number]) showed no malignant cells.   REVIEW OF SYSTEMS: Alisha [name] currently has no respiratory symptoms and specifically no cough phlegm production  pleurisy or shortness of breath.  A detailed review of systems today was otherwise entirely stable   COVID 19 VACCINATION STATUS: infection 11/2019; [place] x2, most recently 01/2020   HISTORY OF CURRENT ILLNESS: From the original intake note:  Alisha [name] was noted to have a left breast upper-outer quadrant distortion on screening mammogram in 2017. She proceeded to biopsy ([id number]), which showed a complex sclerosing lesion. She opted to undergo left lumpectomy ([id number]) on 08/11/2016 under Dr. Brantley Stage, which revealed a radial scar with calcifications.   She presented for routine screening mammogram, which showed a possible mass and adjacent calcifications in the left breast. She was referred to [facility] for further imaging on 06/16/2019. Physical exam performed that day showed a palpable soft thickening in the upper central aspect of the left breast. She underwent left diagnostic mammography with tomography and ultrasonography showing: breast density category B; suspicious 7 mm mass in the 12 o'clock location of the left breast; small indeterminate group of calcifications 4 mm anterior to the mass warrant excision if the mass is positive for malignancy; left axilla negative for adenopathy.  Accordingly on 06/22/2019 she proceeded to biopsy of the left breast area in question. The pathology from this procedure ([id number]) showed: invasive ductal carcinoma, grade 3. Prognostic indicators significant for: estrogen receptor, 0% negative and progesterone receptor, 0% negative. Proliferation marker Ki67 at 30%. HER2 negative by immunohistochemistry (0).  The patient's subsequent history is as detailed below.   PAST MEDICAL HISTORY: Past Medical History:  Diagnosis Date  . Arthritis   . Asthma    Never smoker  . Blood transfusion    over 40 y ears  . Breast mass, left   . Cancer ([place])   . History of radiation therapy 09/07/19-  10/06/19   Left Breast 15 fractions of 2.67 Gy  to total 40.05 Gy. Left breast boost 5 fractions of 2 Gy each to total 10 Gy  . Hypertension    on meds  . Hypothyroidism    on meds  . Pre-diabetes     PAST SURGICAL HISTORY: Past Surgical History:  Procedure Laterality Date  . ABDOMINAL HYSTERECTOMY     32 yrs  . BREAST LUMPECTOMY WITH RADIOACTIVE SEED AND SENTINEL LYMPH NODE BIOPSY Left 07/27/2019   Procedure: LEFT BREAST LUMPECTOMY WITH RADIOACTIVE SEED AND LEFT SENTINEL LYMPH NODE MAPPING;  Surgeon: Erroll Luna, MD;  Location: [place];  Service: General;  Laterality: Left;  . BREAST LUMPECTOMY WITH RADIOACTIVE SEED LOCALIZATION Left 08/11/2016   Procedure: LEFT BREAST LUMPECTOMY WITH RADIOACTIVE SEED LOCALIZATION;  Surgeon: Erroll Luna, MD;  Location: [place];  Service: General;  Laterality: Left;  . BRONCHIAL BIOPSY  12/27/2020   Procedure: BRONCHIAL BIOPSIES;  Surgeon: Collene Gobble, MD;  Location[facility] ENDOSCOPY;  Service: Pulmonary;;  . BRONCHIAL BRUSHINGS  12/27/2020   Procedure: BRONCHIAL BRUSHINGS;  Surgeon: Collene Gobble, MD;  Location: [facility] ENDOSCOPY;  Service: Pulmonary;;  . BRONCHIAL NEEDLE ASPIRATION BIOPSY  12/27/2020   Procedure: BRONCHIAL NEEDLE ASPIRATION BIOPSIES;  Surgeon: Collene Gobble, MD;  Location: [place];  Service: Pulmonary;;  . BRONCHIAL WASHINGS  12/27/2020   Procedure: BRONCHIAL WASHINGS;  Surgeon: Collene Gobble, MD;  Location: [facility] ENDOSCOPY;  Service: Pulmonary;;  . CATARACT EXTRACTION, BILATERAL    . CORONARY CALCIUM SCORE-CT ANGIOGRAM  04/2019   Small, < 51mm nodule noted.  Difficult study to read.  Coronary calcium score was 11.  Significant motion artifact, but visible territory showed minimal disease.  Minimal calcium noted in the ostial LAD but no suggestion of significant CAD.  Marland Kitchen EYE SURGERY    . FIDUCIAL MARKER PLACEMENT  12/27/2020   Procedure: FIDUCIAL MARKER PLACEMENT;  Surgeon: Collene Gobble, MD;  Location: [facility] ENDOSCOPY;  Service: Pulmonary;;  . JOINT REPLACEMENT  Bilateral    left knee 2012  . KNEE ARTHROPLASTY  02/17/2012   Procedure: COMPUTER ASSISTED TOTAL KNEE ARTHROPLASTY;  Surgeon: Meredith Pel, MD;  Location: [place];  Service: Orthopedics;  Laterality: Right;  Right total knee replacement  . TRANSTHORACIC ECHOCARDIOGRAM  04/02/2020   Normal LV function.  No R WMA.  Moderate LVH.  GRII DD with elevated filling pressures.  Moderate LA dilation with trivial MR.  Mild aortic valve sclerosis.  Marland Kitchen VIDEO BRONCHOSCOPY WITH ENDOBRONCHIAL NAVIGATION N/A 12/27/2020   Procedure: VIDEO BRONCHOSCOPY WITH ENDOBRONCHIAL NAVIGATION;  Surgeon: Collene Gobble, MD;  Location: [place] ENDOSCOPY;  Service: Pulmonary;  Laterality: N/A;    FAMILY HISTORY: Family History  Problem Relation Age of Onset  . Other Mother        died @ 36 - but was healthy  . Hypertension Sister   . Diabetes Sister   . Diabetes Brother        no longer on medications.   . Anesthesia problems Neg Hx   . Hypotension Neg Hx   . Malignant hyperthermia Neg Hx   . Pseudochol deficiency Neg Hx   Patient's father was in his mid 52s years old when he died from unknown causes. Patient's mother died from "natural causes" at age 41. The patient denies a family hx of breast or ovarian cancer. She has 5 brothers and 1 sister.   GYNECOLOGIC HISTORY:  No LMP recorded. Patient is postmenopausal. 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  She is now retired.  She describes herself a single.  Her daughter Marisa Hua lives in [place].  She has multiple medical problems.  The patient's son died at age 32.  The patient has 1 grandchild, Johnny Bridge, who lives in [place] and works for [employer].  He has 4 children.  The  patient herself attends a local [place] DIRECTIVES: The patient intends to name her grandson Johnny Bridge as her healthcare power of attorney.  He can be reached at [phone].  She was given the appropriate documents to complete and notarize at her discretion at the time of her 07/21/2019 visit   HEALTH MAINTENANCE: Social History   Tobacco Use  . Smoking status: Never Smoker  . Smokeless tobacco: Never Used  Vaping Use  . Vaping Use: Never used  Substance Use Topics  . Alcohol use: No  . Drug use: No     Allergies  Allergen Reactions  . Levofloxacin     Other reaction(s): Unknown  . Lisinopril Itching    No energy  . Penicillins Itching and Rash    Has patient had a PCN reaction causing immediate rash, facial/tongue/throat swelling, SOB or lightheadedness with hypotension: No Has patient had a PCN reaction causing severe rash involving mucus membranes or skin necrosis: No Has patient had a PCN reaction that required hospitalization: No Has patient had a PCN reaction occurring within the last 10 years: No If all of the above answers are "NO", then may proceed with Cephalosporin use.  . Sulfa Antibiotics Itching and Rash    Current Outpatient Medications  Medication Sig Dispense Refill  . acetaminophen (TYLENOL) 500 MG tablet Take 1,000 mg by mouth every 6 (six) hours as needed (pain).    Marland Kitchen albuterol (PROVENTIL HFA;VENTOLIN HFA) 108 (90 Base) MCG/ACT inhaler Inhale 1-2 puffs into the lungs every 6 (six) hours as needed for wheezing or shortness of breath.     Marland Kitchen amLODipine (NORVASC) 5 MG tablet Take 1 tablet (5 mg total) by mouth daily. 90 tablet 3  . Artificial Tear Solution (SOOTHE XP OP) Place 1 drop into both eyes in the morning and at bedtime.    . Biotin 5 MG TABS Take 5 mg by mouth daily at 12 noon.    . cetaphil (CETAPHIL) cream Apply 1 application topically as needed (dry skin).    . chlorthalidone (HYGROTON) 25 MG tablet Take  1 tablet (25 mg total) by mouth daily. 90 tablet 3  . diphenhydrAMINE HCl 50 MG/30ML LIQD Take 50 mg by mouth at bedtime as needed (sleep).    . fluticasone (FLONASE) 50 MCG/ACT nasal spray Place 2 sprays into both nostrils daily.    . furosemide (LASIX) 20 MG tablet Take 20 mg by mouth daily as needed for fluid or edema.    . gabapentin (NEURONTIN) 100 MG capsule Take 1 capsule (100 mg total) by mouth at bedtime.    Marland Kitchen levothyroxine (SYNTHROID, LEVOTHROID) 88 MCG tablet Take 88 mcg by mouth daily before breakfast.     . meclizine (ANTIVERT) 25 MG tablet Take 12.5 mg by mouth 3 (three) times daily as needed for dizziness.    . Multiple Vitamins-Minerals (ICAPS  AREDS 2 PO) Take 1 capsule by mouth 2 (two) times daily.     Marland Kitchen OVER THE COUNTER MEDICATION Take 300 mg by mouth daily at 12 noon. ADRENAL SUPPORT WITH ASHWAGANDHA    . potassium chloride SA (KLOR-CON) 20 MEQ tablet Take 20 mEq by mouth daily as needed (when taking furosemide).    Marland Kitchen spironolactone (ALDACTONE) 25 MG tablet Take 0.5 tablets (12.5 mg total) by mouth daily. 90 tablet 3  . valsartan (DIOVAN) 160 MG tablet Take 1 tablet (160 mg total) by mouth daily. 90 tablet 3   No current facility-administered medications for this visit.    OBJECTIVE:   There were no vitals filed for this visit.   There is no height or weight on file to calculate BMI.   Wt Readings from Last 3 Encounters:  12/21/20 220 lb (99.8 kg)  12/20/20 219 lb 14.4 oz (99.7 kg)  11/12/20 222 lb 6.4 oz (100.9 kg)      ECOG FS:1 - Symptomatic but completely ambulatory  Telemedicine visit 01/03/2021   LAB RESULTS:  CMP     Component Value Date/Time   NA 136 12/20/2020 1456   NA 139 12/13/2020 1035   K 3.7 12/20/2020 1456   CL 104 12/20/2020 1456   CO2 24 12/20/2020 1456   GLUCOSE 164 (H) 12/20/2020 1456   BUN 34 (H) 12/20/2020 1456   BUN 25 12/13/2020 1035   CREATININE 0.88 12/20/2020 1456   CREATININE 0.78 07/21/2019 1525   CALCIUM 8.9 12/20/2020 1456    PROT 6.6 12/20/2020 1456   ALBUMIN 3.9 12/20/2020 1456   AST 15 12/20/2020 1456   AST 15 07/21/2019 1525   ALT 14 12/20/2020 1456   ALT 19 07/21/2019 1525   ALKPHOS 83 12/20/2020 1456   BILITOT 0.2 (L) 12/20/2020 1456   BILITOT 0.2 (L) 07/21/2019 1525   GFRNONAA >60 12/20/2020 1456   GFRNONAA >60 07/21/2019 1525   GFRAA 76 12/13/2020 1035   GFRAA >60 07/21/2019 1525    No results found for: TOTALPROTELP, ALBUMINELP, A1GS, A2GS, BETS, BETA2SER, GAMS, MSPIKE, SPEI  No results found for: KPAFRELGTCHN, LAMBDASER, KAPLAMBRATIO  Lab Results  Component Value Date   WBC 10.5 12/20/2020   NEUTROABS 6.8 12/20/2020   HGB 11.1 (L) 12/20/2020   HCT 33.6 (L) 12/20/2020   MCV 96.0 12/20/2020   PLT 292 12/20/2020   No results found for: LABCA2  No components found for: RXVQMG867  No results for input(s): INR in the last 168 hours.  No results found for: LABCA2  No results found for: YPP509  No results found for: CAN125  No results found for: TOI712  Lab Results  Component Value Date   CA2729 15.8 12/20/2020    No components found for: HGQUANT  Lab Results  Component Value Date   CEA1 1.24 12/20/2020   /  CEA ([facility]In [facility])  Date Value Ref Range Status  12/20/2020 1.24 0.00 - 5.00 ng/mL Final    Comment:    (NOTE) This test was performed using Architect's Chemiluminescent Microparticle Immunoassay. Values obtained from different assay methods cannot be used interchangeably. Please note that 5-10% of patients who smoke may see CEA levels up to 6.9 ng/mL. Performed at [facility], [place] [address] [place], [place] [address]      No results found for: AFPTUMOR  No results found for: [place]  No results found for: HGBA, HGBA2QUANT, HGBFQUANT, HGBSQUAN (Hemoglobinopathy evaluation)   No results found for: LDH  No results found for:  IRON, TIBC, IRONPCTSAT (Iron and TIBC)  No results found for: FERRITIN  Urinalysis     Component Value Date/Time   COLORURINE YELLOW 07/02/2018 [place] 07/02/2018 1752   LABSPEC 1.021 07/02/2018 1752   PHURINE 5.0 07/02/2018 1752   GLUCOSEU NEGATIVE 07/02/2018 1752   HGBUR NEGATIVE 07/02/2018 1752   BILIRUBINUR NEGATIVE 07/02/2018 1752   KETONESUR 20 (A) 07/02/2018 1752   PROTEINUR NEGATIVE 07/02/2018 1752   UROBILINOGEN 1.0 02/20/2012 0927   NITRITE NEGATIVE 07/02/2018 1752   LEUKOCYTESUR NEGATIVE 07/02/2018 1752    STUDIES: DG Chest Port 1 View  Result Date: 12/27/2020 CLINICAL DATA:  Status post bronchoscopy with biopsy. EXAM: PORTABLE CHEST 1 VIEW COMPARISON:  Most recent CT 11/14/2020 FINDINGS: Lung volumes are low. Three fiducial markers project over the right upper lung. No associated soft tissue density to suggest hemorrhage. Known pulmonary nodules are not well seen. No pneumothorax. Prominent heart size which is likely accentuated by low volume technique. No significant pleural effusion. No acute osseous abnormalities. IMPRESSION: Low volume chest radiograph with 3 fiducial markers in the right upper lung. No evidence of postprocedure complication. Electronically Signed   By: Keith Rake M.D.   On: 12/27/2020 16:06   DG C-ARM BRONCHOSCOPY  Result Date: 12/27/2020 C-ARM BRONCHOSCOPY: Fluoroscopy was utilized by the requesting physician.  No radiographic interpretation.     ELIGIBLE FOR AVAILABLE RESEARCH PROTOCOL: no  ASSESSMENT: 86 y.o. [employer], [place] woman status post left breast upper outer quadrant biopsy 06/22/2019 for a clinical T1b N0, stage IB invasive ductal carcinoma, grade 3, triple negative, with an MIB-1-1 of 30%.  (1) status post left lumpectomy and sentinel lymph node sampling 07/27/2019 for a pT1b pN0, stage IB invasive ductal carcinoma, with clear margins.  (a) 2 left axillary lymph nodes were removed  (2) no adjuvant chemotherapy planned  (3) adjuvant radiation 09/07/2019 through 10/06/2019 Site Technique Total  Dose (Gy) Dose per Fx (Gy) Completed Fx Beam Energies  Breast, Left: Breast_Lt 3D 40.05/40.05 2.67 15/15 6X, 10X  Breast, Left: Breast_Lt_Bst specialPort 10/10 2 5/5 15E   (4) CT of the chest 11/14/2020 showed a new (as compared to July 2020) right upper lobe nodule measuring 0.9 cm, with no other findings of concern  (a) PET scan 12/07/2020 find the right upper lobe nodule in question to be hypermetabolic, with no other hypermetabolic areas elsewhere in the chest or abdomen  (b) CA 27-29 and CEA obtained 12/20/2020 were in the normal range  (c) bronchoscopic biopsy 12/27/2020 read as consistent with squamous cell carcinoma  (5) refer to radiation oncology for definitive treatment   PLAN: I discussed the situation with Alisha [name].  She understands this is not related to her breast cancer.  This is a new cancer that has arisen spontaneously in her right lung.  We have staged her with a PET scan which shows no disease elsewhere.  I have discussed this with Dr. Waylan Boga lung cancer specialist and he agrees the way to proceed is with definitive radiation to this small lesion and then follow-up.  He will be glad to see her if needed for example in case of disease recurrence in the future  I have alerted the patient's radiation oncologist so that we can proceed with definitive treatment.  I am scheduling a follow-up appointment with me in June and at that point we will set her up for future restaging studies  Total encounter time 20 minutes.Chauncey Cruel, MD   01/03/2021  12:25 PM Medical Oncology and Hematology [facility] [place], [place] [address] Tel. [phone]    Fax. [phone]   This document serves as a record of services personally performed by Lurline Del, MD. It was created on his behalf by Wilburn Mylar, a trained medical scribe. The creation of this record is based on the scribe's personal observations and the provider's  statements to them.   I, Lurline Del MD, have reviewed the above documentation for accuracy and completeness, and I agree with the above.   *Total Encounter Time as defined by the Centers for Medicare and Medicaid Services includes, in addition to the face-to-face time of a patient visit (documented in the note above) non-face-to-face time: obtaining and reviewing outside history, ordering and reviewing medications, tests or procedures, care coordination (communications with other health care professionals or caregivers) and documentation in the medical record.

## 2021-01-03 ENCOUNTER — Inpatient Hospital Stay (HOSPITAL_BASED_OUTPATIENT_CLINIC_OR_DEPARTMENT_OTHER): Payer: Medicare HMO | Admitting: Oncology

## 2021-01-03 DIAGNOSIS — C50412 Malignant neoplasm of upper-outer quadrant of left female breast: Secondary | ICD-10-CM

## 2021-01-03 DIAGNOSIS — Z171 Estrogen receptor negative status [ER-]: Secondary | ICD-10-CM

## 2021-01-03 NOTE — Telephone Encounter (Signed)
Tried to call the patient - no answer, left her another message.

## 2021-01-04 ENCOUNTER — Telehealth: Payer: Self-pay

## 2021-01-04 NOTE — Telephone Encounter (Signed)
Was able to reach patient. She is doing well post bronchoscopy. She was able to discuss results w Dr Griffith Citron yesterday and plan will likely be to pursue SBRT.

## 2021-01-04 NOTE — Telephone Encounter (Signed)
Was able to reach her - see other note

## 2021-01-04 NOTE — Telephone Encounter (Signed)
Pt is returning Dr. Agustina Caroli call regarding cytology results. Pt can be reached at [phone]. Routing to Dr. Lamonte Sakai

## 2021-01-08 ENCOUNTER — Other Ambulatory Visit: Payer: Self-pay

## 2021-01-08 ENCOUNTER — Ambulatory Visit (INDEPENDENT_AMBULATORY_CARE_PROVIDER_SITE_OTHER): Payer: Medicare HMO | Admitting: Pharmacist

## 2021-01-08 VITALS — BP 134/60 | HR 54

## 2021-01-08 DIAGNOSIS — I10 Essential (primary) hypertension: Secondary | ICD-10-CM | POA: Diagnosis not present

## 2021-01-08 MED ORDER — VALSARTAN 320 MG PO TABS: 320.0000 mg | ORAL_TABLET | Freq: Every day | ORAL | 3 refills | Status: DC

## 2021-01-08 NOTE — Patient Instructions (Addendum)
It was so nice to see you today!  Please increase your valsartan to 320mg  daily. You may take 2 of your 160mg  tablets to equal 320mg .  Continue  chlorthalidone 25 mg daily (AM), amlodipine 5mg  daily, spironolactone 12.5mg  daily  Continue to get your exercise in  Call me at [phone] with any questions

## 2021-01-08 NOTE — Progress Notes (Signed)
Patient ID: Alisha [name]                 DOB: 21-Jul-1935                      MRN: [id number]     HPI: Alisha [name] is a 86 y.o. female referred by [name] to HTN clinic. PMH is significant for chronic bradycardia, diastolic dysfunction, HTN, Breast cancer w/ radiation, hypothyroidism, pre-DM.   She was last seen in HTN clinic for follow-up on 12/13/20. Her blood pressure was 132/58, BMP stable. She had only started on higher dose of valsartan less than 2 weeks prior. No medication changes were made.   Patient presents today for follow up. She denies dizziness, lightheadedness, headache, blurred vision, SOB or swelling. Unfortunately they found a small spot on her lung that came back as cancer. She will need radiation. She has a very positive outlook. BP at home has been 146-154/74-82. Better today in clinic at 134/60. Tries to get her exercise in. It really makes her feel better and improves blood pressure.  Home cuff previously compared to clinic reading 156/62 clinic right arm, Home left wrist-158/75.  Current HTN meds: chlorthalidone 25 mg daily (AM), amlodipine 5mg  daily, spironolactone 12.5mg  daily, valsartan 160mg  daily, PRN Lasix 20mg     Previously tried: lisinopril (low energy, itching), valsartan-HCTZ (low energy, itchiness), amlodipine 2.5mg  daily (fatique, dizziness, "skin issues"), spironolactone 25 mg daily (itching), losartan 12.5 mg daily (itching)  BP goal: <130/80  Family History: HTN in mother and father  Social History: Denies alcohol or smoking  Diet: Continues to work on her diet - enjoys vegetables, beans, chicken, fish, occasional homemade burger (1x/mo), salad - does not add salt to food  Drinks: 1 cup of coffee/day, 6 8-oz glasses of water per day   Exercise: Increased exercise to 30 minutes per day - using weights, exercise bike, and treadmill, treadmill (10 min each per day)  Home BP readings: 146-154/74-82  Wt Readings from Last 3  Encounters:  12/21/20 220 lb (99.8 kg)  12/20/20 219 lb 14.4 oz (99.7 kg)  11/12/20 222 lb 6.4 oz (100.9 kg)   BP Readings from Last 3 Encounters:  12/27/20 (!) 175/44  12/21/20 134/80  12/20/20 (!) 174/71   Pulse Readings from Last 3 Encounters:  12/27/20 (!) 54  12/21/20 (!) 56  12/20/20 (!) 57    Renal function: CrCl cannot be calculated (Unknown ideal weight.).  Past Medical History:  Diagnosis Date  . Arthritis   . Asthma    Never smoker  . Blood transfusion    over 40 y ears  . Breast mass, left   . Cancer ([place])   . History of radiation therapy 09/07/19- 10/06/19   Left Breast 15 fractions of 2.67 Gy to total 40.05 Gy. Left breast boost 5 fractions of 2 Gy each to total 10 Gy  . Hypertension    on meds  . Hypothyroidism    on meds  . Pre-diabetes     Current Outpatient Medications on File Prior to Visit  Medication Sig Dispense Refill  . acetaminophen (TYLENOL) 500 MG tablet Take 1,000 mg by mouth every 6 (six) hours as needed (pain).    Marland Kitchen albuterol (PROVENTIL HFA;VENTOLIN HFA) 108 (90 Base) MCG/ACT inhaler Inhale 1-2 puffs into the lungs every 6 (six) hours as needed for wheezing or shortness of breath.     Marland Kitchen amLODipine (NORVASC) 5 MG tablet Take 1 tablet (5 mg total)  by mouth daily. 90 tablet 3  . Artificial Tear Solution (SOOTHE XP OP) Place 1 drop into both eyes in the morning and at bedtime.    . Biotin 5 MG TABS Take 5 mg by mouth daily at 12 noon.    . cetaphil (CETAPHIL) cream Apply 1 application topically as needed (dry skin).    . chlorthalidone (HYGROTON) 25 MG tablet Take 1 tablet (25 mg total) by mouth daily. 90 tablet 3  . diphenhydrAMINE HCl 50 MG/30ML LIQD Take 50 mg by mouth at bedtime as needed (sleep).    . fluticasone (FLONASE) 50 MCG/ACT nasal spray Place 2 sprays into both nostrils daily.    . furosemide (LASIX) 20 MG tablet Take 20 mg by mouth daily as needed for fluid or edema.    . gabapentin (NEURONTIN) 100 MG capsule Take 1 capsule  (100 mg total) by mouth at bedtime.    Marland Kitchen levothyroxine (SYNTHROID, LEVOTHROID) 88 MCG tablet Take 88 mcg by mouth daily before breakfast.     . meclizine (ANTIVERT) 25 MG tablet Take 12.5 mg by mouth 3 (three) times daily as needed for dizziness.    . Multiple Vitamins-Minerals (ICAPS AREDS 2 PO) Take 1 capsule by mouth 2 (two) times daily.     Marland Kitchen OVER THE COUNTER MEDICATION Take 300 mg by mouth daily at 12 noon. ADRENAL SUPPORT WITH ASHWAGANDHA    . potassium chloride SA (KLOR-CON) 20 MEQ tablet Take 20 mEq by mouth daily as needed (when taking furosemide).    Marland Kitchen spironolactone (ALDACTONE) 25 MG tablet Take 0.5 tablets (12.5 mg total) by mouth daily. 90 tablet 3  . valsartan (DIOVAN) 160 MG tablet Take 1 tablet (160 mg total) by mouth daily. 90 tablet 3   No current facility-administered medications on file prior to visit.    Allergies  Allergen Reactions  . Levofloxacin     Other reaction(s): Unknown  . Lisinopril Itching    No energy  . Penicillins Itching and Rash    Has patient had a PCN reaction causing immediate rash, facial/tongue/throat swelling, SOB or lightheadedness with hypotension: No Has patient had a PCN reaction causing severe rash involving mucus membranes or skin necrosis: No Has patient had a PCN reaction that required hospitalization: No Has patient had a PCN reaction occurring within the last 10 years: No If all of the above answers are "NO", then may proceed with Cephalosporin use.  . Sulfa Antibiotics Itching and Rash    Assessment/Plan:  1. Hypertension - Blood pressure above goal of <130/80 in clinic today. Will increase valsartan to 320mg  daily.  Continue chlorthalidone 25 mg daily (AM), amlodipine 5mg  daily and spironolactone 12.5mg  daily. Follow up in clinic in 2 weeks. Will check BMP at next visit.   Ramond Dial, Pharm.D, BCPS, CPP [place]  [address], [place], [place] [address]  Phone: [phone]; Fax: [phone]

## 2021-01-10 ENCOUNTER — Other Ambulatory Visit: Payer: Self-pay | Admitting: *Deleted

## 2021-01-10 NOTE — Progress Notes (Signed)
The proposed treatment discussed in cancer conference is for discussion purpose only and is not a binding recommendation. The patient was not physically examined nor present for their treatment options. Therefore, final treatment plans cannot be decided.  ?

## 2021-01-11 LAB — CYTOLOGY - NON PAP

## 2021-01-14 ENCOUNTER — Encounter: Payer: Self-pay | Admitting: *Deleted

## 2021-01-14 DIAGNOSIS — C3491 Malignant neoplasm of unspecified part of right bronchus or lung: Secondary | ICD-10-CM

## 2021-01-22 NOTE — Progress Notes (Signed)
Thoracic Location of Tumor / Histology:   Non-small cell carcinoma of RIGHT lung (Though not specific, the immunoprofile is suggestive of squamous cell carcinoma)  CT Chest w/ Contrast 11/14/2020 IMPRESSION: 1. New, well-circumscribed right upper lobe pulmonary nodule measuring up to 9 mm in diameter. Although not morphologically suspicious, this could reflect metastatic disease in this patient with a history of breast cancer. This nodule is borderline in size for further evaluation with PET-CT. Consider one of the following in 3 months for both low-risk and high-risk individuals: (a) repeat chest CT, (b) follow-up PET-CT, or (c) tissue sampling. Fleischner criteria do not apply given the personal history of breast cancer, although this recommendation is based on the consensus statement: Guidelines for Management of Incidental Pulmonary Nodules Detected on CT Images: From the Fleischner Society 2017; Radiology 2017; 284:228-243. 2. Multiple other smaller pulmonary nodules are stable and considered benign. No adenopathy or pleural effusion. 3. Postsurgical changes in the left breast without evidence of local recurrence. 4. Aortic Atherosclerosis (ICD10-I70.0).  Biopsies revealed: 12/27/2020 FINAL MICROSCOPIC DIAGNOSIS:  A. LUNG, RUL, BRUSHING:  - Atypical cells present  B. LUNG, RUL, FINE NEEDLE ASPIRATION:  - Malignant cells consistent with non-small cell carcinoma. See comment  COMMENT:  Immunohistochemical stains show that the tumor cells are negative for TTF-1 and p63 with patchy, mostly weak labeling for CK 5/6. Though not specific, the immunoprofile is suggestive of squamous cell carcinoma.   Tobacco/Marijuana/Snuff/ETOH use:  Patient has never smoked or used smokeless tobacco. Denies any alcohol consumption or recreational drug use  Past/Anticipated interventions by cardiothoracic surgery, if any:  12/27/2020 Dr. Baltazar Apo Flexible video fiberoptic bronchoscopy with  electromagnetic navigation and biopsies  Past/Anticipated interventions by medical oncology, if any:  Under care of Dr. Lurline Del 01/03/2021 --Previously (for breast cancer) (1) status post left lumpectomy and sentinel lymph node sampling 07/27/2019 for a pT1b pN0, stage IB invasive ductal carcinoma, with clear margins.             (a) 2 left axillary lymph nodes were removed (2) no adjuvant chemotherapy planned (3) adjuvant radiation 09/07/2019 through 10/06/2019 --Recently (newly diagnosed lung cancer) (4) CT of the chest 11/14/2020 showed a new (as compared to July 2020) right upper lobe nodule measuring 0.9 cm, with no other findings of concern             (a) PET scan 12/07/2020 find the right upper lobe nodule in question to be hypermetabolic, with no other hypermetabolic areas elsewhere in the chest or abdomen             (b) CA 27-29 and CEA obtained 12/20/2020 were in the normal range             (c) bronchoscopic biopsy 12/27/2020 read as consistent with squamous cell carcinoma (5) refer to radiation oncology for definitive treatment  Signs/Symptoms  Weight changes, if any: Patient denies  Respiratory complaints, if any: Patient denies  Hemoptysis, if any: Patient denies  Pain issues, if any:  Patient denies  SAFETY ISSUES:  Prior radiation? Yes: 09/07/2019--10/06/2019   Pacemaker/ICD? No   Possible current pregnancy? No--hysterectomy   Is the patient on methotrexate? 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

## 2021-01-23 ENCOUNTER — Other Ambulatory Visit: Payer: Self-pay

## 2021-01-23 ENCOUNTER — Ambulatory Visit
Admission: RE | Admit: 2021-01-23 | Discharge: 2021-01-23 | Disposition: A | Discharge status: Home / Self Care | Payer: Medicare HMO | Source: Ambulatory Visit | Attending: Radiation Oncology | Admitting: Radiation Oncology

## 2021-01-23 ENCOUNTER — Encounter: Payer: Self-pay | Admitting: Radiation Oncology

## 2021-01-23 VITALS — BP 135/47 | HR 55 | Temp 97.7°F | Resp 20 | Ht 64.5 in | Wt 216.0 lb

## 2021-01-23 DIAGNOSIS — Z171 Estrogen receptor negative status [ER-]: Secondary | ICD-10-CM | POA: Diagnosis not present

## 2021-01-23 DIAGNOSIS — C50412 Malignant neoplasm of upper-outer quadrant of left female breast: Secondary | ICD-10-CM | POA: Diagnosis not present

## 2021-01-23 DIAGNOSIS — C3411 Malignant neoplasm of upper lobe, right bronchus or lung: Secondary | ICD-10-CM | POA: Diagnosis not present

## 2021-01-23 DIAGNOSIS — I10 Essential (primary) hypertension: Secondary | ICD-10-CM | POA: Insufficient documentation

## 2021-01-23 DIAGNOSIS — Z77098 Contact with and (suspected) exposure to other hazardous, chiefly nonmedicinal, chemicals: Secondary | ICD-10-CM | POA: Diagnosis not present

## 2021-01-23 DIAGNOSIS — E039 Hypothyroidism, unspecified: Secondary | ICD-10-CM | POA: Diagnosis not present

## 2021-01-23 DIAGNOSIS — Z923 Personal history of irradiation: Secondary | ICD-10-CM | POA: Insufficient documentation

## 2021-01-23 DIAGNOSIS — Z79899 Other long term (current) drug therapy: Secondary | ICD-10-CM | POA: Diagnosis not present

## 2021-01-23 DIAGNOSIS — M129 Arthropathy, unspecified: Secondary | ICD-10-CM | POA: Insufficient documentation

## 2021-01-23 DIAGNOSIS — C3491 Malignant neoplasm of unspecified part of right bronchus or lung: Secondary | ICD-10-CM

## 2021-01-23 HISTORY — DX: Malignant neoplasm of upper lobe, right bronchus or lung: C34.11

## 2021-01-23 NOTE — Progress Notes (Signed)
Radiation Oncology         [phone] ________________________________  Re-Consultation Note  Name: Alisha [name] MRN: [id number]  Date: 01/23/2021  DOB: May 05, 1935  CC:[name], MD  Magrinat, Virgie Dad, MD   REFERRING PHYSICIAN: Magrinat, Virgie Dad, MD  DIAGNOSIS:    ICD-10-CM   1. Non-small cell carcinoma of right lung ([place])  C34.91   2. Cancer of upper lobe of right lung (HCC)  C34.11 Pulmonary function test  3. Malignant neoplasm of upper lobe of right lung (HCC)  C34.11     Cancer Staging Malignant neoplasm of upper lobe of right lung [facility]) Staging form: Lung, AJCC 8th Edition - Clinical stage from 01/23/2021: cT1, cN0, cM0 - Signed by Eppie Gibson, MD on 01/23/2021 Stage prefix: Initial diagnosis Laterality: Right  Malignant neoplasm of upper-outer quadrant of left breast in female, estrogen receptor negative ([place]) Staging form: Breast, AJCC 8th Edition - Clinical stage from 06/22/2019: Stage IB (cT1b, cN0, cM0, G3, ER-, PR-, HER2-) - Signed by Gardenia Phlegm, NP on 06/29/2019 Stage prefix: Initial diagnosis Histologic grading system: 3 grade system - Pathologic: Stage IB (pT1b, pN0, cM0, G3, ER-, PR-, HER2-) - Signed by Gardenia Phlegm, NP on 08/26/2020 Stage prefix: Initial diagnosis Histologic grading system: 3 grade system  Cancer Staging Malignant neoplasm of upper lobe of right lung [facility]) Staging form: Lung, AJCC 8th Edition - Clinical stage from 01/23/2021: cT1, cN0, cM0 - Signed by Eppie Gibson, MD on 01/23/2021 Stage prefix: Initial diagnosis Laterality: Right  Malignant neoplasm of upper-outer quadrant of left breast in female, estrogen receptor negative ([place]) Staging form: Breast, AJCC 8th Edition - Clinical stage from 06/22/2019: Stage IB (cT1b, cN0, cM0, G3, ER-, PR-, HER2-) - Signed by Gardenia Phlegm, NP on 06/29/2019 Stage prefix: Initial diagnosis Histologic grading system: 3 grade system - Pathologic: Stage IB (pT1b, pN0,  cM0, G3, ER-, PR-, HER2-) - Signed by Gardenia Phlegm, NP on 08/26/2020 Stage prefix: Initial diagnosis Histologic grading system: 3 grade system    CHIEF COMPLAINT: Here to discuss management of right lung cancer  HISTORY OF PRESENT ILLNESS::Alisha [name] is a 86 y.o. female who was treated by me in 2020 for left breast cancer.  In more recent history, the patient underwent a chest CT scan on 11/14/2020 for follow-up of pulmonary nodule. Results showed a new, well-circumscribed right upper lobe pulmonary nodule that measured 9 mm in diameter. Although not morphologically suspicious, it could reflect metastatic disease given history of breast cancer. The multiple other smaller pulmonary nodules were stable and considered benign. There was no adenopathy or pleural effusion. Finally, there were post-surgical changes in the left breast without evidence of local recurrence.  Subsequently, the patient was seen by Dr. Lamonte Sakai and underwent a video bronchoscopy with electromagnetic navigation on 12/27/2020. Cytology from the procedure revealed malignant cells consistent with non-small cell carcinoma of the right upper lobe fine needle aspiration.  Favored to be squamous cell carcinoma. atypical cells were also found in the right upper lobe brushing.  Of note she underwent a PET scan through [facility] in February which showed hypermetabolic activity in the right upper lung mass but no other sites suspicious for cancer.  She sees Dr. Lamonte Sakai later this week.  She has not had recent pulmonary function test.  She denies recent respiratory symptoms.  She denies weight changes or hemoptysis.  She denies pain.  She had Covid about 1 year ago and is vaccinated and boosted.  PAST MEDICAL HISTORY:  has a past medical history of Arthritis, Asthma, Blood transfusion, Breast mass, left, Cancer ([place]), History of radiation therapy (09/07/19- 10/06/19), Hypertension, Hypothyroidism, and Pre-diabetes.    PAST  SURGICAL HISTORY: Past Surgical History:  Procedure Laterality Date  . ABDOMINAL HYSTERECTOMY     32 yrs  . BREAST LUMPECTOMY WITH RADIOACTIVE SEED AND SENTINEL LYMPH NODE BIOPSY Left 07/27/2019   Procedure: LEFT BREAST LUMPECTOMY WITH RADIOACTIVE SEED AND LEFT SENTINEL LYMPH NODE MAPPING;  Surgeon: Erroll Luna, MD;  Location: [place];  Service: General;  Laterality: Left;  . BREAST LUMPECTOMY WITH RADIOACTIVE SEED LOCALIZATION Left 08/11/2016   Procedure: LEFT BREAST LUMPECTOMY WITH RADIOACTIVE SEED LOCALIZATION;  Surgeon: Erroll Luna, MD;  Location: [place];  Service: General;  Laterality: Left;  . BRONCHIAL BIOPSY  12/27/2020   Procedure: BRONCHIAL BIOPSIES;  Surgeon: Collene Gobble, MD;  Location: [facility] ENDOSCOPY;  Service: Pulmonary;;  . BRONCHIAL BRUSHINGS  12/27/2020   Procedure: BRONCHIAL BRUSHINGS;  Surgeon: Collene Gobble, MD;  Location: [facility] ENDOSCOPY;  Service: Pulmonary;;  . BRONCHIAL NEEDLE ASPIRATION BIOPSY  12/27/2020   Procedure: BRONCHIAL NEEDLE ASPIRATION BIOPSIES;  Surgeon: Collene Gobble, MD;  Location: [place];  Service: Pulmonary;;  . BRONCHIAL WASHINGS  12/27/2020   Procedure: BRONCHIAL WASHINGS;  Surgeon: Collene Gobble, MD;  Location: [facility] ENDOSCOPY;  Service: Pulmonary;;  . CATARACT EXTRACTION, BILATERAL    . CORONARY CALCIUM SCORE-CT ANGIOGRAM  04/2019   Small, < 53mm nodule noted.  Difficult study to read.  Coronary calcium score was 11.  Significant motion artifact, but visible territory showed minimal disease.  Minimal calcium noted in the ostial LAD but no suggestion of significant CAD.  Marland Kitchen EYE SURGERY    . FIDUCIAL MARKER PLACEMENT  12/27/2020   Procedure: FIDUCIAL MARKER PLACEMENT;  Surgeon: Collene Gobble, MD;  Location: [facility] ENDOSCOPY;  Service: Pulmonary;;  . JOINT REPLACEMENT Bilateral    left knee 2012  . KNEE ARTHROPLASTY  02/17/2012   Procedure: COMPUTER ASSISTED TOTAL KNEE ARTHROPLASTY;  Surgeon: Meredith Pel, MD;  Location: [place];  Service: Orthopedics;  Laterality: Right;  Right total knee replacement  . TRANSTHORACIC ECHOCARDIOGRAM  04/02/2020   Normal LV function.  No R WMA.  Moderate LVH.  GRII DD with elevated filling pressures.  Moderate LA dilation with trivial MR.  Mild aortic valve sclerosis.  Marland Kitchen VIDEO BRONCHOSCOPY WITH ENDOBRONCHIAL NAVIGATION N/A 12/27/2020   Procedure: VIDEO BRONCHOSCOPY WITH ENDOBRONCHIAL NAVIGATION;  Surgeon: Collene Gobble, MD;  Location: [place] ENDOSCOPY;  Service: Pulmonary;  Laterality: N/A;    FAMILY HISTORY: family history includes Diabetes in her brother and sister; Hypertension in her sister; Other in her mother.  SOCIAL HISTORY:  reports that she has never smoked. She has never used smokeless tobacco. She reports that she does not drink alcohol and does not use drugs.  ALLERGIES: Levofloxacin, Lisinopril, Penicillins, and Sulfa antibiotics  MEDICATIONS:  Current Outpatient Medications  Medication Sig Dispense Refill  . acetaminophen (TYLENOL) 500 MG tablet Take 1,000 mg by mouth every 6 (six) hours as needed (pain).    Marland Kitchen albuterol (PROVENTIL HFA;VENTOLIN HFA) 108 (90 Base) MCG/ACT inhaler Inhale 1-2 puffs into the lungs every 6 (six) hours as needed for wheezing or shortness of breath.     Marland Kitchen amLODipine (NORVASC) 5 MG tablet Take 1 tablet (5 mg total) by mouth daily. 90 tablet 3  . Artificial Tear Solution (SOOTHE XP OP) Place 1 drop into both eyes in the morning and at bedtime.    Marland Kitchen  Biotin 5 MG TABS Take 5 mg by mouth daily at 12 noon.    . cetaphil (CETAPHIL) cream Apply 1 application topically as needed (dry skin).    . chlorthalidone (HYGROTON) 25 MG tablet Take 1 tablet (25 mg total) by mouth daily. 90 tablet 3  . diphenhydrAMINE HCl 50 MG/30ML LIQD Take 50 mg by mouth at bedtime as needed (sleep).    . fluticasone (FLONASE) 50 MCG/ACT nasal spray Place 2 sprays into both nostrils daily.    . furosemide (LASIX) 20 MG tablet Take 20 mg by mouth daily as needed for fluid or  edema.    . gabapentin (NEURONTIN) 100 MG capsule Take 1 capsule (100 mg total) by mouth at bedtime.    Marland Kitchen levothyroxine (SYNTHROID, LEVOTHROID) 88 MCG tablet Take 88 mcg by mouth daily before breakfast.     . meclizine (ANTIVERT) 25 MG tablet Take 12.5 mg by mouth 3 (three) times daily as needed for dizziness.    . Multiple Vitamins-Minerals (ICAPS AREDS 2 PO) Take 1 capsule by mouth 2 (two) times daily.     Marland Kitchen OVER THE COUNTER MEDICATION Take 300 mg by mouth daily at 12 noon. ADRENAL SUPPORT WITH ASHWAGANDHA    . potassium chloride SA (KLOR-CON) 20 MEQ tablet Take 20 mEq by mouth daily as needed (when taking furosemide).    Marland Kitchen spironolactone (ALDACTONE) 25 MG tablet Take 0.5 tablets (12.5 mg total) by mouth daily. 90 tablet 3  . valsartan (DIOVAN) 320 MG tablet Take 1 tablet (320 mg total) by mouth daily. 90 tablet 3   No current facility-administered medications for this encounter.    REVIEW OF SYSTEMS:  Notable for that above.   PHYSICAL EXAM:  height is 5' 4.5" (1.638 m) and weight is 216 lb (98 kg). Her temperature is 97.7 F (36.5 C). Her blood pressure is 135/47 (abnormal) and her pulse is 55 (abnormal). Her respiration is 20 and oxygen saturation is 100%.   General: Alert and oriented, in no acute distress - well-appearing HEENT: Head is normocephalic. Oropharynx is clear. Neck: Neck is supple, no palpable cervical or supraclavicular lymphadenopathy. Heart: Regular in rate and rhythm with no murmurs, rubs, or gallops. Chest: Clear to auscultation bilaterally, with no rhonchi, wheezes, or rales. Neurologic:  No obvious focalities. Speech is fluent. Coordination is intact. Psychiatric: Judgment and insight are intact. Affect is appropriate.   ECOG = 1  0 - Asymptomatic (Fully active, able to carry on all predisease activities without restriction)  1 - Symptomatic but completely ambulatory (Restricted in physically strenuous activity but ambulatory and able to carry out work of a  light or sedentary nature. For example, light housework, office work)  2 - Symptomatic, <50% in bed during the day (Ambulatory and capable of all self care but unable to carry out any work activities. Up and about more than 50% of waking hours)  3 - Symptomatic, >50% in bed, but not bedbound (Capable of only limited self-care, confined to bed or chair 50% or more of waking hours)  4 - Bedbound (Completely disabled. Cannot carry on any self-care. Totally confined to bed or chair)  5 - Death   Santiago Glad MM, Creech RH, Tormey DC, et al. [phone]). "Toxicity and response criteria of the [facility]". Am. Evlyn Clines. Oncol. 5 (6): 649-55   LABORATORY DATA:  Lab Results  Component Value Date   WBC 10.5 12/20/2020   HGB 11.1 (L) 12/20/2020   HCT 33.6 (L) 12/20/2020   MCV 96.0  12/20/2020   PLT 292 12/20/2020   CMP     Component Value Date/Time   NA 136 12/20/2020 1456   NA 139 12/13/2020 1035   K 3.7 12/20/2020 1456   CL 104 12/20/2020 1456   CO2 24 12/20/2020 1456   GLUCOSE 164 (H) 12/20/2020 1456   BUN 34 (H) 12/20/2020 1456   BUN 25 12/13/2020 1035   CREATININE 0.88 12/20/2020 1456   CREATININE 0.78 07/21/2019 1525   CALCIUM 8.9 12/20/2020 1456   PROT 6.6 12/20/2020 1456   ALBUMIN 3.9 12/20/2020 1456   AST 15 12/20/2020 1456   AST 15 07/21/2019 1525   ALT 14 12/20/2020 1456   ALT 19 07/21/2019 1525   ALKPHOS 83 12/20/2020 1456   BILITOT 0.2 (L) 12/20/2020 1456   BILITOT 0.2 (L) 07/21/2019 1525   GFRNONAA >60 12/20/2020 1456   GFRNONAA >60 07/21/2019 1525   GFRAA 76 12/13/2020 1035   GFRAA >60 07/21/2019 1525         RADIOGRAPHY: DG Chest Port 1 View  Result Date: 12/27/2020 CLINICAL DATA:  Status post bronchoscopy with biopsy. EXAM: PORTABLE CHEST 1 VIEW COMPARISON:  Most recent CT 11/14/2020 FINDINGS: Lung volumes are low. Three fiducial markers project over the right upper lung. No associated soft tissue density to suggest hemorrhage. Known pulmonary  nodules are not well seen. No pneumothorax. Prominent heart size which is likely accentuated by low volume technique. No significant pleural effusion. No acute osseous abnormalities. IMPRESSION: Low volume chest radiograph with 3 fiducial markers in the right upper lung. No evidence of postprocedure complication. Electronically Signed   By: Keith Rake M.D.   On: 12/27/2020 16:06   DG C-ARM BRONCHOSCOPY  Result Date: 12/27/2020 C-ARM BRONCHOSCOPY: Fluoroscopy was utilized by the requesting physician.  No radiographic interpretation.      IMPRESSION/PLAN: Right upper lung non-small cell lung cancer, family squamous cell histology  She denies history of smoking.  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  PET scan negative for other sites of disease.  Today, I talked to the patient about the findings and work-up thus far. We discussed the patient's diagnosis of lung cancer and general treatment for this, highlighting the role of radiotherapy in the management. We discussed the available radiation techniques, and focused on the details of logistics and delivery.    She understands that her 2 main options are surgical resection versus stereotactic body radiation therapy.  She sees Dr. Lamonte Sakai tomorrow.  Her understanding secondhand through medical oncology is that she is not an optimal surgical candidate but she will discuss her options thoroughly tomorrow.  She is enthusiastic about stereotactic body radiation therapy.  I believe she will tolerate this well and anticipate 3 fractions of treatment . We discussed the risks, benefits, and side effects of radiotherapy. Side effects may include but not necessarily be limited to: Fatigue, skin irritation, injury to tissues of the thorax including lung, rib, nerves, airways. No guarantees of treatment were given. A consent form was signed and placed in the patient's medical record. The patient was encouraged to ask questions that  I answered to the best of my ability.  I have ordered pulmonary function test to take place before she starts radiation therapy.  We will get her scheduled for a tentative treatment planning session during the week of April 18.    On date of service, in total, I spent 35 minutes on this encounter. Patient was seen in person.  __________________________________________   Eppie Gibson, MD  This document serves as a record of services personally performed by Eppie Gibson, MD. It was created on his behalf by Clerance Lav, a trained medical scribe. The creation of this record is based on the scribe's personal observations and the provider's statements to them. This document has been checked and approved by the attending provider.

## 2021-01-24 ENCOUNTER — Encounter: Payer: Self-pay | Admitting: Emergency Medicine

## 2021-01-24 ENCOUNTER — Ambulatory Visit (INDEPENDENT_AMBULATORY_CARE_PROVIDER_SITE_OTHER): Payer: Medicare HMO | Admitting: Emergency Medicine

## 2021-01-24 ENCOUNTER — Ambulatory Visit (INDEPENDENT_AMBULATORY_CARE_PROVIDER_SITE_OTHER): Payer: Medicare HMO | Admitting: Pharmacist

## 2021-01-24 VITALS — BP 120/50 | HR 57

## 2021-01-24 DIAGNOSIS — I10 Essential (primary) hypertension: Secondary | ICD-10-CM

## 2021-01-24 DIAGNOSIS — J301 Allergic rhinitis due to pollen: Secondary | ICD-10-CM | POA: Diagnosis not present

## 2021-01-24 DIAGNOSIS — J4520 Mild intermittent asthma, uncomplicated: Secondary | ICD-10-CM

## 2021-01-24 DIAGNOSIS — C3411 Malignant neoplasm of upper lobe, right bronchus or lung: Secondary | ICD-10-CM | POA: Diagnosis not present

## 2021-01-24 DIAGNOSIS — J45909 Unspecified asthma, uncomplicated: Secondary | ICD-10-CM | POA: Insufficient documentation

## 2021-01-24 DIAGNOSIS — J309 Allergic rhinitis, unspecified: Secondary | ICD-10-CM | POA: Insufficient documentation

## 2021-01-24 NOTE — Progress Notes (Signed)
Subjective:    Patient ID: Alisha [name], female    DOB: 1935/07/29, 86 y.o.   MRN: [id number]  HPI 86 year old never smoker with a history of breast cancer treated with lumpectomy and then radiation followed by Dr. Jana Hakim.  Also with a history of arthritis, asthma dx 50 yrs ago. She is referred today for evaluation abnormal CT scan of the chest.  She has not required pred for her asthma in over 15 yrs. She is exercising and is able to exert. She is walking 30 min a day.   She has pulmonary nodules that have been followed with serial CT scans.  I reviewed the scans.  There were multiple small solid and groundglass pulmonary nodules largest 6 mm seen on a CT chest from 03/08/2019 that were stable in size and appearance compared with a scan 09/13/2018.  The most recent scan was 11/14/2020 which again showed multiple predominantly right-sided nodules that are unchanged from prior.  There is a new 9 mm round central right upper lobe pulmonary nodule not seen on the priors.  A PET scan was done at [facility] on 12/07/2020 and apparently showed that the new right upper lobe nodule was hypermetabolic, there was no other hypermetabolism noted including the other nodules and the mediastinal nodes.  I do not have this image available for review at this time.   ROV 01/24/21 --86 year old woman who returns today for follow-up after bronchoscopy.  She had a new 9 mm round central right upper lobe pulmonary nodule that appeared on her surveillance CT scan 11/14/2020 that was ordered to follow her breast cancer (Dr. Jana Hakim).  She also has remote asthma that does not appear to still be active.  Bronchoscopy was 12/27/2020, and needle samples were most consistent with squamous cell carcinoma following immunohistochemistry staining.  She has been seen by radiation oncology and is planning for SBRT with Dr Isidore Moos. Her breathing has been stable, no flares. She uses albuterol occasionally, usually if she is exerting  during allergy season, pollen.  She is using flonase most days.   MDM: Reviewed radiation oncology notes from 01/23/2021, oncology note from 01/03/2021  Review of Systems As per HPI     Objective:   Physical Exam Vitals:   01/24/21 1343  BP: 120/70  Pulse: (!) 55  Temp: 97.9 F (36.6 C)  TempSrc: Temporal  SpO2: 98%  Weight: 216 lb 9.6 oz (98.2 kg)  Height: 5' 4.5" (1.638 m)   Gen: Pleasant, overwt woman, in no distress,  normal affect  ENT: No lesions,  mouth clear,  oropharynx clear, no postnasal drip  Neck: No JVD, no stridor  Lungs: No use of accessory muscles, no crackles or wheezing on normal respiration, no wheeze on forced expiration  Cardiovascular: RRR, heart sounds normal, no murmur or gallops, no peripheral edema  Musculoskeletal: No deformities, no cyanosis or clubbing  Neuro: alert, awake, non focal  Skin: Warm, no lesions or rash     Assessment & Plan:  Malignant neoplasm of upper lobe of right lung (HCC) Nodule consistent with squamous cell lung cancer, looks like stage I.  Planning for SBRT.  Surveillance follow-up imaging as per Dr. Jana Hakim and Dr. Isidore Moos.  Asthma Remote history of mild asthma.  She is not on maintenance bronchodilators and has not had flaring in many years.  Occasionally she needs to use her albuterol when allergies are active and with exertion outside.  She will keep this available and use as needed.  No maintenance therapy indicated  at this time  Allergic rhinitis Continue fluticasone nasal spray as she has been using it.  Baltazar Apo, MD, PhD 01/24/2021, 1:54 PM [facility] [phone] or if no answer before 7:00PM call [phone] For any issues after 7:00PM please call eLink [phone]

## 2021-01-24 NOTE — Patient Instructions (Signed)
Follow-up with Dr. Isidore Moos with radiation oncology for your therapy as planned. Get your follow-up surveillance CT scans of your chest as per Dr. Isidore Moos and Dr. Virgie Dad recommendations Keep your albuterol available use 2 puffs if needed for shortness of breath, chest tightness, wheezing. Continue your fluticasone nasal spray (Flonase), 2 sprays each nostril once daily. Follow with Dr. Lamonte Sakai in 12 months or sooner if you have any problems.

## 2021-01-24 NOTE — Progress Notes (Signed)
Patient ID: Alisha [name]                 DOB: [birth year]                      MRN: [id number]     HPI: Alisha [name] is a 86 y.o. female referred by Dr. Ellyn [name] to HTN clinic. PMH is significant for chronic bradycardia, diastolic dysfunction, HTN, Breast cancer w/ radiation, hypothyroidism, pre-DM.   She was last seen in HTN clinic for follow-up on 01/08/21. Her blood pressure was 134/60, BMP stable. Valsartan was increased to 320mg  daily. She unfortanetly was diagnosed with lung cancer recently. Has to undergo radiation. Already treated for breast cancer in the past.  Patient presents today for follow up. She has a little bit of dizziness today.Little bit of swelling, but did not need to take furosemide. No SOB. BP at pulmonology today was 120/70. She has 4 home BP readings, 3/4 at goal.    Home cuff previously compared to clinic reading 156/62 clinic right arm, Home left wrist-158/75.  Current HTN meds: chlorthalidone 25 mg daily (AM), amlodipine 5mg  daily, spironolactone 12.5mg  daily, valsartan 320mg  daily, PRN Lasix 20mg     Previously tried: lisinopril (low energy, itching), valsartan-HCTZ (low energy, itchiness), amlodipine 2.5mg  daily (fatique, dizziness, "skin issues"), spironolactone 25 mg daily (itching), losartan 12.5 mg daily (itching)  BP goal: <130/80  Family History: HTN in mother and father  Social History: Denies alcohol or smoking  Diet: Continues to work on her diet - enjoys vegetables, beans, chicken, fish, occasional homemade burger (1x/mo), salad - does not add salt to food  Drinks: 1 cup of coffee/day, 6 8-oz glasses of water per day   Exercise: Increased exercise to 30 minutes per day - using weights, exercise bike, and treadmill, treadmill (10 min each per day)  Home BP readings: 137/61,130/60,124/50,129/50  Wt Readings from Last 3 Encounters:  01/23/21 216 lb (98 kg)  12/21/20 220 lb (99.8 kg)  12/20/20 219 lb 14.4 oz (99.7 kg)   BP Readings  from Last 3 Encounters:  01/23/21 (!) 135/47  01/08/21 134/60  12/27/20 (!) 175/44   Pulse Readings from Last 3 Encounters:  01/23/21 (!) 55  01/08/21 (!) 54  12/27/20 (!) 54    Renal function: CrCl cannot be calculated (Patient's most recent lab result is older than the maximum 21 days allowed.).  Past Medical History:  Diagnosis Date  . Arthritis   . Asthma    Never smoker  . Blood transfusion    over 40 y ears  . Breast mass, left   . Cancer ([place])   . History of radiation therapy 09/07/19- 10/06/19   Left Breast 15 fractions of 2.67 Gy to total 40.05 Gy. Left breast boost 5 fractions of 2 Gy each to total 10 Gy  . Hypertension    on meds  . Hypothyroidism    on meds  . Pre-diabetes     Current Outpatient Medications on File Prior to Visit  Medication Sig Dispense Refill  . acetaminophen (TYLENOL) 500 MG tablet Take 1,000 mg by mouth every 6 (six) hours as needed (pain).    Marland Kitchen albuterol (PROVENTIL HFA;VENTOLIN HFA) 108 (90 Base) MCG/ACT inhaler Inhale 1-2 puffs into the lungs every 6 (six) hours as needed for wheezing or shortness of breath.     Marland Kitchen amLODipine (NORVASC) 5 MG tablet Take 1 tablet (5 mg total) by mouth daily. 90 tablet 3  . Artificial Tear Solution (  SOOTHE XP OP) Place 1 drop into both eyes in the morning and at bedtime.    . Biotin 5 MG TABS Take 5 mg by mouth daily at 12 noon.    . cetaphil (CETAPHIL) cream Apply 1 application topically as needed (dry skin).    . chlorthalidone (HYGROTON) 25 MG tablet Take 1 tablet (25 mg total) by mouth daily. 90 tablet 3  . diphenhydrAMINE HCl 50 MG/30ML LIQD Take 50 mg by mouth at bedtime as needed (sleep).    . fluticasone (FLONASE) 50 MCG/ACT nasal spray Place 2 sprays into both nostrils daily.    . furosemide (LASIX) 20 MG tablet Take 20 mg by mouth daily as needed for fluid or edema.    . gabapentin (NEURONTIN) 100 MG capsule Take 1 capsule (100 mg total) by mouth at bedtime.    Marland Kitchen levothyroxine (SYNTHROID,  LEVOTHROID) 88 MCG tablet Take 88 mcg by mouth daily before breakfast.     . meclizine (ANTIVERT) 25 MG tablet Take 12.5 mg by mouth 3 (three) times daily as needed for dizziness.    . Multiple Vitamins-Minerals (ICAPS AREDS 2 PO) Take 1 capsule by mouth 2 (two) times daily.     Marland Kitchen OVER THE COUNTER MEDICATION Take 300 mg by mouth daily at 12 noon. ADRENAL SUPPORT WITH ASHWAGANDHA    . potassium chloride SA (KLOR-CON) 20 MEQ tablet Take 20 mEq by mouth daily as needed (when taking furosemide).    Marland Kitchen spironolactone (ALDACTONE) 25 MG tablet Take 0.5 tablets (12.5 mg total) by mouth daily. 90 tablet 3  . valsartan (DIOVAN) 320 MG tablet Take 1 tablet (320 mg total) by mouth daily. 90 tablet 3   No current facility-administered medications on file prior to visit.    Allergies  Allergen Reactions  . Levofloxacin     Other reaction(s): Unknown  . Lisinopril Itching    No energy  . Penicillins Itching and Rash    Has patient had a PCN reaction causing immediate rash, facial/tongue/throat swelling, SOB or lightheadedness with hypotension: No Has patient had a PCN reaction causing severe rash involving mucus membranes or skin necrosis: No Has patient had a PCN reaction that required hospitalization: No Has patient had a PCN reaction occurring within the last 10 years: No If all of the above answers are "NO", then may proceed with Cephalosporin use.  . Sulfa Antibiotics Itching and Rash    Assessment/Plan:  1. Hypertension - Blood pressure at goal of <130/80 in clinic today. Continue chlorthalidone 25 mg daily (AM), amlodipine 5mg  daily, spironolactone 12.5mg  daily, valsartan 320mg  daily and Lasix 20mg  as needed for swelling. Will get a BMP today due to increasing valsartan last visit. Follow up in 1 month.  Alisha [name], Pharm.D, BCPS, CPP [facility]  [address] [place], [place] [address]  Phone: [phone]; Fax: [phone]

## 2021-01-24 NOTE — Assessment & Plan Note (Signed)
Continue fluticasone nasal spray as she has been using it.

## 2021-01-24 NOTE — Patient Instructions (Addendum)
Continue chlorthalidone 25 mg daily (AM), amlodipine 5mg  daily, spironolactone 12.5mg  daily, valsartan 320mg  daily, Lasix 20mg  as needed for swelling  Call me at [phone] with any questions  Keep checking blood pressure at home and exercising

## 2021-01-24 NOTE — Assessment & Plan Note (Signed)
Remote history of mild asthma.  She is not on maintenance bronchodilators and has not had flaring in many years.  Occasionally she needs to use her albuterol when allergies are active and with exertion outside.  She will keep this available and use as needed.  No maintenance therapy indicated at this time

## 2021-01-24 NOTE — Assessment & Plan Note (Signed)
Nodule consistent with squamous cell lung cancer, looks like stage I.  Planning for SBRT.  Surveillance follow-up imaging as per Dr. Jana Hakim and Dr. Isidore Moos.

## 2021-01-25 ENCOUNTER — Telehealth: Payer: Self-pay | Admitting: Pharmacist

## 2021-01-25 LAB — BASIC METABOLIC PANEL
BUN/Creatinine Ratio: 28 (ref 12–28)
BUN: 25 mg/dL (ref 8–27)
CO2: 16 mmol/L — ABNORMAL LOW (ref 20–29)
Calcium: 9.5 mg/dL (ref 8.7–10.3)
Chloride: 103 mmol/L (ref 96–106)
Creatinine, Ser: 0.88 mg/dL (ref 0.57–1.00)
Glucose: 191 mg/dL — ABNORMAL HIGH (ref 65–99)
Potassium: 4 mmol/L (ref 3.5–5.2)
Sodium: 141 mmol/L (ref 134–144)
eGFR: 65 mL/min/{1.73_m2} (ref >59)

## 2021-01-25 NOTE — Telephone Encounter (Signed)
BMP stable after increasing valsartan to 320mg  daily. Continue chlorthalidone 25 mg daily (AM), amlodipine 5mg  daily, spironolactone 12.5mg  daily, valsartan 320mg  daily, PRN Lasix 20mg  Patient called and made aware of results. Follow up in clinic in May

## 2021-01-28 ENCOUNTER — Ambulatory Visit
Admission: RE | Admit: 2021-01-28 | Discharge: 2021-01-28 | Disposition: A | Discharge status: Home / Self Care | Payer: Self-pay | Source: Ambulatory Visit | Attending: Radiation Oncology | Admitting: Radiation Oncology

## 2021-01-28 ENCOUNTER — Telehealth: Payer: Self-pay | Admitting: *Deleted

## 2021-01-28 ENCOUNTER — Other Ambulatory Visit: Payer: Self-pay | Admitting: Radiation Oncology

## 2021-01-28 DIAGNOSIS — C3411 Malignant neoplasm of upper lobe, right bronchus or lung: Secondary | ICD-10-CM

## 2021-01-28 NOTE — Telephone Encounter (Signed)
Called patient to inform of Covid testing for 02-01-21 , and her PFT for 02-04-21 - arrival time - 10:45 am @ [facility], patient to report to Admitting , patient to not use inhalers, neublizers or have any caffefine , and she also shouldn't smoke, patient to have sim on April 20, lvm for a return call

## 2021-02-01 ENCOUNTER — Other Ambulatory Visit (HOSPITAL_COMMUNITY)
Admission: RE | Admit: 2021-02-01 | Discharge: 2021-02-01 | Disposition: A | Discharge status: Home / Self Care | Payer: Medicare HMO | Source: Ambulatory Visit | Attending: Radiation Oncology | Admitting: Radiation Oncology

## 2021-02-01 DIAGNOSIS — Z20822 Contact with and (suspected) exposure to covid-19: Secondary | ICD-10-CM | POA: Diagnosis not present

## 2021-02-01 DIAGNOSIS — Z01812 Encounter for preprocedural laboratory examination: Secondary | ICD-10-CM | POA: Insufficient documentation

## 2021-02-01 LAB — SARS CORONAVIRUS 2 (TAT 6-24 HRS): SARS Coronavirus 2: NEGATIVE

## 2021-02-03 ENCOUNTER — Ambulatory Visit: Payer: Medicare HMO

## 2021-02-04 ENCOUNTER — Ambulatory Visit: Payer: Medicare HMO | Admitting: Radiation Oncology

## 2021-02-04 ENCOUNTER — Ambulatory Visit (HOSPITAL_COMMUNITY)
Admission: RE | Admit: 2021-02-04 | Discharge: 2021-02-04 | Disposition: A | Discharge status: Home / Self Care | Payer: Medicare HMO | Source: Ambulatory Visit | Attending: Radiation Oncology | Admitting: Radiation Oncology

## 2021-02-04 ENCOUNTER — Ambulatory Visit: Payer: Medicare HMO

## 2021-02-04 DIAGNOSIS — C3411 Malignant neoplasm of upper lobe, right bronchus or lung: Secondary | ICD-10-CM | POA: Diagnosis not present

## 2021-02-04 LAB — PULMONARY FUNCTION TEST
DL/VA % pred: 104 %
DL/VA: 4.2 ml/min/mmHg/L
DLCO unc % pred: 77 %
DLCO unc: 14.57 ml/min/mmHg
FEF 25-75 Post: 1.98 L/sec
FEF 25-75 Pre: 1.69 L/sec
FEF2575-%Change-Post: 17 %
FEF2575-%Pred-Post: 165 %
FEF2575-%Pred-Pre: 141 %
FEV1-%Change-Post: 3 %
FEV1-%Pred-Post: 98 %
FEV1-%Pred-Pre: 95 %
FEV1-Post: 1.46 L
FEV1-Pre: 1.42 L
FEV1FVC-%Change-Post: 3 %
FEV1FVC-%Pred-Pre: 113 %
FEV6-%Change-Post: 0 %
FEV6-%Pred-Post: 90 %
FEV6-%Pred-Pre: 91 %
FEV6-Post: 1.67 L
FEV6-Pre: 1.68 L
FEV6FVC-%Pred-Post: 104 %
FEV6FVC-%Pred-Pre: 104 %
FVC-%Change-Post: 0 %
FVC-%Pred-Post: 86 %
FVC-%Pred-Pre: 86 %
FVC-Post: 1.67 L
FVC-Pre: 1.68 L
Post FEV1/FVC ratio: 88 %
Post FEV6/FVC ratio: 100 %
Pre FEV1/FVC ratio: 85 %
Pre FEV6/FVC Ratio: 100 %
RV % pred: 81 %
RV: 2.04 L
TLC % pred: 75 %
TLC: 3.85 L

## 2021-02-04 MED ORDER — ALBUTEROL SULFATE (2.5 MG/3ML) 0.083% IN NEBU
2.5000 mg | INHALATION_SOLUTION | Freq: Once | RESPIRATORY_TRACT | Status: AC
  Administered 2021-02-04: 2.5 mg via RESPIRATORY_TRACT

## 2021-02-05 ENCOUNTER — Telehealth: Payer: Self-pay | Admitting: Radiation Oncology

## 2021-02-05 NOTE — Telephone Encounter (Signed)
Called and left a voicemail for the patient reminding her of the [facility] appt for Dr. Isidore Moos tomorrow, 4/20 @ 9:45a arrival time.

## 2021-02-06 ENCOUNTER — Ambulatory Visit
Admission: RE | Admit: 2021-02-06 | Discharge: 2021-02-06 | Disposition: A | Discharge status: Home / Self Care | Payer: Medicare HMO | Source: Ambulatory Visit | Attending: Radiation Oncology | Admitting: Radiation Oncology

## 2021-02-06 ENCOUNTER — Ambulatory Visit: Payer: Medicare HMO

## 2021-02-06 ENCOUNTER — Other Ambulatory Visit: Payer: Self-pay

## 2021-02-06 DIAGNOSIS — C50412 Malignant neoplasm of upper-outer quadrant of left female breast: Secondary | ICD-10-CM | POA: Insufficient documentation

## 2021-02-06 DIAGNOSIS — Z510 Encounter for antineoplastic radiation therapy: Secondary | ICD-10-CM | POA: Diagnosis not present

## 2021-02-06 DIAGNOSIS — Z171 Estrogen receptor negative status [ER-]: Secondary | ICD-10-CM | POA: Insufficient documentation

## 2021-02-06 DIAGNOSIS — C3411 Malignant neoplasm of upper lobe, right bronchus or lung: Secondary | ICD-10-CM | POA: Diagnosis not present

## 2021-02-06 DIAGNOSIS — Z77098 Contact with and (suspected) exposure to other hazardous, chiefly nonmedicinal, chemicals: Secondary | ICD-10-CM | POA: Diagnosis not present

## 2021-02-08 DIAGNOSIS — C50412 Malignant neoplasm of upper-outer quadrant of left female breast: Secondary | ICD-10-CM | POA: Diagnosis not present

## 2021-02-08 DIAGNOSIS — Z510 Encounter for antineoplastic radiation therapy: Secondary | ICD-10-CM | POA: Diagnosis not present

## 2021-02-08 DIAGNOSIS — C3411 Malignant neoplasm of upper lobe, right bronchus or lung: Secondary | ICD-10-CM | POA: Diagnosis not present

## 2021-02-08 DIAGNOSIS — Z77098 Contact with and (suspected) exposure to other hazardous, chiefly nonmedicinal, chemicals: Secondary | ICD-10-CM | POA: Diagnosis not present

## 2021-02-08 DIAGNOSIS — Z171 Estrogen receptor negative status [ER-]: Secondary | ICD-10-CM | POA: Diagnosis not present

## 2021-02-13 ENCOUNTER — Ambulatory Visit
Admission: RE | Admit: 2021-02-13 | Discharge: 2021-02-13 | Disposition: A | Discharge status: Home / Self Care | Payer: Medicare HMO | Source: Ambulatory Visit | Attending: Radiation Oncology | Admitting: Radiation Oncology

## 2021-02-13 ENCOUNTER — Ambulatory Visit: Payer: Medicare HMO | Admitting: Radiation Oncology

## 2021-02-13 ENCOUNTER — Other Ambulatory Visit: Payer: Self-pay

## 2021-02-13 DIAGNOSIS — Z171 Estrogen receptor negative status [ER-]: Secondary | ICD-10-CM | POA: Diagnosis not present

## 2021-02-13 DIAGNOSIS — C50412 Malignant neoplasm of upper-outer quadrant of left female breast: Secondary | ICD-10-CM | POA: Diagnosis not present

## 2021-02-13 DIAGNOSIS — Z510 Encounter for antineoplastic radiation therapy: Secondary | ICD-10-CM | POA: Diagnosis not present

## 2021-02-14 ENCOUNTER — Ambulatory Visit: Payer: Medicare HMO | Admitting: Radiation Oncology

## 2021-02-15 ENCOUNTER — Ambulatory Visit
Admission: RE | Admit: 2021-02-15 | Discharge: 2021-02-15 | Disposition: A | Discharge status: Home / Self Care | Payer: Medicare HMO | Source: Ambulatory Visit | Attending: Radiation Oncology | Admitting: Radiation Oncology

## 2021-02-15 ENCOUNTER — Other Ambulatory Visit: Payer: Self-pay

## 2021-02-15 ENCOUNTER — Ambulatory Visit: Payer: Medicare HMO | Admitting: Radiation Oncology

## 2021-02-15 DIAGNOSIS — Z510 Encounter for antineoplastic radiation therapy: Secondary | ICD-10-CM | POA: Diagnosis not present

## 2021-02-15 DIAGNOSIS — C50412 Malignant neoplasm of upper-outer quadrant of left female breast: Secondary | ICD-10-CM | POA: Diagnosis not present

## 2021-02-15 DIAGNOSIS — Z171 Estrogen receptor negative status [ER-]: Secondary | ICD-10-CM | POA: Diagnosis not present

## 2021-02-20 ENCOUNTER — Other Ambulatory Visit: Payer: Self-pay

## 2021-02-20 ENCOUNTER — Encounter: Payer: Self-pay | Admitting: Radiation Oncology

## 2021-02-20 ENCOUNTER — Ambulatory Visit
Admission: RE | Admit: 2021-02-20 | Discharge: 2021-02-20 | Disposition: A | Discharge status: Home / Self Care | Payer: Medicare HMO | Source: Ambulatory Visit | Attending: Radiation Oncology | Admitting: Radiation Oncology

## 2021-02-20 DIAGNOSIS — Z171 Estrogen receptor negative status [ER-]: Secondary | ICD-10-CM | POA: Diagnosis not present

## 2021-02-20 DIAGNOSIS — C50412 Malignant neoplasm of upper-outer quadrant of left female breast: Secondary | ICD-10-CM | POA: Diagnosis not present

## 2021-02-20 DIAGNOSIS — Z77098 Contact with and (suspected) exposure to other hazardous, chiefly nonmedicinal, chemicals: Secondary | ICD-10-CM | POA: Diagnosis not present

## 2021-02-20 DIAGNOSIS — C3411 Malignant neoplasm of upper lobe, right bronchus or lung: Secondary | ICD-10-CM | POA: Diagnosis not present

## 2021-02-20 DIAGNOSIS — Z510 Encounter for antineoplastic radiation therapy: Secondary | ICD-10-CM | POA: Insufficient documentation

## 2021-02-21 ENCOUNTER — Ambulatory Visit (INDEPENDENT_AMBULATORY_CARE_PROVIDER_SITE_OTHER): Payer: Medicare HMO | Admitting: Pharmacist

## 2021-02-21 VITALS — BP 150/52 | HR 53

## 2021-02-21 DIAGNOSIS — I10 Essential (primary) hypertension: Secondary | ICD-10-CM

## 2021-02-21 MED ORDER — SPIRONOLACTONE 25 MG PO TABS: 25.0000 mg | ORAL_TABLET | Freq: Every day | ORAL | 3 refills | Status: DC

## 2021-02-21 NOTE — Patient Instructions (Addendum)
Its always a pleasure to see you  Please increase spironolactone to 25mg  daily  Continue chlorthalidone 25 mg daily (AM), amlodipine 5mg  daily and valsartan 320mg  daily  Please call me at [phone] with any questions

## 2021-02-21 NOTE — Progress Notes (Signed)
Patient ID: Alisha [name]                 DOB: 20-Oct-1935                      MRN: [id number]     HPI: Alisha [name] is a 86 y.o. female referred by [name] to HTN clinic. PMH is significant for chronic bradycardia, diastolic dysfunction, HTN, Breast cancer w/ radiation, hypothyroidism, pre-DM.   She was last seen in HTN clinic for follow-up on 01/24/21. Her blood pressure was 120/50, BMP stable. No changes were made. She unfortanetly was diagnosed with lung cancer recently. Has to undergo radiation. Already treated for breast cancer in the past.  Patient presents today for follow up. She just completed her radiation cycle. Feeling very fatigued. Has some minor dizziness. Trying to stay well hydrated. Denies headaches/blurred vision, SOB or swelling. No swelling on exam. Blood pressure at home has been in the 140's. Hasn't exercised as much lately due to fatigue. Trying to increase.   Home cuff previously compared to clinic reading 156/62 clinic right arm, Home left wrist-158/75.  Current HTN meds: chlorthalidone 25 mg daily (AM), amlodipine 5mg  daily, spironolactone 12.5mg  daily, valsartan 320mg  daily, PRN Lasix 20mg     Previously tried: lisinopril (low energy, itching), valsartan-HCTZ (low energy, itchiness), amlodipine 2.5mg  daily (fatique, dizziness, "skin issues"), spironolactone 25 mg daily (itching), losartan 12.5 mg daily (itching)  BP goal: <130/80  Family History: HTN in mother and father  Social History: Denies alcohol or smoking  Diet: Continues to work on her diet - enjoys vegetables, beans, chicken, fish, occasional homemade burger (1x/mo), salad - does not add salt to food  Drinks: 1 cup of coffee/day, 6 8-oz glasses of water per day   Exercise: Increased exercise to 30 minutes per day - using weights, exercise bike, and treadmill, treadmill (10 min each per day)  Home BP readings: 140's  Wt Readings from Last 3 Encounters:  01/24/21 216 lb 9.6 oz (98.2  kg)  01/23/21 216 lb (98 kg)  12/21/20 220 lb (99.8 kg)   BP Readings from Last 3 Encounters:  01/24/21 (!) 120/50  01/24/21 120/70  01/23/21 (!) 135/47   Pulse Readings from Last 3 Encounters:  01/24/21 (!) 57  01/24/21 (!) 55  01/23/21 (!) 55    Renal function: CrCl cannot be calculated (Patient's most recent lab result is older than the maximum 21 days allowed.).  Past Medical History:  Diagnosis Date  . Arthritis   . Asthma    Never smoker  . Blood transfusion    over 40 y ears  . Breast mass, left   . Cancer ([place])   . History of radiation therapy 09/07/19- 10/06/19   Left Breast 15 fractions of 2.67 Gy to total 40.05 Gy. Left breast boost 5 fractions of 2 Gy each to total 10 Gy  . Hypertension    on meds  . Hypothyroidism    on meds  . Pre-diabetes     Current Outpatient Medications on File Prior to Visit  Medication Sig Dispense Refill  . acetaminophen (TYLENOL) 500 MG tablet Take 1,000 mg by mouth every 6 (six) hours as needed (pain).    Marland Kitchen albuterol (PROVENTIL HFA;VENTOLIN HFA) 108 (90 Base) MCG/ACT inhaler Inhale 1-2 puffs into the lungs every 6 (six) hours as needed for wheezing or shortness of breath.     Marland Kitchen amLODipine (NORVASC) 5 MG tablet Take 1 tablet (5 mg total) by  mouth daily. 90 tablet 3  . Artificial Tear Solution (SOOTHE XP OP) Place 1 drop into both eyes in the morning and at bedtime.    . Biotin 5 MG TABS Take 5 mg by mouth daily at 12 noon.    . cetaphil (CETAPHIL) cream Apply 1 application topically as needed (dry skin).    . chlorthalidone (HYGROTON) 25 MG tablet Take 1 tablet (25 mg total) by mouth daily. 90 tablet 3  . diphenhydrAMINE HCl 50 MG/30ML LIQD Take 50 mg by mouth at bedtime as needed (sleep).    . fluticasone (FLONASE) 50 MCG/ACT nasal spray Place 2 sprays into both nostrils daily.    . furosemide (LASIX) 20 MG tablet Take 20 mg by mouth daily as needed for fluid or edema.    . gabapentin (NEURONTIN) 100 MG capsule Take 1 capsule  (100 mg total) by mouth at bedtime.    Marland Kitchen levothyroxine (SYNTHROID, LEVOTHROID) 88 MCG tablet Take 88 mcg by mouth daily before breakfast.     . meclizine (ANTIVERT) 25 MG tablet Take 12.5 mg by mouth 3 (three) times daily as needed for dizziness.    . Multiple Vitamins-Minerals (ICAPS AREDS 2 PO) Take 1 capsule by mouth 2 (two) times daily.     Marland Kitchen OVER THE COUNTER MEDICATION Take 300 mg by mouth daily at 12 noon. ADRENAL SUPPORT WITH ASHWAGANDHA    . potassium chloride SA (KLOR-CON) 20 MEQ tablet Take 20 mEq by mouth daily as needed (when taking furosemide).    Marland Kitchen spironolactone (ALDACTONE) 25 MG tablet Take 0.5 tablets (12.5 mg total) by mouth daily. 90 tablet 3  . valsartan (DIOVAN) 320 MG tablet Take 1 tablet (320 mg total) by mouth daily. 90 tablet 3   No current facility-administered medications on file prior to visit.    Allergies  Allergen Reactions  . Levofloxacin     Other reaction(s): Unknown  . Lisinopril Itching    No energy  . Penicillins Itching and Rash    Has patient had a PCN reaction causing immediate rash, facial/tongue/throat swelling, SOB or lightheadedness with hypotension: No Has patient had a PCN reaction causing severe rash involving mucus membranes or skin necrosis: No Has patient had a PCN reaction that required hospitalization: No Has patient had a PCN reaction occurring within the last 10 years: No If all of the above answers are "NO", then may proceed with Cephalosporin use.  . Sulfa Antibiotics Itching and Rash    Assessment/Plan:  1. Hypertension - Blood pressure above goal of <130/80 in clinic today. Will increase spironolactone to 25mg  daily. Continue chlorthalidone 25 mg daily (AM), amlodipine 5mg  daily,  valsartan 320mg  daily and Lasix 20mg  as needed for swelling. Follow up in 2 weeks.  Ramond Dial, Pharm.D, BCPS, CPP [facility]  [address] [place], [place] [address]  Phone: [phone]; Fax: [phone]

## 2021-03-04 ENCOUNTER — Ambulatory Visit: Payer: Medicare HMO | Admitting: Podiatry

## 2021-03-04 ENCOUNTER — Other Ambulatory Visit: Payer: Self-pay

## 2021-03-04 DIAGNOSIS — M79675 Pain in left toe(s): Secondary | ICD-10-CM

## 2021-03-04 DIAGNOSIS — M79674 Pain in right toe(s): Secondary | ICD-10-CM

## 2021-03-04 DIAGNOSIS — G629 Polyneuropathy, unspecified: Secondary | ICD-10-CM | POA: Diagnosis not present

## 2021-03-04 DIAGNOSIS — L84 Corns and callosities: Secondary | ICD-10-CM

## 2021-03-04 DIAGNOSIS — B351 Tinea unguium: Secondary | ICD-10-CM | POA: Diagnosis not present

## 2021-03-04 NOTE — Patient Instructions (Signed)
Recommend Skechers Loafers with stretchable uppers and memory foam insoles. They can be purchased at [employer].  You may also purchase them online at [email or web address].

## 2021-03-08 ENCOUNTER — Other Ambulatory Visit: Payer: Self-pay

## 2021-03-08 ENCOUNTER — Ambulatory Visit (INDEPENDENT_AMBULATORY_CARE_PROVIDER_SITE_OTHER): Payer: Medicare HMO | Admitting: Pharmacist

## 2021-03-08 VITALS — BP 112/50 | HR 47

## 2021-03-08 DIAGNOSIS — I10 Essential (primary) hypertension: Secondary | ICD-10-CM | POA: Diagnosis not present

## 2021-03-08 MED ORDER — AMLODIPINE BESYLATE 5 MG PO TABS: 5.0000 mg | ORAL_TABLET | Freq: Every day | ORAL | 3 refills | Status: DC

## 2021-03-08 NOTE — Patient Instructions (Signed)
Please look at your medications at home and call me with what blood pressure medications you are taking [phone]

## 2021-03-08 NOTE — Progress Notes (Signed)
Patient ID: Alisha [name]                 DOB: 10/28/1934                      MRN: [id number]     HPI: Alisha [name] is a 86 y.o. female referred by [name] to HTN clinic. PMH is significant for chronic bradycardia, diastolic dysfunction, HTN, Breast cancer w/ radiation, hypothyroidism, pre-DM.   She was last seen in HTN clinic for follow-up on 02/21/2021. Her blood pressure was 150/52, BMP stable. Spironolactone was increased to 25mg  daily . She unfortanetly was diagnosed with lung cancer recently. Underwent radiation a few weeks ago which took a toll on her energy. Already treated for breast cancer in the past.  Patient presents today for follow up. When reviewing her medications, it was discovered that she was not taking amlodipine. She reports blood pressures in the 150's at home. But clinic reading today was 112/50 HR 47. She is not on any rate medications. She denies any dizziness/light headedness. States last week she exercised everyday and felt great. Dragging a little this week. She complains of going to bathroom every hour at night since increasing spironolactone.  Home cuff previously compared to clinic reading 156/62 clinic right arm, Home left wrist-158/75.  Current HTN meds: chlorthalidone 25 mg daily (AM), amlodipine 5mg  daily (has not been taking), spironolactone 25mg  daily, valsartan 320mg  daily, PRN Lasix 20mg     Previously tried: lisinopril (low energy, itching), valsartan-HCTZ (low energy, itchiness), amlodipine 2.5mg  daily (fatique, dizziness, "skin issues"), spironolactone 25 mg daily (itching), losartan 12.5 mg daily (itching)  BP goal: <130/80  Family History: HTN in mother and father  Social History: Denies alcohol or smoking  Diet: Continues to work on her diet - enjoys vegetables, beans, chicken, fish, occasional homemade burger (1x/mo), salad - does not add salt to food  Drinks: 1 cup of coffee/day, 6 8-oz glasses of water per day   Exercise:  Increased exercise to 30 minutes per day - using weights, exercise bike, and treadmill, treadmill (10 min each per day)  Home BP readings: 154-160/72-74  Wt Readings from Last 3 Encounters:  01/24/21 216 lb 9.6 oz (98.2 kg)  01/23/21 216 lb (98 kg)  12/21/20 220 lb (99.8 kg)   BP Readings from Last 3 Encounters:  02/21/21 (!) 150/52  01/24/21 (!) 120/50  01/24/21 120/70   Pulse Readings from Last 3 Encounters:  02/21/21 (!) 53  01/24/21 (!) 57  01/24/21 (!) 55    Renal function: CrCl cannot be calculated (Patient's most recent lab result is older than the maximum 21 days allowed.).  Past Medical History:  Diagnosis Date  . Arthritis   . Asthma    Never smoker  . Blood transfusion    over 40 y ears  . Breast mass, left   . Cancer ([place])   . History of radiation therapy 09/07/19- 10/06/19   Left Breast 15 fractions of 2.67 Gy to total 40.05 Gy. Left breast boost 5 fractions of 2 Gy each to total 10 Gy  . Hypertension    on meds  . Hypothyroidism    on meds  . Pre-diabetes     Current Outpatient Medications on File Prior to Visit  Medication Sig Dispense Refill  . acetaminophen (TYLENOL) 500 MG tablet Take 1,000 mg by mouth every 6 (six) hours as needed (pain).    Marland Kitchen albuterol (PROVENTIL HFA;VENTOLIN HFA) 108 (90 Base) MCG/ACT  inhaler Inhale 1-2 puffs into the lungs every 6 (six) hours as needed for wheezing or shortness of breath.     Marland Kitchen amLODipine (NORVASC) 5 MG tablet Take 1 tablet (5 mg total) by mouth daily. 90 tablet 3  . Artificial Tear Solution (SOOTHE XP OP) Place 1 drop into both eyes in the morning and at bedtime.    . Biotin 5 MG TABS Take 5 mg by mouth daily at 12 noon.    . cetaphil (CETAPHIL) cream Apply 1 application topically as needed (dry skin).    . chlorthalidone (HYGROTON) 25 MG tablet Take 1 tablet (25 mg total) by mouth daily. 90 tablet 3  . diphenhydrAMINE HCl 50 MG/30ML LIQD Take 50 mg by mouth at bedtime as needed (sleep).    . fluticasone  (FLONASE) 50 MCG/ACT nasal spray Place 2 sprays into both nostrils daily.    . furosemide (LASIX) 20 MG tablet Take 20 mg by mouth daily as needed for fluid or edema.    . gabapentin (NEURONTIN) 100 MG capsule Take 1 capsule (100 mg total) by mouth at bedtime.    Marland Kitchen levothyroxine (SYNTHROID, LEVOTHROID) 88 MCG tablet Take 88 mcg by mouth daily before breakfast.     . meclizine (ANTIVERT) 25 MG tablet Take 12.5 mg by mouth 3 (three) times daily as needed for dizziness.    . Multiple Vitamins-Minerals (ICAPS AREDS 2 PO) Take 1 capsule by mouth 2 (two) times daily.     Marland Kitchen OVER THE COUNTER MEDICATION Take 300 mg by mouth daily at 12 noon. ADRENAL SUPPORT WITH ASHWAGANDHA    . potassium chloride SA (KLOR-CON) 20 MEQ tablet Take 20 mEq by mouth daily as needed (when taking furosemide).    Marland Kitchen spironolactone (ALDACTONE) 25 MG tablet Take 1 tablet (25 mg total) by mouth daily. 90 tablet 3  . valsartan (DIOVAN) 320 MG tablet Take 1 tablet (320 mg total) by mouth daily. 90 tablet 3   No current facility-administered medications on file prior to visit.    Allergies  Allergen Reactions  . Levofloxacin     Other reaction(s): Unknown  . Lisinopril Itching    No energy  . Penicillins Itching and Rash    Has patient had a PCN reaction causing immediate rash, facial/tongue/throat swelling, SOB or lightheadedness with hypotension: No Has patient had a PCN reaction causing severe rash involving mucus membranes or skin necrosis: No Has patient had a PCN reaction that required hospitalization: No Has patient had a PCN reaction occurring within the last 10 years: No If all of the above answers are "NO", then may proceed with Cephalosporin use.  . Sulfa Antibiotics Itching and Rash    Assessment/Plan:  1. Hypertension - Blood pressure at goal of <130/80 in clinic today. Reports home readings are much higher. Patient has not been taking amlodipine, not sure for how long now. Will decrease spironolactone to 12.5mg   due to increased urination that is affecting her sleep. Will add back amlodipine 5mg  daily. Continue checking blood pressure at home. Follow up in 3 weeks in clinic.  Ramond Dial, Pharm.D, BCPS, CPP [facility]  [address] [place], [place] [address]  Phone: [phone]; Fax: [phone]

## 2021-03-09 ENCOUNTER — Encounter: Payer: Self-pay | Admitting: Podiatry

## 2021-03-09 NOTE — Progress Notes (Signed)
  Subjective:  Patient ID: Alisha [name], female    DOB: November 11, 1934,  MRN: [id number]  Alisha [name] presents to clinic today for painful thick toenails that are difficult to trim. Pain interferes with ambulation. Aggravating factors include wearing enclosed shoe gear. Pain is relieved with periodic professional debridement.   She relates discomfort with hammertoes of left foot digits 1, 2,3. Denies any recent trauma. Toes are painful with certain shoe gear.  PCP is Dr. [name] and last visit was 12/11/2020.  Allergies  Allergen Reactions  . Levofloxacin     Other reaction(s): Unknown  . Lisinopril Itching    No energy  . Penicillins Itching and Rash    Has patient had a PCN reaction causing immediate rash, facial/tongue/throat swelling, SOB or lightheadedness with hypotension: No Has patient had a PCN reaction causing severe rash involving mucus membranes or skin necrosis: No Has patient had a PCN reaction that required hospitalization: No Has patient had a PCN reaction occurring within the last 10 years: No If all of the above answers are "NO", then may proceed with Cephalosporin use.  . Sulfa Antibiotics Itching and Rash    Review of Systems: Negative except as noted in the HPI. Objective:   Constitutional Alisha [name] is a pleasant 86 y.o. African American female, in NAD. AAO x 3.   Vascular Capillary fill time to digits <3 seconds b/l lower extremities. Palpable pedal pulses b/l LE. Pedal hair sparse. Lower extremity skin temperature gradient within normal limits. No pain with calf compression b/l. No cyanosis or clubbing noted.  Neurologic Normal speech. Oriented to person, place, and time. Pt has subjective symptoms of neuropathy. Protective sensation decreased with 10 gram monofilament b/l.  Dermatologic Pedal skin with normal turgor, texture and tone bilaterally. No open wounds bilaterally. No interdigital macerations bilaterally. Toenails 1-5 b/l  elongated, discolored, dystrophic, thickened, crumbly with subungual debris and tenderness to dorsal palpation. Hyperkeratotic lesion(s) R hallux, submet head 5 left foot and submet head 5 right foot.  No erythema, no edema, no drainage, no fluctuance.  Orthopedic: Normal muscle strength 5/5 to all lower extremity muscle groups bilaterally. No pain crepitus or joint limitation noted with ROM b/l. Hammertoe(s) noted to the L hallux, L 2nd toe and L 3rd toe.   Radiographs: None Assessment:   1. Pain due to onychomycosis of toenails of both feet   2. Callus   3. Neuropathy    Plan:  Patient was evaluated and treated and all questions answered.  Onychomycosis with pain -Nails palliatively debridement as below -Educated on self-care  Procedure: Nail Debridement Rationale: Pain Type of Debridement: manual, sharp debridement. Instrumentation: Nail nipper, rotary burr. Number of Nails: 10 -Examined patient. -Patient to continue soft, supportive shoe gear daily. -Toenails 1-5 b/l were debrided in length and girth with sterile nail nippers and dremel without iatrogenic bleeding.  -Callus(es) R hallux, submet head 5 left foot and submet head 5 right foot pared utilizing sterile scalpel blade without complication or incident. Total number debrided =3. -Patient to report any pedal injuries to medical professional immediately. -Recommended Skechers shoes with stretchable uppers and memory foam insoles which may be purchased at [employer] or online. -Patient/POA to call should there be question/concern in the interim.  Return in about 3 months (around 06/04/2021).  Marzetta Board, DPM

## 2021-03-28 ENCOUNTER — Ambulatory Visit (INDEPENDENT_AMBULATORY_CARE_PROVIDER_SITE_OTHER): Payer: Medicare HMO | Admitting: Pharmacist

## 2021-03-28 ENCOUNTER — Other Ambulatory Visit: Payer: Self-pay

## 2021-03-28 VITALS — BP 132/50 | HR 49

## 2021-03-28 DIAGNOSIS — I10 Essential (primary) hypertension: Secondary | ICD-10-CM

## 2021-03-28 MED ORDER — AMLODIPINE BESYLATE 10 MG PO TABS: 10.0000 mg | ORAL_TABLET | Freq: Every day | ORAL | 3 refills | Status: DC

## 2021-03-28 NOTE — Progress Notes (Signed)
Patient ID: Alisha [name]                 DOB: [birth year]                      MRN: [id number]     HPI: Alisha [name] is a 86 y.o. female referred by Dr. Ellyn [name] to HTN clinic. PMH is significant for chronic bradycardia, diastolic dysfunction, HTN, Breast cancer w/ radiation, lung cancer s/p radiation, hypothyroidism, pre-DM.   She was last seen in HTN clinic for follow-up on 03/08/2021. She complained of increased urinary frequency at night with increased spironolactone dose. It was discovered that she was not taking her amlodipine. Therefore spironolactone was decreased back to 12.5mg  daily and amlodipine 5mg  was resumed.  Patient presents today for follow up. She has had a few episodes of vertigo, but otherwise denies dizziness, lightheadedness, headache, blurred vision, SOB or swelling. She decreased spironolactone to 12.5mg  on Sunday. Has not noticed any difference in urination. States she does not urinate a lot during the day, but wakes up almost every hour at night. States it started when she started spironolactone. Her energy level is hit or miss. Sometimes she has a lot of energy other days not much. Still exercising on and off. She brings in a few blood pressure readings. Most recent one from 6/1.  Home cuff previously compared to clinic reading 156/62 clinic right arm, Home left wrist-158/75.  Current HTN meds: chlorthalidone 25 mg daily (AM), amlodipine 5mg  daily, spironolactone 12.5mg  daily, valsartan 320mg  daily, PRN Lasix 20mg     Previously tried: lisinopril (low energy, itching), valsartan-HCTZ (low energy, itchiness), amlodipine 2.5mg  daily (fatique, dizziness, "skin issues"), spironolactone 25 mg daily (itching, increased urinary frequency at night)), losartan 12.5 mg daily (itching)  BP goal: <130/80  Family History: HTN in mother and father  Social History: Denies alcohol or smoking  Diet: Continues to work on her diet - enjoys vegetables, beans, chicken, fish,  occasional homemade burger (1x/mo), salad - does not add salt to food  Drinks: 1 cup of coffee/day, 6 8-oz glasses of water per day   Exercise: Increased exercise to 30 minutes per day - using weights, exercise bike, and treadmill, treadmill (10 min each per day)  Home BP readings: 135/63, 108/50, 133/55, 146/54 (these readings were taking 25mg  of spironolactone)  Wt Readings from Last 3 Encounters:  01/24/21 216 lb 9.6 oz (98.2 kg)  01/23/21 216 lb (98 kg)  12/21/20 220 lb (99.8 kg)   BP Readings from Last 3 Encounters:  03/08/21 (!) 112/50  02/21/21 (!) 150/52  01/24/21 (!) 120/50   Pulse Readings from Last 3 Encounters:  03/08/21 (!) 47  02/21/21 (!) 53  01/24/21 (!) 57    Renal function: CrCl cannot be calculated (Patient's most recent lab result is older than the maximum 21 days allowed.).  Past Medical History:  Diagnosis Date   Arthritis    Asthma    Never smoker   Blood transfusion    over 40 y ears   Breast mass, left    Cancer [facility])    History of radiation therapy 09/07/19- 10/06/19   Left Breast 15 fractions of 2.67 Gy to total 40.05 Gy. Left breast boost 5 fractions of 2 Gy each to total 10 Gy   Hypertension    on meds   Hypothyroidism    on meds   Pre-diabetes     Current Outpatient Medications on File Prior to Visit  Medication Sig  Dispense Refill   acetaminophen (TYLENOL) 500 MG tablet Take 1,000 mg by mouth every 6 (six) hours as needed (pain).     albuterol (PROVENTIL HFA;VENTOLIN HFA) 108 (90 Base) MCG/ACT inhaler Inhale 1-2 puffs into the lungs every 6 (six) hours as needed for wheezing or shortness of breath.      amLODipine (NORVASC) 5 MG tablet Take 1 tablet (5 mg total) by mouth daily. 90 tablet 3   Artificial Tear Solution (SOOTHE XP OP) Place 1 drop into both eyes in the morning and at bedtime.     Biotin 5 MG TABS Take 5 mg by mouth daily at 12 noon.     cetaphil (CETAPHIL) cream Apply 1 application topically as needed (dry skin).      chlorthalidone (HYGROTON) 25 MG tablet Take 1 tablet (25 mg total) by mouth daily. 90 tablet 3   diphenhydrAMINE HCl 50 MG/30ML LIQD Take 50 mg by mouth at bedtime as needed (sleep).     fluticasone (FLONASE) 50 MCG/ACT nasal spray Place 2 sprays into both nostrils daily.     furosemide (LASIX) 20 MG tablet Take 20 mg by mouth daily as needed for fluid or edema.     gabapentin (NEURONTIN) 100 MG capsule Take 1 capsule (100 mg total) by mouth at bedtime.     levothyroxine (SYNTHROID, LEVOTHROID) 88 MCG tablet Take 88 mcg by mouth daily before breakfast.      meclizine (ANTIVERT) 25 MG tablet Take 12.5 mg by mouth 3 (three) times daily as needed for dizziness.     Multiple Vitamins-Minerals (ICAPS AREDS 2 PO) Take 1 capsule by mouth 2 (two) times daily.      OVER THE COUNTER MEDICATION Take 300 mg by mouth daily at 12 noon. ADRENAL SUPPORT WITH ASHWAGANDHA     potassium chloride SA (KLOR-CON) 20 MEQ tablet Take 20 mEq by mouth daily as needed (when taking furosemide).     spironolactone (ALDACTONE) 25 MG tablet Take 1 tablet (25 mg total) by mouth daily. 90 tablet 3   valsartan (DIOVAN) 320 MG tablet Take 1 tablet (320 mg total) by mouth daily. 90 tablet 3   No current facility-administered medications on file prior to visit.    Allergies  Allergen Reactions   Levofloxacin     Other reaction(s): Unknown   Lisinopril Itching    No energy   Penicillins Itching and Rash    Has patient had a PCN reaction causing immediate rash, facial/tongue/throat swelling, SOB or lightheadedness with hypotension: No Has patient had a PCN reaction causing severe rash involving mucus membranes or skin necrosis: No Has patient had a PCN reaction that required hospitalization: No Has patient had a PCN reaction occurring within the last 10 years: No If all of the above answers are "NO", then may proceed with Cephalosporin use.   Sulfa Antibiotics Itching and Rash    Assessment/Plan:  1. Hypertension - Blood  pressure very close to goal of <130/80 in clinic today. Still reporting urination frequency that affects her sleeping. Not improved with lower spironolactone dose. States it stated when she started spironolactone. Therefore will STOP spironolactone and increase amlodipine to 10mg  daily. Continue chlorthalidone 25mg  daily and valsartan 320mg  daily. Follow up in 3 weeks. Assisted patient with scheduling her 3rd COVID vaccine at [facility] pharmacy.  Thank you,  Ramond Dial, Pharm.D, BCPS, CPP [place]  [address] [place], [place] [address]  Phone: [phone]; Fax: [phone]

## 2021-03-28 NOTE — Patient Instructions (Addendum)
A pleasure seeing you as always  Please STOP taking spironolactone  INCREASE amlodipine to 10mg  daily. You may take 2 of the 5mg  tablets to equal 10mg  daily  Continue chlorthalidone 25 mg daily (AM) and valsartan 320mg  daily  Your 3rd COVID shot is scheduled for 6/15 @ 10:30  Keep checking your blood pressure once a day

## 2021-04-03 ENCOUNTER — Other Ambulatory Visit: Payer: Self-pay

## 2021-04-03 ENCOUNTER — Ambulatory Visit: Payer: Medicare HMO | Admitting: Radiation Oncology

## 2021-04-03 ENCOUNTER — Ambulatory Visit: Payer: Medicare HMO | Attending: Internal Medicine

## 2021-04-03 DIAGNOSIS — C3491 Malignant neoplasm of unspecified part of right bronchus or lung: Secondary | ICD-10-CM

## 2021-04-03 DIAGNOSIS — Z23 Encounter for immunization: Secondary | ICD-10-CM

## 2021-04-03 NOTE — Progress Notes (Signed)
   Covid-19 Vaccination Clinic  Name:  Alleene Stoy Dyar    MRN: [id number] DOB: July 13, 1935  04/03/2021  Ms. Metivier was observed post Covid-19 immunization for 15 minutes without incident. She was provided with Vaccine Information Sheet and instruction to access the V-Safe system.   Ms. Jasmer was instructed to call 911 with any severe reactions post vaccine: Difficulty breathing  Swelling of face and throat  A fast heartbeat  A bad rash all over body  Dizziness and weakness   Immunizations Administered     Name Date Dose VIS Date Route   Moderna Covid-19 Booster Vaccine 04/03/2021 10:16 AM 0.25 mL 08/08/2020 Intramuscular   Manufacturer: Levan Hurst   Lot: [id number]   [place]: [id number]      Lu Duffel, PharmD, MBA Clinical Acute Care Pharmacist

## 2021-04-09 ENCOUNTER — Other Ambulatory Visit (HOSPITAL_COMMUNITY): Payer: Self-pay

## 2021-04-09 MED ORDER — COVID-19 MRNA VACC (MODERNA) 100 MCG/0.5ML IM SUSP
INTRAMUSCULAR | 0 refills | Status: DC
  Filled 2021-04-09: qty 0.5, 17d supply, fill #0

## 2021-04-10 ENCOUNTER — Other Ambulatory Visit: Payer: Self-pay | Admitting: Pharmacist

## 2021-04-10 MED ORDER — AMLODIPINE BESYLATE 10 MG PO TABS: 10.0000 mg | ORAL_TABLET | Freq: Every day | ORAL | 3 refills | Status: DC

## 2021-04-12 ENCOUNTER — Other Ambulatory Visit (HOSPITAL_COMMUNITY): Payer: Self-pay

## 2021-04-18 ENCOUNTER — Other Ambulatory Visit: Payer: Self-pay

## 2021-04-18 ENCOUNTER — Ambulatory Visit (INDEPENDENT_AMBULATORY_CARE_PROVIDER_SITE_OTHER): Payer: Medicare HMO | Admitting: Pharmacist

## 2021-04-18 VITALS — BP 124/45 | HR 46

## 2021-04-18 DIAGNOSIS — I10 Essential (primary) hypertension: Secondary | ICD-10-CM

## 2021-04-18 NOTE — Patient Instructions (Addendum)
Continue chlorthalidone 25mg  daily, amlodipine 10mg  daily and valsartan 320mg  daily.  Call me if blood pressure is consistently >140/90  Call me with any concerns [phone]

## 2021-04-18 NOTE — Progress Notes (Signed)
Patient ID: Alisha [name]                 DOB: 15-Apr-1935                      MRN: [id number]     HPI: Alisha [name] is a 86 y.o. female referred by [name] to HTN clinic. PMH is significant for chronic bradycardia, diastolic dysfunction, HTN, Breast cancer w/ radiation, lung cancer s/p radiation, hypothyroidism, pre-DM.   She was last seen in HTN clinic for follow-up on 03/28/21. She complained of increased urinary frequency at night even with lower dose of spironolactone. Therefore spironolactone was stopped and amlodipine was increased to 10mg  daily.  Patient presents today for follow up. She states she had one episode yesterday where she felt dizzy, but states she had been outside in the heat for a little while. She has a little swelling on and off. Nothing of concern to her. Has not needed to take furosemide. Her energy level has been pretty good. Feels best when she is able to exercise. Her urinary frequency is much better after stopping spironolactone. Home readings are all <175 systolic.   Home cuff previously compared to clinic reading 156/62 clinic right arm, Home left wrist-158/75.  Current HTN meds: chlorthalidone 25 mg daily (AM), amlodipine 10mg  daily, valsartan 320mg  daily, PRN Lasix 20mg     Previously tried: lisinopril (low energy, itching), valsartan-HCTZ (low energy, itchiness), amlodipine 2.5mg  daily (fatique, dizziness, "skin issues"), spironolactone 25/12.5 mg daily (increased urinary frequency at night)), losartan 12.5 mg daily (itching)  BP goal: <130/80 or <140/90  Family History: HTN in mother and father  Social History: Denies alcohol or smoking  Diet: Continues to work on her diet - enjoys vegetables, beans, chicken, fish, occasional homemade burger (1x/mo), salad - does not add salt to food  Drinks: 1 cup of coffee/day, 6 8-oz glasses of water per day   Exercise: Increased exercise to 30 minutes per day - using weights, exercise bike, and  treadmill, treadmill (10 min each per day)  Home BP readings: 136/49, 139/50, 125/43, 136/49,135/64, 124/58, 106/54, 119/63, 114/56, 134/53, 136/62, 132/51, 114/61 HR 40's-50's  Wt Readings from Last 3 Encounters:  01/24/21 216 lb 9.6 oz (98.2 kg)  01/23/21 216 lb (98 kg)  12/21/20 220 lb (99.8 kg)   BP Readings from Last 3 Encounters:  03/28/21 (!) 132/50  03/08/21 (!) 112/50  02/21/21 (!) 150/52   Pulse Readings from Last 3 Encounters:  03/28/21 (!) 49  03/08/21 (!) 47  02/21/21 (!) 53    Renal function: CrCl cannot be calculated (Patient's most recent lab result is older than the maximum 21 days allowed.).  Past Medical History:  Diagnosis Date   Arthritis    Asthma    Never smoker   Blood transfusion    over 40 y ears   Breast mass, left    Cancer [facility])    History of radiation therapy 09/07/19- 10/06/19   Left Breast 15 fractions of 2.67 Gy to total 40.05 Gy. Left breast boost 5 fractions of 2 Gy each to total 10 Gy   Hypertension    on meds   Hypothyroidism    on meds   Pre-diabetes     Current Outpatient Medications on File Prior to Visit  Medication Sig Dispense Refill   acetaminophen (TYLENOL) 500 MG tablet Take 1,000 mg by mouth every 6 (six) hours as needed (pain).     albuterol (PROVENTIL HFA;VENTOLIN HFA)  108 (90 Base) MCG/ACT inhaler Inhale 1-2 puffs into the lungs every 6 (six) hours as needed for wheezing or shortness of breath.      amLODipine (NORVASC) 10 MG tablet Take 1 tablet (10 mg total) by mouth daily. 90 tablet 3   Artificial Tear Solution (SOOTHE XP OP) Place 1 drop into both eyes in the morning and at bedtime.     Biotin 5 MG TABS Take 5 mg by mouth daily at 12 noon.     cetaphil (CETAPHIL) cream Apply 1 application topically as needed (dry skin).     chlorthalidone (HYGROTON) 25 MG tablet Take 1 tablet (25 mg total) by mouth daily. 90 tablet 3   COVID-19 mRNA vaccine, Moderna, 100 MCG/0.5ML injection Inject into the muscle. 0.5 mL 0    diphenhydrAMINE HCl 50 MG/30ML LIQD Take 50 mg by mouth at bedtime as needed (sleep).     fluticasone (FLONASE) 50 MCG/ACT nasal spray Place 2 sprays into both nostrils daily.     furosemide (LASIX) 20 MG tablet Take 20 mg by mouth daily as needed for fluid or edema.     gabapentin (NEURONTIN) 100 MG capsule Take 1 capsule (100 mg total) by mouth at bedtime.     levothyroxine (SYNTHROID, LEVOTHROID) 88 MCG tablet Take 88 mcg by mouth daily before breakfast.      meclizine (ANTIVERT) 25 MG tablet Take 12.5 mg by mouth 3 (three) times daily as needed for dizziness.     Multiple Vitamins-Minerals (ICAPS AREDS 2 PO) Take 1 capsule by mouth 2 (two) times daily.      OVER THE COUNTER MEDICATION Take 300 mg by mouth daily at 12 noon. ADRENAL SUPPORT WITH ASHWAGANDHA     potassium chloride SA (KLOR-CON) 20 MEQ tablet Take 20 mEq by mouth daily as needed (when taking furosemide).     valsartan (DIOVAN) 320 MG tablet Take 1 tablet (320 mg total) by mouth daily. 90 tablet 3   No current facility-administered medications on file prior to visit.    Allergies  Allergen Reactions   Levofloxacin     Other reaction(s): Unknown   Lisinopril Itching    No energy   Penicillins Itching and Rash    Has patient had a PCN reaction causing immediate rash, facial/tongue/throat swelling, SOB or lightheadedness with hypotension: No Has patient had a PCN reaction causing severe rash involving mucus membranes or skin necrosis: No Has patient had a PCN reaction that required hospitalization: No Has patient had a PCN reaction occurring within the last 10 years: No If all of the above answers are "NO", then may proceed with Cephalosporin use.   Sulfa Antibiotics Itching and Rash    Assessment/Plan:  1. Hypertension - Blood pressure at goal of <130/80 in clinic today. Home readings mostly < 130 and all less than 734 systolic. Continue valsartan 320mg  daily, chlorthalidone 25mg  daily and amlodipine 10mg  daily. Follow up  prn.  Thank you,  Ramond Dial, Pharm.D, BCPS, CPP [place]  [address], [place], [place] [address]  Phone: [phone]; Fax: [phone]

## 2021-05-06 DIAGNOSIS — C3491 Malignant neoplasm of unspecified part of right bronchus or lung: Secondary | ICD-10-CM | POA: Diagnosis not present

## 2021-05-06 DIAGNOSIS — I700 Atherosclerosis of aorta: Secondary | ICD-10-CM | POA: Diagnosis not present

## 2021-05-06 DIAGNOSIS — E1169 Type 2 diabetes mellitus with other specified complication: Secondary | ICD-10-CM | POA: Diagnosis not present

## 2021-05-06 DIAGNOSIS — I10 Essential (primary) hypertension: Secondary | ICD-10-CM | POA: Diagnosis not present

## 2021-05-06 DIAGNOSIS — Z0000 Encounter for general adult medical examination without abnormal findings: Secondary | ICD-10-CM | POA: Diagnosis not present

## 2021-05-06 DIAGNOSIS — E039 Hypothyroidism, unspecified: Secondary | ICD-10-CM | POA: Diagnosis not present

## 2021-05-06 DIAGNOSIS — Z853 Personal history of malignant neoplasm of breast: Secondary | ICD-10-CM | POA: Diagnosis not present

## 2021-05-06 DIAGNOSIS — E559 Vitamin D deficiency, unspecified: Secondary | ICD-10-CM | POA: Diagnosis not present

## 2021-05-13 NOTE — Progress Notes (Signed)
                                                                                                                                                             Patient Name: Alisha [name] MRN: [id number] DOB: [birth year] Referring Physician: Delena Bali (Profile Not Attached) Date of Service: 02/20/2021 [facility] [place]                                                        End Of Treatment Note  Diagnoses: C34.11-Malignant neoplasm of upper lobe, right bronchus or lung C34.91-Malignant neoplasm of unspecified part of right bronchus or lung  Cancer Staging: Cancer Staging Malignant neoplasm of upper lobe of right lung [facility]) Staging form: Lung, AJCC 8th Edition - Clinical stage from 01/23/2021: cT1, cN0, cM0 - Signed by Eppie Gibson, MD on 01/23/2021 Stage prefix: Initial diagnosis Laterality: Right  Malignant neoplasm of upper-outer quadrant of left breast in female, estrogen receptor negative ([place]) Staging form: Breast, AJCC 8th Edition - Clinical stage from 06/22/2019: Stage IB (cT1b, cN0, cM0, G3, ER-, PR-, HER2-) - Signed by Gardenia Phlegm, NP on 06/29/2019 Stage prefix: Initial diagnosis Histologic grading system: 3 grade system - Pathologic: Stage IB (pT1b, pN0, cM0, G3, ER-, PR-, HER2-) - Signed by Gardenia Phlegm, NP on 08/26/2020 Stage prefix: Initial diagnosis Histologic grading system: 3 grade system   Intent: Curative  Radiation Treatment Dates: 02/13/2021 through 02/20/2021 Site Technique Total Dose (Gy) Dose per Fx (Gy) Completed Fx Beam Energies  Lung, Right: [id number] IMRT 54/54 18 3/3 [id number]   Narrative: The patient tolerated radiation therapy relatively well.   Plan: The patient will follow-up with radiation oncology in 22mo or as needed. -----------------------------------  SEppie Gibson MD

## 2021-05-16 DIAGNOSIS — R69 Illness, unspecified: Secondary | ICD-10-CM | POA: Diagnosis not present

## 2021-05-17 DIAGNOSIS — I700 Atherosclerosis of aorta: Secondary | ICD-10-CM | POA: Diagnosis not present

## 2021-05-17 DIAGNOSIS — E559 Vitamin D deficiency, unspecified: Secondary | ICD-10-CM | POA: Diagnosis not present

## 2021-05-17 DIAGNOSIS — C3491 Malignant neoplasm of unspecified part of right bronchus or lung: Secondary | ICD-10-CM | POA: Diagnosis not present

## 2021-05-17 DIAGNOSIS — E1169 Type 2 diabetes mellitus with other specified complication: Secondary | ICD-10-CM | POA: Diagnosis not present

## 2021-05-17 DIAGNOSIS — D649 Anemia, unspecified: Secondary | ICD-10-CM | POA: Diagnosis not present

## 2021-05-17 DIAGNOSIS — E039 Hypothyroidism, unspecified: Secondary | ICD-10-CM | POA: Diagnosis not present

## 2021-05-17 DIAGNOSIS — Z0000 Encounter for general adult medical examination without abnormal findings: Secondary | ICD-10-CM | POA: Diagnosis not present

## 2021-05-21 ENCOUNTER — Telehealth: Payer: Self-pay | Admitting: *Deleted

## 2021-05-21 NOTE — Telephone Encounter (Signed)
Called patient to inform of CT for 06-03-21- arrival time- 11:15 am @ [facility], no restrictions to test, patient to receive Ct results from Dr. Isidore Moos on 06-04-21 @ 2:40 pm, spoke with patient and she is aware of these appts

## 2021-05-23 DIAGNOSIS — H402231 Chronic angle-closure glaucoma, bilateral, mild stage: Secondary | ICD-10-CM | POA: Diagnosis not present

## 2021-05-23 DIAGNOSIS — H40053 Ocular hypertension, bilateral: Secondary | ICD-10-CM | POA: Diagnosis not present

## 2021-06-03 ENCOUNTER — Ambulatory Visit (HOSPITAL_COMMUNITY)
Admission: RE | Admit: 2021-06-03 | Discharge: 2021-06-03 | Disposition: A | Discharge status: Home / Self Care | Payer: Medicare HMO | Source: Ambulatory Visit | Attending: Radiation Oncology | Admitting: Radiation Oncology

## 2021-06-03 ENCOUNTER — Other Ambulatory Visit: Payer: Self-pay

## 2021-06-03 ENCOUNTER — Encounter: Payer: Self-pay | Admitting: Podiatry

## 2021-06-03 ENCOUNTER — Ambulatory Visit (INDEPENDENT_AMBULATORY_CARE_PROVIDER_SITE_OTHER): Payer: Medicare HMO | Admitting: Podiatry

## 2021-06-03 DIAGNOSIS — L84 Corns and callosities: Secondary | ICD-10-CM

## 2021-06-03 DIAGNOSIS — B351 Tinea unguium: Secondary | ICD-10-CM

## 2021-06-03 DIAGNOSIS — C3490 Malignant neoplasm of unspecified part of unspecified bronchus or lung: Secondary | ICD-10-CM | POA: Diagnosis not present

## 2021-06-03 DIAGNOSIS — M79674 Pain in right toe(s): Secondary | ICD-10-CM

## 2021-06-03 DIAGNOSIS — M79675 Pain in left toe(s): Secondary | ICD-10-CM | POA: Diagnosis not present

## 2021-06-03 DIAGNOSIS — I700 Atherosclerosis of aorta: Secondary | ICD-10-CM | POA: Diagnosis not present

## 2021-06-03 DIAGNOSIS — C3491 Malignant neoplasm of unspecified part of right bronchus or lung: Secondary | ICD-10-CM | POA: Diagnosis not present

## 2021-06-03 DIAGNOSIS — G629 Polyneuropathy, unspecified: Secondary | ICD-10-CM | POA: Diagnosis not present

## 2021-06-03 DIAGNOSIS — R911 Solitary pulmonary nodule: Secondary | ICD-10-CM | POA: Diagnosis not present

## 2021-06-04 ENCOUNTER — Encounter: Payer: Self-pay | Admitting: Radiation Oncology

## 2021-06-04 ENCOUNTER — Ambulatory Visit
Admission: RE | Admit: 2021-06-04 | Discharge: 2021-06-04 | Disposition: A | Discharge status: Home / Self Care | Payer: Medicare HMO | Source: Ambulatory Visit | Attending: Radiation Oncology | Admitting: Radiation Oncology

## 2021-06-04 VITALS — BP 104/91 | HR 56 | Temp 97.2°F | Resp 18 | Wt 209.4 lb

## 2021-06-04 DIAGNOSIS — I2510 Atherosclerotic heart disease of native coronary artery without angina pectoris: Secondary | ICD-10-CM | POA: Diagnosis not present

## 2021-06-04 DIAGNOSIS — C3411 Malignant neoplasm of upper lobe, right bronchus or lung: Secondary | ICD-10-CM

## 2021-06-04 DIAGNOSIS — Z79899 Other long term (current) drug therapy: Secondary | ICD-10-CM | POA: Insufficient documentation

## 2021-06-04 DIAGNOSIS — I700 Atherosclerosis of aorta: Secondary | ICD-10-CM | POA: Insufficient documentation

## 2021-06-04 DIAGNOSIS — L309 Dermatitis, unspecified: Secondary | ICD-10-CM | POA: Diagnosis not present

## 2021-06-04 DIAGNOSIS — Z923 Personal history of irradiation: Secondary | ICD-10-CM | POA: Diagnosis not present

## 2021-06-04 DIAGNOSIS — K449 Diaphragmatic hernia without obstruction or gangrene: Secondary | ICD-10-CM | POA: Diagnosis not present

## 2021-06-04 DIAGNOSIS — Z08 Encounter for follow-up examination after completed treatment for malignant neoplasm: Secondary | ICD-10-CM | POA: Diagnosis not present

## 2021-06-04 NOTE — Progress Notes (Signed)
Ms.  Croson presents today for follow-up after completing radiation to her right upper lung on 02/20/2021, and to receive CT scan results from 06/03/2021  Reports her respiratory status fluctuates between "good days and bad days". She states today is a good day. Denies any changes in appetite, but does report recent dental work has made it difficult to eat which has resulted in her loosing some weight. Denies a dry or productive. Reports persistent fatigue despite sleeping well, and states the tiredness impacts her daily activities. Reports occasional chest tightness when she's out walking her dog, but states symptoms resolve after using her albuterol inhaler. She reports she's developed a mild dermatitis to her arms/legs/vaginal area. She thinks it may be related to a change in body soap, but she reports she has an appointment with her GYN at the end of the month just in case issue doesn't resolve. Scheduled for F/U with medical oncologist (Dr. Jana Hakim in October of this year)  Wt Readings from Last 3 Encounters:  06/04/21 209 lb 6 oz (95 kg)  01/24/21 216 lb 9.6 oz (98.2 kg)  01/23/21 216 lb (98 kg)   Vitals:   06/04/21 1456  BP: (!) 104/91  Pulse: (!) 56  Resp: 18  Temp: (!) 97.2 F (36.2 C)  SpO2: 99%

## 2021-06-04 NOTE — Progress Notes (Signed)
Radiation Oncology         [phone] ________________________________  Name: Alisha [name] MRN: [id number]  Date: 06/04/2021  DOB: Mar 05, 1935  Follow-Up Visit Note  Outpatient  CC: [name], MD  [name], MD  Diagnosis and Prior Radiotherapy:    ICD-10-CM   1. Malignant neoplasm of upper lobe of right lung [facility])  C34.11       Radiation Treatment Dates: 02/13/2021 through 02/20/2021 Site Technique Total Dose (Gy) Dose per Fx (Gy) Completed Fx Beam Energies  Lung, Right: Lung_Rt_RUL IMRT 54/54 18 3/3 6XFFF   CHIEF COMPLAINT: Here for follow-up and surveillance of lung cancer  Narrative:  The patient returns today for routine follow-up.  Ms.  Alisha [name] presents today for follow-up after completing radiation to her right upper lung on 02/20/2021, and to receive CT scan results from 06/03/2021  Reports her respiratory status fluctuates between "good days and bad days". She states today is a good day. Denies any changes in appetite, but does report recent dental work has made it difficult to eat which has resulted in her loosing some weight. Denies a dry or productive. Reports persistent fatigue despite sleeping well, and states the tiredness impacts her daily activities. Reports occasional chest tightness when she's out walking her dog, but states symptoms resolve after using her albuterol inhaler. She reports she's developed a mild dermatitis to her arms/legs/vaginal area. She thinks it may be related to a change in body soap, but she reports she has an appointment with her GYN at the end of the month just in case issue doesn't resolve. Scheduled for F/U with medical oncologist (Dr. Jana Hakim in October of this year)  Wt Readings from Last 3 Encounters:  06/04/21 209 lb 6 oz (95 kg)  01/24/21 216 lb 9.6 oz (98.2 kg)  01/23/21 216 lb (98 kg)   Vitals:   06/04/21 1456  BP: (!) 104/91  Pulse: (!) 56  Resp: 18  Temp: (!) 97.2 F (36.2 C)  SpO2: 99%                                    ALLERGIES:  is allergic to levofloxacin, lisinopril, penicillins, and sulfa antibiotics.  Meds: Current Outpatient Medications  Medication Sig Dispense Refill   acetaminophen (TYLENOL) 500 MG tablet Take 1,000 mg by mouth every 6 (six) hours as needed (pain).     albuterol (PROVENTIL HFA;VENTOLIN HFA) 108 (90 Base) MCG/ACT inhaler Inhale 1-2 puffs into the lungs every 6 (six) hours as needed for wheezing or shortness of breath.      amLODipine (NORVASC) 10 MG tablet Take 1 tablet (10 mg total) by mouth daily. 90 tablet 3   Artificial Tear Solution (SOOTHE XP OP) Place 1 drop into both eyes in the morning and at bedtime.     Biotin 5 MG TABS Take 5 mg by mouth daily at 12 noon.     cetaphil (CETAPHIL) cream Apply 1 application topically as needed (dry skin).     chlorthalidone (HYGROTON) 25 MG tablet Take 1 tablet (25 mg total) by mouth daily. 90 tablet 3   COVID-19 mRNA vaccine, Moderna, 100 MCG/0.5ML injection Inject into the muscle. 0.5 mL 0   diphenhydrAMINE HCl 50 MG/30ML LIQD Take 50 mg by mouth at bedtime as needed (sleep).     fluticasone (FLONASE) 50 MCG/ACT nasal spray Place 2 sprays into both nostrils daily.     furosemide (LASIX)  20 MG tablet Take 20 mg by mouth daily as needed for fluid or edema.     gabapentin (NEURONTIN) 100 MG capsule Take 1 capsule (100 mg total) by mouth at bedtime.     levothyroxine (SYNTHROID, LEVOTHROID) 88 MCG tablet Take 88 mcg by mouth daily before breakfast.      meclizine (ANTIVERT) 25 MG tablet Take 12.5 mg by mouth 3 (three) times daily as needed for dizziness.     Multiple Vitamins-Minerals (ICAPS AREDS 2 PO) Take 1 capsule by mouth 2 (two) times daily.      OVER THE COUNTER MEDICATION Take 300 mg by mouth daily at 12 noon. ADRENAL SUPPORT WITH ASHWAGANDHA     potassium chloride SA (KLOR-CON) 20 MEQ tablet Take 20 mEq by mouth daily as needed (when taking furosemide).     valsartan (DIOVAN) 320 MG tablet Take 1 tablet (320 mg total)  by mouth daily. 90 tablet 3   No current facility-administered medications for this encounter.    Physical Findings: The patient is in no acute distress. Patient is alert and oriented.  weight is 209 lb 6 oz (95 kg). Her oral temperature is 97.2 F (36.2 C) (abnormal). Her blood pressure is 104/91 (abnormal) and her pulse is 56 (abnormal). Her respiration is 18 and oxygen saturation is 99%. Marland Kitchen    HEART RRR upon ausculation CHEST CTAB HUD:[id number] Skin: rash is faint and subtle on arms, pink spots noted  Lab Findings: Lab Results  Component Value Date   WBC 10.5 12/20/2020   HGB 11.1 (L) 12/20/2020   HCT 33.6 (L) 12/20/2020   MCV 96.0 12/20/2020   PLT 292 12/20/2020    Radiographic Findings: CT Chest Wo Contrast  Result Date: 06/03/2021 CLINICAL DATA:  Metastatic non-small cell lung cancer. Assess treatment response. EXAM: CT CHEST WITHOUT CONTRAST TECHNIQUE: Multidetector CT imaging of the chest was performed following the standard protocol without IV contrast. COMPARISON:  Chest CT 11/14/2020 and 03/08/2019. FINDINGS: Cardiovascular: Mild aortic and coronary artery atherosclerosis. No acute vascular findings on noncontrast imaging. The heart size is normal. There is no pericardial effusion. Mediastinum/Nodes: There are no enlarged mediastinal, hilar or axillary lymph nodes.Hilar assessment is limited by the lack of intravenous contrast, although the hilar contours appear unchanged. Stable small hiatal hernia. The thyroid gland and trachea appear unremarkable. Lungs/Pleura: There is no pleural effusion. New fiducial markers anteriorly in the right upper lobe with new radiation changes largely obscuring the previously demonstrated right upper lobe pulmonary nodule. Remnant of the nodule may be visible, measuring approximately 5 mm on image 32/5 (previously 9 mm). Multiple other smaller right lung nodules are unchanged. There are no new or enlarging pulmonary nodules. Upper abdomen: The  visualized upper abdomen appears stable without significant findings. Musculoskeletal/Chest wall: There is no chest wall mass or suspicious osseous finding. Stable postsurgical changes medially in the left breast. Stable degenerative changes in the spine, sternum and right sternoclavicular joint. Small bilateral cervical ribs. IMPRESSION: 1. Decreased size of right upper lobe pulmonary nodule consistent with response to therapy. There are associated radiation changes in the surrounding lung. 2. Multiple additional right-sided pulmonary nodules are unchanged and considered benign. No evidence of metastatic disease. 3. Mild coronary and Aortic Atherosclerosis (ICD10-I70.0). Electronically Signed   By: Richardean Sale M.D.   On: 06/03/2021 15:29    Impression/Plan:  Excellent response to SBRT to right upper lung tumor.  Remainder of lungs remain stable.  Element of treatment related changes noted in right upper lung; the patient  does not have symptomatic pneumonitis.  Follow-up with me in 3 months with repeat CT chest without contrast  Rash: improving after changing body wash  On date of service, in total, I spent 20 minutes on this encounter. Patient was seen in person.  _____________________________________   Eppie Gibson, MD

## 2021-06-05 ENCOUNTER — Other Ambulatory Visit: Payer: Self-pay

## 2021-06-05 DIAGNOSIS — C3411 Malignant neoplasm of upper lobe, right bronchus or lung: Secondary | ICD-10-CM

## 2021-06-08 NOTE — Progress Notes (Signed)
Subjective: Alisha [name] is a pleasant 86 y.o. female patient seen today painful thick toenails that are difficult to trim. Pain interferes with ambulation. Aggravating factors include wearing enclosed shoe gear. Pain is relieved with periodic professional debridement.  She states she is wearing Skechers on today's visit. She states she has three pair.  Patient voices no new pedal problems on today's visit.  Alisha [name] has h/o neuropathy which is managed with gabapentin.  PCP is [name], MD. Last visit was: 12/07/2020.  Allergies  Allergen Reactions   Levofloxacin     Other reaction(s): Unknown   Lisinopril Itching    No energy   Penicillins Itching and Rash    Has patient had a PCN reaction causing immediate rash, facial/tongue/throat swelling, SOB or lightheadedness with hypotension: No Has patient had a PCN reaction causing severe rash involving mucus membranes or skin necrosis: No Has patient had a PCN reaction that required hospitalization: No Has patient had a PCN reaction occurring within the last 10 years: No If all of the above answers are "NO", then may proceed with Cephalosporin use.   Sulfa Antibiotics Itching and Rash    Objective: Physical Exam  General: Alisha [name] is a pleasant 86 y.o. African American female, in NAD. AAO x 3.   Vascular:  Capillary fill time to digits <3 seconds b/l lower extremities. Palpable DP pulse(s) b/l lower extremities Palpable PT pulse(s) b/l lower extremities Pedal hair sparse. Lower extremity skin temperature gradient within normal limits. No pain with calf compression b/l. No edema noted b/l lower extremities.  Dermatological:  Pedal skin with normal turgor, texture and tone b/l lower extremities. No open wounds b/l lower extremities. No interdigital macerations b/l lower extremities. Toenails 1-5 b/l elongated, discolored, dystrophic, thickened, crumbly with subungual debris and tenderness to dorsal palpation.  Hyperkeratotic lesion(s) R hallux, submet head 5 left foot, and submet head 5 right foot.  No erythema, no edema, no drainage, no fluctuance.  Musculoskeletal:  Normal muscle strength 5/5 to all lower extremity muscle groups bilaterally. Hammertoe(s) noted to the L hallux, L 2nd toe, and L 3rd toe.  Neurological:  Pt has subjective symptoms of neuropathy. Protective sensation decreased with 10 gram monofilament b/l.  Assessment and Plan:  1. Pain due to onychomycosis of toenails of both feet   2. Callus   3. Neuropathy      -Examined patient. -Patient to continue soft, supportive shoe gear daily. -Toenails 1-5 b/l were debrided in length and girth with sterile nail nippers and dremel without iatrogenic bleeding.  -Callus(es) R hallux, submet head 5 left foot, and submet head 5 right foot pared utilizing sterile scalpel blade without complication or incident. Total number debrided =3. -Patient to report any pedal injuries to medical professional immediately. -Patient/POA to call should there be question/concern in the interim.  Return in about 3 months (around 09/03/2021).  Marzetta Board, DPM

## 2021-06-18 DIAGNOSIS — Z01419 Encounter for gynecological examination (general) (routine) without abnormal findings: Secondary | ICD-10-CM | POA: Diagnosis not present

## 2021-06-18 DIAGNOSIS — Z01411 Encounter for gynecological examination (general) (routine) with abnormal findings: Secondary | ICD-10-CM | POA: Diagnosis not present

## 2021-06-18 DIAGNOSIS — N8111 Cystocele, midline: Secondary | ICD-10-CM | POA: Diagnosis not present

## 2021-07-21 NOTE — Progress Notes (Signed)
[place]  Telephone:([phone]) [phone] Fax:(336) [phone]     ID: Alisha [name] DOB: 11-07-1934  MR#: [id number]  ONG#:[id number]  Patient Care Team: [name], MD as PCP - General (Family Medicine) Erroll Luna, MD as Consulting Physician (General Surgery) Eppie Gibson, MD as Attending Physician (Radiation Oncology) Leonie Man, MD as Consulting Physician (Cardiology) Alba Kriesel, Virgie Dad, MD as Consulting Physician (Oncology) Collene Gobble, MD as Consulting Physician (Pulmonary Disease) Chauncey Cruel, MD OTHER MD:   CHIEF COMPLAINT: triple negative breast cancer; squamous cell lung cancer  CURRENT TREATMENT: Observation   INTERVAL HISTORY: Alisha [name] returns today for follow up of her triple negative breast cancer. She continues under observation.  Since her last visit, she received SBRT treatment under Dr. Isidore Moos in 01/2021 for her squamous cell lung cancer.  Follow-up chest CT scan 06/03/2021 showed:  1. Decreased size of right upper lobe pulmonary nodule consistent with response to therapy. There are associated radiation changes in the surrounding lung. 2. Multiple additional right-sided pulmonary nodules are unchanged and considered benign. No evidence of metastatic disease. 3. Mild coronary and Aortic Atherosclerosis (ICD10-I70.0).   She is now behind on her annual mammography, most recent being on 06/18/2020.  Quite aside from cancer issues, there has been a steady drop in her hemoglobin  Results for Alisha, [name] (MRN [id number]) as of 07/22/2021 11:02  Ref. Range 07/21/2019 15:25 02/13/2020 13:59 09/05/2020 14:51 12/20/2020 14:56 07/22/2021 10:07  Hemoglobin Latest Ref Range: 12.0 - 15.0 g/dL 12.7 12.3 11.7 (L) 11.1 (L) 10.8 (L)   With no change on her MCV.  Results for Alisha, [name] (MRN [id number]) as of 07/22/2021 11:02  Ref. Range 07/21/2019 15:25 02/13/2020 13:59 09/05/2020 14:51 12/20/2020 14:56 07/22/2021 10:07  MCV  Latest Ref Range: 80.0 - 100.0 fL 95.7 96.8 96.3 96.0 95.7   Her creatinine remains normal  Results for Alisha, [name] (MRN [id number]) as of 07/22/2021 11:02  Ref. Range 11/29/2020 11:00 12/13/2020 10:35 12/20/2020 14:56 01/24/2021 16:27 07/22/2021 10:07  Creatinine Latest Ref Range: 0.44 - 1.00 mg/dL 0.76 0.82 0.88 0.88 0.85   REVIEW OF SYSTEMS: Alisha [name] did well with her radiation and currently has no problems with cough shortness of breath or pleurisy.  She has "bumps" that come up on her skin.  She describes them as flat, not red, not like pimples, and itchy.  They come and go.  They seem consistent with hives from her description but she does not have any to show me today.  She has been instructed to not use soap for a while and see if that makes a difference.  She has knee problems which keep her from walking for exercise but she is able to get her activities of daily living accomplished.  A detailed review of systems was otherwise stable   COVID 19 VACCINATION STATUS: infection 11/2019; Pfizer x4, most recently 03/2021   HISTORY OF CURRENT ILLNESS: From the original intake note:  Alisha [name] was noted to have a left breast upper-outer quadrant distortion on screening mammogram in 2017. She proceeded to biopsy ([id number]), which showed a complex sclerosing lesion. She opted to undergo left lumpectomy ([id number]) on 08/11/2016 under Dr. Brantley Stage, which revealed a radial scar with calcifications.   She presented for routine screening mammogram, which showed a possible mass and adjacent calcifications in the left breast. She was referred to [facility] for further imaging on 06/16/2019. Physical exam performed that day showed a palpable soft thickening in the upper central  aspect of the left breast. She underwent left diagnostic mammography with tomography and ultrasonography showing: breast density category B; suspicious 7 mm mass in the 12 o'clock location of the left breast;  small indeterminate group of calcifications 4 mm anterior to the mass warrant excision if the mass is positive for malignancy; left axilla negative for adenopathy.  Accordingly on 06/22/2019 she proceeded to biopsy of the left breast area in question. The pathology from this procedure ([id number]) showed: invasive ductal carcinoma, grade 3. Prognostic indicators significant for: estrogen receptor, 0% negative and progesterone receptor, 0% negative. Proliferation marker Ki67 at 30%. HER2 negative by immunohistochemistry (0).  The patient's subsequent history is as detailed below.   PAST MEDICAL HISTORY: Past Medical History:  Diagnosis Date   Arthritis    Asthma    Never smoker   Blood transfusion    over 40 y ears   Breast mass, left    Cancer [facility]    History of radiation therapy 09/07/19- 10/06/19   Left Breast 15 fractions of 2.67 Gy to total 40.05 Gy. Left breast boost 5 fractions of 2 Gy each to total 10 Gy   Hypertension    on meds   Hypothyroidism    on meds   Pre-diabetes     PAST SURGICAL HISTORY: Past Surgical History:  Procedure Laterality Date   ABDOMINAL HYSTERECTOMY     32 yrs   BREAST LUMPECTOMY WITH RADIOACTIVE SEED AND SENTINEL LYMPH NODE BIOPSY Left 07/27/2019   Procedure: LEFT BREAST LUMPECTOMY WITH RADIOACTIVE SEED AND LEFT SENTINEL LYMPH NODE MAPPING;  Surgeon: Erroll Luna, MD;  Location: [place];  Service: General;  Laterality: Left;   BREAST LUMPECTOMY WITH RADIOACTIVE SEED LOCALIZATION Left 08/11/2016   Procedure: LEFT BREAST LUMPECTOMY WITH RADIOACTIVE SEED LOCALIZATION;  Surgeon: Erroll Luna, MD;  [place]  Service: General;  Laterality: Left;   BRONCHIAL BIOPSY  12/27/2020   Procedure: BRONCHIAL BIOPSIES;  Surgeon: Collene Gobble, MD;  Location: [facility] ENDOSCOPY;  Service: Pulmonary;;   BRONCHIAL BRUSHINGS  12/27/2020   Procedure: BRONCHIAL BRUSHINGS;  Surgeon: Collene Gobble, MD;  Location: [facility] ENDOSCOPY;  Service: Pulmonary;;    BRONCHIAL NEEDLE ASPIRATION BIOPSY  12/27/2020   Procedure: BRONCHIAL NEEDLE ASPIRATION BIOPSIES;  Surgeon: Collene Gobble, MD;  Location: [place];  Service: Pulmonary;;   BRONCHIAL WASHINGS  12/27/2020   Procedure: BRONCHIAL WASHINGS;  Surgeon: Collene Gobble, MD;  Location: [place] ENDOSCOPY;  Service: Pulmonary;;   CATARACT EXTRACTION, BILATERAL     CORONARY CALCIUM SCORE-CT ANGIOGRAM  04/2019   Small, < 92m nodule noted.  Difficult study to read.  Coronary calcium score was 11.  Significant motion artifact, but visible territory showed minimal disease.  Minimal calcium noted in the ostial LAD but no suggestion of significant CAD.   EYE SURGERY     FIDUCIAL MARKER PLACEMENT  12/27/2020   Procedure: FIDUCIAL MARKER PLACEMENT;  Surgeon: BCollene Gobble MD;  [facility];  Service: Pulmonary;;   JOINT REPLACEMENT Bilateral    left knee 2012   KNEE ARTHROPLASTY  02/17/2012   Procedure: COMPUTER ASSISTED TOTAL KNEE ARTHROPLASTY;  Surgeon: GMeredith Pel MD;  [place]  Service: Orthopedics;  Laterality: Right;  Right total knee replacement   TRANSTHORACIC ECHOCARDIOGRAM  04/02/2020   Normal LV function.  No R WMA.  Moderate LVH.  GRII DD with elevated filling pressures.  Moderate LA dilation with trivial MR.  Mild aortic valve sclerosis.   VIDEO BRONCHOSCOPY WITH ENDOBRONCHIAL NAVIGATION  N/A 12/27/2020   Procedure: VIDEO BRONCHOSCOPY WITH ENDOBRONCHIAL NAVIGATION;  Surgeon: Collene Gobble, MD;  Location: [facility] ENDOSCOPY;  Service: Pulmonary;  Laterality: N/A;    FAMILY HISTORY: Family History  Problem Relation Age of Onset   Other Mother        died @ 31 - but was healthy   Hypertension Sister    Diabetes Sister    Diabetes Brother        no longer on medications.    Anesthesia problems Neg Hx    Hypotension Neg Hx    Malignant hyperthermia Neg Hx    Pseudochol deficiency Neg Hx   Patient's father was in his mid 53s years old when he died from unknown causes.  Patient's mother died from "natural causes" at age 27. The patient denies a family hx of breast or ovarian cancer. She has 5 brothers and 1 sister.   GYNECOLOGIC HISTORY:  No LMP recorded. Patient is postmenopausal. 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  She is now retired.  She describes herself a single.  Her daughter Marisa Hua lives in [place].  She has multiple medical problems.  The patient's son died at age 81.  The patient has 1 grandchild, Johnny Bridge, who is buying a home 5 minutes from the patient t and works for [employer].  He has 4 children, 2 have gone through college, one is "in between" and the other 1 is currently in college.  The patient herself attends a local [place] DIRECTIVES: The patient intends to name her grandson Johnny Bridge as her healthcare power of attorney.  He can be reached at [phone].  She was given the appropriate documents to complete and notarize at her discretion at the time of her 07/21/2019 visit   HEALTH MAINTENANCE: Social History   Tobacco Use   Smoking status: Never   Smokeless tobacco: Never  Vaping Use   Vaping Use: Never used  Substance Use Topics   Alcohol use: No   Drug use: No     Allergies  Allergen Reactions   Levofloxacin     Other reaction(s): Unknown   Lisinopril Itching    No energy   Penicillins Itching and Rash    Has patient had a PCN reaction causing immediate rash, facial/tongue/throat swelling, SOB or lightheadedness with hypotension: No Has patient had a PCN reaction causing severe rash involving mucus membranes or skin necrosis: No Has patient had a PCN reaction that required  hospitalization: No Has patient had a PCN reaction occurring within the last 10 years: No If all of the above answers are "NO", then may proceed with Cephalosporin use.   Sulfa Antibiotics Itching and Rash    Current Outpatient Medications  Medication Sig Dispense Refill   acetaminophen (TYLENOL) 500 MG tablet Take 1,000 mg by mouth every 6 (six) hours as needed (pain).     albuterol (PROVENTIL HFA;VENTOLIN HFA) 108 (90 Base) MCG/ACT inhaler Inhale 1-2 puffs into the lungs every 6 (six) hours as needed for wheezing or shortness of breath.      amLODipine (NORVASC) 10 MG tablet Take 1 tablet (10 mg total) by mouth daily. 90 tablet 3   Artificial Tear Solution (  SOOTHE XP OP) Place 1 drop into both eyes in the morning and at bedtime.     Biotin 5 MG TABS Take 5 mg by mouth daily at 12 noon.     cetaphil (CETAPHIL) cream Apply 1 application topically as needed (dry skin).     chlorthalidone (HYGROTON) 25 MG tablet Take 1 tablet (25 mg total) by mouth daily. 90 tablet 3   COVID-19 mRNA vaccine, Moderna, 100 MCG/0.5ML injection Inject into the muscle. 0.5 mL 0   diphenhydrAMINE HCl 50 MG/30ML LIQD Take 50 mg by mouth at bedtime as needed (sleep).     fluticasone (FLONASE) 50 MCG/ACT nasal spray Place 2 sprays into both nostrils daily.     furosemide (LASIX) 20 MG tablet Take 20 mg by mouth daily as needed for fluid or edema.     gabapentin (NEURONTIN) 100 MG capsule Take 1 capsule (100 mg total) by mouth at bedtime.     levothyroxine (SYNTHROID, LEVOTHROID) 88 MCG tablet Take 88 mcg by mouth daily before breakfast.      meclizine (ANTIVERT) 25 MG tablet Take 12.5 mg by mouth 3 (three) times daily as needed for dizziness.     Multiple Vitamins-Minerals (ICAPS AREDS 2 PO) Take 1 capsule by mouth 2 (two) times daily.      OVER THE COUNTER MEDICATION Take 300 mg by mouth daily at 12 noon. ADRENAL SUPPORT WITH ASHWAGANDHA     potassium chloride SA (KLOR-CON) 20 MEQ tablet Take 20 mEq by mouth daily as  needed (when taking furosemide).     valsartan (DIOVAN) 320 MG tablet Take 1 tablet (320 mg total) by mouth daily. 90 tablet 3   No current facility-administered medications for this visit.    OBJECTIVE: African-American woman who appears stated age  51:   07/22/21 1021  BP: (!) 171/52  Pulse: (!) 50  Resp: 18  Temp: (!) 97.5 F (36.4 C)  SpO2: 100%     Body mass index is 34.83 kg/m.   Wt Readings from Last 3 Encounters:  07/22/21 206 lb 2 oz (93.5 kg)  06/04/21 209 lb 6 oz (95 kg)  01/24/21 216 lb 9.6 oz (98.2 kg)      ECOG FS:2 - Symptomatic, <50% confined to bed  Sclerae unicteric, EOMs intact Wearing a mask No cervical or supraclavicular adenopathy Lungs no rales or rhonchi Heart regular rate and rhythm Abd soft, nontender, positive bowel sounds MSK no focal spinal tenderness, had some difficulty getting onto the examination table Neuro: nonfocal, well oriented, appropriate affect Breasts: The right breast is unremarkable.  The left breast is status postlumpectomy and radiation.  There is no evidence of local recurrence.  Both axillae are benign   LAB RESULTS:  CMP     Component Value Date/Time   NA 141 01/24/2021 1627   K 4.0 01/24/2021 1627   CL 103 01/24/2021 1627   CO2 16 (L) 01/24/2021 1627   GLUCOSE 191 (H) 01/24/2021 1627   GLUCOSE 164 (H) 12/20/2020 1456   BUN 25 01/24/2021 1627   CREATININE 0.88 01/24/2021 1627   CREATININE 0.78 07/21/2019 1525   CALCIUM 9.5 01/24/2021 1627   PROT 6.6 12/20/2020 1456   ALBUMIN 3.9 12/20/2020 1456   AST 15 12/20/2020 1456   AST 15 07/21/2019 1525   ALT 14 12/20/2020 1456   ALT 19 07/21/2019 1525   ALKPHOS 83 12/20/2020 1456   BILITOT 0.2 (L) 12/20/2020 1456   BILITOT 0.2 (L) 07/21/2019 1525   GFRNONAA >60 12/20/2020 1456  GFRNONAA >60 07/21/2019 1525   GFRAA 76 12/13/2020 1035   GFRAA >60 07/21/2019 1525    No results found for: TOTALPROTELP, ALBUMINELP, A1GS, A2GS, BETS, BETA2SER, GAMS, MSPIKE,  SPEI  No results found for: KPAFRELGTCHN, LAMBDASER, KAPLAMBRATIO  Lab Results  Component Value Date   WBC 9.3 07/22/2021   NEUTROABS 5.8 07/22/2021   HGB 10.8 (L) 07/22/2021   HCT 33.7 (L) 07/22/2021   MCV 95.7 07/22/2021   PLT 331 07/22/2021   No results found for: LABCA2  No components found for: [id number]  No results for input(s): INR in the last 168 hours.  No results found for: LABCA2  No results found for: WUJ811  No results found for: CAN125  No results found for: BJY782  Lab Results  Component Value Date   CA2729 15.8 12/20/2020    No components found for: HGQUANT  Lab Results  Component Value Date   CEA1 1.24 12/20/2020   /  CEA ([facility])  Date Value Ref Range Status  12/20/2020 1.24 0.00 - 5.00 ng/mL Final    Comment:    (NOTE) This test was performed using Architect's Chemiluminescent Microparticle Immunoassay. Values obtained from different assay methods cannot be used interchangeably. Please note that 5-10% of patients who smoke may see CEA levels up to 6.9 ng/mL. Performed at [facility], [place] [address], [place], [place] [address]      No results found for: AFPTUMOR  No results found for: CHROMOGRNA  No results found for: HGBA, HGBA2QUANT, HGBFQUANT, HGBSQUAN (Hemoglobinopathy evaluation)   No results found for: LDH  No results found for: IRON, TIBC, IRONPCTSAT (Iron and TIBC)  No results found for: FERRITIN  Urinalysis    Component Value Date/Time   COLORURINE YELLOW 07/02/2018 [place] 07/02/2018 1752   LABSPEC 1.021 07/02/2018 1752   PHURINE 5.0 07/02/2018 1752   GLUCOSEU NEGATIVE 07/02/2018 1752   HGBUR NEGATIVE 07/02/2018 1752   BILIRUBINUR NEGATIVE 07/02/2018 1752   KETONESUR 20 (A) 07/02/2018 1752   PROTEINUR NEGATIVE 07/02/2018 1752   UROBILINOGEN 1.0 02/20/2012 0927   NITRITE NEGATIVE 07/02/2018 1752   LEUKOCYTESUR NEGATIVE 07/02/2018 1752    STUDIES: No results  found.   ELIGIBLE FOR AVAILABLE RESEARCH PROTOCOL: no  ASSESSMENT: 86 y.o. [employer], [place] woman status post left breast upper outer quadrant biopsy 06/22/2019 for a clinical T1b N0, stage IB invasive ductal carcinoma, grade 3, triple negative, with an MIB-1-1 of 30%.  (1) status post left lumpectomy and sentinel lymph node sampling 07/27/2019 for a pT1b pN0, stage IB invasive ductal carcinoma, with clear margins.  (a) 2 left axillary lymph nodes were removed  (2) no adjuvant chemotherapy planned  (3) adjuvant radiation 09/07/2019 through 10/06/2019 Site Technique Total Dose (Gy) Dose per Fx (Gy) Completed Fx [employer]  Breast, Left: Breast_Lt 3D 40.05/40.05 2.67 15/15 6X, 10X  Breast, Left: Breast_Lt_Bst specialPort 10/10 2 5/5 15E    LUNG CANCER (4) CT of the chest 11/14/2020 showed a new (as compared to July 2020) right upper lobe nodule measuring 0.9 cm, with no other findings of concern  (a) PET scan 12/07/2020 find the right upper lobe nodule in question to be hypermetabolic, with no other hypermetabolic areas elsewhere in the chest or abdomen  (b) CA 27-29 and CEA obtained 12/20/2020 were in the normal range  (c) bronchoscopic biopsy 12/27/2020 read as consistent with squamous cell carcinoma  (5) radiation to lung with curative intent 02/13/2021 through 02/20/2021 Site Technique Total Dose (Gy)  Dose per Fx (Gy) Completed Fx Be[employer]  Lung, Right: Lung_Rt_RUL IMRT 54/54 18 3/3 6XFFF    (6) follow-up studies:  (A) chest CT 06/03/2021 shows good treatment response, no metastatic disease   PLAN: Alisha [name] is now 2 years out from definitive surgery for her breast cancer with no evidence of disease recurrence.  This is especially favorable since triple negative breast cancer if it is going to recur tend to recur early.  She is behind on her mammography and I have put the order in for her to have bilateral screening mammography this month.  She is half a year out from  diagnosis of stage I squamous cell carcinoma of the lung.  She completed radiation 4 months ago and restaging CT of the chest in August showed a good treatment response.  She is already scheduled to see Dr. Isidore Moos in January.  She will see [place] again a prolonged next year.  Likely we will repeat a CT of the chest summer 2023.  I do not have a simple explanation for her hives.  Staying off with soap so far does not seem to be making much difference.  I do not see any obvious medication on her list that might be causing this.  Also her hemoglobin has drifted down over the past couple of years.  There is no obvious cause of this and the hemoglobin remains well over 10 so she has no symptoms related to t it.  This requires only further follow-up at this point.  Total encounter time 35 minutes.Chauncey Cruel, MD   07/22/2021 10:39 AM Medical Oncology and Hematology [facility] [place], [place] [address] Tel. [phone]    Fax. [phone]   This document serves as a record of services personally performed by Lurline Del, MD. It was created on his behalf by Wilburn Mylar, a trained medical scribe. The creation of this record is based on the scribe's personal observations and the provider's statements to them.   I, Lurline Del MD, have reviewed the above documentation for accuracy and completeness, and I agree with the above.   *Total Encounter Time as defined by the Centers for Medicare and Medicaid Services includes, in addition to the face-to-face time of a patient visit (documented in the note above) non-face-to-face time: obtaining and reviewing outside history, ordering and reviewing medications, tests or procedures, care coordination (communications with other health care professionals or caregivers) and documentation in the medical record.

## 2021-07-22 ENCOUNTER — Inpatient Hospital Stay: Payer: Medicare HMO | Attending: Oncology | Admitting: Oncology

## 2021-07-22 ENCOUNTER — Inpatient Hospital Stay: Payer: Medicare HMO

## 2021-07-22 ENCOUNTER — Other Ambulatory Visit: Payer: Self-pay

## 2021-07-22 VITALS — BP 171/52 | HR 50 | Temp 97.5°F | Resp 18 | Wt 206.1 lb

## 2021-07-22 DIAGNOSIS — C50412 Malignant neoplasm of upper-outer quadrant of left female breast: Secondary | ICD-10-CM

## 2021-07-22 DIAGNOSIS — Z853 Personal history of malignant neoplasm of breast: Secondary | ICD-10-CM | POA: Insufficient documentation

## 2021-07-22 DIAGNOSIS — D649 Anemia, unspecified: Secondary | ICD-10-CM

## 2021-07-22 DIAGNOSIS — Z171 Estrogen receptor negative status [ER-]: Secondary | ICD-10-CM

## 2021-07-22 DIAGNOSIS — Z85118 Personal history of other malignant neoplasm of bronchus and lung: Secondary | ICD-10-CM | POA: Insufficient documentation

## 2021-07-22 DIAGNOSIS — C3411 Malignant neoplasm of upper lobe, right bronchus or lung: Secondary | ICD-10-CM

## 2021-07-22 DIAGNOSIS — Z923 Personal history of irradiation: Secondary | ICD-10-CM | POA: Insufficient documentation

## 2021-07-22 LAB — COMPREHENSIVE METABOLIC PANEL
ALT: 12 U/L (ref 0–44)
AST: 16 U/L (ref 15–41)
Albumin: 4 g/dL (ref 3.5–5.0)
Alkaline Phosphatase: 70 U/L (ref 38–126)
Anion gap: 12 (ref 5–15)
BUN: 19 mg/dL (ref 8–23)
CO2: 24 mmol/L (ref 22–32)
Calcium: 9.4 mg/dL (ref 8.9–10.3)
Chloride: 106 mmol/L (ref 98–111)
Creatinine, Ser: 0.85 mg/dL (ref 0.44–1.00)
GFR, Estimated: >60 mL/min (ref >60)
Glucose, Bld: 173 mg/dL — ABNORMAL HIGH (ref 70–99)
Potassium: 3.6 mmol/L (ref 3.5–5.1)
Sodium: 142 mmol/L (ref 135–145)
Total Bilirubin: 0.4 mg/dL (ref 0.3–1.2)
Total Protein: 6.6 g/dL (ref 6.5–8.1)

## 2021-07-22 LAB — CBC WITH DIFFERENTIAL/PLATELET
Abs Immature Granulocytes: 0.02 10*3/uL (ref 0.00–0.07)
Basophils Absolute: 0.1 10*3/uL (ref 0.0–0.1)
Basophils Relative: 1 %
Eosinophils Absolute: 0.4 10*3/uL (ref 0.0–0.5)
Eosinophils Relative: 5 %
HCT: 33.7 % — ABNORMAL LOW (ref 36.0–46.0)
Hemoglobin: 10.8 g/dL — ABNORMAL LOW (ref 12.0–15.0)
Immature Granulocytes: 0 %
Lymphocytes Relative: 23 %
Lymphs Abs: 2.1 10*3/uL (ref 0.7–4.0)
MCH: 30.7 pg (ref 26.0–34.0)
MCHC: 32 g/dL (ref 30.0–36.0)
MCV: 95.7 fL (ref 80.0–100.0)
Monocytes Absolute: 0.8 10*3/uL (ref 0.1–1.0)
Monocytes Relative: 8 %
Neutro Abs: 5.8 10*3/uL (ref 1.7–7.7)
Neutrophils Relative %: 63 %
Platelets: 331 10*3/uL (ref 150–400)
RBC: 3.52 MIL/uL — ABNORMAL LOW (ref 3.87–5.11)
RDW: 13.1 % (ref 11.5–15.5)
WBC: 9.3 10*3/uL (ref 4.0–10.5)
nRBC: 0 % (ref 0.0–0.2)

## 2021-08-14 DIAGNOSIS — I10 Essential (primary) hypertension: Secondary | ICD-10-CM | POA: Diagnosis not present

## 2021-08-14 DIAGNOSIS — E039 Hypothyroidism, unspecified: Secondary | ICD-10-CM | POA: Diagnosis not present

## 2021-08-14 DIAGNOSIS — J45909 Unspecified asthma, uncomplicated: Secondary | ICD-10-CM | POA: Diagnosis not present

## 2021-08-14 DIAGNOSIS — H35039 Hypertensive retinopathy, unspecified eye: Secondary | ICD-10-CM | POA: Diagnosis not present

## 2021-08-14 DIAGNOSIS — E1169 Type 2 diabetes mellitus with other specified complication: Secondary | ICD-10-CM | POA: Diagnosis not present

## 2021-08-14 DIAGNOSIS — J45998 Other asthma: Secondary | ICD-10-CM | POA: Diagnosis not present

## 2021-08-14 DIAGNOSIS — E785 Hyperlipidemia, unspecified: Secondary | ICD-10-CM | POA: Diagnosis not present

## 2021-08-14 DIAGNOSIS — H409 Unspecified glaucoma: Secondary | ICD-10-CM | POA: Diagnosis not present

## 2021-08-14 DIAGNOSIS — M1990 Unspecified osteoarthritis, unspecified site: Secondary | ICD-10-CM | POA: Diagnosis not present

## 2021-08-27 ENCOUNTER — Telehealth: Payer: Self-pay | Admitting: *Deleted

## 2021-08-27 NOTE — Telephone Encounter (Signed)
CALLED PATIENT TO INFORM OF CT FOR 09-05-21- ARRIVAL TIME- 1:15 PM @ [facility], NO RESTRICTIONS TO TEST AND PATIENT TO FOLLOW UP WITH DR. Isidore [name] FOR RESULTS ON 09-10-21 @ 2 PM FOR RESULTS, [name]

## 2021-09-05 ENCOUNTER — Ambulatory Visit (HOSPITAL_COMMUNITY)
Admission: RE | Admit: 2021-09-05 | Discharge: 2021-09-05 | Disposition: A | Discharge status: Home / Self Care | Payer: Medicare HMO | Source: Ambulatory Visit | Attending: Radiation Oncology | Admitting: Radiation Oncology

## 2021-09-05 ENCOUNTER — Other Ambulatory Visit: Payer: Self-pay

## 2021-09-05 ENCOUNTER — Encounter (HOSPITAL_COMMUNITY): Payer: Self-pay

## 2021-09-05 DIAGNOSIS — C3490 Malignant neoplasm of unspecified part of unspecified bronchus or lung: Secondary | ICD-10-CM | POA: Diagnosis not present

## 2021-09-05 DIAGNOSIS — R911 Solitary pulmonary nodule: Secondary | ICD-10-CM | POA: Diagnosis not present

## 2021-09-05 DIAGNOSIS — I700 Atherosclerosis of aorta: Secondary | ICD-10-CM | POA: Diagnosis not present

## 2021-09-05 DIAGNOSIS — C3411 Malignant neoplasm of upper lobe, right bronchus or lung: Secondary | ICD-10-CM | POA: Diagnosis not present

## 2021-09-09 ENCOUNTER — Other Ambulatory Visit: Payer: Self-pay

## 2021-09-09 ENCOUNTER — Encounter: Payer: Self-pay | Admitting: Podiatry

## 2021-09-09 ENCOUNTER — Ambulatory Visit: Payer: Medicare HMO | Admitting: Podiatry

## 2021-09-09 DIAGNOSIS — H409 Unspecified glaucoma: Secondary | ICD-10-CM | POA: Insufficient documentation

## 2021-09-09 DIAGNOSIS — I498 Other specified cardiac arrhythmias: Secondary | ICD-10-CM | POA: Insufficient documentation

## 2021-09-09 DIAGNOSIS — G629 Polyneuropathy, unspecified: Secondary | ICD-10-CM | POA: Diagnosis not present

## 2021-09-09 DIAGNOSIS — M79674 Pain in right toe(s): Secondary | ICD-10-CM | POA: Diagnosis not present

## 2021-09-09 DIAGNOSIS — C3490 Malignant neoplasm of unspecified part of unspecified bronchus or lung: Secondary | ICD-10-CM | POA: Insufficient documentation

## 2021-09-09 DIAGNOSIS — B351 Tinea unguium: Secondary | ICD-10-CM

## 2021-09-09 DIAGNOSIS — Z853 Personal history of malignant neoplasm of breast: Secondary | ICD-10-CM | POA: Insufficient documentation

## 2021-09-09 DIAGNOSIS — E1169 Type 2 diabetes mellitus with other specified complication: Secondary | ICD-10-CM | POA: Insufficient documentation

## 2021-09-09 DIAGNOSIS — E559 Vitamin D deficiency, unspecified: Secondary | ICD-10-CM | POA: Insufficient documentation

## 2021-09-09 DIAGNOSIS — E785 Hyperlipidemia, unspecified: Secondary | ICD-10-CM | POA: Insufficient documentation

## 2021-09-09 DIAGNOSIS — I700 Atherosclerosis of aorta: Secondary | ICD-10-CM | POA: Insufficient documentation

## 2021-09-09 DIAGNOSIS — H3530 Unspecified macular degeneration: Secondary | ICD-10-CM | POA: Insufficient documentation

## 2021-09-09 DIAGNOSIS — E039 Hypothyroidism, unspecified: Secondary | ICD-10-CM | POA: Insufficient documentation

## 2021-09-09 DIAGNOSIS — H35039 Hypertensive retinopathy, unspecified eye: Secondary | ICD-10-CM | POA: Insufficient documentation

## 2021-09-09 DIAGNOSIS — L84 Corns and callosities: Secondary | ICD-10-CM | POA: Diagnosis not present

## 2021-09-09 DIAGNOSIS — L299 Pruritus, unspecified: Secondary | ICD-10-CM | POA: Insufficient documentation

## 2021-09-09 DIAGNOSIS — M79675 Pain in left toe(s): Secondary | ICD-10-CM | POA: Diagnosis not present

## 2021-09-09 DIAGNOSIS — M1990 Unspecified osteoarthritis, unspecified site: Secondary | ICD-10-CM | POA: Insufficient documentation

## 2021-09-09 DIAGNOSIS — Z79890 Hormone replacement therapy (postmenopausal): Secondary | ICD-10-CM | POA: Insufficient documentation

## 2021-09-09 DIAGNOSIS — R739 Hyperglycemia, unspecified: Secondary | ICD-10-CM | POA: Insufficient documentation

## 2021-09-09 NOTE — Progress Notes (Deleted)
ANNUAL DIABETIC FOOT EXAM  Subjective: Alisha [name] presents today for {jgcomplaint:23593}.  Patient relates *** year h/o diabetes.  Patient *** h/o foot wounds.  Patient *** symptoms of foot numbness.  Patient *** symptoms of foot tingling.  Patient *** symptoms of burning in feet.  Patient *** symptoms of pins/needles in feet.  Patient denies any numbness, tingling, burning, or pins/needle sensation in feet.  Patient has been diagnosed with neuropathy and it is managed with ***.  Patient's blood sugar was *** mg/dl ***. Patient did not check blood glucose this morning.  Patient does not monitor blood glucose daily.  Alisha Pepper, MD is patient's PCP. Last visit was ***.  Past Medical History:  Diagnosis Date   Arthritis    Asthma    Never smoker   Blood transfusion    over 40 y ears   Breast mass, left    Cancer [facility])    History of radiation therapy 09/07/19- 10/06/19   Left Breast 15 fractions of 2.67 Gy to total 40.05 Gy. 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   Glaucoma 09/09/2021   Hardening of the aorta (main artery of the heart) ([place]) 09/09/2021   Hyperglycemia 09/09/2021   Hyperlipidemia 09/09/2021   Hypertensive retinopathy 09/09/2021   Hypothyroidism 09/09/2021   Macular degeneration 09/09/2021   Osteoarthritis 09/09/2021   Personal history of malignant neoplasm of breast 09/09/2021   Postmenopausal hormone replacement therapy 09/09/2021   Primary malignant neoplasm of lung ([place]) 09/09/2021   Pruritus 09/09/2021   Type 2 diabetes mellitus with other specified complication ([place]) 31/49/7026   Vitamin D deficiency 09/09/2021   Anemia, normocytic normochromic 07/22/2021   Asthma 01/24/2021   Allergic rhinitis 01/24/2021   Malignant neoplasm of upper lobe of right lung  ([place]) 01/23/2021   Bowel incontinence 11/22/2020   Constipation 11/22/2020   History of colonic polyps 11/22/2020   Internal hemorrhoids 11/22/2020   Obesity 11/22/2020   Vomiting without nausea 37/85/8850   Diastolic dysfunction with heart failure ([place]) 06/16/2020   Essential hypertension 03/12/2020   Preoperative cardiovascular examination 07/20/2019   Malignant neoplasm of upper-outer quadrant of left breast in female, estrogen receptor negative ([place]) 06/29/2019   Fatigue 04/21/2019   Bradycardia with 41-50 beats per minute 04/21/2019   DOE (dyspnea on exertion) 04/21/2019   Midline cystocele 05/08/2017   Arthritis of knee 02/20/2012   Past Surgical History:  Procedure Laterality Date   ABDOMINAL HYSTERECTOMY     32 yrs   BREAST LUMPECTOMY WITH RADIOACTIVE SEED AND SENTINEL LYMPH NODE BIOPSY Left 07/27/2019   Procedure: LEFT BREAST LUMPECTOMY WITH RADIOACTIVE SEED AND LEFT SENTINEL LYMPH NODE MAPPING;  Surgeon: Erroll Luna, MD;  Location: [place];  Service: General;  Laterality: Left;   BREAST LUMPECTOMY WITH RADIOACTIVE SEED LOCALIZATION Left 08/11/2016   Procedure: LEFT BREAST LUMPECTOMY WITH RADIOACTIVE SEED LOCALIZATION;  Surgeon: Erroll Luna, MD;  Location: [place];  Service: General;  Laterality: Left;   BRONCHIAL BIOPSY  12/27/2020   Procedure: BRONCHIAL BIOPSIES;  Surgeon: Collene Gobble, MD;  Location: [facility] ENDOSCOPY;  Service: Pulmonary;;   BRONCHIAL BRUSHINGS  12/27/2020   Procedure: BRONCHIAL BRUSHINGS;  Surgeon: Collene Gobble, MD;  Location: [facility] ENDOSCOPY;  Service: Pulmonary;;   BRONCHIAL NEEDLE ASPIRATION BIOPSY  12/27/2020   Procedure: BRONCHIAL NEEDLE ASPIRATION BIOPSIES;  Surgeon: Collene Gobble, MD;  Location: [place];  Service: Pulmonary;;   BRONCHIAL WASHINGS  12/27/2020   Procedure: BRONCHIAL WASHINGS;  Surgeon: Collene Gobble, MD;  Location: [facility] ENDOSCOPY;  Service: Pulmonary;;   CATARACT EXTRACTION, BILATERAL     CORONARY CALCIUM  SCORE-CT ANGIOGRAM  04/2019   Small, < 69mm nodule noted.  Difficult study to read.  Coronary calcium score was 11.  Significant motion artifact, but visible territory showed minimal disease.  Minimal calcium noted in the ostial LAD but no suggestion of significant CAD.   EYE SURGERY     FIDUCIAL MARKER PLACEMENT  12/27/2020   Procedure: FIDUCIAL MARKER PLACEMENT;  Surgeon: Collene Gobble, MD;  Location: [facility] ENDOSCOPY;  Service: Pulmonary;;   JOINT REPLACEMENT Bilateral    left knee 2012   KNEE ARTHROPLASTY  02/17/2012   Procedure: COMPUTER ASSISTED TOTAL KNEE ARTHROPLASTY;  Surgeon: Meredith Pel, MD;  Location: [place];  Service: Orthopedics;  Laterality: Right;  Right total knee replacement   TRANSTHORACIC ECHOCARDIOGRAM  04/02/2020   Normal LV function.  No R WMA.  Moderate LVH.  GRII DD with elevated filling pressures.  Moderate LA dilation with trivial MR.  Mild aortic valve sclerosis.   VIDEO BRONCHOSCOPY WITH ENDOBRONCHIAL NAVIGATION N/A 12/27/2020   Procedure: VIDEO BRONCHOSCOPY WITH ENDOBRONCHIAL NAVIGATION;  Surgeon: Collene Gobble, MD;  Location: [place] ENDOSCOPY;  Service: Pulmonary;  Laterality: N/A;   Current Outpatient Medications on File Prior to Visit  Medication Sig Dispense Refill   acetaminophen (TYLENOL) 500 MG tablet Take 1,000 mg by mouth every 6 (six) hours as needed (pain).     albuterol (PROVENTIL HFA;VENTOLIN HFA) 108 (90 Base) MCG/ACT inhaler Inhale 1-2 puffs into the lungs every 6 (six) hours as needed for wheezing or shortness of breath.      amLODipine (NORVASC) 10 MG tablet Take 1 tablet (10 mg total) by mouth daily. 90 tablet 3   Artificial Tear Solution (SOOTHE XP OP) Place 1 drop into both eyes in the morning and at bedtime.     Biotin 5 MG TABS Take 5 mg by mouth daily at 12 noon.     cetaphil (CETAPHIL) cream Apply 1 application topically as needed (dry skin).     chlorthalidone (HYGROTON) 25 MG tablet Take 1 tablet (25 mg total) by mouth daily. 90 tablet 3    COVID-19 mRNA vaccine, Moderna, 100 MCG/0.5ML injection Inject into the muscle. 0.5 mL 0   diclofenac Sodium (VOLTAREN) 1 % GEL See admin instructions.     diphenhydrAMINE HCl 50 MG/30ML LIQD Take 50 mg by mouth at bedtime as needed (sleep).     fluticasone (FLONASE) 50 MCG/ACT nasal spray Place 2 sprays into both nostrils daily.     furosemide (LASIX) 20 MG tablet Take 20 mg by mouth daily as needed for fluid or edema.     gabapentin (NEURONTIN) 100 MG capsule Take 1 capsule (100 mg total) by mouth at bedtime.     levothyroxine (SYNTHROID, LEVOTHROID) 88 MCG tablet Take 88 mcg by mouth daily before breakfast.      meclizine (ANTIVERT) 25 MG tablet Take 12.5 mg by mouth 3 (three) times daily as needed for dizziness.     Multiple Vitamins-Minerals (ICAPS AREDS 2 PO) Take 1 capsule by mouth 2 (two) times daily.      OVER THE COUNTER MEDICATION Take 300 mg by mouth daily at 12 noon. ADRENAL SUPPORT WITH ASHWAGANDHA  potassium chloride SA (KLOR-CON) 20 MEQ tablet Take 20 mEq by mouth daily as needed (when taking furosemide).     valsartan (DIOVAN) 320 MG tablet Take 1 tablet (320 mg total) by mouth daily. 90 tablet 3   No current facility-administered medications on file prior to visit.    Allergies  Allergen Reactions   Doxepin Hcl     Other reaction(s): Body swelling   Estradiol     Other reaction(s): itching   Gabapentin     Other reaction(s): swelling   Levofloxacin     Other reaction(s): Unknown   Lisinopril Itching    No energy Other reaction(s): itching, hair loss   Nebivolol Hcl     Other reaction(s): fatigue   Norvasc [Amlodipine]     Other reaction(s): swelling, ha, itching   Olmesartan     Other reaction(s): hair loss, nail changes   Tramadol Hcl     Other reaction(s): abdominal pain   Penicillins Itching and Rash    Has patient had a PCN reaction causing immediate rash, facial/tongue/throat swelling, SOB or lightheadedness with hypotension: No Has patient had a PCN  reaction causing severe rash involving mucus membranes or skin necrosis: No Has patient had a PCN reaction that required hospitalization: No Has patient had a PCN reaction occurring within the last 10 years: No If all of the above answers are "NO", then may proceed with Cephalosporin use.   Sulfa Antibiotics Itching and Rash   Social History   Occupational History   Not on file  Tobacco Use   Smoking status: Never   Smokeless tobacco: Never  Vaping Use   Vaping Use: Never used  Substance and Sexual Activity   Alcohol use: No   Drug use: No   Sexual activity: Not Currently   Family History  Problem Relation Age of Onset   Other Mother        died @ 41 - but was healthy   Hypertension Sister    Diabetes Sister    Diabetes Brother        no longer on medications.    Anesthesia problems Neg Hx    Hypotension Neg Hx    Malignant hyperthermia Neg Hx    Pseudochol deficiency Neg Hx    Immunization History  Administered Date(s) Administered   DTP 08/04/2001   Influenza Split 07/30/2008, 08/13/2009, 08/25/2011, 08/19/2012, 07/27/2013, 09/26/2019, 09/17/2020   Influenza, High Dose Seasonal PF 09/26/2019   Influenza,inj,Quad PF,6+ Mos 08/28/2016, 09/04/2017, 10/08/2018   Influenza,inj,quad, With Preservative 09/06/2014, 10/19/2015   Moderna SARS-COV2 Booster Vaccination 04/03/2021   PFIZER(Purple Top)SARS-COV-2 Vaccination 12/19/2019, 01/09/2020, 08/14/2020   Pneumococcal Conjugate-13 02/22/2014   Pneumococcal Polysaccharide-23 08/05/2003   Zoster Recombinat (Shingrix) 08/28/2017   Zoster, Live 03/02/2017, 08/28/2017     Review of Systems: Negative except as noted in the HPI.   Objective: There were no vitals filed for this visit.  Alisha [name] is a pleasant 86 y.o. female in NAD. AAO X 3.  Vascular Examination: {jgvascular:23595}  Dermatological Examination: {jgderm:23598}  Musculoskeletal Examination: {jgmsk:23600::"Normal muscle strength 5/5 to all lower  extremity muscle groups bilaterally. No pain, crepitus or joint limitation noted with ROM b/l LE. No gross bony pedal deformities b/l. Patient ambulates independently without assistive aids."}  Footwear Assessment: Does the patient wear appropriate shoes? {Yes,No}. Does the patient need inserts/orthotics? {Yes,No}.  Neurological Examination: {jgneuro:23601::"Protective sensation intact 5/5 intact bilaterally with 10g monofilament b/l.","Vibratory sensation intact b/l.","Proprioception intact bilaterally."}  No flowsheet data found.   Assessment: No diagnosis found.  ADA Risk Categorization: Low Risk :  Patient has all of the following: Intact protective sensation No prior foot ulcer  No severe deformity Pedal pulses present  High Risk  Patient has one or more of the following: Loss of protective sensation Absent pedal pulses Severe Foot deformity History of foot ulcer  Plan: {jgplan:23602::"-Patient/POA to call should there be question/concern in the interim."}  Return in about 3 months (around 12/10/2021).  Marzetta Board, DPM

## 2021-09-10 ENCOUNTER — Other Ambulatory Visit: Payer: Self-pay

## 2021-09-10 ENCOUNTER — Encounter: Payer: Self-pay | Admitting: Radiation Oncology

## 2021-09-10 ENCOUNTER — Ambulatory Visit
Admission: RE | Admit: 2021-09-10 | Discharge: 2021-09-10 | Disposition: A | Discharge status: Home / Self Care | Payer: Medicare HMO | Source: Ambulatory Visit | Attending: Radiation Oncology | Admitting: Radiation Oncology

## 2021-09-10 VITALS — BP 154/45 | HR 60 | Temp 97.6°F | Resp 20 | Ht 64.5 in | Wt 201.0 lb

## 2021-09-10 DIAGNOSIS — E039 Hypothyroidism, unspecified: Secondary | ICD-10-CM | POA: Diagnosis not present

## 2021-09-10 DIAGNOSIS — C3491 Malignant neoplasm of unspecified part of right bronchus or lung: Secondary | ICD-10-CM

## 2021-09-10 DIAGNOSIS — I700 Atherosclerosis of aorta: Secondary | ICD-10-CM | POA: Insufficient documentation

## 2021-09-10 DIAGNOSIS — E1169 Type 2 diabetes mellitus with other specified complication: Secondary | ICD-10-CM | POA: Diagnosis not present

## 2021-09-10 DIAGNOSIS — C3411 Malignant neoplasm of upper lobe, right bronchus or lung: Secondary | ICD-10-CM | POA: Diagnosis not present

## 2021-09-10 DIAGNOSIS — K449 Diaphragmatic hernia without obstruction or gangrene: Secondary | ICD-10-CM | POA: Diagnosis not present

## 2021-09-10 DIAGNOSIS — Z08 Encounter for follow-up examination after completed treatment for malignant neoplasm: Secondary | ICD-10-CM | POA: Diagnosis not present

## 2021-09-10 DIAGNOSIS — Z923 Personal history of irradiation: Secondary | ICD-10-CM | POA: Diagnosis not present

## 2021-09-10 DIAGNOSIS — M1990 Unspecified osteoarthritis, unspecified site: Secondary | ICD-10-CM | POA: Diagnosis not present

## 2021-09-10 DIAGNOSIS — E785 Hyperlipidemia, unspecified: Secondary | ICD-10-CM | POA: Diagnosis not present

## 2021-09-10 DIAGNOSIS — Z79899 Other long term (current) drug therapy: Secondary | ICD-10-CM | POA: Insufficient documentation

## 2021-09-10 DIAGNOSIS — J45909 Unspecified asthma, uncomplicated: Secondary | ICD-10-CM | POA: Diagnosis not present

## 2021-09-10 DIAGNOSIS — I10 Essential (primary) hypertension: Secondary | ICD-10-CM | POA: Diagnosis not present

## 2021-09-10 DIAGNOSIS — J45998 Other asthma: Secondary | ICD-10-CM | POA: Diagnosis not present

## 2021-09-10 NOTE — Progress Notes (Signed)
Radiation Oncology         [phone] ________________________________  Name: Alisha [name] MRN: [id number]  Date: 09/10/2021  DOB: [birth year]  Follow-Up Visit Note  Outpatient  CC: Alisha Pepper, MD  Alisha Pepper, MD  Diagnosis and Prior Radiotherapy:    ICD-10-CM   1. Malignant neoplasm of upper lobe of right lung [facility])  C34.11       Radiation Treatment Dates: 02/13/2021 through 02/20/2021 Site Technique Total Dose (Gy) Dose per Fx (Gy) Completed Fx Beam Energies  Lung, Right: Lung_[id number]_[id number] IMRT 54/54 18 3/3 6XFFF   CHIEF COMPLAINT: Here for follow-up and surveillance of lung cancer  Narrative:   [name] presents today for follow-up after completing radiation to her right upper lung on 02/20/2021, and to receive CT scan results from 09/05/2021  Reports she feels significantly better compared to the last time she was here. Reports increase in energy and motivation to exercise. States she is only having to use her albuterol inhaler in the mornings (additional use only if she's outside with her dog for an extended period). Denies a dry or productive cough. Continues to only be able to tolerate softer foods due to her gums still healing from previous oral surgery--but report those are improving slowly, and she's hopeful to be fitted for new dentures soon.   Wt Readings from Last 3 Encounters:  09/10/21 201 lb (91.2 kg)  07/22/21 206 lb 2 oz (93.5 kg)  06/04/21 209 lb 6 oz (95 kg)     ALLERGIES:  is allergic to doxepin hcl, estradiol, gabapentin, levofloxacin, lisinopril, nebivolol hcl, norvasc [amlodipine], olmesartan, tramadol hcl, penicillins, and sulfa antibiotics.  Meds: Current Outpatient Medications  Medication Sig Dispense Refill   acetaminophen (TYLENOL) 500 MG tablet Take 1,000 mg by mouth every 6 (six) hours as needed (pain).     albuterol (PROVENTIL HFA;VENTOLIN HFA) 108 (90 Base) MCG/ACT inhaler Inhale 1-2 puffs into the lungs every 6 (six) hours as needed  for wheezing or shortness of breath.      amLODipine (NORVASC) 10 MG tablet Take 1 tablet (10 mg total) by mouth daily. 90 tablet 3   Artificial Tear Solution (SOOTHE XP OP) Place 1 drop into both eyes in the morning and at bedtime.     Biotin 5 MG TABS Take 5 mg by mouth daily at 12 noon.     cetaphil (CETAPHIL) cream Apply 1 application topically as needed (dry skin).     chlorthalidone (HYGROTON) 25 MG tablet Take 1 tablet (25 mg total) by mouth daily. 90 tablet 3   COVID-19 mRNA vaccine, Moderna, 100 MCG/0.5ML injection Inject into the muscle. 0.5 mL 0   diclofenac Sodium (VOLTAREN) 1 % GEL See admin instructions.     diphenhydrAMINE HCl 50 MG/30ML LIQD Take 50 mg by mouth at bedtime as needed (sleep).     fluticasone (FLONASE) 50 MCG/ACT nasal spray Place 2 sprays into both nostrils daily.     furosemide (LASIX) 20 MG tablet Take 20 mg by mouth daily as needed for fluid or edema.     gabapentin (NEURONTIN) 100 MG capsule Take 1 capsule (100 mg total) by mouth at bedtime.     levothyroxine (SYNTHROID, LEVOTHROID) 88 MCG tablet Take 88 mcg by mouth daily before breakfast.      losartan (COZAAR) 25 MG tablet Take 25 mg by mouth daily.     meclizine (ANTIVERT) 25 MG tablet Take 12.5 mg by mouth 3 (three) times daily as needed for dizziness.  Multiple Vitamins-Minerals (ICAPS AREDS 2 PO) Take 1 capsule by mouth 2 (two) times daily.      OVER THE COUNTER MEDICATION Take 300 mg by mouth daily at 12 noon. ADRENAL SUPPORT WITH ASHWAGANDHA     potassium chloride SA (KLOR-CON) 20 MEQ tablet Take 20 mEq by mouth daily as needed (when taking furosemide).     valsartan (DIOVAN) 320 MG tablet Take 1 tablet (320 mg total) by mouth daily. 90 tablet 3   No current facility-administered medications for this encounter.    Physical Findings: The patient is in no acute distress. Patient is alert and oriented.  height is 5' 4.5" (1.638 m) and weight is 201 lb (91.2 kg). Her temperature is 97.6 F (36.4  C). Her blood pressure is 154/45 (abnormal) and her pulse is 60. Her respiration is 20 and oxygen saturation is 100%. Marland Kitchen    HEART RRR upon ausculation CHEST CTAB DQQ:[id number]    Lab Findings: Lab Results  Component Value Date   WBC 9.3 07/22/2021   HGB 10.8 (L) 07/22/2021   HCT 33.7 (L) 07/22/2021   MCV 95.7 07/22/2021   PLT 331 07/22/2021    Radiographic Findings:  CT Chest Wo Contrast  Result Date: 09/06/2021 CLINICAL DATA:  Restaging non-small cell lung cancer. EXAM: CT CHEST WITHOUT CONTRAST TECHNIQUE: Multidetector CT imaging of the chest was performed following the standard protocol without IV contrast. COMPARISON:  06/03/2021 FINDINGS: Cardiovascular: Normal heart size. No pericardial effusion. Aortic atherosclerosis. Coronary artery atherosclerotic calcifications. Mediastinum/Nodes: Normal appearance of the thyroid gland. The trachea appears patent and is midline. Normal appearance of the esophagus. Small hiatal hernia. No axillary or supraclavicular adenopathy. No enlarged mediastinal lymph nodes. Hilar lymph nodes are suboptimally evaluated due to lack of IV contrast. Lungs/Pleura: There is no pleural effusion. Fibrosis and masslike architectural distortion within the right upper lobe is again identified with fiducial markers. Findings are compatible with changes secondary to external beam radiation. This area measures 5.3 x 2.1 cm, image 32/7. Previously 6.1 x 3.0 cm. Tiny remnant nodule referenced previously measures 4 mm, image 35/7. This is compared with 5 mm previously. Multiple other small right lung nodules are unchanged. No new or enlarging lung nodules. Upper Abdomen: No acute abnormality within the imaged portions of the upper abdomen. Aortic atherosclerosis noted. Musculoskeletal: Degenerative disc disease noted within the thoracic spine. Postsurgical changes within the medial left breast is unchanged. IMPRESSION: 1. Interval decrease in the appearance of radiation change  within the right upper lobe. No findings to suggest local tumor recurrence or metastatic disease. 2. Small right lung nodules are unchanged from previous exam, previously characterized as benign. 3. Coronary artery calcifications noted. 4. Small hiatal hernia. 5. Aortic Atherosclerosis (ICD10-I70.0). Electronically Signed   By: Kerby Moors M.D.   On: 09/06/2021 16:52    Impression/Plan: I have personally reviewed her CT chest images.  Excellent response to SBRT to right upper lung tumor.  Remainder of lungs remain stable.  She will follow-up with me in 4 mo with repeat imaging at that time.  On date of service, in total, I spent 20 minutes on this encounter. Patient was seen in person.  _____________________________________   Eppie Gibson, MD

## 2021-09-10 NOTE — Progress Notes (Signed)
[name] presents today for follow-up after completing radiation to her right upper lung on 02/20/2021, and to receive CT scan results from 09/05/2021  Reports she feels significantly better compared to the last time she was here. Reports increase in energy and motivation to exercise. States she is only having to use her albuterol inhaler in the mornings (additional use only if she's outside with her dog for an extended period). Denies a dry or productive cough. Continues to only be able to tolerate softer foods due to her gums still healing from previous oral surgery--but report those are improving slowly, and she's hopeful to be fitted for new dentures soon.   Wt Readings from Last 3 Encounters:  09/10/21 201 lb (91.2 kg)  07/22/21 206 lb 2 oz (93.5 kg)  06/04/21 209 lb 6 oz (95 kg)

## 2021-09-11 ENCOUNTER — Other Ambulatory Visit: Payer: Self-pay

## 2021-09-11 ENCOUNTER — Ambulatory Visit
Admission: RE | Admit: 2021-09-11 | Discharge: 2021-09-11 | Disposition: A | Discharge status: Home / Self Care | Payer: Medicare HMO | Source: Ambulatory Visit | Attending: Oncology | Admitting: Oncology

## 2021-09-11 DIAGNOSIS — C50412 Malignant neoplasm of upper-outer quadrant of left female breast: Secondary | ICD-10-CM

## 2021-09-11 DIAGNOSIS — Z1231 Encounter for screening mammogram for malignant neoplasm of breast: Secondary | ICD-10-CM | POA: Diagnosis not present

## 2021-09-11 DIAGNOSIS — D649 Anemia, unspecified: Secondary | ICD-10-CM

## 2021-09-11 DIAGNOSIS — C3411 Malignant neoplasm of upper lobe, right bronchus or lung: Secondary | ICD-10-CM

## 2021-09-13 NOTE — Progress Notes (Signed)
Subjective:  Patient ID: Alisha [name], female    DOB: [birth year],  MRN: [id number]  Alisha Roys Bring presents to clinic today for at risk foot care with history of peripheral neuropathy and callus(es) bilaterally and painful thick toenails that are difficult to trim. Painful toenails interfere with ambulation. Aggravating factors include wearing enclosed shoe gear. Pain is relieved with periodic professional debridement. Painful calluses are aggravated when weightbearing with and without shoegear. Pain is relieved with periodic professional debridement.  Patient states she is not diabetic and is declining foot examination on today's visit. She voices no new pedal problems on today's visit.  PCP is [name], MD , and last visit was 08/08/2021.  Allergies  Allergen Reactions   Doxepin Hcl     Other reaction(s): Body swelling   Estradiol     Other reaction(s): itching   Gabapentin     Other reaction(s): swelling   Levofloxacin     Other reaction(s): Unknown   Lisinopril Itching    No energy Other reaction(s): itching, hair loss   Nebivolol Hcl     Other reaction(s): fatigue   Norvasc [Amlodipine]     Other reaction(s): swelling, ha, itching   Olmesartan     Other reaction(s): hair loss, nail changes   Tramadol Hcl     Other reaction(s): abdominal pain   Penicillins Itching and Rash    Has patient had a PCN reaction causing immediate rash, facial/tongue/throat swelling, SOB or lightheadedness with hypotension: No Has patient had a PCN reaction causing severe rash involving mucus membranes or skin necrosis: No Has patient had a PCN reaction that required hospitalization: No Has patient had a PCN reaction occurring within the last 10 years: No If all of the above answers are "NO", then may proceed with Cephalosporin use.   Sulfa Antibiotics Itching and Rash    Review of Systems: Negative except as noted in the HPI. Objective:   Constitutional Ciria Viola Calligan  is a pleasant 86 y.o. African American female, WD, WN in NAD. AAO x 3.   Vascular CFT <3 seconds b/l LE. Palpable DP/PT pulses b/l LE. Digital hair sparse b/l. Skin temperature gradient WNL b/l. No pain with calf compression b/l. No edema noted b/l. No cyanosis or clubbing noted b/l LE. No cyanosis or clubbing noted.  Neurologic Normal speech. Oriented to person, place, and time. Pt has subjective symptoms of neuropathy. Protective sensation decreased with 10 gram monofilament b/l.  Dermatologic Pedal integument with normal turgor, texture and tone b/l LE. No open wounds b/l. No interdigital macerations b/l. Toenails 1-5 b/l elongated, thickened, discolored with subungual debris. +Tenderness with dorsal palpation of nailplates. Hyperkeratotic lesion(s) noted submet head 3 right foot and submet head 5 b/l.  Orthopedic: Hammertoe(s) noted to the L hallux, L 2nd toe, and L 3rd toe.   Radiographs: None Assessment:   1. Pain due to onychomycosis of toenails of both feet   2. Callus   3. Neuropathy    Plan:  Patient was evaluated and treated and all questions answered. Consent given for treatment as described below: -Patient to continue soft, supportive shoe gear daily. -Mycotic toenails 1-5 bilaterally were debrided in length and girth with sterile nail nippers and dremel without incident. -Callus(es) submet head 3 right foot and submet head 5 b/l pared utilizing sterile scalpel blade without complication or incident. Total number debrided =3. -Patient/POA to call should there be question/concern in the interim.  Return in about 3 months (around 12/10/2021).  Marzetta Board, DPM

## 2021-09-17 ENCOUNTER — Other Ambulatory Visit: Payer: Self-pay | Admitting: Oncology

## 2021-09-17 DIAGNOSIS — R928 Other abnormal and inconclusive findings on diagnostic imaging of breast: Secondary | ICD-10-CM

## 2021-10-17 ENCOUNTER — Other Ambulatory Visit: Payer: Self-pay | Admitting: Cardiology

## 2021-10-18 ENCOUNTER — Ambulatory Visit
Admission: RE | Admit: 2021-10-18 | Discharge: 2021-10-18 | Disposition: A | Discharge status: Home / Self Care | Payer: Medicare HMO | Source: Ambulatory Visit | Attending: Oncology | Admitting: Oncology

## 2021-10-18 ENCOUNTER — Other Ambulatory Visit: Payer: Self-pay | Admitting: Oncology

## 2021-10-18 DIAGNOSIS — J45998 Other asthma: Secondary | ICD-10-CM | POA: Diagnosis not present

## 2021-10-18 DIAGNOSIS — R928 Other abnormal and inconclusive findings on diagnostic imaging of breast: Secondary | ICD-10-CM

## 2021-10-18 DIAGNOSIS — E039 Hypothyroidism, unspecified: Secondary | ICD-10-CM | POA: Diagnosis not present

## 2021-10-18 DIAGNOSIS — N6320 Unspecified lump in the left breast, unspecified quadrant: Secondary | ICD-10-CM

## 2021-10-18 DIAGNOSIS — J45909 Unspecified asthma, uncomplicated: Secondary | ICD-10-CM | POA: Diagnosis not present

## 2021-10-18 DIAGNOSIS — M1990 Unspecified osteoarthritis, unspecified site: Secondary | ICD-10-CM | POA: Diagnosis not present

## 2021-10-18 DIAGNOSIS — E1169 Type 2 diabetes mellitus with other specified complication: Secondary | ICD-10-CM | POA: Diagnosis not present

## 2021-10-18 DIAGNOSIS — R922 Inconclusive mammogram: Secondary | ICD-10-CM | POA: Diagnosis not present

## 2021-10-18 DIAGNOSIS — I10 Essential (primary) hypertension: Secondary | ICD-10-CM | POA: Diagnosis not present

## 2021-10-18 DIAGNOSIS — E785 Hyperlipidemia, unspecified: Secondary | ICD-10-CM | POA: Diagnosis not present

## 2021-10-18 HISTORY — DX: Personal history of irradiation: Z92.3

## 2021-10-28 ENCOUNTER — Other Ambulatory Visit: Payer: Self-pay | Admitting: Hematology and Oncology

## 2021-10-28 DIAGNOSIS — N6320 Unspecified lump in the left breast, unspecified quadrant: Secondary | ICD-10-CM

## 2021-10-29 ENCOUNTER — Ambulatory Visit
Admission: RE | Admit: 2021-10-29 | Discharge: 2021-10-29 | Disposition: A | Discharge status: Home / Self Care | Payer: Medicare HMO | Source: Ambulatory Visit | Attending: Oncology | Admitting: Oncology

## 2021-10-29 DIAGNOSIS — Z171 Estrogen receptor negative status [ER-]: Secondary | ICD-10-CM | POA: Diagnosis not present

## 2021-10-29 DIAGNOSIS — C50212 Malignant neoplasm of upper-inner quadrant of left female breast: Secondary | ICD-10-CM | POA: Diagnosis not present

## 2021-10-29 DIAGNOSIS — N6320 Unspecified lump in the left breast, unspecified quadrant: Secondary | ICD-10-CM

## 2021-10-29 DIAGNOSIS — N6322 Unspecified lump in the left breast, upper inner quadrant: Secondary | ICD-10-CM | POA: Diagnosis not present

## 2021-10-30 ENCOUNTER — Telehealth: Payer: Self-pay | Admitting: Hematology and Oncology

## 2021-10-30 NOTE — Telephone Encounter (Signed)
Scheduled appointment per 01/10 staff message. Left message with appointment times.

## 2021-11-05 ENCOUNTER — Other Ambulatory Visit: Payer: Self-pay | Admitting: General Surgery

## 2021-11-05 DIAGNOSIS — C50912 Malignant neoplasm of unspecified site of left female breast: Secondary | ICD-10-CM | POA: Diagnosis not present

## 2021-11-05 DIAGNOSIS — Z9189 Other specified personal risk factors, not elsewhere classified: Secondary | ICD-10-CM | POA: Insufficient documentation

## 2021-11-06 ENCOUNTER — Encounter: Payer: Self-pay | Admitting: Adult Health

## 2021-11-06 DIAGNOSIS — Z171 Estrogen receptor negative status [ER-]: Secondary | ICD-10-CM | POA: Insufficient documentation

## 2021-11-11 ENCOUNTER — Other Ambulatory Visit: Payer: Self-pay

## 2021-11-11 ENCOUNTER — Encounter: Payer: Self-pay | Admitting: Hematology and Oncology

## 2021-11-11 ENCOUNTER — Other Ambulatory Visit: Payer: Self-pay | Admitting: General Surgery

## 2021-11-11 ENCOUNTER — Inpatient Hospital Stay: Payer: Medicare HMO | Attending: Hematology and Oncology | Admitting: Hematology and Oncology

## 2021-11-11 VITALS — BP 149/65 | HR 58 | Temp 97.9°F | Resp 18 | Ht 64.5 in | Wt 196.6 lb

## 2021-11-11 DIAGNOSIS — C50412 Malignant neoplasm of upper-outer quadrant of left female breast: Secondary | ICD-10-CM | POA: Diagnosis not present

## 2021-11-11 DIAGNOSIS — I10 Essential (primary) hypertension: Secondary | ICD-10-CM | POA: Insufficient documentation

## 2021-11-11 DIAGNOSIS — Z171 Estrogen receptor negative status [ER-]: Secondary | ICD-10-CM | POA: Diagnosis not present

## 2021-11-11 DIAGNOSIS — C3411 Malignant neoplasm of upper lobe, right bronchus or lung: Secondary | ICD-10-CM | POA: Insufficient documentation

## 2021-11-11 DIAGNOSIS — Z90710 Acquired absence of both cervix and uterus: Secondary | ICD-10-CM | POA: Diagnosis not present

## 2021-11-11 DIAGNOSIS — C50212 Malignant neoplasm of upper-inner quadrant of left female breast: Secondary | ICD-10-CM | POA: Diagnosis not present

## 2021-11-11 NOTE — Progress Notes (Signed)
[name]  [phone] Fax:(336) [phone]     ID: Alisha [name] DOB: 11/06/1934  MR#: [id number]  RXV#:[id number]  Patient Care Team: [name], MD as PCP - General (Family Medicine) Eppie Gibson, MD as Attending Physician (Radiation Oncology) Leonie Man, MD as Consulting Physician (Cardiology) Collene Gobble, MD as Consulting Physician (Pulmonary Disease) Benay Pike, MD as Consulting Physician (Hematology and Oncology) Stark Klein, MD as Consulting Physician (General Surgery) Benay Pike, MD  CHIEF COMPLAINT: triple negative breast cancer; squamous cell lung cancer  CURRENT TREATMENT: Observation   INTERVAL HISTORY: Alisha [name] returns today for follow up of her triple negative breast cancer. She continues under observation.  Since her last visit, she received SBRT treatment under Dr. Isidore Moos in 01/2021 for her squamous cell lung cancer.  Follow-up chest CT scan 06/03/2021 showed:  1. Decreased size of right upper lobe pulmonary nodule consistent with response to therapy. There are associated radiation changes in the surrounding lung. 2. Multiple additional right-sided pulmonary nodules are unchanged and considered benign. No evidence of metastatic disease. 3. Mild coronary and Aortic Atherosclerosis (ICD10-I70.0).   She is now behind on her annual mammography, most recent being on 06/18/2020.  Quite aside from cancer issues, there has been a steady drop in her hemoglobin  Results for Alisha, [name] (MRN [id number]) as of 07/22/2021 11:02  Ref. Range 07/21/2019 15:25 02/13/2020 13:59 09/05/2020 14:51 12/20/2020 14:56 07/22/2021 10:07  Hemoglobin Latest Ref Range: 12.0 - 15.0 g/dL 12.7 12.3 11.7 (L) 11.1 (L) 10.8 (L)   With no change on her MCV.  Results for Alisha, [name] (MRN [id number]) as of 07/22/2021 11:02  Ref. Range 07/21/2019 15:25 02/13/2020 13:59 09/05/2020 14:51 12/20/2020 14:56 07/22/2021 10:07  MCV Latest Ref  Range: 80.0 - 100.0 fL 95.7 96.8 96.3 96.0 95.7   Her creatinine remains normal  Results for Alisha, [name] (MRN [id number]) as of 07/22/2021 11:02  Ref. Range 11/29/2020 11:00 12/13/2020 10:35 12/20/2020 14:56 01/24/2021 16:27 07/22/2021 10:07  Creatinine Latest Ref Range: 0.44 - 1.00 mg/dL 0.76 0.82 0.88 0.88 0.85   REVIEW OF SYSTEMS:    COVID 19 VACCINATION STATUS: infection 11/2019; Pfizer x4, most recently 03/2021   Oncology History  Alisha [name] was noted to have a left breast upper-outer quadrant distortion on screening mammogram in 2017. She proceeded to biopsy ([id number]), which showed a complex sclerosing lesion. She opted to undergo left lumpectomy ([id number]) on 08/11/2016 under Dr. Brantley Stage, which revealed a radial scar with calcifications.   She presented for routine screening mammogram, which showed a possible mass and adjacent calcifications in the left breast. She was referred to [facility] for [facility] imaging on 06/16/2019. Physical exam performed that day showed a palpable soft thickening in the upper central aspect of the left breast. She underwent left diagnostic mammography with tomography and ultrasonography showing: breast density category B; suspicious 7 mm mass in the 12 o'clock location of the left breast; small indeterminate group of calcifications 4 mm anterior to the mass warrant excision if the mass is positive for malignancy; left axilla negative for adenopathy.  Accordingly on 06/22/2019 she proceeded to biopsy of the left breast area in question. The pathology from this procedure ([id number]) showed: invasive ductal carcinoma, grade 3. Prognostic indicators significant for: estrogen receptor, 0% negative and progesterone receptor, 0% negative. Proliferation marker Ki67 at 30%. HER2 negative by immunohistochemistry (0).  (1) status post left lumpectomy and sentinel lymph node sampling 07/27/2019 for a pT1b pN0, stage IB invasive  ductal carcinoma, with  clear margins. (a) 2 left axillary lymph nodes were removed No adjuvant chemotherapy planned  Adjuvant radiation 09/07/2019 through 10/06/2019 Site Technique Total Dose (Gy) Dose per Fx (Gy) Completed Fx Beam Energies  Breast, Left: Breast_Lt 3D 40.05/40.05 2.67 15/15 6X, 10X  Breast, Left: Breast_Lt_Bst specialPort 10/10 2 5/5 15E    LUNG CANCER CT of the chest 11/14/2020 showed a new (as compared to July 2020) right upper lobe nodule measuring 0.9 cm, with no other findings of concern  (a) PET scan 12/07/2020 find the right upper lobe nodule in question to be hypermetabolic, with no other hypermetabolic areas elsewhere in the chest or abdomen  (b) CA 27-29 and CEA obtained 12/20/2020 were in the normal range  (c) bronchoscopic biopsy 12/27/2020 read as consistent with squamous cell carcinoma  Radiation to lung with curative intent 02/13/2021 through 02/20/2021 Site Technique Total Dose (Gy) Dose per Fx (Gy) Completed Fx Beam Energies  Lung, Right: Lung_Rt_RUL IMRT 54/54 18 3/3 6XFFF    10/18/2021  Left mammogram showed a 1.0 cm mass in the left breast at 10:00, no evidence of left axillary lymphadenopathy Biopsy from this mass showed invasive ductal carcinoma grade 2 of 3, prognostic markers show ER 0% negative, PR 0% negative, KI of 20% and HER2 negative. She had surgical consultation with Dr. Barry Dienes on January 17, surgical recommendations pending since prognostics were not available at that time.  Interval History  Patient is here for a follow up since last biopsy to discuss results. She is doing well, no complaints. She is very healthy for her age.  She is still very independent with all her activities, exercises regularly.  She has met with Dr. Barry Dienes last week but did not have an exact surgical plan since the prognostics were pending at this time.  Rest of the pertinent 10 point ROS reviewed and negative.  PAST MEDICAL HISTORY: Past Medical History:  Diagnosis Date   Arthritis     Asthma    Never smoker   Blood transfusion    over 74 y ears   Breast mass, left    History of radiation therapy 09/07/19- 10/06/19   Left Breast 15 fractions of 2.67 Gy to total 40.05 Gy. Left breast boost 5 fractions of 2 Gy each to total 10 Gy   Hypertension    on meds   Hypothyroidism    on meds   Personal history of radiation therapy    Pre-diabetes     PAST SURGICAL HISTORY: Past Surgical History:  Procedure Laterality Date   ABDOMINAL HYSTERECTOMY     32 yrs   BREAST LUMPECTOMY Left 08/11/2016   BREAST LUMPECTOMY Left 07/27/2019   BREAST LUMPECTOMY WITH RADIOACTIVE SEED AND SENTINEL LYMPH NODE BIOPSY Left 07/27/2019   Procedure: LEFT BREAST LUMPECTOMY WITH RADIOACTIVE SEED AND LEFT SENTINEL LYMPH NODE MAPPING;  Surgeon: Erroll Luna, MD;  Location: [place];  Service: General;  Laterality: Left;   BREAST LUMPECTOMY WITH RADIOACTIVE SEED LOCALIZATION Left 08/11/2016   Procedure: LEFT BREAST LUMPECTOMY WITH RADIOACTIVE SEED LOCALIZATION;  Surgeon: Erroll Luna, MD;  Location: [place];  Service: General;  Laterality: Left;   BRONCHIAL BIOPSY  12/27/2020   Procedure: BRONCHIAL BIOPSIES;  Surgeon: Collene Gobble, MD;  Location: [facility] ENDOSCOPY;  Service: Pulmonary;;   BRONCHIAL BRUSHINGS  12/27/2020   Procedure: BRONCHIAL BRUSHINGS;  Surgeon: Collene Gobble, MD;  Location: [facility] ENDOSCOPY;  Service: Pulmonary;;   BRONCHIAL NEEDLE ASPIRATION BIOPSY  12/27/2020   Procedure: BRONCHIAL NEEDLE  ASPIRATION BIOPSIES;  Surgeon: Collene Gobble, MD;  Location: [facility] ENDOSCOPY;  Service: Pulmonary;;   BRONCHIAL WASHINGS  12/27/2020   Procedure: BRONCHIAL WASHINGS;  Surgeon: Collene Gobble, MD;  Location: [facility] ENDOSCOPY;  Service: Pulmonary;;   CATARACT EXTRACTION, BILATERAL     CORONARY CALCIUM SCORE-CT ANGIOGRAM  04/2019   Small, < 44mm nodule noted.  Difficult study to read.  Coronary calcium score was 11.  Significant motion artifact, but visible territory showed minimal  disease.  Minimal calcium noted in the ostial LAD but no suggestion of significant CAD.   EYE SURGERY     FIDUCIAL MARKER PLACEMENT  12/27/2020   Procedure: FIDUCIAL MARKER PLACEMENT;  Surgeon: Collene Gobble, MD;  Location: [facility] ENDOSCOPY;  Service: Pulmonary;;   JOINT REPLACEMENT Bilateral    left knee 2012   KNEE ARTHROPLASTY  02/17/2012   Procedure: COMPUTER ASSISTED TOTAL KNEE ARTHROPLASTY;  Surgeon: Meredith Pel, MD;  Location: [place];  Service: Orthopedics;  Laterality: Right;  Right total knee replacement   TRANSTHORACIC ECHOCARDIOGRAM  04/02/2020   Normal LV function.  No R WMA.  Moderate LVH.  GRII DD with elevated filling pressures.  Moderate LA dilation with trivial MR.  Mild aortic valve sclerosis.   VIDEO BRONCHOSCOPY WITH ENDOBRONCHIAL NAVIGATION N/A 12/27/2020   Procedure: VIDEO BRONCHOSCOPY WITH ENDOBRONCHIAL NAVIGATION;  Surgeon: Collene Gobble, MD;  Location: [place] ENDOSCOPY;  Service: Pulmonary;  Laterality: N/A;    FAMILY HISTORY: Family History  Problem Relation Age of Onset   Other Mother        died @ 77 - but was healthy   Hypertension Sister    Diabetes Sister    Diabetes Brother        no longer on medications.    Anesthesia problems Neg Hx    Hypotension Neg Hx    Malignant hyperthermia Neg Hx    Pseudochol deficiency Neg Hx   Patient's father was in his mid 23s years old when he died from unknown causes. Patient's mother died from "natural causes" at age 73. The patient denies a family hx of breast or ovarian cancer. She has 5 brothers and 1 sister.   GYNECOLOGIC HISTORY:  No LMP recorded. Patient is postmenopausal. 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  She is now retired.   She describes herself a single.  Her daughter Marisa Hua lives in [place].  She has multiple medical problems.  The patient's son died at age 21.  The patient has 1 grandchild, Johnny Bridge, who is buying a home 5 minutes from the patient t and works for [employer].  He has 4 children, 2 have gone through college, one is "in between" and the other 1 is currently in college.  The patient herself attends a local [place] DIRECTIVES: The patient intends to name her grandson Johnny Bridge as her healthcare power of attorney.  He can be reached at [phone].  She was given the appropriate documents to complete and notarize at her discretion at the time of her 07/21/2019 visit   HEALTH MAINTENANCE: Social History   Tobacco Use   Smoking status: Never   Smokeless tobacco: Never  Vaping Use   Vaping  Use: Never used  Substance Use Topics   Alcohol use: No   Drug use: No     Allergies  Allergen Reactions   Doxepin Hcl     Other reaction(s): Body swelling   Estradiol     Other reaction(s): itching   Gabapentin     Other reaction(s): swelling   Levofloxacin     Other reaction(s): Unknown   Lisinopril Itching    No energy Other reaction(s): itching, hair loss   Nebivolol Hcl     Other reaction(s): fatigue   Norvasc [Amlodipine]     Other reaction(s): swelling, ha, itching   Olmesartan     Other reaction(s): hair loss, nail changes   Tramadol Hcl     Other reaction(s): abdominal pain   Penicillins Itching and Rash    Has patient had a PCN reaction causing immediate rash, facial/tongue/throat swelling, SOB or lightheadedness with hypotension: No Has patient had a PCN reaction causing severe rash involving mucus membranes or skin necrosis: No Has patient had a PCN reaction that required hospitalization: No Has patient had a PCN reaction occurring within the last 10 years: No If all of the above answers are "NO", then may proceed with  Cephalosporin use.   Sulfa Antibiotics Itching and Rash    Current Outpatient Medications  Medication Sig Dispense Refill   acetaminophen (TYLENOL) 500 MG tablet Take 1,000 mg by mouth every 6 (six) hours as needed (pain).     albuterol (PROVENTIL HFA;VENTOLIN HFA) 108 (90 Base) MCG/ACT inhaler Inhale 1-2 puffs into the lungs every 6 (six) hours as needed for wheezing or shortness of breath.      amLODipine (NORVASC) 10 MG tablet Take 1 tablet (10 mg total) by mouth daily. 90 tablet 3   Artificial Tear Solution (SOOTHE XP OP) Place 1 drop into both eyes in the morning and at bedtime.     Biotin 5 MG TABS Take 5 mg by mouth daily at 12 noon.     cetaphil (CETAPHIL) cream Apply 1 application topically as needed (dry skin).     chlorthalidone (HYGROTON) 25 MG tablet TAKE ONE TABLET BY MOUTH AT LUNCH 90 tablet 1   COVID-19 mRNA vaccine, Moderna, 100 MCG/0.5ML injection Inject into the muscle. 0.5 mL 0   diclofenac Sodium (VOLTAREN) 1 % GEL See admin instructions.     diphenhydrAMINE HCl 50 MG/30ML LIQD Take 50 mg by mouth at bedtime as needed (sleep).     fluticasone (FLONASE) 50 MCG/ACT nasal spray Place 2 sprays into both nostrils daily.     furosemide (LASIX) 20 MG tablet Take 20 mg by mouth daily as needed for fluid or edema.     gabapentin (NEURONTIN) 100 MG capsule Take 1 capsule (100 mg total) by mouth at bedtime.     levothyroxine (SYNTHROID, LEVOTHROID) 88 MCG tablet Take 88 mcg by mouth daily before breakfast.      losartan (COZAAR) 25 MG tablet Take 25 mg by mouth daily.     meclizine (ANTIVERT) 25 MG tablet Take 12.5 mg by mouth 3 (three) times daily as needed for dizziness.     Multiple Vitamins-Minerals (ICAPS AREDS 2 PO) Take 1 capsule by mouth 2 (two) times daily.      OVER THE COUNTER MEDICATION Take 300 mg by mouth daily at 12 noon. ADRENAL SUPPORT WITH ASHWAGANDHA     potassium chloride SA (KLOR-CON) 20 MEQ tablet Take 20 mEq by mouth daily as needed (when taking furosemide).      valsartan (DIOVAN)  320 MG tablet Take 1 tablet (320 mg total) by mouth daily. 90 tablet 3   No current facility-administered medications for this visit.    OBJECTIVE: African-American woman who appears stated age  There were no vitals filed for this visit.    There is no height or weight on file to calculate BMI.   Wt Readings from Last 3 Encounters:  09/10/21 201 lb (91.2 kg)  07/22/21 206 lb 2 oz (93.5 kg)  06/04/21 209 lb 6 oz (95 kg)      ECOG FS:2 - Symptomatic, <50% confined to bed  Sclerae unicteric, EOMs intact Wearing a mask No cervical or supraclavicular adenopathy Lungs no rales or rhonchi Heart regular rate and rhythm Abd soft, nontender, positive bowel sounds MSK no focal spinal tenderness, had some difficulty getting onto the examination table Neuro: nonfocal, well oriented, appropriate affect Breasts: Palpable left breast mass at the surgical site measuring around 1 and 1-1/2 cm in largest dimension, no palpable regional adenopathy,   LAB RESULTS:  CMP     Component Value Date/Time   NA 142 07/22/2021 1007   NA 141 01/24/2021 1627   K 3.6 07/22/2021 1007   CL 106 07/22/2021 1007   CO2 24 07/22/2021 1007   GLUCOSE 173 (H) 07/22/2021 1007   BUN 19 07/22/2021 1007   BUN 25 01/24/2021 1627   CREATININE 0.85 07/22/2021 1007   CREATININE 0.78 07/21/2019 1525   CALCIUM 9.4 07/22/2021 1007   PROT 6.6 07/22/2021 1007   ALBUMIN 4.0 07/22/2021 1007   AST 16 07/22/2021 1007   AST 15 07/21/2019 1525   ALT 12 07/22/2021 1007   ALT 19 07/21/2019 1525   ALKPHOS 70 07/22/2021 1007   BILITOT 0.4 07/22/2021 1007   BILITOT 0.2 (L) 07/21/2019 1525   GFRNONAA >60 07/22/2021 1007   GFRNONAA >60 07/21/2019 1525   GFRAA 76 12/13/2020 1035   GFRAA >60 07/21/2019 1525    No results found for: TOTALPROTELP, ALBUMINELP, A1GS, A2GS, BETS, BETA2SER, GAMS, MSPIKE, SPEI  No results found for: KPAFRELGTCHN, LAMBDASER, KAPLAMBRATIO  Lab Results  Component Value Date    WBC 9.3 07/22/2021   NEUTROABS 5.8 07/22/2021   HGB 10.8 (L) 07/22/2021   HCT 33.7 (L) 07/22/2021   MCV 95.7 07/22/2021   PLT 331 07/22/2021   No results found for: LABCA2  No components found for: OBSJGG836  No results for input(s): INR in the last 168 hours.  No results found for: LABCA2  No results found for: OQH476  No results found for: CAN125  No results found for: LYY503  Lab Results  Component Value Date   CA2729 15.8 12/20/2020    No components found for: HGQUANT  Lab Results  Component Value Date   CEA1 1.24 12/20/2020   /  CEA ([facility])  Date Value Ref Range Status  12/20/2020 1.24 0.00 - 5.00 ng/mL Final    Comment:    (NOTE) This test was performed using Architect's Chemiluminescent Microparticle Immunoassay. Values obtained from different assay methods cannot be used interchangeably. Please note that 5-10% of patients who smoke may see CEA levels up to 6.9 ng/mL. Performed at [facility], [place] [address], [place],  [address]      No results found for: AFPTUMOR  No results found for: CHROMOGRNA  No results found for: HGBA, HGBA2QUANT, HGBFQUANT, HGBSQUAN (Hemoglobinopathy evaluation)   No results found for: LDH  No results found for: IRON, TIBC, IRONPCTSAT (Iron and TIBC)  No results found  for: FERRITIN  Urinalysis    Component Value Date/Time   COLORURINE YELLOW 07/02/2018 1752   APPEARANCEUR CLEAR 07/02/2018 1752   LABSPEC 1.021 07/02/2018 1752   PHURINE 5.0 07/02/2018 1752   GLUCOSEU NEGATIVE 07/02/2018 1752   HGBUR NEGATIVE 07/02/2018 1752   BILIRUBINUR NEGATIVE 07/02/2018 1752   KETONESUR 20 (A) 07/02/2018 1752   PROTEINUR NEGATIVE 07/02/2018 1752   UROBILINOGEN 1.0 02/20/2012 0927   NITRITE NEGATIVE 07/02/2018 1752   LEUKOCYTESUR NEGATIVE 07/02/2018 1752    STUDIES: US BREAST LTD UNI LEFT INC AXILLA  Result Date: 10/18/2021 CLINICAL DATA:  87 year old female with history of left  breast lumpectomy x 2, most recently in 2020 presenting for screening recall of a left breast mass with distortion. EXAM: DIGITAL DIAGNOSTIC UNILATERAL LEFT MAMMOGRAM WITH TOMOSYNTHESIS AND CAD; ULTRASOUND LEFT BREAST LIMITED TECHNIQUE: Left digital diagnostic mammography and breast tomosynthesis was performed. The images were evaluated with computer-aided detection.; Targeted ultrasound examination of the left breast was performed. COMPARISON:  Previous exam(s). ACR Breast Density Category b: There are scattered areas of fibroglandular density. FINDINGS: Diagnostic images through the medial middle depth of the left breast demonstrates an irregular low-density mass with a few associated calcifications and distortion. The mass is located just anterior to the patient's lumpectomy site. Ultrasound targeted to the left breast at 10 o'clock, 5 cm from the nipple demonstrates an irregular ill-defined hypoechoic mass measuring 1.2 x 0.7 x 0.7 cm. No abnormal lymph nodes are found in the left axilla. IMPRESSION: 1.  There is a 1.2 cm mass in the left breast at 10 o'clock. 2.  No evidence of left axillary lymphadenopathy. RECOMMENDATION: Ultrasound guided biopsy is recommended for the left breast mass. This has been scheduled for 10/29/2021 at 7:30 a.m. I have discussed the findings and recommendations with the patient. If applicable, a reminder letter will be sent to the patient regarding the next appointment. BI-RADS CATEGORY  4: Suspicious. Electronically Signed   By: Ammie Ferrier M.D.   On: 10/18/2021 11:36  MM DIAG BREAST TOMO UNI LEFT  Result Date: 10/18/2021 CLINICAL DATA:  87 year old female with history of left breast lumpectomy x 2, most recently in 2020 presenting for screening recall of a left breast mass with distortion. EXAM: DIGITAL DIAGNOSTIC UNILATERAL LEFT MAMMOGRAM WITH TOMOSYNTHESIS AND CAD; ULTRASOUND LEFT BREAST LIMITED TECHNIQUE: Left digital diagnostic mammography and breast tomosynthesis  was performed. The images were evaluated with computer-aided detection.; Targeted ultrasound examination of the left breast was performed. COMPARISON:  Previous exam(s). ACR Breast Density Category b: There are scattered areas of fibroglandular density. FINDINGS: Diagnostic images through the medial middle depth of the left breast demonstrates an irregular low-density mass with a few associated calcifications and distortion. The mass is located just anterior to the patient's lumpectomy site. Ultrasound targeted to the left breast at 10 o'clock, 5 cm from the nipple demonstrates an irregular ill-defined hypoechoic mass measuring 1.2 x 0.7 x 0.7 cm. No abnormal lymph nodes are found in the left axilla. IMPRESSION: 1.  There is a 1.2 cm mass in the left breast at 10 o'clock. 2.  No evidence of left axillary lymphadenopathy. RECOMMENDATION: Ultrasound guided biopsy is recommended for the left breast mass. This has been scheduled for 10/29/2021 at 7:30 a.m. I have discussed the findings and recommendations with the patient. If applicable, a reminder letter will be sent to the patient regarding the next appointment. BI-RADS CATEGORY  4: Suspicious. Electronically Signed   By: Ammie Ferrier M.D.   On: 10/18/2021 11:36  MM CLIP PLACEMENT LEFT  Result Date: 10/29/2021 CLINICAL DATA:  Status post ultrasound-guided biopsy of the left breast. EXAM: 3D DIAGNOSTIC LEFT MAMMOGRAM POST ULTRASOUND BIOPSY COMPARISON:  Previous exam(s). FINDINGS: 3D Mammographic images were obtained following ultrasound guided biopsy of the left breast. The biopsy marking clip is in expected position at the site of biopsy. IMPRESSION: Appropriate positioning of the ribbon shaped biopsy marking clip at the site of biopsy in the upper inner left breast. Final Assessment: Post Procedure Mammograms for Marker Placement Electronically Signed   By: Kristopher Oppenheim M.D.   On: 10/29/2021 08:32  [place] LT BREAST BX W LOC DEV 1ST LESION IMG BX SPEC US  GUIDE  Addendum Date: 11/01/2021   ADDENDUM REPORT: 11/01/2021 08:00 ADDENDUM: Pathology revealed GRADE II INVASIVE DUCTAL CARCINOMA of the LEFT breast, 10 o'clock, 5cmfn (ribbon clip). This was found to be concordant by Dr. Kristopher Oppenheim. Pathology results were discussed with the patient by telephone. The patient reported doing well after the biopsy with tenderness at the site. Post biopsy instructions and care were reviewed and questions were answered. The patient was encouraged to call [facility] for any additional concerns. My direct phone number was provided. Surgical consultation has been arranged with Dr. Stark Klein at [facility] on November 05, 2021. Medical oncology consultation has been arranged with Dr. Benay Pike at [facility] on November 11, 2021. Dr. Chryl Heck was notified of biopsy results via EPIC message on October 31, 2021. Pathology results reported by Terie Purser, RN on 10/31/2021. Electronically Signed   By: Kristopher Oppenheim M.D.   On: 11/01/2021 08:00   Result Date: 11/01/2021 CLINICAL DATA:  87 year old female with a suspicious left breast mass. EXAM: ULTRASOUND GUIDED LEFT BREAST CORE NEEDLE BIOPSY COMPARISON:  Previous exam(s). PROCEDURE: I met with the patient and we discussed the procedure of ultrasound-guided biopsy, including benefits and alternatives. We discussed the high likelihood of a successful procedure. We discussed the risks of the procedure, including infection, bleeding, tissue injury, clip migration, and inadequate sampling. Informed written consent was given. The usual time-out protocol was performed immediately prior to the procedure. Lesion quadrant: Upper inner quadrant Using sterile technique and 1% Lidocaine as local anesthetic, under direct ultrasound visualization, a 14 gauge spring-loaded device was used to perform biopsy of a mass at the 10 o'clock position using a inferior approach. At the conclusion of  the procedure a ribbon shaped tissue marker clip was deployed into the biopsy cavity. Follow up 2 view mammogram was performed and dictated separately. IMPRESSION: Ultrasound guided biopsy of the left breast. No apparent complications. Electronically Signed: By: Kristopher Oppenheim M.D. On: 10/29/2021 08:19    ELIGIBLE FOR AVAILABLE RESEARCH PROTOCOL: no  ASSESSMENT: 87 y.o. [employer], [place] with recently diagnosed left breast upper outer quadrant invasive ductal carcinoma, triple negative who is here for follow-up.  Please refer to oncological history for complete history.  Her last breast cancer back in 2020 was in the left breast at 12 o'clock position again IDC triple negative.  At that time she had surgery, adjuvant radiation, no adjuvant chemotherapy.  Since then she also had lung cancer which was treated with SBRT with excellent response.  PLAN:  She is a very robust 87 year old with likely second triple negative invasive ductal carcinoma in the same breast versus recurrent breast cancer.  Given very small left breast mass, we have not discussed about imaging scans although this certainly can be entertained if rest of  the team feels it is necessary.  She has excellent performance status and had followed up with Dr. Barry Dienes for surgical recommendations.  Since she may not be a candidate for repeat radiation, if she can tolerate mastectomy, I have recommended that she consider left mastectomy.  Again since she is 40, we have discussed chemotherapy but if the tumor continues to be small on final surgical specimen, we may elect observation. She is agreeable to all these recommendations, she is hoping to proceed with mastectomy as well.  She was very worried about some bleeding issues that happened during her last surgery.  We have briefly discussed about this and she can also have another conversation with Dr. Barry Dienes about this.  She will return to clinic in about 4 weeks to review final surgical pathology  and to discuss any additional recommendations.  No role for antiestrogen therapy.  Total encounter time 40 minutes.   Benay Pike, MD   11/11/2021 9:18 AM Medical Oncology and Hematology [facility] [place], [place] [address] Tel. [phone]    Fax. [phone]  *Total Encounter Time as defined by the Centers for Medicare and Medicaid Services includes, in addition to the face-to-face time of a patient visit (documented in the note above) non-face-to-face time: obtaining and reviewing outside history, ordering and reviewing medications, tests or procedures, care coordination (communications with other health care professionals or caregivers) and documentation in the medical record.

## 2021-11-12 ENCOUNTER — Encounter: Payer: Self-pay | Admitting: *Deleted

## 2021-11-14 DIAGNOSIS — E039 Hypothyroidism, unspecified: Secondary | ICD-10-CM | POA: Diagnosis not present

## 2021-11-21 NOTE — Progress Notes (Signed)
Surgical Instructions    Your procedure is scheduled on 11/26/21.  Report to [facility] Main Entrance "A" at 9:30 A.M., then check in with the Admitting office.  Call this number if you have problems the morning of surgery:  [phone]   If you have any questions prior to your surgery date call [phone]: Open Monday-Friday 8am-4pm    Remember:  Do not eat after midnight the night before your surgery  You may drink clear liquids until 8:30 the morning of your surgery.   Clear liquids allowed are: Water, Non-Citrus Juices (without pulp), Carbonated Beverages, Clear Tea, Black Coffee ONLY (NO MILK, CREAM OR POWDERED CREAMER of any kind), and Gatorade  Please complete your PRE-SURGERY GATORADE that was provided to you by 8:30 the morning of surgery.  Please, if able, drink it in one setting. DO NOT SIP.     Take these medicines the morning of surgery with A SIP OF WATER:  amLODipine (NORVASC) levothyroxine (SYNTHROID, LEVOTHROID)  AS NEEDED: acetaminophen (TYLENOL)  albuterol (PROVENTIL HFA;VENTOLIN HFA) - bring day of surgery Artificial Tear  fluticasone (FLONASE) meclizine (ANTIVERT)   As of today, STOP taking any Aspirin (unless otherwise instructed by your surgeon) Aleve, Naproxen, Ibuprofen, Motrin, Advil, Goody's, BC's, all herbal medications, fish oil, and all vitamins.  After your COVID test   You are not required to quarantine however you are required to wear a well-fitting mask when you are out and around people not in your household.  If your mask becomes wet or soiled, replace with a new one.  Wash your hands often with soap and water for 20 seconds or clean your hands with an alcohol-based hand sanitizer that contains at least 60% alcohol.  Do not share personal items.  Notify your provider: if you are in close contact with someone who has COVID  or if you develop a fever of 100.4 or greater, sneezing, cough, sore throat, shortness of breath or body aches.            Do not wear jewelry or makeup Do not wear lotions, powders, perfumes or deodorant. Do not shave 48 hours prior to surgery.   Do not bring valuables to the hospital. Do not wear nail polish, gel polish, artificial nails, or any other type of covering on natural nails (fingers and toes) If you have artificial nails or gel coating that need to be removed by a nail salon, please have this removed prior to surgery. Artificial nails or gel coating may interfere with anesthesia's ability to adequately monitor your vital signs.             [facility] is not responsible for any belongings or valuables.  Do NOT Smoke (Tobacco/Vaping)  24 hours prior to your procedure  If you use a CPAP at night, you may bring your mask for your overnight stay.   Contacts, glasses, hearing aids, dentures or partials may not be worn into surgery, please bring cases for these belongings   For patients admitted to the hospital, discharge time will be determined by your treatment team.   Patients discharged the day of surgery will not be allowed to drive home, and someone needs to stay with them for 24 hours.  NO VISITORS WILL BE ALLOWED IN PRE-OP WHERE PATIENTS ARE PREPPED FOR SURGERY.  ONLY 1 SUPPORT PERSON MAY BE PRESENT IN THE WAITING ROOM WHILE YOU ARE IN SURGERY.  IF YOU ARE TO BE ADMITTED, ONCE YOU ARE IN YOUR ROOM YOU WILL BE ALLOWED  TWO (2) VISITORS. 1 (ONE) VISITOR MAY STAY OVERNIGHT BUT MUST ARRIVE TO THE ROOM BY 8pm.  Minor children may have two parents present. Special consideration for safety and communication needs will be reviewed on a case by case basis.  Special instructions:    Oral Hygiene is also important to reduce your risk of infection.  Remember - BRUSH YOUR TEETH THE MORNING OF SURGERY WITH YOUR REGULAR TOOTHPASTE   [facility]- Preparing For Surgery  Before surgery, you can play an important role. Because skin is not sterile, your skin needs to be as free of germs as possible. You can  reduce the number of germs on your skin by washing with CHG (chlorahexidine gluconate) Soap before surgery.  CHG is an antiseptic cleaner which kills germs and bonds with the skin to continue killing germs even after washing.     Please do not use if you have an allergy to CHG or antibacterial soaps. If your skin becomes reddened/irritated stop using the CHG.  Do not shave (including legs and underarms) for at least 48 hours prior to first CHG shower. It is OK to shave your face.  Please follow these instructions carefully.     Shower the NIGHT BEFORE SURGERY and the MORNING OF SURGERY with CHG Soap.   If you chose to wash your hair, wash your hair first as usual with your normal shampoo. After you shampoo, rinse your hair and body thoroughly to remove the shampoo.  Then [employer] and genitals (private parts) with your normal soap and rinse thoroughly to remove soap.  After that Use CHG Soap as you would any other liquid soap. You can apply CHG directly to the skin and wash gently with a scrungie or a clean washcloth.   Apply the CHG Soap to your body ONLY FROM THE NECK DOWN.  Do not use on open wounds or open sores. Avoid contact with your eyes, ears, mouth and genitals (private parts). Wash Face and genitals (private parts)  with your normal soap.   Wash thoroughly, paying special attention to the area where your surgery will be performed.  Thoroughly rinse your body with warm water from the neck down.  DO NOT shower/wash with your normal soap after using and rinsing off the CHG Soap.  Pat yourself dry with a CLEAN TOWEL.  Wear CLEAN PAJAMAS to bed the night before surgery  Place CLEAN SHEETS on your bed the night before your surgery  DO NOT SLEEP WITH PETS.   Day of Surgery:  Take a shower with CHG soap. Wear Clean/Comfortable clothing the morning of surgery Do not apply any deodorants/lotions.   Remember to brush your teeth WITH YOUR REGULAR TOOTHPASTE.   Please read over  the following fact sheets that you were given.

## 2021-11-22 ENCOUNTER — Encounter (HOSPITAL_COMMUNITY): Payer: Self-pay

## 2021-11-22 ENCOUNTER — Other Ambulatory Visit: Payer: Self-pay

## 2021-11-22 ENCOUNTER — Encounter (HOSPITAL_COMMUNITY)
Admission: RE | Admit: 2021-11-22 | Discharge: 2021-11-22 | Disposition: A | Discharge status: Home / Self Care | Payer: Medicare HMO | Source: Ambulatory Visit | Attending: General Surgery | Admitting: General Surgery

## 2021-11-22 VITALS — BP 154/59 | HR 58 | Temp 98.7°F | Resp 18 | Ht 64.5 in | Wt 197.8 lb

## 2021-11-22 DIAGNOSIS — Z20822 Contact with and (suspected) exposure to covid-19: Secondary | ICD-10-CM | POA: Diagnosis not present

## 2021-11-22 DIAGNOSIS — R7303 Prediabetes: Secondary | ICD-10-CM | POA: Diagnosis not present

## 2021-11-22 DIAGNOSIS — Z01818 Encounter for other preprocedural examination: Secondary | ICD-10-CM | POA: Insufficient documentation

## 2021-11-22 LAB — CBC
HCT: 35 % — ABNORMAL LOW (ref 36.0–46.0)
Hemoglobin: 11.1 g/dL — ABNORMAL LOW (ref 12.0–15.0)
MCH: 31 pg (ref 26.0–34.0)
MCHC: 31.7 g/dL (ref 30.0–36.0)
MCV: 97.8 fL (ref 80.0–100.0)
Platelets: 330 10*3/uL (ref 150–400)
RBC: 3.58 MIL/uL — ABNORMAL LOW (ref 3.87–5.11)
RDW: 13 % (ref 11.5–15.5)
WBC: 9.7 10*3/uL (ref 4.0–10.5)
nRBC: 0 % (ref 0.0–0.2)

## 2021-11-22 LAB — BASIC METABOLIC PANEL
Anion gap: 12 (ref 5–15)
BUN: 15 mg/dL (ref 8–23)
CO2: 24 mmol/L (ref 22–32)
Calcium: 9.2 mg/dL (ref 8.9–10.3)
Chloride: 105 mmol/L (ref 98–111)
Creatinine, Ser: 0.77 mg/dL (ref 0.44–1.00)
GFR, Estimated: >60 mL/min (ref >60)
Glucose, Bld: 159 mg/dL — ABNORMAL HIGH (ref 70–99)
Potassium: 3.4 mmol/L — ABNORMAL LOW (ref 3.5–5.1)
Sodium: 141 mmol/L (ref 135–145)

## 2021-11-22 LAB — HEMOGLOBIN A1C
Hgb A1c MFr Bld: 5.3 % (ref 4.8–5.6)
Mean Plasma Glucose: 105.41 mg/dL

## 2021-11-22 LAB — SARS CORONAVIRUS 2 (TAT 6-24 HRS): SARS Coronavirus 2: NEGATIVE

## 2021-11-22 LAB — GLUCOSE, CAPILLARY: Glucose-Capillary: 139 mg/dL — ABNORMAL HIGH (ref 70–99)

## 2021-11-22 NOTE — Progress Notes (Signed)
PCP: Aliene Beams, MD Cardiologist: Glenetta Hew, MD  EKG: 11/22/21 CXR: 12/27/20 ECHO: 04/02/20 Stress Test: denies Cardiac Cath: denies  Fasting Blood Sugar- Pre-diabetes.  Does not check glucose.  Checks Blood Sugar__0_ times a day  ASA/Blood Thinner: No  OSA/CPAP: No  Covid test 11/22/21 at PAT  Anesthesia Review: No  Patient denies shortness of breath, fever, cough, and chest pain at PAT appointment.  Patient verbalized understanding of instructions provided today at the PAT appointment.  Patient asked to review instructions at home and day of surgery.

## 2021-11-25 NOTE — H&P (Signed)
REFERRING PHYSICIAN:  Kristopher Oppenheim, MD   PROVIDER:  Georgianne Fick, MD   Care Team: Patient Care Team: Arnetha Courser, MD as PCP - General (Family Medicine) Iruku, Flo Shanks, MD (Hematology and Oncology) Acquanetta Belling, MD (Radiation Oncology) Georgianne Fick, MD as Consulting Provider (Surgical Oncology)    MRN: [id number] DOB: 05/10/1935 DATE OF ENCOUNTER: 11/05/2021   Subjective    Chief Complaint: Breast Cancer       History of Present Illness: Alisha [name] is a 87 y.o. female who is seen today as an office consultation at the request of Dr. Jeanmarie Plant for evaluation of Breast Cancer   Pt was first diagnosed with Stage IB left breast invasive ductal carcinoma, ER-/PR-/HER2-,  in 06/2019, treated with lumpectomy and sentinel node biopsy followed by adjuvant radiation therapy.  This was Stage IB. She did not receive chemo due to age. Margins and nodes were negative. She has also had right lung cancer (squamous cell) treated with stereotactic radiation.  Interestingly, she had a similar location with an abnormality with a seed loc biopsy in 2017 that was benign.     On her mammogram in 09/2021, she was found to have a 1.2 cm mass at 10 o'clock, just anterior to the lumpectomy cavity.  This was biopsied and found to be grade 2 invasive ductal carcinoma.  Receptors are not back yet.  She is understandably upset.  She has no family members with cancer.     She had post op bleeding at her last surgery and is very scared that this will happen again.     Diagnostic mammogram:10/18/21 BCG ACR Breast Density Category b: There are scattered areas of fibroglandular density.   FINDINGS: Diagnostic images through the medial middle depth of the left breast demonstrates an irregular low-density mass with a few associated calcifications and distortion. The mass is located just anterior to the patient's lumpectomy site.   Ultrasound targeted to the left  breast at 10 o'clock, 5 cm from the nipple demonstrates an irregular ill-defined hypoechoic mass measuring 1.2 x 0.7 x 0.7 cm. No abnormal lymph nodes are found in the left axilla.   IMPRESSION: 1.  There is a 1.2 cm mass in the left breast at 10 o'clock.   2.  No evidence of left axillary lymphadenopathy.   RECOMMENDATION: Ultrasound guided biopsy is recommended for the left breast mass. This has been scheduled for 10/29/2021 at 7:30 a.m.   I have discussed the findings and recommendations with the patient. If applicable, a reminder letter will be sent to the patient regarding the next appointment.   BI-RADS CATEGORY  4: Suspicious.   Pathology core needle biopsy: 10/29/20 Breast, left, needle core biopsy, 10 o'clock, 5cmfn (ribbon clip) - INVASIVE DUCTAL CARCINOMA   Receptors: pending     Review of Systems: A complete review of systems was obtained from the patient.  I have reviewed this information and discussed as appropriate with the patient.  See HPI as well for other ROS.   Review of Systems  All other systems reviewed and are negative.       Medical History: Past Medical History      Past Medical History:  Diagnosis Date   Arthritis     Asthma, unspecified asthma severity, unspecified whether complicated, unspecified whether persistent     Glaucoma (increased eye pressure)     History of cancer     Thyroid disease  Patient Active Problem List  Diagnosis   Recurrent breast cancer, left (CMS-HCC)   At risk for hemorrhage associated with surgery      Past Surgical History       Past Surgical History:  Procedure Laterality Date   AJCC BREAST CANCER STAGE I: T1MIC, T1A OR T1B (TUMOR SIZE < 1 CM) DOCUMENTED (ONC)            Allergies       Allergies  Allergen Reactions   Amlodipine Other (See Comments)      Other reaction(s): swelling, ha, itching     Doxepin Hcl Other (See Comments)      Other reaction(s): Body swelling      Estradiol Other (See Comments)      Other reaction(s): itching     Gabapentin Other (See Comments)      Other reaction(s): swelling     Levofloxacin Other (See Comments)      Other reaction(s): Unknown   Lisinopril Itching and Other (See Comments)      No energy Other reaction(s): itching, hair loss     Nebivolol Other (See Comments)      Other reaction(s): fatigue     Olmesartan Other (See Comments)      Other reaction(s): hair loss, nail changes     Sulfa (Sulfonamide Antibiotics) Rash and Other (See Comments)   Tramadol Hcl Other (See Comments)      Other reaction(s): abdominal pain     Penicillins Other (See Comments), Itching and Rash      Has patient had a PCN reaction causing immediate rash, facial/tongue/throat swelling, SOB or lightheadedness with hypotension: No Has patient had a PCN reaction causing severe rash involving mucus membranes or skin necrosis: No Has patient had a PCN reaction that required hospitalization: No Has patient had a PCN reaction occurring within the last 10 years: No If all of the above answers are "NO", then may proceed with Cephalosporin use.                Current Outpatient Medications on File Prior to Visit  Medication Sig Dispense Refill   chlorthalidone 25 MG tablet 1 tablet       gabapentin (NEURONTIN) 100 MG capsule 1 capsule       valsartan (DIOVAN) 320 MG tablet 1 tablet       albuterol 90 mcg/actuation inhaler 2 puffs as needed       amLODIPine (NORVASC) 10 MG tablet TAKE ONE TABLET BY MOUTH AT NOON       atenoloL (TENORMIN) 25 MG tablet atenolol 25 mg tablet       biotin 5 mg capsule 1 capsule       cholecalciferol (VITAMIN D3) 5,000 unit capsule 1 capsule       diclofenac (VOLTAREN ARTHRITIS PAIN) 1 % topical gel as directed       diltiazem (CARDIZEM) 120 MG tablet diltiazem 120 mg tablet       doxepin (SINEQUAN) 25 MG capsule doxepin 25 mg capsule       fluticasone propionate (FLONASE) 50 mcg/actuation nasal spray 2 sprays  in each nostril       FUROsemide (LASIX) 20 MG tablet 1 tablet       hydroCHLOROthiazide (HYDRODIURIL) 25 MG tablet hydrochlorothiazide 25 mg tablet       indapamide (LOZOL) 1.25 MG tablet indapamide 1.25 mg tablet       levothyroxine (SYNTHROID) 88 MCG tablet TAKE 1 TABLET BY MOUTH EVERY DAY IN THE MORNING ON  AN EMPTY STOMACH       light mineral oiL-mineral oiL (SOOTHE XP) 1-4.5 % Drop Soothe XP 1 %-4.5 % eye drops       losartan (COZAAR) 25 MG tablet losartan 25 mg tablet       meclizine (ANTIVERT) 25 MG tablet 1/2 tablet-1 tablet       meperidine (DEMEROL) 50 mg tablet meperidine 50 mg tablet  take 1 tablet by mouth every 4 to 6 hours if needed for pain       peg-electrolyte (GOLYTELY) solution peg 3350-electrolytes 236 gram-22.74 gram-6.74 gram-5.86 gram solution  USE AS DIRECTED       potassium chloride (K-TAB) 20 mEq TbER ER tablet potassium chloride ER 20 mEq tablet,extended release       predniSONE (DELTASONE) 20 MG tablet prednisone 20 mg tablet       travoprost (TRAVATAN Z) 0.004 % Ophth ophthalmic solution Travatan Z 0.004 % eye drops        No current facility-administered medications on file prior to visit.      Family History  History reviewed. No pertinent family history.      Social History       Tobacco Use  Smoking Status Never  Smokeless Tobacco Never      Social History  Social History        Socioeconomic History   Marital status: Single  Tobacco Use   Smoking status: Never   Smokeless tobacco: Never  Substance and Sexual Activity   Alcohol use: Never   Drug use: Never        Objective:         Vitals:    11/05/21 1001  BP: 122/80  Pulse: 70  Temp: 37 C (98.6 F)  SpO2: 98%  Weight: 89.4 kg (197 lb)  Height: 163.8 cm (5' 4.5")    Body mass index is 33.29 kg/m.       Gen:  No acute distress.  Well nourished and well groomed.   Neurological: Alert and oriented to person, place, and time. Coordination normal.  Head: Normocephalic  and atraumatic.  Eyes: Conjunctivae are normal. Pupils are equal, round, and reactive to light. No scleral icterus.  Neck: Normal range of motion. Neck supple. No tracheal deviation or thyromegaly present.  Cardiovascular: Normal rate, regular rhythm, normal heart sounds and intact distal pulses.  Exam reveals no gallop and no friction rub.  No murmur heard. Breast: left breast smaller than right.  No palpable masses. No nipple retraction or skin dimpling.  No LAD.   Respiratory: Effort normal.  No respiratory distress. No chest wall tenderness. Breath sounds normal.  No wheezes, rales or rhonchi.  GI: Soft. Bowel sounds are normal. The abdomen is soft and nontender.  There is no rebound and no guarding.  Musculoskeletal: Normal range of motion. Extremities are nontender.  Lymphadenopathy: No cervical, preauricular, postauricular or axillary adenopathy is present Skin: Skin is warm and dry. No rash noted. No diaphoresis. No erythema. No pallor. No clubbing, cyanosis, or edema.   Psychiatric: Normal mood and affect. Behavior is normal. Judgment and thought content normal.      Labs n/a   Assessment and Plan:    Recurrent breast cancer, left (CMS-HCC) Patient most likely has a recurrent left breast cancer.  We do not yet have the receptors.  We will review this at conference tomorrow morning.  Pathology will assess her previous cancer to look at the morphology as well.  If this is hormone positive, this  would definitely be a new cancer.  We will discuss her case and determine recommendations.  She is 68 and will be 87 in a few days.  Typically, for a small cancer she would be eligible for repeat lumpectomy without radiation.  However this is mainly true for hormone positive tumors.  We will also discuss lymph node mapping.  If she has triple negative cancer again, I suspect that chemotherapy will be reconsidered and that she may receive a short course given that this would most likely be  recurrent.   I discussed that if we plan to do anything other than a lumpectomy, I will call her back to discuss.  I reviewed risks of surgery.   The surgical procedure was described to the patient.  I discussed the incision type and location and that we would need radiology involved on with a wire or seed marker and/or sentinel node.       The risks and benefits of the procedure were described to the patient and she wishes to proceed.     We discussed the risks bleeding, infection, damage to other structures, need for further procedures/surgeries.  We discussed the risk of seroma.  The patient was advised if the area in the breast in cancer, we may need to go back to surgery for additional tissue to obtain negative margins or for a lymph node biopsy. The patient was advised that these are the most common complications, but that others can occur as well.  They were advised against taking aspirin or other anti-inflammatory agents/blood thinners the week before surgery.       At risk for hemorrhage associated with surgery Given her prior postop hemorrhage of her breast, I recommend being extra careful not to take any nonsteroidal anti-inflammatory agents.  I reviewed that aspirin is an [place] powders and Excedrin as well as other combination medications.  She states that she has already converted to just taking Tylenol for pain and avoiding other medications.       No follow-ups on file.     Milus Height, MD FACS Surgical Oncology, General Surgery, Trauma and [place] [facility] [place]

## 2021-11-26 ENCOUNTER — Encounter (HOSPITAL_COMMUNITY): Payer: Self-pay | Admitting: General Surgery

## 2021-11-26 ENCOUNTER — Ambulatory Visit (HOSPITAL_COMMUNITY): Payer: Medicare HMO | Admitting: Anesthesiology

## 2021-11-26 ENCOUNTER — Ambulatory Visit (HOSPITAL_COMMUNITY)
Admission: RE | Admit: 2021-11-26 | Discharge: 2021-11-27 | Disposition: A | Discharge status: Home / Self Care | Payer: Medicare HMO | Attending: General Surgery | Admitting: General Surgery

## 2021-11-26 ENCOUNTER — Other Ambulatory Visit: Payer: Self-pay

## 2021-11-26 ENCOUNTER — Encounter (HOSPITAL_COMMUNITY)
Admission: RE | Disposition: A | Discharge status: Home / Self Care | Payer: Self-pay | Source: Home / Self Care | Attending: General Surgery

## 2021-11-26 DIAGNOSIS — Z6833 Body mass index (BMI) 33.0-33.9, adult: Secondary | ICD-10-CM | POA: Insufficient documentation

## 2021-11-26 DIAGNOSIS — C50412 Malignant neoplasm of upper-outer quadrant of left female breast: Secondary | ICD-10-CM | POA: Diagnosis present

## 2021-11-26 DIAGNOSIS — C50912 Malignant neoplasm of unspecified site of left female breast: Secondary | ICD-10-CM | POA: Diagnosis not present

## 2021-11-26 DIAGNOSIS — E039 Hypothyroidism, unspecified: Secondary | ICD-10-CM | POA: Insufficient documentation

## 2021-11-26 DIAGNOSIS — J45909 Unspecified asthma, uncomplicated: Secondary | ICD-10-CM | POA: Diagnosis not present

## 2021-11-26 DIAGNOSIS — Z171 Estrogen receptor negative status [ER-]: Secondary | ICD-10-CM | POA: Insufficient documentation

## 2021-11-26 DIAGNOSIS — E669 Obesity, unspecified: Secondary | ICD-10-CM | POA: Insufficient documentation

## 2021-11-26 DIAGNOSIS — R921 Mammographic calcification found on diagnostic imaging of breast: Secondary | ICD-10-CM | POA: Diagnosis not present

## 2021-11-26 DIAGNOSIS — Z79899 Other long term (current) drug therapy: Secondary | ICD-10-CM | POA: Insufficient documentation

## 2021-11-26 DIAGNOSIS — C50212 Malignant neoplasm of upper-inner quadrant of left female breast: Secondary | ICD-10-CM | POA: Diagnosis not present

## 2021-11-26 DIAGNOSIS — I10 Essential (primary) hypertension: Secondary | ICD-10-CM | POA: Diagnosis not present

## 2021-11-26 DIAGNOSIS — E119 Type 2 diabetes mellitus without complications: Secondary | ICD-10-CM | POA: Insufficient documentation

## 2021-11-26 DIAGNOSIS — G8918 Other acute postprocedural pain: Secondary | ICD-10-CM | POA: Diagnosis not present

## 2021-11-26 DIAGNOSIS — D630 Anemia in neoplastic disease: Secondary | ICD-10-CM | POA: Diagnosis not present

## 2021-11-26 HISTORY — PX: AXILLARY SENTINEL NODE BIOPSY: SHX5738

## 2021-11-26 HISTORY — PX: MASTECTOMY W/ SENTINEL NODE BIOPSY: SHX2001

## 2021-11-26 LAB — GLUCOSE, CAPILLARY
Glucose-Capillary: 85 mg/dL (ref 70–99)
Glucose-Capillary: 111 mg/dL — ABNORMAL HIGH (ref 70–99)

## 2021-11-26 SURGERY — MASTECTOMY WITH SENTINEL LYMPH NODE BIOPSY
Anesthesia: General | Site: Breast | Laterality: Left

## 2021-11-26 MED ORDER — FLUTICASONE PROPIONATE 50 MCG/ACT NA SUSP: 2.0000 | Freq: Every day | NASAL | Status: DC | PRN

## 2021-11-26 MED ORDER — PROPOFOL 10 MG/ML IV BOLUS
INTRAVENOUS | Status: DC | PRN
Start: 2021-11-26 — End: 2021-11-26
  Administered 2021-11-26: 120 mg via INTRAVENOUS

## 2021-11-26 MED ORDER — HEMOSTATIC AGENTS (NO CHARGE) OPTIME
TOPICAL | Status: DC | PRN
  Administered 2021-11-26: 1 via TOPICAL

## 2021-11-26 MED ORDER — DIPHENHYDRAMINE HCL 12.5 MG/5ML PO ELIX
12.5000 mg | ORAL_SOLUTION | Freq: Four times a day (QID) | ORAL | Status: DC | PRN
Start: 2021-11-26 — End: 2021-11-27

## 2021-11-26 MED ORDER — ONDANSETRON HCL 4 MG/2ML IJ SOLN: 4.0000 mg | Freq: Four times a day (QID) | INTRAMUSCULAR | Status: DC | PRN

## 2021-11-26 MED ORDER — 0.9 % SODIUM CHLORIDE (POUR BTL) OPTIME
TOPICAL | Status: DC | PRN
  Administered 2021-11-26 (×2): 1000 mL

## 2021-11-26 MED ORDER — ACETAMINOPHEN 500 MG PO TABS: 1000.0000 mg | ORAL_TABLET | Freq: Four times a day (QID) | ORAL | Status: DC | PRN

## 2021-11-26 MED ORDER — LEVOTHYROXINE SODIUM 88 MCG PO TABS
88.0000 ug | ORAL_TABLET | Freq: Every day | ORAL | Status: DC
  Administered 2021-11-27: 88 ug via ORAL
  Filled 2021-11-26: qty 1

## 2021-11-26 MED ORDER — PHENYLEPHRINE HCL-NACL 20-0.9 MG/250ML-% IV SOLN
INTRAVENOUS | Status: DC | PRN
  Administered 2021-11-26: 75 ug/min via INTRAVENOUS

## 2021-11-26 MED ORDER — MAGTRACE LYMPHATIC TRACER
INTRAMUSCULAR | Status: DC | PRN
  Administered 2021-11-26: 2 mL via INTRAMUSCULAR

## 2021-11-26 MED ORDER — FENTANYL CITRATE (PF) 100 MCG/2ML IJ SOLN: 25.0000 ug | INTRAMUSCULAR | Status: DC | PRN

## 2021-11-26 MED ORDER — BUPIVACAINE-EPINEPHRINE (PF) 0.5% -1:200000 IJ SOLN
INTRAMUSCULAR | Status: DC | PRN
  Administered 2021-11-26: 30 mL via PERINEURAL

## 2021-11-26 MED ORDER — FUROSEMIDE 20 MG PO TABS: 20.0000 mg | ORAL_TABLET | Freq: Every day | ORAL | Status: DC | PRN

## 2021-11-26 MED ORDER — CLINDAMYCIN PHOSPHATE 900 MG/50ML IV SOLN
900.0000 mg | INTRAVENOUS | Status: AC
  Administered 2021-11-26: 900 mg via INTRAVENOUS
  Filled 2021-11-26: qty 50

## 2021-11-26 MED ORDER — FENTANYL CITRATE (PF) 250 MCG/5ML IJ SOLN
INTRAMUSCULAR | Status: DC | PRN
  Administered 2021-11-26 (×6): 25 ug via INTRAVENOUS
  Administered 2021-11-26: 50 ug via INTRAVENOUS
  Administered 2021-11-26 (×2): 25 ug via INTRAVENOUS

## 2021-11-26 MED ORDER — MIDAZOLAM HCL 2 MG/2ML IJ SOLN
INTRAMUSCULAR | Status: AC
  Filled 2021-11-26: qty 2

## 2021-11-26 MED ORDER — LIDOCAINE 2% (20 MG/ML) 5 ML SYRINGE
INTRAMUSCULAR | Status: DC | PRN
Start: 2021-11-26 — End: 2021-11-26
  Administered 2021-11-26: 60 mg via INTRAVENOUS

## 2021-11-26 MED ORDER — DIPHENHYDRAMINE HCL 50 MG/ML IJ SOLN: 12.5000 mg | Freq: Four times a day (QID) | INTRAMUSCULAR | Status: DC | PRN

## 2021-11-26 MED ORDER — METHOCARBAMOL 500 MG PO TABS: 500.0000 mg | ORAL_TABLET | Freq: Four times a day (QID) | ORAL | Status: DC | PRN

## 2021-11-26 MED ORDER — OXYCODONE HCL 5 MG PO TABS: 5.0000 mg | ORAL_TABLET | Freq: Four times a day (QID) | ORAL | Status: DC | PRN

## 2021-11-26 MED ORDER — MECLIZINE HCL 12.5 MG PO TABS
12.5000 mg | ORAL_TABLET | Freq: Three times a day (TID) | ORAL | Status: DC | PRN
  Filled 2021-11-26: qty 1

## 2021-11-26 MED ORDER — LACTATED RINGERS IV SOLN: INTRAVENOUS | Status: DC

## 2021-11-26 MED ORDER — CHLORHEXIDINE GLUCONATE CLOTH 2 % EX PADS: 6.0000 | MEDICATED_PAD | Freq: Once | CUTANEOUS | Status: DC

## 2021-11-26 MED ORDER — METHYLENE BLUE 0.5 % INJ SOLN
INTRAVENOUS | Status: AC
  Filled 2021-11-26: qty 10

## 2021-11-26 MED ORDER — DEXAMETHASONE SODIUM PHOSPHATE 10 MG/ML IJ SOLN
INTRAMUSCULAR | Status: DC | PRN
  Administered 2021-11-26: 5 mg via INTRAVENOUS

## 2021-11-26 MED ORDER — FENTANYL CITRATE (PF) 250 MCG/5ML IJ SOLN
INTRAMUSCULAR | Status: AC
  Filled 2021-11-26: qty 5

## 2021-11-26 MED ORDER — CETAPHIL MOISTURIZING EX LOTN: 1.0000 "application " | TOPICAL_LOTION | CUTANEOUS | Status: DC | PRN

## 2021-11-26 MED ORDER — HYDRALAZINE HCL 20 MG/ML IJ SOLN: 10.0000 mg | INTRAMUSCULAR | Status: DC | PRN

## 2021-11-26 MED ORDER — FENTANYL CITRATE (PF) 100 MCG/2ML IJ SOLN
INTRAMUSCULAR | Status: AC
  Administered 2021-11-26: 50 ug
  Filled 2021-11-26: qty 2

## 2021-11-26 MED ORDER — SODIUM CHLORIDE (PF) 0.9 % IJ SOLN
INTRAMUSCULAR | Status: DC | PRN
  Administered 2021-11-26: 5 mL via INTRADERMAL

## 2021-11-26 MED ORDER — POLYVINYL ALCOHOL 1.4 % OP SOLN: 1.0000 [drp] | Freq: Two times a day (BID) | OPHTHALMIC | Status: DC | PRN

## 2021-11-26 MED ORDER — ONDANSETRON 4 MG PO TBDP: 4.0000 mg | ORAL_TABLET | Freq: Four times a day (QID) | ORAL | Status: DC | PRN

## 2021-11-26 MED ORDER — ALBUTEROL SULFATE (2.5 MG/3ML) 0.083% IN NEBU: 2.5000 mg | INHALATION_SOLUTION | Freq: Four times a day (QID) | RESPIRATORY_TRACT | Status: DC | PRN

## 2021-11-26 MED ORDER — ACETAMINOPHEN 500 MG PO TABS
1000.0000 mg | ORAL_TABLET | ORAL | Status: AC
  Administered 2021-11-26: 1000 mg via ORAL
  Filled 2021-11-26: qty 2

## 2021-11-26 MED ORDER — SENNA 8.6 MG PO TABS
1.0000 | ORAL_TABLET | Freq: Two times a day (BID) | ORAL | Status: DC
  Administered 2021-11-27: 8.6 mg via ORAL
  Filled 2021-11-26 (×2): qty 1

## 2021-11-26 MED ORDER — ENSURE PRE-SURGERY PO LIQD: 296.0000 mL | Freq: Once | ORAL | Status: DC

## 2021-11-26 MED ORDER — PROPOFOL 10 MG/ML IV BOLUS
INTRAVENOUS | Status: AC
  Filled 2021-11-26: qty 20

## 2021-11-26 MED ORDER — AMLODIPINE BESYLATE 10 MG PO TABS
10.0000 mg | ORAL_TABLET | Freq: Every day | ORAL | Status: DC
  Administered 2021-11-27: 10 mg via ORAL
  Filled 2021-11-26 (×2): qty 1

## 2021-11-26 MED ORDER — CHLORHEXIDINE GLUCONATE 0.12 % MT SOLN
OROMUCOSAL | Status: AC
  Administered 2021-11-26: 15 mL
  Filled 2021-11-26: qty 15

## 2021-11-26 MED ORDER — IRBESARTAN 300 MG PO TABS
300.0000 mg | ORAL_TABLET | Freq: Every day | ORAL | Status: DC
  Administered 2021-11-27: 300 mg via ORAL
  Filled 2021-11-26 (×2): qty 1

## 2021-11-26 MED ORDER — PHENYLEPHRINE 40 MCG/ML (10ML) SYRINGE FOR IV PUSH (FOR BLOOD PRESSURE SUPPORT)
PREFILLED_SYRINGE | INTRAVENOUS | Status: DC | PRN
Start: 2021-11-26 — End: 2021-11-26
  Administered 2021-11-26 (×2): 80 ug via INTRAVENOUS

## 2021-11-26 MED ORDER — CHLORTHALIDONE 25 MG PO TABS
25.0000 mg | ORAL_TABLET | Freq: Every day | ORAL | Status: DC
  Administered 2021-11-27: 25 mg via ORAL
  Filled 2021-11-26 (×2): qty 1

## 2021-11-26 MED ORDER — ONDANSETRON HCL 4 MG/2ML IJ SOLN: 4.0000 mg | Freq: Once | INTRAMUSCULAR | Status: DC | PRN

## 2021-11-26 MED ORDER — CLINDAMYCIN PHOSPHATE 600 MG/50ML IV SOLN
600.0000 mg | Freq: Once | INTRAVENOUS | Status: AC
  Administered 2021-11-26: 600 mg via INTRAVENOUS
  Filled 2021-11-26: qty 50

## 2021-11-26 MED ORDER — KCL IN DEXTROSE-NACL 20-5-0.45 MEQ/L-%-% IV SOLN
INTRAVENOUS | Status: DC
  Filled 2021-11-26: qty 1000

## 2021-11-26 MED ORDER — FENTANYL CITRATE PF 50 MCG/ML IJ SOSY
50.0000 ug | PREFILLED_SYRINGE | Freq: Once | INTRAMUSCULAR | Status: DC
  Filled 2021-11-26: qty 1

## 2021-11-26 MED ORDER — POTASSIUM CHLORIDE CRYS ER 20 MEQ PO TBCR: 20.0000 meq | EXTENDED_RELEASE_TABLET | Freq: Every day | ORAL | Status: DC | PRN

## 2021-11-26 MED ORDER — SODIUM CHLORIDE (PF) 0.9 % IJ SOLN
INTRAMUSCULAR | Status: AC
  Filled 2021-11-26: qty 10

## 2021-11-26 MED ORDER — MORPHINE SULFATE (PF) 2 MG/ML IV SOLN: 1.0000 mg | INTRAVENOUS | Status: DC | PRN

## 2021-11-26 SURGICAL SUPPLY — 57 items
BAG COUNTER SPONGE SURGICOUNT (BAG) ×3 IMPLANT
BINDER BREAST XLRG (GAUZE/BANDAGES/DRESSINGS) ×1 IMPLANT
BIOPATCH RED 1 DISK 7.0 (GAUZE/BANDAGES/DRESSINGS) ×3 IMPLANT
BNDG COHESIVE 4X5 TAN STRL (GAUZE/BANDAGES/DRESSINGS) ×3 IMPLANT
CANISTER SUCT 3000ML PPV (MISCELLANEOUS) ×3 IMPLANT
CHLORAPREP W/TINT 26 (MISCELLANEOUS) ×3 IMPLANT
CLIP VESOCCLUDE LG 6/CT (CLIP) ×3 IMPLANT
CLIP VESOCCLUDE MED 6/CT (CLIP) ×3 IMPLANT
CLIP VESOCCLUDE SM WIDE 6/CT (CLIP) ×3 IMPLANT
CLSR STERI-STRIP ANTIMIC 1/2X4 (GAUZE/BANDAGES/DRESSINGS) ×1 IMPLANT
CNTNR URN SCR LID CUP LEK RST (MISCELLANEOUS) ×2 IMPLANT
CONT SPEC 4OZ STRL OR WHT (MISCELLANEOUS) ×3
COVER PROBE W GEL 5X96 (DRAPES) ×3 IMPLANT
COVER SURGICAL LIGHT HANDLE (MISCELLANEOUS) ×3 IMPLANT
DERMABOND ADHESIVE PROPEN (GAUZE/BANDAGES/DRESSINGS) ×1
DERMABOND ADVANCED (GAUZE/BANDAGES/DRESSINGS) ×1
DERMABOND ADVANCED .7 DNX12 (GAUZE/BANDAGES/DRESSINGS) ×2 IMPLANT
DERMABOND ADVANCED .7 DNX6 (GAUZE/BANDAGES/DRESSINGS) IMPLANT
DRAIN CHANNEL 19F RND (DRAIN) ×3 IMPLANT
DRSG TEGADERM 4X4.5 CHG (GAUZE/BANDAGES/DRESSINGS) ×1 IMPLANT
DRSG TEGADERM 4X4.75 (GAUZE/BANDAGES/DRESSINGS) ×3 IMPLANT
ELECT BLADE 4.0 EZ CLEAN MEGAD (MISCELLANEOUS) ×3
ELECT CAUTERY BLADE 6.4 (BLADE) ×3 IMPLANT
ELECT REM PT RETURN 9FT ADLT (ELECTROSURGICAL) ×3
ELECTRODE BLDE 4.0 EZ CLN MEGD (MISCELLANEOUS) IMPLANT
ELECTRODE REM PT RTRN 9FT ADLT (ELECTROSURGICAL) ×2 IMPLANT
EVACUATOR SILICONE 100CC (DRAIN) ×3 IMPLANT
GLOVE SURG ENC MOIS LTX SZ6 (GLOVE) ×3 IMPLANT
GLOVE SURG UNDER LTX SZ6.5 (GLOVE) ×3 IMPLANT
GOWN STRL REUS W/ TWL LRG LVL3 (GOWN DISPOSABLE) ×2 IMPLANT
GOWN STRL REUS W/TWL 2XL LVL3 (GOWN DISPOSABLE) ×3 IMPLANT
GOWN STRL REUS W/TWL LRG LVL3 (GOWN DISPOSABLE) ×3
HEMOSTAT HEMOBLAST BELLOWS (HEMOSTASIS) ×1 IMPLANT
KIT BASIN OR (CUSTOM PROCEDURE TRAY) ×3 IMPLANT
KIT TURNOVER KIT B (KITS) ×3 IMPLANT
LIGHT WAVEGUIDE WIDE FLAT (MISCELLANEOUS) ×2 IMPLANT
MARKER SKIN DUAL TIP RULER LAB (MISCELLANEOUS) ×3 IMPLANT
NDL 18GX1X1/2 (RX/OR ONLY) (NEEDLE) IMPLANT
NDL HYPO 25GX1X1/2 BEV (NEEDLE) IMPLANT
NEEDLE 18GX1X1/2 (RX/OR ONLY) (NEEDLE) IMPLANT
NEEDLE HYPO 25GX1X1/2 BEV (NEEDLE) ×6 IMPLANT
NS IRRIG 1000ML POUR BTL (IV SOLUTION) ×3 IMPLANT
PACK GENERAL/GYN (CUSTOM PROCEDURE TRAY) ×3 IMPLANT
PACK UNIVERSAL I (CUSTOM PROCEDURE TRAY) ×3 IMPLANT
PAD ABD 7.5X8 STRL (GAUZE/BANDAGES/DRESSINGS) ×2 IMPLANT
PAD ARMBOARD 7.5X6 YLW CONV (MISCELLANEOUS) ×3 IMPLANT
STOCKINETTE IMPERVIOUS 9X36 MD (GAUZE/BANDAGES/DRESSINGS) ×3 IMPLANT
STRIP CLOSURE SKIN 1/2X4 (GAUZE/BANDAGES/DRESSINGS) ×3 IMPLANT
SUT ETHILON 2 0 FS 18 (SUTURE) ×3 IMPLANT
SUT MON AB 4-0 PC3 18 (SUTURE) ×3 IMPLANT
SUT SILK 2 0 (SUTURE) ×3
SUT SILK 2 0 PERMA HAND 18 BK (SUTURE) ×1 IMPLANT
SUT SILK 2-0 18XBRD TIE 12 (SUTURE) ×2 IMPLANT
SUT VIC AB 3-0 SH 8-18 (SUTURE) ×3 IMPLANT
SYR 50ML LL SCALE MARK (SYRINGE) ×3 IMPLANT
TOWEL GREEN STERILE (TOWEL DISPOSABLE) ×3 IMPLANT
TOWEL GREEN STERILE FF (TOWEL DISPOSABLE) ×3 IMPLANT

## 2021-11-26 NOTE — Transfer of Care (Signed)
Immediate Anesthesia Transfer of Care Note  Patient: Alisha [name]  Procedure(s) Performed: LEFT BREAST MASTECTOMY (Left: Breast) LEFT AXILLARY SENTINEL NODE BIOPSY (Left: Axilla)  Patient Location: PACU  Anesthesia Type:General  Level of Consciousness: awake and alert   Airway & Oxygen Therapy: Patient Spontanous Breathing and Patient connected to nasal cannula oxygen  Post-op Assessment: Report given to RN and Post -op Vital signs reviewed and stable  Post vital signs: Reviewed and stable  Last Vitals:  Vitals Value Taken Time  BP 151/74 11/26/21 1415  Temp    Pulse 53 11/26/21 1418  Resp 0 11/26/21 1418  SpO2 96 % 11/26/21 1418  Vitals shown include unvalidated device data.  Last Pain:  Vitals:   11/26/21 0925  TempSrc:   PainSc: 0-No pain         Complications: No notable events documented.

## 2021-11-26 NOTE — Plan of Care (Signed)
  Problem: Nutrition: Goal: Adequate nutrition will be maintained Outcome: Progressing   Problem: Pain Managment: Goal: General experience of comfort will improve Outcome: Progressing   Problem: Safety: Goal: Ability to remain free from injury will improve Outcome: Progressing   

## 2021-11-26 NOTE — Interval H&P Note (Signed)
History and Physical Interval Note:  11/26/2021 10:51 AM  Alisha [name]  has presented today for surgery, with the diagnosis of LEFT BREAST CANCER.  The various methods of treatment have been discussed with the patient and family. After consideration of risks, benefits and other options for treatment, the patient has consented to  Procedure(s): LEFT MASTECTOMY WITH SENTINEL LYMPH NODE BIOPSY (Left) as a surgical intervention.  The patient's history has been reviewed, patient examined, no change in status, stable for surgery.  I have reviewed the patient's chart and labs.  Questions were answered to the patient's satisfaction.     Stark Klein

## 2021-11-26 NOTE — Plan of Care (Signed)

## 2021-11-26 NOTE — Anesthesia Procedure Notes (Signed)
Procedure Name: LMA Insertion Date/Time: 11/26/2021 11:35 AM Performed by: Eligha Bridegroom, CRNA Pre-anesthesia Checklist: Patient identified, Emergency Drugs available, Suction available, Patient being monitored and Timeout performed Patient Re-evaluated:Patient Re-evaluated prior to induction Oxygen Delivery Method: Circle system utilized Preoxygenation: Pre-oxygenation with 100% oxygen Induction Type: IV induction LMA: LMA inserted LMA Size: 4.0 Number of attempts: 1 Placement Confirmation: positive ETCO2 and breath sounds checked- equal and bilateral Tube secured with: Tape Dental Injury: Teeth and Oropharynx as per pre-operative assessment

## 2021-11-26 NOTE — Anesthesia Postprocedure Evaluation (Signed)
Anesthesia Post Note  Patient: Edgardo Roys Mcglinn  Procedure(s) Performed: LEFT BREAST MASTECTOMY (Left: Breast) LEFT AXILLARY SENTINEL NODE BIOPSY (Left: Axilla)     Patient location during evaluation: PACU Anesthesia Type: General Level of consciousness: awake and alert Pain management: pain level controlled Vital Signs Assessment: post-procedure vital signs reviewed and stable Respiratory status: spontaneous breathing, nonlabored ventilation, respiratory function stable and patient connected to nasal cannula oxygen Cardiovascular status: blood pressure returned to baseline and stable Postop Assessment: no apparent nausea or vomiting Anesthetic complications: no   No notable events documented.  Last Vitals:  Vitals:   11/26/21 1700 11/26/21 1801  BP: (!) 157/52 (!) 138/46  Pulse: (!) 57 61  Resp: 10 17  Temp: 36.8 C 36.9 C  SpO2: 99% 97%    Last Pain:  Vitals:   11/26/21 1801  TempSrc: Oral  PainSc:                  Santa Lighter

## 2021-11-26 NOTE — Op Note (Signed)
Left Mastectomy with Sentinel Node Biopsy Procedure Note  Indications: This patient presents with history of recurrent left breast cancer   Pre-operative Diagnosis: left breast cancer, cT1cN0M0, upper inner quadrant, grade 2 invasive ductal carcinoma, receptors -/-/-  Post-operative Diagnosis: same  Surgeon: Stark Klein   Assistant: Arrie Aran, RNFA  Anesthesia: General endotracheal anesthesia and pectoral block  ASA Class: 3  Procedure Details  The patient was seen in the Holding Room. The risks, benefits, complications, treatment options, and expected outcomes were discussed with the patient. The possibilities of reaction to medication, pulmonary aspiration, bleeding, infection, the need for additional procedures, failure to diagnose a condition, and creating a complication requiring transfusion or operation were discussed with the patient. The patient concurred with the proposed plan, giving informed consent.  The site of surgery properly noted/marked. The patient was taken to Operating Room # 2, identified as Psychologist, counselling and the procedure verified as Left Mastectomy and Sentinel Node Biopsy. After induction of anesthesia, the left arm, breast, and chest were prepped and draped in standard fashion.  A Time Out was held and the above information confirmed.    The MagTrace and methylene blue were injected in the subareolar location.   The borders of the breast were identified and marked.  The incisions of the breast were drawn out to make sure incision lines were equidistant in length and that the prior incision was incorporated.    The superior incision was made with the #10 blade.  Mastectomy hooks were used to provide elevation of the skin edges, and the cautery was used to create the mastectomy flaps.  The dissection was taken to the fascia of the pectoralis major.  The penetrating vessels were clipped as needed.  The superior flap was taken medially to the lateral sternal border,  superiorly to the inferior border of the clavicle.  The inferior flap was similarly created, inferiorly to the inframammary fold and laterally to the border of the latissimus.  The breast was taken off including the pectoralis fascia and the axillary tail marked.    Using a Sentimag probe, axillary sentinel nodes were identified.  Three deep level 2 axillary sentinel nodes were removed and submitted to pathology.  The findings are below.  The lymphovascular channels were clipped with metal clips.        The wound was irrigated. One 19 Blake drain was placed laterally and secured with a 2-0 nylon.   Hemostasis was achieved with cautery.  Hemoblast was applied to the pectoralis.  The wound was irrigated and closed with a 3-0 Vicryl deep dermal interrupted sutures and 4-0 Vicryl subcuticular closure in layers.    Sterile dressings were applied. At the end of the operation, all sponge, instrument, and needle counts were correct.  Findings: grossly clear surgical margins  Estimated Blood Loss: 25 mL          Drains: 19 Fr blake drain in left axilla                Specimens: left breast and three deep left axillary sentinel nodes         Complications:  None; patient tolerated the procedure well.         Disposition: PACU - hemodynamically stable.         Condition: stable

## 2021-11-26 NOTE — Anesthesia Procedure Notes (Signed)
Anesthesia Regional Block: Pectoralis block   Pre-Anesthetic Checklist: , timeout performed,  Correct Patient, Correct Site, Correct Laterality,  Correct Procedure, Correct Position, site marked,  Risks and benefits discussed,  Surgical consent,  Pre-op evaluation,  At surgeon's request and post-op pain management  Laterality: Left  Prep: chloraprep       Needles:  Injection technique: Single-shot  Needle Type: Echogenic Needle     Needle Length: 9cm  Needle Gauge: 21     Additional Needles:   Procedures:,,,, ultrasound used (permanent image in chart),,    Narrative:  Start time: 11/26/2021 10:09 AM End time: 11/26/2021 10:15 AM Injection made incrementally with aspirations every 5 mL.  Performed by: Personally  Anesthesiologist: Santa Lighter, MD  Additional Notes: No pain on injection. No increased resistance to injection. Injection made in 5cc increments.  Good needle visualization.  Patient tolerated procedure well.

## 2021-11-26 NOTE — Discharge Instructions (Addendum)
[facility] [phone]  MASTECTOMY: POST OP INSTRUCTIONS  Always review your discharge instruction sheet given to you by the facility where your surgery was performed. IF YOU HAVE DISABILITY OR FAMILY LEAVE FORMS, YOU MUST BRING THEM TO THE OFFICE FOR PROCESSING.   DO NOT GIVE THEM TO YOUR DOCTOR. A prescription for pain medication may be given to you upon discharge.  Take your pain medication as prescribed, if needed.  If narcotic pain medicine is not needed, then you may take acetaminophen (Tylenol) or ibuprofen (Advil) as needed. Take your usually prescribed medications unless otherwise directed. If you need a refill on your pain medication, please contact your pharmacy.  They will contact our office to request authorization.  Prescriptions will not be filled after 5pm or on week-ends. You should follow a light diet the first few days after arrival home, such as soup and crackers, etc.  Resume your normal diet the day after surgery. Most patients will experience some swelling and bruising on the chest and underarm.  Ice packs will help.  Swelling and bruising can take several days to resolve.  It is common to experience some constipation if taking pain medication after surgery.  Increasing fluid intake and taking a stool softener (such as Colace) will usually help or prevent this problem from occurring.  A mild laxative (Milk of Magnesia or Miralax) should be taken according to package instructions if there are no bowel movements after 48 hours. Unless discharge instructions indicate otherwise, leave your bandage dry and in place until your next appointment in 3-5 days.  You may take a limited sponge bath.  No tube baths or showers until the drains are removed.  You may have steri-strips (small skin tapes) in place directly over the incision.  These strips should be left on the skin for 7-10 days.  If your surgeon used skin glue on the incision, you may shower in 24 hours.   The glue will flake off over the next 2-3 weeks.  Any sutures or staples will be removed at the office during your follow-up visit. DRAINS:  If you have drains in place, it is important to keep a list of the amount of drainage produced each day in your drains.  Before leaving the hospital, you should be instructed on drain care.  Call our office if you have any questions about your drains. ACTIVITIES:  You may resume regular (light) daily activities beginning the next day--such as daily self-care, walking, climbing stairs--gradually increasing activities as tolerated.  You may have sexual intercourse when it is comfortable.  Refrain from any heavy lifting or straining until approved by your doctor. You may drive when you are no longer taking prescription pain medication, you can comfortably wear a seatbelt, and you can safely maneuver your car and apply brakes. RETURN TO WORK:  __________________________________________________________ Dennis Bast should see your doctor in the office for a follow-up appointment approximately 3-5 days after your surgery.  Your doctors nurse will typically make your follow-up appointment when she calls you with your pathology report.  Expect your pathology report 2-3 business days after your surgery.  You may call to check if you do not hear from [place] after three days.   OTHER INSTRUCTIONS: ______________________________________________________________________________________________ ____________________________________________________________________________________________ WHEN TO CALL YOUR DOCTOR: Fever over 101.0 Nausea and/or vomiting Extreme swelling or bruising Continued bleeding from incision. Increased pain, redness, or drainage from the incision. The clinic staff is available to answer your questions during regular business hours.  Please dont hesitate  to call and ask to speak to one of the nurses for clinical concerns.  If you have a medical emergency, go to the  nearest emergency room or call 911.  A surgeon from [facility] is always on call at the hospital. [address], [place], [place], [place]  [address] ? [address], [place], [place]   [address] [phone] ? [phone] ? FAX [phone]

## 2021-11-26 NOTE — Anesthesia Preprocedure Evaluation (Addendum)
Anesthesia Evaluation  Patient identified by MRN, date of birth, ID band Patient awake    Reviewed: Allergy & Precautions, NPO status , Patient's Chart, lab work & pertinent test results  Airway Mallampati: II  TM Distance: >3 FB Neck ROM: Full    Dental  (+) Dental Advisory Given, Missing, Edentulous Upper   Pulmonary asthma ,    Pulmonary exam normal breath sounds clear to auscultation       Cardiovascular hypertension, Pt. on medications + DOE  Normal cardiovascular exam Rhythm:Regular Rate:Normal     Neuro/Psych negative neurological ROS  negative psych ROS   GI/Hepatic negative GI ROS, Neg liver ROS,   Endo/Other  diabetes, Type 2Hypothyroidism Obesity   Renal/GU negative Renal ROS     Musculoskeletal  (+) Arthritis ,   Abdominal   Peds  Hematology  (+) Blood dyscrasia, anemia ,   Anesthesia Other Findings Day of surgery medications reviewed with the patient.  LEFT BREAST CANCER  Reproductive/Obstetrics                            Anesthesia Physical Anesthesia Plan  ASA: 3  Anesthesia Plan: General   Post-op Pain Management: Regional block and Tylenol PO (pre-op)   Induction: Intravenous  PONV Risk Score and Plan: 3 and Dexamethasone and Ondansetron  Airway Management Planned: Oral ETT  Additional Equipment:   Intra-op Plan:   Post-operative Plan: Extubation in OR  Informed Consent: I have reviewed the patients History and Physical, chart, labs and discussed the procedure including the risks, benefits and alternatives for the proposed anesthesia with the patient or authorized representative who has indicated his/her understanding and acceptance.     Dental advisory given  Plan Discussed with: CRNA  Anesthesia Plan Comments:         Anesthesia Quick Evaluation

## 2021-11-27 ENCOUNTER — Encounter (HOSPITAL_COMMUNITY): Payer: Self-pay | Admitting: General Surgery

## 2021-11-27 DIAGNOSIS — Z79899 Other long term (current) drug therapy: Secondary | ICD-10-CM | POA: Diagnosis not present

## 2021-11-27 DIAGNOSIS — E039 Hypothyroidism, unspecified: Secondary | ICD-10-CM | POA: Diagnosis not present

## 2021-11-27 DIAGNOSIS — E119 Type 2 diabetes mellitus without complications: Secondary | ICD-10-CM | POA: Diagnosis not present

## 2021-11-27 DIAGNOSIS — Z6833 Body mass index (BMI) 33.0-33.9, adult: Secondary | ICD-10-CM | POA: Diagnosis not present

## 2021-11-27 DIAGNOSIS — J45909 Unspecified asthma, uncomplicated: Secondary | ICD-10-CM | POA: Diagnosis not present

## 2021-11-27 DIAGNOSIS — E669 Obesity, unspecified: Secondary | ICD-10-CM | POA: Diagnosis not present

## 2021-11-27 DIAGNOSIS — Z171 Estrogen receptor negative status [ER-]: Secondary | ICD-10-CM | POA: Diagnosis not present

## 2021-11-27 DIAGNOSIS — C50212 Malignant neoplasm of upper-inner quadrant of left female breast: Secondary | ICD-10-CM | POA: Diagnosis not present

## 2021-11-27 DIAGNOSIS — I10 Essential (primary) hypertension: Secondary | ICD-10-CM | POA: Diagnosis not present

## 2021-11-27 LAB — BASIC METABOLIC PANEL
Anion gap: 11 (ref 5–15)
BUN: 19 mg/dL (ref 8–23)
CO2: 23 mmol/L (ref 22–32)
Calcium: 8.6 mg/dL — ABNORMAL LOW (ref 8.9–10.3)
Chloride: 104 mmol/L (ref 98–111)
Creatinine, Ser: 0.86 mg/dL (ref 0.44–1.00)
GFR, Estimated: >60 mL/min (ref >60)
Glucose, Bld: 144 mg/dL — ABNORMAL HIGH (ref 70–99)
Potassium: 4.1 mmol/L (ref 3.5–5.1)
Sodium: 138 mmol/L (ref 135–145)

## 2021-11-27 LAB — CBC
HCT: 30.3 % — ABNORMAL LOW (ref 36.0–46.0)
Hemoglobin: 9.6 g/dL — ABNORMAL LOW (ref 12.0–15.0)
MCH: 30.4 pg (ref 26.0–34.0)
MCHC: 31.7 g/dL (ref 30.0–36.0)
MCV: 95.9 fL (ref 80.0–100.0)
Platelets: 256 10*3/uL (ref 150–400)
RBC: 3.16 MIL/uL — ABNORMAL LOW (ref 3.87–5.11)
RDW: 13 % (ref 11.5–15.5)
WBC: 12.9 10*3/uL — ABNORMAL HIGH (ref 4.0–10.5)
nRBC: 0 % (ref 0.0–0.2)

## 2021-11-27 LAB — PHOSPHORUS: Phosphorus: 3.7 mg/dL (ref 2.5–4.6)

## 2021-11-27 LAB — MAGNESIUM: Magnesium: 2 mg/dL (ref 1.7–2.4)

## 2021-11-27 MED ORDER — METHOCARBAMOL 500 MG PO TABS: 500.0000 mg | ORAL_TABLET | Freq: Four times a day (QID) | ORAL | 1 refills | Status: DC | PRN

## 2021-11-27 MED ORDER — OXYCODONE HCL 5 MG PO TABS
5.0000 mg | ORAL_TABLET | Freq: Four times a day (QID) | ORAL | 0 refills | Status: DC | PRN
Start: 2021-11-27 — End: 2022-04-29

## 2021-11-27 NOTE — Discharge Summary (Signed)
Physician Discharge Summary  Patient ID: Alisha [name] MRN: [id number] DOB/AGE: [birth year] 87 y.o.  Admit date: 11/26/2021 Discharge date: 11/27/2021  Admission Diagnoses: Patient Active Problem List   Diagnosis Date Noted   Breast cancer of upper-outer quadrant of left female breast ([place]) 11/26/2021   Malignant neoplasm of upper-inner quadrant of left breast in female, estrogen receptor negative ([place]) 11/06/2021   Atrial bigeminy 09/09/2021   Glaucoma 09/09/2021   Hardening of the aorta (main artery of the heart) ([place]) 09/09/2021   Hyperglycemia 09/09/2021   Hyperlipidemia 09/09/2021   Hypertensive retinopathy 09/09/2021   Hypothyroidism 09/09/2021   Macular degeneration 09/09/2021   Osteoarthritis 09/09/2021   Personal history of malignant neoplasm of breast 09/09/2021   Postmenopausal hormone replacement therapy 09/09/2021   Pruritus 09/09/2021   Type 2 diabetes mellitus with other specified complication ([place]) 53/97/6734   Vitamin D deficiency 09/09/2021   Anemia, normocytic normochromic 07/22/2021   Asthma 01/24/2021   Allergic rhinitis 01/24/2021   Malignant neoplasm of upper lobe of right lung ([place]) 01/23/2021   Bowel incontinence 11/22/2020   Constipation 11/22/2020   History of colonic polyps 11/22/2020   Internal hemorrhoids 11/22/2020   Obesity 11/22/2020   Vomiting without nausea 19/37/9024   Diastolic dysfunction with heart failure ([place]) 06/16/2020   Essential hypertension 03/12/2020   Preoperative cardiovascular examination 07/20/2019   Malignant neoplasm of upper-outer quadrant of left breast in female, estrogen receptor negative ([place]) 06/29/2019   Fatigue 04/21/2019   Bradycardia with 41-50 beats per minute 04/21/2019   DOE (dyspnea on exertion) 04/21/2019   Midline cystocele 05/08/2017   Arthritis of knee 02/20/2012     Discharge Diagnoses:  Principal Problem:   Breast cancer of upper-outer quadrant of left female breast [facility]) And same as  above  Discharged Condition: stable  Hospital Course:  Pt was admitted to the floor following left mastectomy and sentinel node biopsy 11/26/2021. She did very well overnight using no pain medication post op.  She was ambulatory and tolerating a diet.  She demonstrated drain understanding.  She was discharged to home in stable condition.    Consults: None  Significant Diagnostic Studies: labs: HCT 30.3 prior to d/c  Treatments: surgery: see above  Discharge Exam: Blood pressure (!) 153/48, pulse (!) 54, temperature 98.1 F (36.7 C), temperature source Oral, resp. rate 18, height 5' 4.5" (1.638 m), weight 89.7 kg, SpO2 99 %. General appearance: alert, cooperative, and no distress Resp: breathing comfortably Chest wall: anticipated mild left sided tenderness Extremities: extremities normal, atraumatic, no cyanosis or edema  Disposition: Discharge disposition: 01-Home or Self Care       Discharge Instructions     Call MD for:  difficulty breathing, headache or visual disturbances   Complete by: As directed    Call MD for:  hives   Complete by: As directed    Call MD for:  persistant nausea and vomiting   Complete by: As directed    Call MD for:  redness, tenderness, or signs of infection (pain, swelling, redness, odor or green/yellow discharge around incision site)   Complete by: As directed    Call MD for:  severe uncontrolled pain   Complete by: As directed    Call MD for:  temperature >100.4   Complete by: As directed    Change dressing (specify)   Complete by: As directed    Measure and record drain output twice daily.  Bring record to clinic.   Diet - low sodium heart healthy   Complete  by: As directed    Increase activity slowly   Complete by: As directed       Allergies as of 11/27/2021       Reactions   Doxepin Hcl    Other reaction(s): Body swelling   Estradiol    Other reaction(s): itching   Gabapentin    Other reaction(s): swelling   Levofloxacin     Other reaction(s): Unknown   Lisinopril Itching   No energy Other reaction(s): itching, hair loss   Nebivolol Hcl    Other reaction(s): fatigue   Olmesartan    Other reaction(s): hair loss, nail changes   Tramadol Hcl    Other reaction(s): abdominal pain   Penicillins Itching, Rash   Has patient had a PCN reaction causing immediate rash, facial/tongue/throat swelling, SOB or lightheadedness with hypotension: No Has patient had a PCN reaction causing severe rash involving mucus membranes or skin necrosis: No Has patient had a PCN reaction that required hospitalization: No Has patient had a PCN reaction occurring within the last 10 years: No If all of the above answers are "NO", then may proceed with Cephalosporin use.   Sulfa Antibiotics Itching, Rash        Medication List     TAKE these medications    acetaminophen 500 MG tablet Commonly known as: TYLENOL Take 1,000 mg by mouth every 6 (six) hours as needed (pain).   albuterol 108 (90 Base) MCG/ACT inhaler Commonly known as: VENTOLIN HFA Inhale 1-2 puffs into the lungs every 6 (six) hours as needed for wheezing or shortness of breath.   amLODipine 10 MG tablet Commonly known as: NORVASC Take 1 tablet (10 mg total) by mouth daily.   Biotin 10000 MCG Tabs Take by mouth daily at 12 noon.   cetaphil cream Apply 1 application topically as needed (dry skin).   chlorthalidone 25 MG tablet Commonly known as: HYGROTON TAKE ONE TABLET BY MOUTH AT LUNCH   clindamycin 300 MG capsule Commonly known as: CLEOCIN Take 600 mg by mouth See admin instructions. Take before dental appointments   fluticasone 50 MCG/ACT nasal spray Commonly known as: FLONASE Place 2 sprays into both nostrils daily as needed for allergies.   furosemide 20 MG tablet Commonly known as: LASIX Take 20 mg by mouth daily as needed for fluid or edema.   levothyroxine 88 MCG tablet Commonly known as: SYNTHROID Take 88 mcg by mouth daily before  breakfast.   meclizine 12.5 MG tablet Commonly known as: ANTIVERT Take 12.5 mg by mouth 3 (three) times daily as needed for dizziness.   methocarbamol 500 MG tablet Commonly known as: ROBAXIN Take 1 tablet (500 mg total) by mouth every 6 (six) hours as needed for muscle spasms.   Moderna COVID-19 Vaccine 100 MCG/0.5ML injection Generic drug: COVID-19 mRNA vaccine (Moderna) Inject into the muscle.   oxyCODONE 5 MG immediate release tablet Commonly known as: Oxy IR/ROXICODONE Take 1-2 tablets (5-10 mg total) by mouth every 6 (six) hours as needed for moderate pain.   potassium chloride SA 20 MEQ tablet Commonly known as: KLOR-CON M Take 20 mEq by mouth daily as needed (when taking furosemide).   PreserVision AREDS 2+Multi Vit Caps Take 1 capsule by mouth in the morning and at bedtime.   SOOTHE XP OP Place 1 drop into both eyes 2 (two) times daily as needed (dry eyes).   valsartan 320 MG tablet Commonly known as: DIOVAN Take 1 tablet (320 mg total) by mouth daily.  Discharge Care Instructions  (From admission, onward)           Start     Ordered   11/27/21 0000  Change dressing (specify)       Comments: Measure and record drain output twice daily.  Bring record to clinic.   11/27/21 0946            Follow-up Information     Stark Klein, MD Follow up in 2 week(s).   Specialty: General Surgery Contact information: [address] [place] [address] [phone]                 Signed: Stark Klein 11/27/2021, 9:46 AM

## 2021-11-28 LAB — SURGICAL PATHOLOGY

## 2021-12-02 ENCOUNTER — Encounter: Payer: Self-pay | Admitting: *Deleted

## 2021-12-02 DIAGNOSIS — R739 Hyperglycemia, unspecified: Secondary | ICD-10-CM | POA: Diagnosis not present

## 2021-12-02 DIAGNOSIS — R42 Dizziness and giddiness: Secondary | ICD-10-CM | POA: Diagnosis not present

## 2021-12-02 DIAGNOSIS — C3491 Malignant neoplasm of unspecified part of right bronchus or lung: Secondary | ICD-10-CM | POA: Diagnosis not present

## 2021-12-02 DIAGNOSIS — C50919 Malignant neoplasm of unspecified site of unspecified female breast: Secondary | ICD-10-CM | POA: Diagnosis not present

## 2021-12-02 DIAGNOSIS — E1169 Type 2 diabetes mellitus with other specified complication: Secondary | ICD-10-CM | POA: Diagnosis not present

## 2021-12-02 DIAGNOSIS — E039 Hypothyroidism, unspecified: Secondary | ICD-10-CM | POA: Diagnosis not present

## 2021-12-02 DIAGNOSIS — E785 Hyperlipidemia, unspecified: Secondary | ICD-10-CM | POA: Diagnosis not present

## 2021-12-02 DIAGNOSIS — I700 Atherosclerosis of aorta: Secondary | ICD-10-CM | POA: Diagnosis not present

## 2021-12-02 DIAGNOSIS — I10 Essential (primary) hypertension: Secondary | ICD-10-CM | POA: Diagnosis not present

## 2021-12-09 ENCOUNTER — Inpatient Hospital Stay: Payer: Medicare HMO | Attending: Hematology and Oncology | Admitting: Hematology and Oncology

## 2021-12-09 ENCOUNTER — Encounter: Payer: Self-pay | Admitting: Hematology and Oncology

## 2021-12-09 ENCOUNTER — Other Ambulatory Visit: Payer: Self-pay

## 2021-12-09 VITALS — BP 172/60 | HR 56 | Temp 98.1°F | Resp 16 | Ht 64.0 in | Wt 195.4 lb

## 2021-12-09 DIAGNOSIS — C50212 Malignant neoplasm of upper-inner quadrant of left female breast: Secondary | ICD-10-CM | POA: Insufficient documentation

## 2021-12-09 DIAGNOSIS — C50412 Malignant neoplasm of upper-outer quadrant of left female breast: Secondary | ICD-10-CM | POA: Diagnosis not present

## 2021-12-09 DIAGNOSIS — Z171 Estrogen receptor negative status [ER-]: Secondary | ICD-10-CM | POA: Insufficient documentation

## 2021-12-09 DIAGNOSIS — Z90710 Acquired absence of both cervix and uterus: Secondary | ICD-10-CM | POA: Diagnosis not present

## 2021-12-09 DIAGNOSIS — C3411 Malignant neoplasm of upper lobe, right bronchus or lung: Secondary | ICD-10-CM | POA: Insufficient documentation

## 2021-12-09 DIAGNOSIS — I10 Essential (primary) hypertension: Secondary | ICD-10-CM | POA: Diagnosis not present

## 2021-12-09 NOTE — Progress Notes (Signed)
[place]  Telephone:(336) [phone] Fax:(336) [phone]     ID: Alisha [name] DOB: Jun 09, 1935  MR#: [id number]  UYQ#:[id number]  Patient Care Team: [name], MD as PCP - General (Family Medicine) Eppie Gibson, MD as Attending Physician (Radiation Oncology) Leonie Man, MD as Consulting Physician (Cardiology) Collene Gobble, MD as Consulting Physician (Pulmonary Disease) Benay Pike, MD as Consulting Physician (Hematology and Oncology) Stark Klein, MD as Consulting Physician (General Surgery) Benay Pike, MD  CHIEF COMPLAINT: triple negative breast cancer; squamous cell lung cancer  CURRENT TREATMENT: Observation   INTERVAL HISTORY:  Alisha [name] returns today for follow up of her triple negative breast cancer.  Since last visit, she had left mastectomy.  She is recovering very well, has an indwelling drain.  Pain is not bothersome at all, she did not have to take any pain medication.  She has a follow-up with Dr. Barry Dienes.  Rest of the pertinent 10 point ROS reviewed and negative.  REVIEW OF SYSTEMS:    COVID 19 VACCINATION STATUS: infection 11/2019; Pfizer x4, most recently 03/2021   Oncology History  Alisha [name] was noted to have a left breast upper-outer quadrant distortion on screening mammogram in 2017. She proceeded to biopsy ([id number]), which showed a complex sclerosing lesion. She opted to undergo left lumpectomy ([id number]) on 08/11/2016 under Dr. Brantley Stage, which revealed a radial scar with calcifications.   She presented for routine screening mammogram, which showed a possible mass and adjacent calcifications in the left breast. She was referred to [facility] for further imaging on 06/16/2019. Physical exam performed that day showed a palpable soft thickening in the upper central aspect of the left breast. She underwent left diagnostic mammography with tomography and ultrasonography showing: breast density category B;  suspicious 7 mm mass in the 12 o'clock location of the left breast; small indeterminate group of calcifications 4 mm anterior to the mass warrant excision if the mass is positive for malignancy; left axilla negative for adenopathy.  Accordingly on 06/22/2019 she proceeded to biopsy of the left breast area in question. The pathology from this procedure ([id number]) showed: invasive ductal carcinoma, grade 3. Prognostic indicators significant for: estrogen receptor, 0% negative and progesterone receptor, 0% negative. Proliferation marker Ki67 at 30%. HER2 negative by immunohistochemistry (0).  (1) status post left lumpectomy and sentinel lymph node sampling 07/27/2019 for a pT1b pN0, stage IB invasive ductal carcinoma, with clear margins. (a) 2 left axillary lymph nodes were removed No adjuvant chemotherapy planned  Adjuvant radiation 09/07/2019 through 10/06/2019 Site Technique Total Dose (Gy) Dose per Fx (Gy) Completed Fx Beam Energies  Breast, Left: Breast_Lt 3D 40.05/40.05 2.67 15/15 6X, 10X  Breast, Left: Breast_Lt_Bst specialPort 10/10 2 5/5 15E    LUNG CANCER CT of the chest 11/14/2020 showed a new (as compared to July 2020) right upper lobe nodule measuring 0.9 cm, with no other findings of concern  (a) PET scan 12/07/2020 find the right upper lobe nodule in question to be hypermetabolic, with no other hypermetabolic areas elsewhere in the chest or abdomen  (b) CA 27-29 and CEA obtained 12/20/2020 were in the normal range  (c) bronchoscopic biopsy 12/27/2020 read as consistent with squamous cell carcinoma  Radiation to lung with curative intent 02/13/2021 through 02/20/2021 Site Technique Total Dose (Gy) Dose per Fx (Gy) Completed Fx Beam Energies  Lung, Right: Lung_Rt_RUL IMRT 54/54 18 3/3 6XFFF    10/18/2021  Left mammogram showed a 1.0 cm mass in the left breast at 10:00, no  evidence of left axillary lymphadenopathy Biopsy from this mass showed invasive ductal carcinoma grade 2 of 3,  prognostic markers show ER 0% negative, PR 0% negative, KI of 20% and HER2 negative.  11/26/2021 patient underwent left mastectomy, final pathology showed invasive ductal carcinoma, 2 cm, grade, resection margins are -3 sentinel lymph nodes negative for carcinoma, prognostics not repeated.  Initial prognostics ER 0% negative, PR 0% negative, HER2 negative, Ki-67 of 20%.  PAST MEDICAL HISTORY: Past Medical History:  Diagnosis Date   Arthritis    Asthma    Never smoker   Blood transfusion    over 40 y ears   Breast mass, left    Cancer [facility])    History of radiation therapy 09/07/19- 10/06/19   Left Breast 15 fractions of 2.67 Gy to total 40.05 Gy. Left breast boost 5 fractions of 2 Gy each to total 10 Gy   Hypertension    on meds   Hypothyroidism    on meds   Personal history of radiation therapy    Pre-diabetes     PAST SURGICAL HISTORY: Past Surgical History:  Procedure Laterality Date   ABDOMINAL HYSTERECTOMY     32 yrs   AXILLARY SENTINEL NODE BIOPSY Left 11/26/2021   Procedure: LEFT AXILLARY SENTINEL NODE BIOPSY;  Surgeon: Stark Klein, MD;  Location: [place];  Service: General;  Laterality: Left;   BREAST LUMPECTOMY Left 08/11/2016   BREAST LUMPECTOMY Left 07/27/2019   BREAST LUMPECTOMY WITH RADIOACTIVE SEED AND SENTINEL LYMPH NODE BIOPSY Left 07/27/2019   Procedure: LEFT BREAST LUMPECTOMY WITH RADIOACTIVE SEED AND LEFT SENTINEL LYMPH NODE MAPPING;  Surgeon: Erroll Luna, MD;  Location: [place];  Service: General;  Laterality: Left;   BREAST LUMPECTOMY WITH RADIOACTIVE SEED LOCALIZATION Left 08/11/2016   Procedure: LEFT BREAST LUMPECTOMY WITH RADIOACTIVE SEED LOCALIZATION;  Surgeon: Erroll Luna, MD;  Location: [place];  Service: General;  Laterality: Left;   BRONCHIAL BIOPSY  12/27/2020   Procedure: BRONCHIAL BIOPSIES;  Surgeon: Collene Gobble, MD;  Location: [facility];  Service: Pulmonary;;   BRONCHIAL BRUSHINGS  12/27/2020   Procedure: BRONCHIAL  BRUSHINGS;  Surgeon: Collene Gobble, MD;  Location: [facility] ENDOSCOPY;  Service: Pulmonary;;   BRONCHIAL NEEDLE ASPIRATION BIOPSY  12/27/2020   Procedure: BRONCHIAL NEEDLE ASPIRATION BIOPSIES;  Surgeon: Collene Gobble, MD;  Location: [place];  Service: Pulmonary;;   BRONCHIAL WASHINGS  12/27/2020   Procedure: BRONCHIAL WASHINGS;  Surgeon: Collene Gobble, MD;  Location: [place] ENDOSCOPY;  Service: Pulmonary;;   CATARACT EXTRACTION, BILATERAL     CORONARY CALCIUM SCORE-CT ANGIOGRAM  04/2019   Small, < 68mm nodule noted.  Difficult study to read.  Coronary calcium score was 11.  Significant motion artifact, but visible territory showed minimal disease.  Minimal calcium noted in the ostial LAD but no suggestion of significant CAD.   EYE SURGERY     FIDUCIAL MARKER PLACEMENT  12/27/2020   Procedure: FIDUCIAL MARKER PLACEMENT;  Surgeon: Collene Gobble, MD;  Location: [facility] ENDOSCOPY;  Service: Pulmonary;;   JOINT REPLACEMENT Bilateral    left knee 2012   KNEE ARTHROPLASTY  02/17/2012   Procedure: COMPUTER ASSISTED TOTAL KNEE ARTHROPLASTY;  Surgeon: Meredith Pel, MD;  Location: [place];  Service: Orthopedics;  Laterality: Right;  Right total knee replacement   MASTECTOMY W/ SENTINEL NODE BIOPSY Left 11/26/2021   Procedure: LEFT BREAST MASTECTOMY;  Surgeon: Stark Klein, MD;  Location: [place];  Service: General;  Laterality: Left;   TRANSTHORACIC ECHOCARDIOGRAM  04/02/2020  Normal LV function.  No R WMA.  Moderate LVH.  GRII DD with elevated filling pressures.  Moderate LA dilation with trivial MR.  Mild aortic valve sclerosis.   VIDEO BRONCHOSCOPY WITH ENDOBRONCHIAL NAVIGATION N/A 12/27/2020   Procedure: VIDEO BRONCHOSCOPY WITH ENDOBRONCHIAL NAVIGATION;  Surgeon: Collene Gobble, MD;  Location: [place] ENDOSCOPY;  Service: Pulmonary;  Laterality: N/A;    FAMILY HISTORY: Family History  Problem Relation Age of Onset   Other Mother        died @ 63 - but was healthy   Hypertension Sister    Diabetes  Sister    Diabetes Brother        no longer on medications.    Anesthesia problems Neg Hx    Hypotension Neg Hx    Malignant hyperthermia Neg Hx    Pseudochol deficiency Neg Hx   Patient's father was in his mid 49s years old when he died from unknown causes. Patient's mother died from "natural causes" at age 51. The patient denies a family hx of breast or ovarian cancer. She has 5 brothers and 1 sister.   GYNECOLOGIC HISTORY:  No LMP recorded. Patient is postmenopausal. 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  She is now retired.  She describes herself a single.  Her daughter Alisha [name] lives in [place].  She has multiple medical problems.  The patient's son died at age 71.  The patient has 1 grandchild, Alisha [name], who is buying a home 5 minutes from the patient t and works for [employer].  He has 4 children, 2 have gone through college, one is "in between" and the other 1 is currently in college.  The patient herself attends a local [employer] DIRECTIVES: The patient intends to name her grandson Alisha [name] as her healthcare power of attorney.  He can be reached at [phone].  She was given the appropriate documents to complete and notarize at her discretion at the time of her 07/21/2019 visit   HEALTH MAINTENANCE: Social History   Tobacco Use   Smoking status: Never   Smokeless tobacco: Never  Vaping Use   Vaping Use: Never used  Substance Use Topics   Alcohol use: No   Drug use: No     Allergies  Allergen Reactions   Doxepin Hcl     Other reaction(s): Body swelling   Estradiol     Other reaction(s): itching   Gabapentin     Other reaction(s):  swelling   Levofloxacin     Other reaction(s): Unknown   Lisinopril Itching    No energy Other reaction(s): itching, hair loss   Nebivolol Hcl     Other reaction(s): fatigue   Olmesartan     Other reaction(s): hair loss, nail changes   Tramadol Hcl     Other reaction(s): abdominal pain   Penicillins Itching and Rash    Has patient had a PCN reaction causing immediate rash, facial/tongue/throat swelling, SOB or lightheadedness with hypotension: No Has patient had a PCN reaction causing severe rash involving mucus membranes or skin necrosis: No Has patient had a PCN reaction that required hospitalization: No Has patient had a PCN reaction occurring within the last 10 years: No  If all of the above answers are "NO", then may proceed with Cephalosporin use.   Sulfa Antibiotics Itching and Rash    Current Outpatient Medications  Medication Sig Dispense Refill   acetaminophen (TYLENOL) 500 MG tablet Take 1,000 mg by mouth every 6 (six) hours as needed (pain).     albuterol (PROVENTIL HFA;VENTOLIN HFA) 108 (90 Base) MCG/ACT inhaler Inhale 1-2 puffs into the lungs every 6 (six) hours as needed for wheezing or shortness of breath.      amLODipine (NORVASC) 10 MG tablet Take 1 tablet (10 mg total) by mouth daily. 90 tablet 3   Artificial Tear Solution (SOOTHE XP OP) Place 1 drop into both eyes 2 (two) times daily as needed (dry eyes).     Biotin 47346 MCG TABS Take by mouth daily at 12 noon.     cetaphil (CETAPHIL) cream Apply 1 application topically as needed (dry skin).     chlorthalidone (HYGROTON) 25 MG tablet TAKE ONE TABLET BY MOUTH AT LUNCH 90 tablet 1   clindamycin (CLEOCIN) 300 MG capsule Take 600 mg by mouth See admin instructions. Take before dental appointments     COVID-19 mRNA vaccine, Moderna, 100 MCG/0.5ML injection Inject into the muscle. 0.5 mL 0   fluticasone (FLONASE) 50 MCG/ACT nasal spray Place 2 sprays into both nostrils daily as needed for allergies.     furosemide  (LASIX) 20 MG tablet Take 20 mg by mouth daily as needed for fluid or edema.     levothyroxine (SYNTHROID, LEVOTHROID) 88 MCG tablet Take 88 mcg by mouth daily before breakfast.      meclizine (ANTIVERT) 12.5 MG tablet Take 12.5 mg by mouth 3 (three) times daily as needed for dizziness.     methocarbamol (ROBAXIN) 500 MG tablet Take 1 tablet (500 mg total) by mouth every 6 (six) hours as needed for muscle spasms. 10 tablet 1   Multiple Vitamins-Minerals (PRESERVISION AREDS 2+MULTI VIT) CAPS Take 1 capsule by mouth in the morning and at bedtime.     oxyCODONE (OXY IR/ROXICODONE) 5 MG immediate release tablet Take 1-2 tablets (5-10 mg total) by mouth every 6 (six) hours as needed for moderate pain. 5 tablet 0   potassium chloride SA (KLOR-CON) 20 MEQ tablet Take 20 mEq by mouth daily as needed (when taking furosemide).     valsartan (DIOVAN) 320 MG tablet Take 1 tablet (320 mg total) by mouth daily. 90 tablet 3   No current facility-administered medications for this visit.    OBJECTIVE: African-American woman who appears stated age  There were no vitals filed for this visit.    There is no height or weight on file to calculate BMI.   Wt Readings from Last 3 Encounters:  11/26/21 197 lb 12 oz (89.7 kg)  11/22/21 197 lb 12.8 oz (89.7 kg)  11/11/21 196 lb 9 oz (89.2 kg)      ECOG FS:2 - Symptomatic, <50% confined to bed  General appearance: Alert oriented and in no acute distress. Breast: Left breast status postmastectomy, Steri-Strips in place and appears well without any evidence of infection.  Drain with serosanguineous fluid about 10 cc.   LAB RESULTS:  CMP     Component Value Date/Time   NA 138 11/27/2021 0321   NA 141 01/24/2021 1627   K 4.1 11/27/2021 0321   CL 104 11/27/2021 0321   CO2 23 11/27/2021 0321   GLUCOSE 144 (H) 11/27/2021 0321   BUN 19 11/27/2021 0321   BUN 25 01/24/2021 1627  CREATININE 0.86 11/27/2021 0321   CREATININE 0.78 07/21/2019 1525   CALCIUM 8.6 (L)  11/27/2021 0321   PROT 6.6 07/22/2021 1007   ALBUMIN 4.0 07/22/2021 1007   AST 16 07/22/2021 1007   AST 15 07/21/2019 1525   ALT 12 07/22/2021 1007   ALT 19 07/21/2019 1525   ALKPHOS 70 07/22/2021 1007   BILITOT 0.4 07/22/2021 1007   BILITOT 0.2 (L) 07/21/2019 1525   GFRNONAA >60 11/27/2021 0321   GFRNONAA >60 07/21/2019 1525   GFRAA 76 12/13/2020 1035   GFRAA >60 07/21/2019 1525    No results found for: TOTALPROTELP, ALBUMINELP, A1GS, A2GS, BETS, BETA2SER, GAMS, MSPIKE, SPEI  No results found for: KPAFRELGTCHN, LAMBDASER, KAPLAMBRATIO  Lab Results  Component Value Date   WBC 12.9 (H) 11/27/2021   NEUTROABS 5.8 07/22/2021   HGB 9.6 (L) 11/27/2021   HCT 30.3 (L) 11/27/2021   MCV 95.9 11/27/2021   PLT 256 11/27/2021   No results found for: LABCA2  No components found for: [id number]  No results for input(s): INR in the last 168 hours.  No results found for: LABCA2  No results found for: RFF638  No results found for: CAN125  No results found for: GYK599  Lab Results  Component Value Date   CA2729 15.8 12/20/2020    No components found for: HGQUANT  Lab Results  Component Value Date   CEA1 1.24 12/20/2020   /  CEA ([facility]In [facility])  Date Value Ref Range Status  12/20/2020 1.24 0.00 - 5.00 ng/mL Final    Comment:    (NOTE) This test was performed using Architect's Chemiluminescent Microparticle Immunoassay. Values obtained from different assay methods cannot be used interchangeably. Please note that 5-10% of patients who smoke may see CEA levels up to 6.9 ng/mL. Performed at [facility], [place] [address] [place], [place] [address]      No results found for: AFPTUMOR  No results found for: CHROMOGRNA  No results found for: HGBA, HGBA2QUANT, HGBFQUANT, HGBSQUAN (Hemoglobinopathy evaluation)   No results found for: LDH  No results found for: IRON, TIBC, IRONPCTSAT (Iron and TIBC)  No results found for:  FERRITIN  Urinalysis    Component Value Date/Time   COLORURINE YELLOW 07/02/2018 [place] 07/02/2018 1752   LABSPEC 1.021 07/02/2018 1752   PHURINE 5.0 07/02/2018 1752   GLUCOSEU NEGATIVE 07/02/2018 1752   HGBUR NEGATIVE 07/02/2018 1752   BILIRUBINUR NEGATIVE 07/02/2018 1752   KETONESUR 20 (A) 07/02/2018 1752   PROTEINUR NEGATIVE 07/02/2018 1752   UROBILINOGEN 1.0 02/20/2012 0927   NITRITE NEGATIVE 07/02/2018 1752   LEUKOCYTESUR NEGATIVE 07/02/2018 1752    STUDIES: No results found.   ELIGIBLE FOR AVAILABLE RESEARCH PROTOCOL: no  ASSESSMENT: 87 y.o. [employer], [place] with recently diagnosed left breast upper outer quadrant invasive ductal carcinoma, triple negative who is here for follow-up.  Please refer to oncological history for complete history.  Her last breast cancer back in 2020 was in the left breast at 12 o'clock position again IDC triple negative.  At that time she had surgery, adjuvant radiation, no adjuvant chemotherapy.  Since then she also had lung cancer which was treated with SBRT with excellent response.  She most recently had an abnormal mammogram of the left breast which showed a 1 cm tumor, triple negative on the initial biopsy, grade 2.  Discussion was to consider upfront surgery given her age and she is now status post left mastectomy.  PLAN:  Final pathology showed  2 cm, grade 2 tumor, prognostics not repeated, initial prognostics ER 0%, PR 0% and HER2 negative. Given triple negative pathology and tumor greater than 5 mm, we have discussed about adjuvant docetaxel and cyclophosphamide every 21 days for 4 cycles especially since she has robust performance status. We have discussed about adverse effects of chemotherapy including but not limited to fatigue, nausea, vomiting, diarrhea, increased risk of cytopenias, neuropathy, infections etc. She understands that the role of adjuvant chemotherapy is to decrease risk of recurrence. She would like to  give this a thought and return to clinic in about 3 weeks, I think this is also reasonable since this will also ensure adequate healing.   Total encounter time 30 minutes.   Benay Pike, MD   12/09/2021 9:13 AM Medical Oncology and Hematology [facility] [place], [place] [address] Tel. [phone]    Fax. [phone]  *Total Encounter Time as defined by the Centers for Medicare and Medicaid Services includes, in addition to the face-to-face time of a patient visit (documented in the note above) non-face-to-face time: obtaining and reviewing outside history, ordering and reviewing medications, tests or procedures, care coordination (communications with other health care professionals or caregivers) and documentation in the medical record.

## 2021-12-10 ENCOUNTER — Encounter: Payer: Self-pay | Admitting: *Deleted

## 2021-12-12 DIAGNOSIS — H40053 Ocular hypertension, bilateral: Secondary | ICD-10-CM | POA: Diagnosis not present

## 2021-12-12 DIAGNOSIS — E1169 Type 2 diabetes mellitus with other specified complication: Secondary | ICD-10-CM | POA: Diagnosis not present

## 2021-12-12 DIAGNOSIS — H402231 Chronic angle-closure glaucoma, bilateral, mild stage: Secondary | ICD-10-CM | POA: Diagnosis not present

## 2021-12-12 DIAGNOSIS — I10 Essential (primary) hypertension: Secondary | ICD-10-CM | POA: Diagnosis not present

## 2021-12-12 DIAGNOSIS — J45998 Other asthma: Secondary | ICD-10-CM | POA: Diagnosis not present

## 2021-12-12 DIAGNOSIS — H353132 Nonexudative age-related macular degeneration, bilateral, intermediate dry stage: Secondary | ICD-10-CM | POA: Diagnosis not present

## 2021-12-12 DIAGNOSIS — H04123 Dry eye syndrome of bilateral lacrimal glands: Secondary | ICD-10-CM | POA: Diagnosis not present

## 2021-12-20 ENCOUNTER — Ambulatory Visit: Payer: Medicare HMO | Admitting: Podiatry

## 2021-12-20 ENCOUNTER — Other Ambulatory Visit: Payer: Self-pay

## 2021-12-20 DIAGNOSIS — M79674 Pain in right toe(s): Secondary | ICD-10-CM

## 2021-12-20 DIAGNOSIS — B351 Tinea unguium: Secondary | ICD-10-CM

## 2021-12-20 DIAGNOSIS — M79675 Pain in left toe(s): Secondary | ICD-10-CM | POA: Diagnosis not present

## 2021-12-20 DIAGNOSIS — Q828 Other specified congenital malformations of skin: Secondary | ICD-10-CM

## 2021-12-20 DIAGNOSIS — G629 Polyneuropathy, unspecified: Secondary | ICD-10-CM

## 2021-12-20 DIAGNOSIS — C50919 Malignant neoplasm of unspecified site of unspecified female breast: Secondary | ICD-10-CM | POA: Insufficient documentation

## 2021-12-27 ENCOUNTER — Telehealth: Payer: Self-pay | Admitting: *Deleted

## 2021-12-27 NOTE — Telephone Encounter (Signed)
CALLED PATIENT TO INFORM OF CT FOR 01-08-22 - ARRIVAL TIME- 11 AM @ [facility], NO RESTRICTIONS TO TEST, DR. Isidore Moos TO CALL PATIENT ON 01-10-22 @ 10:20 AM WITH RESULTS, SPOKE WITH PATIENT AND SHE IS AWARE OF THESE APPTS. ?

## 2021-12-29 ENCOUNTER — Encounter: Payer: Self-pay | Admitting: Podiatry

## 2021-12-29 NOTE — Progress Notes (Signed)
?  Subjective:  ?Patient ID: Alisha [name], female    DOB: [birth year],  MRN: [id number] ? ?Alisha [name] presents to clinic today for at risk foot care with history of peripheral neuropathy and painful porokeratotic lesion(s) right foot and painful mycotic toenails that limit ambulation. Painful toenails interfere with ambulation. Aggravating factors include wearing enclosed shoe gear. Pain is relieved with periodic professional debridement. Painful porokeratotic lesions are aggravated when weightbearing with and without shoegear. Pain is relieved with periodic professional debridement. ? ?New problem(s): None.  ? ?PCP is [name], MD , and last visit was February, 2023. ? ?Allergies  ?Allergen Reactions  ? Doxepin Hcl   ?  Other reaction(s): Body swelling  ? Estradiol   ?  Other reaction(s): itching  ? Gabapentin   ?  Other reaction(s): swelling  ? Levofloxacin   ?  Other reaction(s): Unknown  ? Lisinopril Itching  ?  No energy ?Other reaction(s): itching, hair loss  ? Nebivolol Hcl   ?  Other reaction(s): fatigue  ? Olmesartan   ?  Other reaction(s): hair loss, nail changes  ? Tramadol Hcl   ?  Other reaction(s): abdominal pain  ? Penicillins Itching and Rash  ?  Has patient had a PCN reaction causing immediate rash, facial/tongue/throat swelling, SOB or lightheadedness with hypotension: No ?Has patient had a PCN reaction causing severe rash involving mucus membranes or skin necrosis: No ?Has patient had a PCN reaction that required hospitalization: No ?Has patient had a PCN reaction occurring within the last 10 years: No ?If all of the above answers are "NO", then may proceed with Cephalosporin use.  ? Sulfa Antibiotics Itching and Rash  ? ? ?Review of Systems: Negative except as noted in the HPI. ? ?Objective: ?Physical Exam ? ?General: Alisha [name] is a pleasant 87 y.o. African American female, in NAD. AAO x 3.  ? ?Vascular:  ?Capillary fill time to digits <3 seconds b/l lower  extremities. Palpable DP pulse(s) b/l lower extremities Palpable PT pulse(s) b/l lower extremities Pedal hair sparse. Lower extremity skin temperature gradient within normal limits. No pain with calf compression b/l. No edema noted b/l lower extremities. ? ?Dermatological:  ?Pedal skin with normal turgor, texture and tone b/l lower extremities. No open wounds b/l lower extremities. No interdigital macerations b/l lower extremities. Toenails 1-5 b/l elongated, discolored, dystrophic, thickened, crumbly with subungual debris and tenderness to dorsal palpation. Porokeratotic lesion right hallux with tenderness to palpation. No erythema, no edema, no drainage, no flocculence.  ? ?Musculoskeletal:  ?Normal muscle strength 5/5 to all lower extremity muscle groups bilaterally. Hammertoe(s) noted to the L hallux, L 2nd toe, and L 3rd toe. ? ?Neurological:  ?Pt has subjective symptoms of neuropathy. Protective sensation decreased with 10 gram monofilament b/l. ? ?Hemoglobin A1C Latest Ref Rng & Units 11/22/2021  ?HGBA1C 4.8 - 5.6 % 5.3  ?Some recent data might be hidden  ? ?Assessment/Plan: ?1. Pain due to onychomycosis of toenails of both feet   ?2. Porokeratosis   ?3. Neuropathy   ?-Examined patient. ?-Toenails 1-5 b/l were debrided in length and girth with sterile nail nippers and dremel without iatrogenic bleeding.  ?-Painful porokeratotic lesion(s) R hallux pared and enucleated with sterile scalpel blade without incident. Total number of lesions debrided=1. ?-Patient/POA to call should there be question/concern in the interim.  ? ?Return in about 3 months (around 03/22/2022). ? ?Marzetta Board, DPM  ?

## 2022-01-06 DIAGNOSIS — C50912 Malignant neoplasm of unspecified site of left female breast: Secondary | ICD-10-CM | POA: Diagnosis not present

## 2022-01-08 ENCOUNTER — Ambulatory Visit (HOSPITAL_COMMUNITY)
Admission: RE | Admit: 2022-01-08 | Discharge: 2022-01-08 | Disposition: A | Discharge status: Home / Self Care | Payer: Medicare HMO | Source: Ambulatory Visit | Attending: Radiation Oncology | Admitting: Radiation Oncology

## 2022-01-08 ENCOUNTER — Other Ambulatory Visit: Payer: Self-pay

## 2022-01-08 DIAGNOSIS — R918 Other nonspecific abnormal finding of lung field: Secondary | ICD-10-CM | POA: Diagnosis not present

## 2022-01-08 DIAGNOSIS — C3490 Malignant neoplasm of unspecified part of unspecified bronchus or lung: Secondary | ICD-10-CM | POA: Diagnosis not present

## 2022-01-08 DIAGNOSIS — C3491 Malignant neoplasm of unspecified part of right bronchus or lung: Secondary | ICD-10-CM | POA: Diagnosis not present

## 2022-01-10 ENCOUNTER — Ambulatory Visit
Admission: RE | Admit: 2022-01-10 | Discharge: 2022-01-10 | Disposition: A | Discharge status: Home / Self Care | Payer: Medicare HMO | Source: Ambulatory Visit | Attending: Radiation Oncology | Admitting: Radiation Oncology

## 2022-01-10 DIAGNOSIS — C3411 Malignant neoplasm of upper lobe, right bronchus or lung: Secondary | ICD-10-CM

## 2022-01-10 NOTE — Progress Notes (Signed)
The patient was scheduled for telemedicine follow-up, by phone, to review her CT chest results.  She was a no-show.  I left a voicemail today notifying her that her CT of her chest looks good.  We will schedule her for another follow-up in 4 months with repeat imaging at that time.  ?No charge for this visit. ?----------------------------------- ? ?Eppie Gibson, MD ? ?

## 2022-01-11 ENCOUNTER — Other Ambulatory Visit: Payer: Self-pay | Admitting: Cardiology

## 2022-01-13 ENCOUNTER — Inpatient Hospital Stay: Payer: Medicare HMO | Attending: Hematology and Oncology | Admitting: Hematology and Oncology

## 2022-01-13 ENCOUNTER — Encounter: Payer: Self-pay | Admitting: Hematology and Oncology

## 2022-01-13 ENCOUNTER — Other Ambulatory Visit: Payer: Self-pay

## 2022-01-13 VITALS — BP 157/68 | HR 55 | Temp 98.1°F | Resp 18 | Ht 64.0 in | Wt 194.6 lb

## 2022-01-13 DIAGNOSIS — Z90710 Acquired absence of both cervix and uterus: Secondary | ICD-10-CM | POA: Diagnosis not present

## 2022-01-13 DIAGNOSIS — I119 Hypertensive heart disease without heart failure: Secondary | ICD-10-CM | POA: Diagnosis not present

## 2022-01-13 DIAGNOSIS — C50412 Malignant neoplasm of upper-outer quadrant of left female breast: Secondary | ICD-10-CM | POA: Diagnosis not present

## 2022-01-13 DIAGNOSIS — Z171 Estrogen receptor negative status [ER-]: Secondary | ICD-10-CM | POA: Diagnosis not present

## 2022-01-13 DIAGNOSIS — C3411 Malignant neoplasm of upper lobe, right bronchus or lung: Secondary | ICD-10-CM | POA: Insufficient documentation

## 2022-01-13 DIAGNOSIS — C50212 Malignant neoplasm of upper-inner quadrant of left female breast: Secondary | ICD-10-CM | POA: Diagnosis present

## 2022-01-13 NOTE — Progress Notes (Signed)
?[place]  ?[phone] [phone]  ? ? ? ?ID: Alisha [name] DOB: 01-Dec-1934  MR#: [id number]  PPI#:[id number] ? ?Patient Care Team: ?[name], MD as PCP - General (Family Medicine) ?Eppie Gibson, MD as Attending Physician (Radiation Oncology) ?Leonie Man, MD as Consulting Physician (Cardiology) ?Collene Gobble, MD as Consulting Physician (Pulmonary Disease) ?Benay Pike, MD as Consulting Physician (Hematology and Oncology) ?Stark Klein, MD as Consulting Physician (General Surgery) ?Benay Pike, MD ? ?CHIEF COMPLAINT: triple negative breast cancer; squamous cell lung cancer ? ?CURRENT TREATMENT: Observation ? ? ?INTERVAL HISTORY: ? ?Alisha [name] returns today for follow up of her triple negative breast cancer.   ? ? ?REVIEW OF SYSTEMS: ? ? ? COVID 19 VACCINATION STATUS: infection 11/2019; Pfizer x4, most recently 03/2021 ? ? ?Oncology History ? ?Alisha [name] was noted to have a left breast upper-outer quadrant distortion on screening mammogram in 2017. Alisha [name] proceeded to biopsy ([id number]), which showed a complex sclerosing lesion. Alisha [name] opted to undergo left lumpectomy ([id number]) on 08/11/2016 under Dr. Brantley Stage, which revealed a radial scar with calcifications.  ? ?Alisha [name] presented for routine screening mammogram, which showed a possible mass and adjacent calcifications in the left breast. Alisha [name] was referred to [facility] for further imaging on 06/16/2019. Physical exam performed that day showed a palpable soft thickening in the upper central aspect of the left breast. Alisha [name] underwent left diagnostic mammography with tomography and ultrasonography showing: breast density category B; suspicious 7 mm mass in the 12 o'clock location of the left breast; small indeterminate group of calcifications 4 mm anterior to the mass warrant excision if the mass is positive for malignancy; left axilla negative for adenopathy. ? ?Accordingly on 06/22/2019 Alisha [name] proceeded  to biopsy of the left breast area in question. The pathology from this procedure ([id number]) showed: invasive ductal carcinoma, grade 3. Prognostic indicators significant for: estrogen receptor, 0% negative and progesterone receptor, 0% negative. Proliferation marker Ki67 at 30%. HER2 negative by immunohistochemistry (0).  ?(1) status post left lumpectomy and sentinel lymph node sampling 07/27/2019 for a pT1b pN0, stage IB invasive ductal carcinoma, with clear margins. (a) 2 left axillary lymph nodes were removed ?No adjuvant chemotherapy planned ? ?Adjuvant radiation 09/07/2019 through 10/06/2019 ?Site Technique Total Dose (Gy) Dose per Fx (Gy) Completed Fx Beam Energies  ?Breast, Left: Breast_Lt 3D 40.05/40.05 2.67 15/15 6X, 10X  ?Breast, Left: Breast_Lt_Bst specialPort 10/10 2 5/5 15E  ? ? ?LUNG CANCER ?CT of the chest 11/14/2020 showed a new (as compared to July 2020) right upper lobe nodule measuring 0.9 cm, with no other findings of concern ? (a) PET scan 12/07/2020 find the right upper lobe nodule in question to be hypermetabolic, with no other hypermetabolic areas elsewhere in the chest or abdomen ? (b) CA 27-29 and CEA obtained 12/20/2020 were in the normal range ? (c) bronchoscopic biopsy 12/27/2020 read as consistent with squamous cell carcinoma ? ?Radiation to lung with curative intent 02/13/2021 through 02/20/2021 ?Site Technique Total Dose (Gy) Dose per Fx (Gy) Completed Fx Beam Energies  ?Lung, Right: Lung_Rt_RUL IMRT 54/54 18 3/3 6XFFF  ? ? ?10/18/2021 ? ?Left mammogram showed a 1.0 cm mass in the left breast at 10:00, no evidence of left axillary lymphadenopathy ?Biopsy from this mass showed invasive ductal carcinoma grade 2 of 3, prognostic markers show ER 0% negative, PR 0% negative, KI of 20% and HER2 negative. ? ?11/26/2021 patient underwent left mastectomy, final pathology showed invasive ductal carcinoma, 2 cm, grade, resection margins are -  3 sentinel lymph nodes negative for carcinoma,  prognostics not repeated.  Initial prognostics ER 0% negative, PR 0% negative, HER2 negative, Ki-67 of 20%. ? ?During her last visit, we have discussed about TC adjuvant chemotherapy for 4 cycles. ? ?Interval History ? ?Alisha [name] is here for follow-up by herself.  Since her last visit, Alisha [name] has thought about the role of adjuvant chemotherapy quite a bit.  Alisha [name] states that Alisha [name] does not want to proceed with that right now because Alisha [name] was told that all the cancer has been removed and given her age and excellent performance status at baseline, Alisha [name] does not want to take chemotherapy which might make her miserable.  Alisha [name] otherwise has been doing very well Alisha [name] was curious to know her CT chest results because of her history of lung cancer.  Rest of the pertinent 10 point ROS reviewed and negative ? ?PAST MEDICAL HISTORY: ?Past Medical History:  ?Diagnosis Date  ? Arthritis   ? Asthma   ? Never smoker  ? Blood transfusion   ? over 40 y ears  ? Breast mass, left   ? Cancer [facility])   ? History of radiation therapy 09/07/19- 10/06/19  ? Left Breast 15 fractions of 2.67 Gy to total 40.05 Gy. Left breast boost 5 fractions of 2 Gy each to total 10 Gy  ? Hypertension   ? on meds  ? Hypothyroidism   ? on meds  ? Personal history of radiation therapy   ? Pre-diabetes   ? ? ?PAST SURGICAL HISTORY: ?Past Surgical History:  ?Procedure Laterality Date  ? ABDOMINAL HYSTERECTOMY    ? 30 yrs  ? AXILLARY SENTINEL NODE BIOPSY Left 11/26/2021  ? Procedure: LEFT AXILLARY SENTINEL NODE BIOPSY;  Surgeon: Stark Klein, MD;  Location: [place];  Service: General;  Laterality: Left;  ? BREAST LUMPECTOMY Left 08/11/2016  ? BREAST LUMPECTOMY Left 07/27/2019  ? BREAST LUMPECTOMY WITH RADIOACTIVE SEED AND SENTINEL LYMPH NODE BIOPSY Left 07/27/2019  ? Procedure: LEFT BREAST LUMPECTOMY WITH RADIOACTIVE SEED AND LEFT SENTINEL LYMPH NODE MAPPING;  Surgeon: Erroll Luna, MD;  Location: [place];  Service: General;  Laterality: Left;  ? BREAST LUMPECTOMY WITH RADIOACTIVE  SEED LOCALIZATION Left 08/11/2016  ? Procedure: LEFT BREAST LUMPECTOMY WITH RADIOACTIVE SEED LOCALIZATION;  Surgeon: Erroll Luna, MD;  Location: [place];  Service: General;  Laterality: Left;  ? BRONCHIAL BIOPSY  12/27/2020  ? Procedure: BRONCHIAL BIOPSIES;  Surgeon: Collene Gobble, MD;  Location: [facility] ENDOSCOPY;  Service: Pulmonary;;  ? BRONCHIAL BRUSHINGS  12/27/2020  ? Procedure: BRONCHIAL BRUSHINGS;  Surgeon: Collene Gobble, MD;  Location: [facility] ENDOSCOPY;  Service: Pulmonary;;  ? BRONCHIAL NEEDLE ASPIRATION BIOPSY  12/27/2020  ? Procedure: BRONCHIAL NEEDLE ASPIRATION BIOPSIES;  Surgeon: Collene Gobble, MD;  Location: [facility] ENDOSCOPY;  Service: Pulmonary;;  ? BRONCHIAL WASHINGS  12/27/2020  ? Procedure: BRONCHIAL WASHINGS;  Surgeon: Collene Gobble, MD;  Location: [facility] ENDOSCOPY;  Service: Pulmonary;;  ? CATARACT EXTRACTION, BILATERAL    ? CORONARY CALCIUM SCORE-CT ANGIOGRAM  04/2019  ? Small, < 69mm nodule noted.  Difficult study to read.  Coronary calcium score was 11.  Significant motion artifact, but visible territory showed minimal disease.  Minimal calcium noted in the ostial LAD but no suggestion of significant CAD.  ? EYE SURGERY    ? FIDUCIAL MARKER PLACEMENT  12/27/2020  ? Procedure: FIDUCIAL MARKER PLACEMENT;  Surgeon: Collene Gobble, MD;  Location: [facility] ENDOSCOPY;  Service: Pulmonary;;  ? JOINT REPLACEMENT Bilateral   ?  left knee 2012  ? KNEE ARTHROPLASTY  02/17/2012  ? Procedure: COMPUTER ASSISTED TOTAL KNEE ARTHROPLASTY;  Surgeon: Meredith Pel, MD;  Location: [place];  Service: Orthopedics;  Laterality: Right;  Right total knee replacement  ? MASTECTOMY W/ SENTINEL NODE BIOPSY Left 11/26/2021  ? Procedure: LEFT BREAST MASTECTOMY;  Surgeon: Stark Klein, MD;  Location: [place];  Service: General;  Laterality: Left;  ? TRANSTHORACIC ECHOCARDIOGRAM  04/02/2020  ? Normal LV function.  No R WMA.  Moderate LVH.  GRII DD with elevated filling pressures.  Moderate LA dilation with trivial MR.   Mild aortic valve sclerosis.  ? VIDEO BRONCHOSCOPY WITH ENDOBRONCHIAL NAVIGATION N/A 12/27/2020  ? Procedure: VIDEO BRONCHOSCOPY WITH ENDOBRONCHIAL NAVIGATION;  Surgeon: Collene Gobble, MD;  Location: [place]

## 2022-01-14 ENCOUNTER — Encounter: Payer: Self-pay | Admitting: Hematology and Oncology

## 2022-01-14 ENCOUNTER — Telehealth: Payer: Self-pay | Admitting: Hematology and Oncology

## 2022-01-14 ENCOUNTER — Encounter: Payer: Self-pay | Admitting: *Deleted

## 2022-01-14 ENCOUNTER — Telehealth: Payer: Self-pay

## 2022-01-14 ENCOUNTER — Other Ambulatory Visit: Payer: Self-pay

## 2022-01-14 DIAGNOSIS — C3411 Malignant neoplasm of upper lobe, right bronchus or lung: Secondary | ICD-10-CM

## 2022-01-14 DIAGNOSIS — C50912 Malignant neoplasm of unspecified site of left female breast: Secondary | ICD-10-CM | POA: Diagnosis not present

## 2022-01-14 NOTE — Telephone Encounter (Signed)
Called left message on patient answering machine-  patient has not had an appointment since 2021. ? Need an  appointment before  future  refill are given . ? Once  appt is obtain  refill can be given until appt, then a yearly will be done. ? ?

## 2022-01-14 NOTE — Telephone Encounter (Signed)
Called patient and left VM relaying good scan results per Dr. Isidore Moos. Informed patient that Dr. Isidore Moos ordered a repeat scan in 4 months with an in-person F/U after to review results. Told her our scheduler Enid Derry would call her to make both appointments once scan had been authorized. Provided my direct call back number should she have any questions/concerns ?

## 2022-01-14 NOTE — Telephone Encounter (Signed)
-----   Message from Eppie Gibson, MD sent at 01/10/2022 10:29 AM EDT ----- ?She did not answer phone but I left VM. ?Please reiterate that scan looks great if she calls back and please schedule repeat CT chest no contrast in 62mo w/ f/u in person at that time. ? ?Thanks! ?SS ? ? ?

## 2022-01-14 NOTE — Telephone Encounter (Signed)
Scheduled appointment per 3/27 los left message ?

## 2022-01-14 NOTE — Progress Notes (Signed)
Patient called regarding applying for [employer]. ? ?Introduced myself as Arboriculturist and provided grant details. ? ?Patient currently does not qualify because she is not in active treatment and states that will be on hold for now. ? ?She has my card should anything change and she become eligible. She verbalized understanding. ?

## 2022-01-15 DIAGNOSIS — E785 Hyperlipidemia, unspecified: Secondary | ICD-10-CM | POA: Diagnosis not present

## 2022-01-15 DIAGNOSIS — E039 Hypothyroidism, unspecified: Secondary | ICD-10-CM | POA: Diagnosis not present

## 2022-01-15 DIAGNOSIS — I10 Essential (primary) hypertension: Secondary | ICD-10-CM | POA: Diagnosis not present

## 2022-01-17 DIAGNOSIS — I10 Essential (primary) hypertension: Secondary | ICD-10-CM | POA: Diagnosis not present

## 2022-01-29 DIAGNOSIS — C50912 Malignant neoplasm of unspecified site of left female breast: Secondary | ICD-10-CM | POA: Diagnosis not present

## 2022-02-12 ENCOUNTER — Ambulatory Visit: Payer: Medicare HMO | Admitting: Hematology and Oncology

## 2022-02-12 ENCOUNTER — Other Ambulatory Visit: Payer: Medicare HMO

## 2022-02-13 DIAGNOSIS — J45909 Unspecified asthma, uncomplicated: Secondary | ICD-10-CM | POA: Diagnosis not present

## 2022-02-13 DIAGNOSIS — E785 Hyperlipidemia, unspecified: Secondary | ICD-10-CM | POA: Diagnosis not present

## 2022-02-13 DIAGNOSIS — E039 Hypothyroidism, unspecified: Secondary | ICD-10-CM | POA: Diagnosis not present

## 2022-02-13 DIAGNOSIS — I10 Essential (primary) hypertension: Secondary | ICD-10-CM | POA: Diagnosis not present

## 2022-02-13 DIAGNOSIS — E1169 Type 2 diabetes mellitus with other specified complication: Secondary | ICD-10-CM | POA: Diagnosis not present

## 2022-02-16 DIAGNOSIS — I10 Essential (primary) hypertension: Secondary | ICD-10-CM | POA: Diagnosis not present

## 2022-03-07 ENCOUNTER — Encounter: Payer: Self-pay | Admitting: Emergency Medicine

## 2022-03-07 ENCOUNTER — Ambulatory Visit: Payer: Medicare HMO | Admitting: Emergency Medicine

## 2022-03-07 DIAGNOSIS — J4520 Mild intermittent asthma, uncomplicated: Secondary | ICD-10-CM | POA: Diagnosis not present

## 2022-03-07 DIAGNOSIS — C3411 Malignant neoplasm of upper lobe, right bronchus or lung: Secondary | ICD-10-CM | POA: Diagnosis not present

## 2022-03-07 DIAGNOSIS — J301 Allergic rhinitis due to pollen: Secondary | ICD-10-CM

## 2022-03-07 NOTE — Assessment & Plan Note (Signed)
Treated with SBRT and stable fibrotic change on her most recent CT chest 01/08/2022.  Subsequent imaging as per oncology plans.  She is being followed for this as well as her breast cancer.  If any further biopsy is ever indicated then I am happy to arrange.

## 2022-03-07 NOTE — Patient Instructions (Signed)
Please continue to follow with oncology and get your repeat imaging per their plans.  If it anytime we feel that you need further evaluation for any abnormalities in the lungs then we will follow-up and plan biopsy if necessary.  Your most recent CT scan showed stable scar in the right upper lobe where you were treated with radiation. Keep your albuterol available to use 2 puffs if needed for shortness of breath, chest tightness, wheezing. Use your Flonase nasal spray, 2 sprays each nostril if you need it for congestion and allergy symptoms. Follow Dr. Lamonte Sakai in 1 year or sooner if you have any problems.

## 2022-03-07 NOTE — Assessment & Plan Note (Signed)
Continue fluticasone nasal spray if needed

## 2022-03-07 NOTE — Progress Notes (Signed)
Subjective:    Patient ID: Alisha [name], female    DOB: 1935/10/20, 87 y.o.   MRN: [id number]  HPI  ROV 01/24/21 --87 year old woman who returns today for follow-up after bronchoscopy.  She had a new 9 mm round central right upper lobe pulmonary nodule that appeared on her surveillance CT scan 11/14/2020 that was ordered to follow her breast cancer (Dr. Jana Hakim).  She also has remote asthma that does not appear to still be active.  Bronchoscopy was 12/27/2020, and needle samples were most consistent with squamous cell carcinoma following immunohistochemistry staining.  She has been seen by radiation oncology and is planning for SBRT with Dr Isidore Moos. Her breathing has been stable, no flares. She uses albuterol occasionally, usually if she is exerting during allergy season, pollen.  She is using flonase most days.   ROV 03/07/2022 --follow-up visit for 87 year old woman with history of remote asthma, breast cancer, allergic rhinitis.  She underwent bronchoscopy 12/27/2020 to evaluate a 9 mm round central right upper lobe pulmonary nodule.  Pathology was consistent with squamous cell lung cancer.  She underwent SBRT and is currently on observation for this. Unfortunately she had a breast CA recurrence and underwent mastectomy 2 months ago. She has positive nodes and is being considered for adjuvant therapy. Her PFT 02/04/21 show mainly restriction, no clear obstruction.  She is not having any asthma sx, has not had any flares. She does use albuterol 1-2x a day - she feels congestion in her throat and chest in the am and the albuterol helps w this. Minimal allergy sx, uses flonase occasionally   CT chest 01/08/2022 reviewed by me shows right upper lobe masslike fibrotic change post SBRT, 4.2 x 2.7 cm.  The nodule is no longer seen.  The fibrotic focus is smaller compared with 09/05/2021  Review of Systems As per HPI     Objective:   Physical Exam Vitals:   03/07/22 1204  BP: 128/80  Pulse: (!)  50  Temp: 99 F (37.2 C)  TempSrc: Oral  SpO2: 99%  Weight: 200 lb 6.4 oz (90.9 kg)  Height: 5' 4.5" (1.638 m)   Gen: Pleasant, overwt woman, in no distress,  normal affect  ENT: No lesions,  mouth clear,  oropharynx clear, no postnasal drip  Neck: No JVD, soft upper airway noise  Lungs: No use of accessory muscles, no crackles or wheezing on normal respiration, no wheeze on forced expiration  Cardiovascular: RRR, heart sounds normal, no murmur or gallops, no peripheral edema  Musculoskeletal: No deformities, no cyanosis or clubbing  Neuro: alert, awake, non focal  Skin: Warm, no lesions or rash     Assessment & Plan:  Malignant neoplasm of upper lobe of right lung (HCC) Treated with SBRT and stable fibrotic change on her most recent CT chest 01/08/2022.  Subsequent imaging as per oncology plans.  She is being followed for this as well as her breast cancer.  If any further biopsy is ever indicated then I am happy to arrange.  Asthma Mild intermittent asthma, sounds at least in part like upper airway disease.  She uses albuterol about once daily to clear secretions.  Plan to continue to use if needed.  No flares.  No indication for chronic ICS or BD therapy.  Allergic rhinitis Continue fluticasone nasal spray if needed  Baltazar Apo, MD, PhD 03/07/2022, 12:21 PM [facility] [phone] or if no answer before 7:00PM call [phone] For any issues after 7:00PM please call eLink  [phone]  

## 2022-03-07 NOTE — Assessment & Plan Note (Signed)
Mild intermittent asthma, sounds at least in part like upper airway disease.  She uses albuterol about once daily to clear secretions.  Plan to continue to use if needed.  No flares.  No indication for chronic ICS or BD therapy.

## 2022-03-19 DIAGNOSIS — I10 Essential (primary) hypertension: Secondary | ICD-10-CM | POA: Diagnosis not present

## 2022-03-24 ENCOUNTER — Ambulatory Visit: Payer: Medicare HMO | Admitting: Podiatry

## 2022-03-24 ENCOUNTER — Encounter: Payer: Self-pay | Admitting: Podiatry

## 2022-03-24 DIAGNOSIS — Q828 Other specified congenital malformations of skin: Secondary | ICD-10-CM | POA: Diagnosis not present

## 2022-03-24 DIAGNOSIS — M79674 Pain in right toe(s): Secondary | ICD-10-CM | POA: Diagnosis not present

## 2022-03-24 DIAGNOSIS — G629 Polyneuropathy, unspecified: Secondary | ICD-10-CM | POA: Diagnosis not present

## 2022-03-24 DIAGNOSIS — M79675 Pain in left toe(s): Secondary | ICD-10-CM

## 2022-03-24 DIAGNOSIS — B351 Tinea unguium: Secondary | ICD-10-CM

## 2022-03-24 NOTE — Patient Instructions (Signed)
Discuss Nervive Nerve Relief with your PCP and Oncologist for Neuropathy/Nerve Pain  Nerve Relief Nervive Nerve Relief helps with occasional nerve aches, discomfort, & weakness due to aging*. Results start in just 14 days with daily use*.  PRODUCT DETAILS collapse NERVIVE NERVE RELIEF FROM THE WORLD'S #1 SELLING NERVE CARE COMPANY??: With experience & expertise working globally in nerve care for 50 years  [place]*?: Alpha lipoic acid has been shown to help with occasional nerve aches & discomfort*? and B Complex Vitamins nourish nerves, enhance neurotransmission, and build nerve insulation*  RESULTS START IN JUST 14 DAYS: Natural changes in nerve health with age may lead to occasional aches, weakness, & discomfort in your hands & feet. With daily use, Nervive Nerve Relief starts working in as little as 14 days*  OVER 50% NERVE DISCOMFORT REDUCTION*?: After 4 weeks, the Alpha Lipoic Acid in Nervive is shown to reduce nerve discomfort by over 50%*?  SUGAR FREE  INGREDIENTS collapse Thiamin (as thiamin mononitrate) Vitamin B6 (as pyridoxine hydrochloride) Vitamin B12 (as cyanocobalamin) Alpha lipoic acid Nerve Relief Herbal Blend of Turmeric (curcuma longa) rhizome extract & Ginger (Zingiber officinale) root extract Other Ingredients: Microcrystalline cellulose, dicalcium phosphate, hydroxypropyl methylcellulose, croscarmellose sodium, magnesium stearate, silicon dioxide, hydroxypropyl cellulose, stearic acid, calcium silicate, FD&C Blue No. 2 [place], medium chain triglycerides oil, glycerin, flavors USAGE INSTRUCTIONS collapse Take 1 tablet every day. Results begin in 2 weeks.

## 2022-03-29 NOTE — Progress Notes (Signed)
Subjective:  Patient ID: Alisha [name], female    DOB: 12-29-1934,  MRN: [id number]  Alisha [name] presents to clinic today for at risk foot care with history of peripheral neuropathy and painful porokeratotic lesion(s) right lower extremity and painful mycotic toenails that limit ambulation. Painful toenails interfere with ambulation. Aggravating factors include wearing enclosed shoe gear. Pain is relieved with periodic professional debridement. Painful porokeratotic lesions are aggravated when weightbearing with and without shoegear. Pain is relieved with periodic professional debridement.  Patient states she is not a diabetic.  She states she has neuropathy and her gabapentin was stopped by her Oncologist. She still continues to have neuropathy symptoms.  PCP is [name], MD , and last visit was Mar 19, 2022.  Allergies  Allergen Reactions   Doxepin Hcl     Other reaction(s): Body swelling   Estradiol     Other reaction(s): itching   Gabapentin     Other reaction(s): swelling   Levofloxacin     Other reaction(s): Unknown   Lisinopril Itching    No energy Other reaction(s): itching, hair loss   Nebivolol Hcl     Other reaction(s): fatigue   Olmesartan     Other reaction(s): hair loss, nail changes   Tramadol Hcl     Other reaction(s): abdominal pain   Penicillins Itching and Rash    Has patient had a PCN reaction causing immediate rash, facial/tongue/throat swelling, SOB or lightheadedness with hypotension: No Has patient had a PCN reaction causing severe rash involving mucus membranes or skin necrosis: No Has patient had a PCN reaction that required hospitalization: No Has patient had a PCN reaction occurring within the last 10 years: No If all of the above answers are "NO", then may proceed with Cephalosporin use.   Sulfa Antibiotics Itching and Rash    Other reaction(s): Unknown    Review of Systems: Negative except as noted in the HPI.  Objective:  No changes noted in today's physical examination. General: Alisha [name] is a pleasant 87 y.o. African American female, in NAD. AAO x 3.   Vascular:  Capillary fill time to digits <3 seconds b/l lower extremities. Palpable DP pulse(s) b/l lower extremities Palpable PT pulse(s) b/l lower extremities Pedal hair sparse. Lower extremity skin temperature gradient within normal limits. No pain with calf compression b/l. No edema noted b/l lower extremities.  Dermatological:  Pedal skin with normal turgor, texture and tone b/l lower extremities. No open wounds b/l lower extremities. No interdigital macerations b/l lower extremities. Toenails 1-5 b/l elongated, discolored, dystrophic, thickened, crumbly with subungual debris and tenderness to dorsal palpation. Porokeratotic lesion right hallux with tenderness to palpation. No erythema, no edema, no drainage, no flocculence.   Musculoskeletal:  Normal muscle strength 5/5 to all lower extremity muscle groups bilaterally. Hammertoe(s) noted to the L hallux, L 2nd toe, and L 3rd toe.  Neurological:  Pt has subjective symptoms of neuropathy. Protective sensation decreased with 10 gram monofilament b/l.     Latest Ref Rng & Units 11/22/2021    2:25 PM  Hemoglobin A1C  Hemoglobin-A1c 4.8 - 5.6 % 5.3    Assessment/Plan: 1. Pain due to onychomycosis of toenails of both feet   2. Porokeratosis   3. Neuropathy     -Patient was evaluated and treated. All patient's and/or POA's questions/concerns answered on today's visit. -Discussed neuropathy and recommended Nervive supplement. Instructed her to discuss with her PCP and Oncologist. Dispensed printed information . -Mycotic toenails 1-5 bilaterally were debrided in  length and girth with sterile nail nippers and dremel without incident. -Porokeratotic lesion(s) R hallux pared and enucleated with sterile scalpel blade without incident. Total number of lesions debrided=1. -Patient/POA to call should there  be question/concern in the interim.   Return in about 3 months (around 06/24/2022).  Marzetta Board, DPM

## 2022-04-07 ENCOUNTER — Other Ambulatory Visit: Payer: Self-pay | Admitting: Cardiology

## 2022-04-10 ENCOUNTER — Other Ambulatory Visit: Payer: Self-pay | Admitting: Cardiology

## 2022-04-14 ENCOUNTER — Encounter: Payer: Self-pay | Admitting: Hematology and Oncology

## 2022-04-14 ENCOUNTER — Inpatient Hospital Stay: Payer: Medicare HMO | Attending: Hematology and Oncology | Admitting: Hematology and Oncology

## 2022-04-14 ENCOUNTER — Inpatient Hospital Stay: Payer: Medicare HMO

## 2022-04-14 ENCOUNTER — Other Ambulatory Visit: Payer: Self-pay

## 2022-04-14 VITALS — BP 156/53 | HR 54 | Temp 97.5°F | Resp 18 | Ht 64.5 in | Wt 197.6 lb

## 2022-04-14 DIAGNOSIS — Z171 Estrogen receptor negative status [ER-]: Secondary | ICD-10-CM | POA: Insufficient documentation

## 2022-04-14 DIAGNOSIS — C50412 Malignant neoplasm of upper-outer quadrant of left female breast: Secondary | ICD-10-CM | POA: Diagnosis not present

## 2022-04-14 DIAGNOSIS — Z90710 Acquired absence of both cervix and uterus: Secondary | ICD-10-CM | POA: Insufficient documentation

## 2022-04-14 DIAGNOSIS — C3411 Malignant neoplasm of upper lobe, right bronchus or lung: Secondary | ICD-10-CM | POA: Diagnosis not present

## 2022-04-14 DIAGNOSIS — I10 Essential (primary) hypertension: Secondary | ICD-10-CM | POA: Diagnosis not present

## 2022-04-14 LAB — CBC WITH DIFFERENTIAL/PLATELET
Abs Immature Granulocytes: 0.03 10*3/uL (ref 0.00–0.07)
Basophils Absolute: 0 10*3/uL (ref 0.0–0.1)
Basophils Relative: 0 %
Eosinophils Absolute: 0.3 10*3/uL (ref 0.0–0.5)
Eosinophils Relative: 3 %
HCT: 32 % — ABNORMAL LOW (ref 36.0–46.0)
Hemoglobin: 10.6 g/dL — ABNORMAL LOW (ref 12.0–15.0)
Immature Granulocytes: 0 %
Lymphocytes Relative: 19 %
Lymphs Abs: 1.9 10*3/uL (ref 0.7–4.0)
MCH: 30.8 pg (ref 26.0–34.0)
MCHC: 33.1 g/dL (ref 30.0–36.0)
MCV: 93 fL (ref 80.0–100.0)
Monocytes Absolute: 0.8 10*3/uL (ref 0.1–1.0)
Monocytes Relative: 8 %
Neutro Abs: 6.9 10*3/uL (ref 1.7–7.7)
Neutrophils Relative %: 70 %
Platelets: 324 10*3/uL (ref 150–400)
RBC: 3.44 MIL/uL — ABNORMAL LOW (ref 3.87–5.11)
RDW: 13.3 % (ref 11.5–15.5)
WBC: 10 10*3/uL (ref 4.0–10.5)
nRBC: 0 % (ref 0.0–0.2)

## 2022-04-14 LAB — COMPREHENSIVE METABOLIC PANEL
ALT: 10 U/L (ref 0–44)
AST: 13 U/L — ABNORMAL LOW (ref 15–41)
Albumin: 4.2 g/dL (ref 3.5–5.0)
Alkaline Phosphatase: 71 U/L (ref 38–126)
Anion gap: 9 (ref 5–15)
BUN: 23 mg/dL (ref 8–23)
CO2: 25 mmol/L (ref 22–32)
Calcium: 9.5 mg/dL (ref 8.9–10.3)
Chloride: 106 mmol/L (ref 98–111)
Creatinine, Ser: 0.92 mg/dL (ref 0.44–1.00)
GFR, Estimated: >60 mL/min (ref >60)
Glucose, Bld: 113 mg/dL — ABNORMAL HIGH (ref 70–99)
Potassium: 3.8 mmol/L (ref 3.5–5.1)
Sodium: 140 mmol/L (ref 135–145)
Total Bilirubin: 0.4 mg/dL (ref 0.3–1.2)
Total Protein: 6.6 g/dL (ref 6.5–8.1)

## 2022-04-14 LAB — VITAMIN B12: Vitamin B-12: 583 pg/mL (ref 180–914)

## 2022-04-14 LAB — FERRITIN: Ferritin: 204 ng/mL (ref 11–307)

## 2022-04-14 LAB — IRON AND IRON BINDING CAPACITY (CC-WL,HP ONLY)
Iron: 62 ug/dL (ref 28–170)
Saturation Ratios: 24 % (ref 10.4–31.8)
TIBC: 256 ug/dL (ref 250–450)
UIBC: 194 ug/dL (ref 148–442)

## 2022-04-14 NOTE — Progress Notes (Signed)
[facility]  [phone] [phone]     ID: Alisha [name] DOB: 12-20-1934  MR#: [id number]  JYN#:[id number]  Patient Care Team: Farris Has, MD as PCP - General (Family Medicine) Lonie Peak, MD as Attending Physician (Radiation Oncology) Marykay Lex, MD as Consulting Physician (Cardiology) Leslye Peer, MD as Consulting Physician (Pulmonary Disease) Rachel Moulds, MD as Consulting Physician (Hematology and Oncology) Almond Lint, MD as Consulting Physician (General Surgery) Rachel Moulds, MD  CHIEF COMPLAINT: triple negative breast cancer; squamous cell lung cancer  CURRENT TREATMENT: Observation   INTERVAL HISTORY:  Alisha [name] returns today for follow up of her triple negative breast cancer.   She is doing well today except for some fatigue. She says about 2 weeks ago, she has started noticing a bit more fatigue.  Other than fatigue, she hasn't noticed any changes.  Rest of the pertinent 10 point ROS reviewed and negative.   COVID 19 VACCINATION STATUS: infection 11/2019; Pfizer x4, most recently 03/2021   Oncology History  Alisha [name] was noted to have a left breast upper-outer quadrant distortion on screening mammogram in 2017. She proceeded to biopsy ([id number]), which showed a complex sclerosing lesion. She opted to undergo left lumpectomy ([id number]) on 08/11/2016 under Dr. Luisa Hart, which revealed a radial scar with calcifications.   She presented for routine screening mammogram, which showed a possible mass and adjacent calcifications in the left breast. She was referred to [facility] for [facility] imaging on 06/16/2019. Physical exam performed that day showed a palpable soft thickening in the upper central aspect of the left breast. She underwent left diagnostic mammography with tomography and ultrasonography showing: breast density category B; suspicious 7 mm mass in the 12 o'clock location of the left  breast; small indeterminate group of calcifications 4 mm anterior to the mass warrant excision if the mass is positive for malignancy; left axilla negative for adenopathy.  Accordingly on 06/22/2019 she proceeded to biopsy of the left breast area in question. The pathology from this procedure ([id number]) showed: invasive ductal carcinoma, grade 3. Prognostic indicators significant for: estrogen receptor, 0% negative and progesterone receptor, 0% negative. Proliferation marker Ki67 at 30%. HER2 negative by immunohistochemistry (0).  (1) status post left lumpectomy and sentinel lymph node sampling 07/27/2019 for a pT1b pN0, stage IB invasive ductal carcinoma, with clear margins. (a) 2 left axillary lymph nodes were removed No adjuvant chemotherapy planned  Adjuvant radiation 09/07/2019 through 10/06/2019 Site Technique Total Dose (Gy) Dose per Fx (Gy) Completed Fx Beam Energies  Breast, Left: Breast_Lt 3D 40.05/40.05 2.67 15/15 6X, 10X  Breast, Left: Breast_Lt_Bst specialPort 10/10 2 5/5 15E    LUNG CANCER CT of the chest 11/14/2020 showed a new (as compared to July 2020) right upper lobe nodule measuring 0.9 cm, with no other findings of concern  (a) PET scan 12/07/2020 find the right upper lobe nodule in question to be hypermetabolic, with no other hypermetabolic areas elsewhere in the chest or abdomen  (b) CA 27-29 and CEA obtained 12/20/2020 were in the normal range  (c) bronchoscopic biopsy 12/27/2020 read as consistent with squamous cell carcinoma  Radiation to lung with curative intent 02/13/2021 through 02/20/2021 Site Technique Total Dose (Gy) Dose per Fx (Gy) Completed Fx Beam Energies  Lung, Right: Lung_Rt_RUL IMRT 54/54 18 3/3 6XFFF    10/18/2021  Left mammogram showed a 1.0 cm mass in the left breast at 10:00, no evidence of left axillary lymphadenopathy Biopsy from this mass showed invasive ductal carcinoma  grade 2 of 3, prognostic markers show ER 0% negative, PR 0% negative, KI of  20% and HER2 negative.  11/26/2021 patient underwent left mastectomy, final pathology showed invasive ductal carcinoma, 2 cm, grade, resection margins are -3 sentinel lymph nodes negative for carcinoma, prognostics not repeated.  Initial prognostics ER 0% negative, PR 0% negative, HER2 negative, Ki-67 of 20%.  During her last visit, we have discussed about TC adjuvant chemotherapy for 4 cycles.  PAST MEDICAL HISTORY: Past Medical History:  Diagnosis Date   Arthritis    Asthma    Never smoker   Blood transfusion    over 40 y ears   Breast mass, left    Cancer [facility])    History of radiation therapy 09/07/19- 10/06/19   Left Breast 15 fractions of 2.67 Gy to total 40.05 Gy. Left breast boost 5 fractions of 2 Gy each to total 10 Gy   Hypertension    on meds   Hypothyroidism    on meds   Personal history of radiation therapy    Pre-diabetes     PAST SURGICAL HISTORY: Past Surgical History:  Procedure Laterality Date   ABDOMINAL HYSTERECTOMY     32 yrs   AXILLARY SENTINEL NODE BIOPSY Left 11/26/2021   Procedure: LEFT AXILLARY SENTINEL NODE BIOPSY;  Surgeon: Almond Lint, MD;  Location: [facility] OR;  Service: General;  Laterality: Left;   BREAST LUMPECTOMY Left 08/11/2016   BREAST LUMPECTOMY Left 07/27/2019   BREAST LUMPECTOMY WITH RADIOACTIVE SEED AND SENTINEL LYMPH NODE BIOPSY Left 07/27/2019   Procedure: LEFT BREAST LUMPECTOMY WITH RADIOACTIVE SEED AND LEFT SENTINEL LYMPH NODE MAPPING;  Surgeon: Harriette Bouillon, MD;  Location: [facility] OR;  Service: General;  Laterality: Left;   BREAST LUMPECTOMY WITH RADIOACTIVE SEED LOCALIZATION Left 08/11/2016   Procedure: LEFT BREAST LUMPECTOMY WITH RADIOACTIVE SEED LOCALIZATION;  Surgeon: Harriette Bouillon, MD;  Location: [facility];  Service: General;  Laterality: Left;   BRONCHIAL BIOPSY  12/27/2020   Procedure: BRONCHIAL BIOPSIES;  Surgeon: Leslye Peer, MD;  Location: [facility] ENDOSCOPY;  Service: Pulmonary;;   BRONCHIAL BRUSHINGS  12/27/2020    Procedure: BRONCHIAL BRUSHINGS;  Surgeon: Leslye Peer, MD;  Location[facility] END[facility];  Service: Pulmonary;;   BRONCHIAL NEEDLE ASPIRATION BIOPSY  12/27/2020   Procedure: BRONCHIAL NEEDLE ASPIRATION BIOPSIES;  Surgeon: Leslye Peer, MD;  Location: [facility] ENDOSCOPY;  Service: Pulmonary;;   BRONCHIAL WASHINGS  12/27/2020   Procedure: BRONCHIAL WASHINGS;  Surgeon: Leslye Peer, MD;  Location: [facility] ENDOSCOPY;  Service: Pulmonary;;   CATARACT EXTRACTION, BILATERAL     CORONARY CALCIUM SCORE-CT ANGIOGRAM  04/2019   Small, < 5mm nodule noted.  Difficult study to read.  Coronary calcium score was 11.  Significant motion artifact, but visible territory showed minimal disease.  Minimal calcium noted in the ostial LAD but no suggestion of significant CAD.   EYE SURGERY     FIDUCIAL MARKER PLACEMENT  12/27/2020   Procedure: FIDUCIAL MARKER PLACEMENT;  Surgeon: Leslye Peer, MD;  Location: [facility] ENDOSCOPY;  Service: Pulmonary;;   JOINT REPLACEMENT Bilateral    left knee 2012   KNEE ARTHROPLASTY  02/17/2012   Procedure: COMPUTER ASSISTED TOTAL KNEE ARTHROPLASTY;  Surgeon: Cammy Copa, MD;  Location: [facility] OR;  Service: Orthopedics;  Laterality: Right;  Right total knee replacement   MASTECTOMY W/ SENTINEL NODE BIOPSY Left 11/26/2021   Procedure: LEFT BREAST MASTECTOMY;  Surgeon: Almond Lint, MD;  Location: [facility] OR;  Service: General;  Laterality: Left;   TRANSTHORACIC ECHOCARDIOGRAM  04/02/2020   Normal LV function.  No R WMA.  Moderate LVH.  GRII DD with elevated filling pressures.  Moderate LA dilation with trivial MR.  Mild aortic valve sclerosis.   VIDEO BRONCHOSCOPY WITH ENDOBRONCHIAL NAVIGATION N/A 12/27/2020   Procedure: VIDEO BRONCHOSCOPY WITH ENDOBRONCHIAL NAVIGATION;  Surgeon: Leslye Peer, MD;  Location: [facility] ENDOS[facility];  Service: Pulmonary;  Laterality: N/A;    FAMILY HISTORY: Family History  Problem Relation Age of Onset   Other Mother        died @ 60 - but was healthy    Hypertension Sister    Diabetes Sister    Diabetes Brother        no longer on medications.    Anesthesia problems Neg Hx    Hypotension Neg Hx    Malignant hyperthermia Neg Hx    Pseudochol deficiency Neg Hx   Patient's father was in his mid 66s years old when he died from unknown causes. Patient's mother died from "natural causes" at age 72. The patient denies a family hx of breast or ovarian cancer. She has 5 brothers and 1 sister.   GYNECOLOGIC HISTORY:  No LMP recorded. Patient is postmenopausal. 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  She is now retired.  She describes herself a single.  Her daughter Alisha [name] lives in [place].  She has multiple medical problems.  The patient's son died at age 57.  The patient has 1 grandchild, Alisha [name], who is buying a home 5 minutes from the patient t and works for [employer].  He has 4 children, 2 have gone through college, one is "in between" and the other 1 is currently in college.  The patient herself attends a local [employer]   ADVANCED DIRECTIVES: The patient intends to name her grandson Alisha [name] as her healthcare power of attorney.  He can be reached at [phone].  She was given the appropriate documents to complete and notarize at her discretion at the time of her 07/21/2019 visit   HEALTH MAINTENANCE: Social History   Tobacco Use   Smoking status: Never   Smokeless tobacco: Never  Vaping Use   Vaping Use: Never used  Substance Use Topics   Alcohol use: No   Drug use: No     Allergies  Allergen Reactions   Doxepin Hcl     Other reaction(s): Body swelling   Estradiol     Other reaction(s): itching    Gabapentin     Other reaction(s): swelling   Levofloxacin     Other reaction(s): Unknown   Lisinopril Itching    No energy Other reaction(s): itching, hair loss   Nebivolol Hcl     Other reaction(s): fatigue   Olmesartan     Other reaction(s): hair loss, nail changes   Tramadol Hcl     Other reaction(s): abdominal pain   Penicillins Itching and Rash    Has patient had a PCN reaction causing immediate rash, facial/tongue/throat swelling, SOB or lightheadedness with hypotension: No Has patient had a PCN reaction causing severe rash involving mucus membranes or skin necrosis: No Has patient had a PCN reaction that required hospitalization: No Has patient had a PCN reaction occurring within the last  10 years: No If all of the above answers are "NO", then may proceed with Cephalosporin use.   Sulfa Antibiotics Itching and Rash    Other reaction(s): Unknown    Current Outpatient Medications  Medication Sig Dispense Refill   acetaminophen (TYLENOL) 500 MG tablet Take 1,000 mg by mouth every 6 (six) hours as needed (pain).     albuterol (PROVENTIL HFA;VENTOLIN HFA) 108 (90 Base) MCG/ACT inhaler Inhale 1-2 puffs into the lungs every 6 (six) hours as needed for wheezing or shortness of breath.      amLODipine (NORVASC) 10 MG tablet TAKE ONE TABLET BY MOUTH AT NOON 90 tablet 0   Artificial Tear Solution (SOOTHE XP OP) Place 1 drop into both eyes 2 (two) times daily as needed (dry eyes).     atenolol (TENORMIN) 25 MG tablet atenolol 25 mg tablet     Biotin [address] MCG TABS Take by mouth daily at 12 noon.     Biotin 5 MG CAPS 1 capsule     cetaphil (CETAPHIL) cream Apply 1 application topically as needed (dry skin).     chlorthalidone (HYGROTON) 25 MG tablet TAKE ONE TABLET BY MOUTH AT NOON 90 tablet 1   Cholecalciferol 125 MCG (5000 UT) capsule 1 capsule (Patient not taking: Reported on 03/07/2022)     clindamycin (CLEOCIN) 300 MG capsule Take 600 mg by mouth See admin instructions. Take before  dental appointments (Patient not taking: Reported on 03/07/2022)     COVID-19 mRNA vaccine, Moderna, 100 MCG/0.5ML injection Inject into the muscle. 0.5 mL 0   diclofenac Sodium (VOLTAREN ARTHRITIS PAIN) 1 % GEL See admin instructions.     diltiazem (CARDIZEM) 120 MG tablet diltiazem 120 mg tablet     doxepin (SINEQUAN) 25 MG capsule doxepin 25 mg capsule     fluticasone (FLONASE) 50 MCG/ACT nasal spray Place 2 sprays into both nostrils daily as needed for allergies.     furosemide (LASIX) 20 MG tablet Take 20 mg by mouth daily as needed for fluid or edema.     gabapentin (NEURONTIN) 100 MG capsule 1 capsule (Patient not taking: Reported on 03/07/2022)     hydrochlorothiazide (HYDRODIURIL) 25 MG tablet hydrochlorothiazide 25 mg tablet     indapamide (LOZOL) 1.25 MG tablet indapamide 1.25 mg tablet     levothyroxine (SYNTHROID, LEVOTHROID) 88 MCG tablet Take 88 mcg by mouth daily before breakfast.      meclizine (ANTIVERT) 12.5 MG tablet Take 12.5 mg by mouth 3 (three) times daily as needed for dizziness.     meperidine (DEMEROL) 50 MG tablet meperidine 50 mg tablet  take 1 tablet by mouth every 4 to 6 hours if needed for pain (Patient not taking: Reported on 03/07/2022)     methocarbamol (ROBAXIN) 500 MG tablet Take 1 tablet (500 mg total) by mouth every 6 (six) hours as needed for muscle spasms. (Patient not taking: Reported on 03/07/2022) 10 tablet 1   Multiple Vitamins-Minerals (PRESERVISION AREDS 2+MULTI VIT) CAPS Take 1 capsule by mouth in the morning and at bedtime.     oxyCODONE (OXY IR/ROXICODONE) 5 MG immediate release tablet Take 1-2 tablets (5-10 mg total) by mouth every 6 (six) hours as needed for moderate pain. (Patient not taking: Reported on 03/07/2022) 5 tablet 0   polyethylene glycol (GOLYTELY) 236 g solution peg 3350-electrolytes 236 gram-22.74 gram-6.74 gram-5.86 gram solution  USE AS DIRECTED     potassium chloride SA (KLOR-CON) 20 MEQ tablet Take 20 mEq by mouth daily as needed  (when  taking furosemide).     predniSONE (DELTASONE) 20 MG tablet prednisone 20 mg tablet (Patient not taking: Reported on 03/07/2022)     Travoprost, BAK Free, (TRAVATAN) 0.004 % SOLN ophthalmic solution Travatan Z 0.004 % eye drops     valsartan (DIOVAN) 320 MG tablet Take 1 tablet (320 mg total) by mouth daily at 12 noon. Will need an appointment to for future refill 30 tablet 0   No current facility-administered medications for this visit.    OBJECTIVE: African-American woman who appears stated age  Vitals:   04/14/22 1126  BP: (!) 156/53  Pulse: (!) 54  Resp: 18  Temp: (!) 97.5 F (36.4 C)  SpO2: 100%      Body mass index is 33.39 kg/m.   Wt Readings from Last 3 Encounters:  04/14/22 197 lb 9.6 oz (89.6 kg)  03/07/22 200 lb 6.4 oz (90.9 kg)  01/13/22 194 lb 9.6 oz (88.3 kg)      ECOG FS:2 - Symptomatic, <50% confined to bed  Physical Exam Constitutional:      Appearance: Normal appearance.  Chest:     Comments: Right breast normal to inspection and palpation. Left breast s/p mastectomy. No palpable changes No lymphadenopathy Musculoskeletal:     Cervical back: Normal range of motion and neck supple. No rigidity.  Lymphadenopathy:     Cervical: No cervical adenopathy.  Neurological:     Mental Status: She is alert.      LAB RESULTS:  CMP     Component Value Date/Time   NA 138 11/27/2021 0321   NA 141 01/24/2021 1627   K 4.1 11/27/2021 0321   CL 104 11/27/2021 0321   CO2 23 11/27/2021 0321   GLUCOSE 144 (H) 11/27/2021 0321   BUN 19 11/27/2021 0321   BUN 25 01/24/2021 1627   CREATININE 0.86 11/27/2021 0321   CREATININE 0.78 07/21/2019 1525   CALCIUM 8.6 (L) 11/27/2021 0321   PROT 6.6 07/22/2021 1007   ALBUMIN 4.0 07/22/2021 1007   AST 16 07/22/2021 1007   AST 15 07/21/2019 1525   ALT 12 07/22/2021 1007   ALT 19 07/21/2019 1525   ALKPHOS 70 07/22/2021 1007   BILITOT 0.4 07/22/2021 1007   BILITOT 0.2 (L) 07/21/2019 1525   GFRNONAA >60 11/27/2021  0321   GFRNONAA >60 07/21/2019 1525   GFRAA 76 12/13/2020 1035   GFRAA >60 07/21/2019 1525    No results found for: "TOTALPROTELP", "ALBUMINELP", "A1GS", "A2GS", "BETS", "BETA2SER", "GAMS", "MSPIKE", "SPEI"  No results found for: "KPAFRELGTCHN", "LAMBDASER", "KAPLAMBRATIO"  Lab Results  Component Value Date   WBC 12.9 (H) 11/27/2021   NEUTROABS 5.8 07/22/2021   HGB 9.6 (L) 11/27/2021   HCT 30.3 (L) 11/27/2021   MCV 95.9 11/27/2021   PLT 256 11/27/2021   No results found for: "LABCA2"  No components found for: "ZOXWRU045"  No results for input(s): "INR" in the last 168 hours.  No results found for: "LABCA2"  No results found for: "CAN199"  No results found for: "CAN125"  No results found for: "WUJ811"  Lab Results  Component Value Date   CA2729 15.8 12/20/2020    No components found for: "HGQUANT"  Lab Results  Component Value Date   CEA1 1.24 12/20/2020   /  CEA ([facility])  Date Value Ref Range Status  12/20/2020 1.24 0.00 - 5.00 ng/mL Final    Comment:    (NOTE) This test was performed using Architect's Chemiluminescent Microparticle Immunoassay. Values obtained from different assay methods cannot be  used interchangeably. Please note that 5-10% of patients who smoke may see CEA levels up to 6.9 ng/mL. Performed at [facility], [address], [place], [place] [address]      No results found for: "AFPTUMOR"  No results found for: "CHROMOGRNA"  No results found for: "HGBA", "HGBA2QUANT", "HGBFQUANT", "HGBSQUAN" (Hemoglobinopathy evaluation)   No results found for: "LDH"  No results found for: "IRON", "TIBC", "IRONPCTSAT" (Iron and TIBC)  No results found for: "FERRITIN"  Urinalysis    Component Value Date/Time   COLORURINE YELLOW 07/02/2018 1752   APPEARANCEUR CLEAR 07/02/2018 1752   LABSPEC 1.021 07/02/2018 1752   PHURINE 5.0 07/02/2018 1752   GLUCOSEU NEGATIVE 07/02/2018 1752   HGBUR NEGATIVE 07/02/2018  1752   BILIRUBINUR NEGATIVE 07/02/2018 1752   KETONESUR 20 (A) 07/02/2018 1752   PROTEINUR NEGATIVE 07/02/2018 1752   UROBILINOGEN 1.0 02/20/2012 0927   NITRITE NEGATIVE 07/02/2018 1752   LEUKOCYTESUR NEGATIVE 07/02/2018 1752    STUDIES: No results found.   ELIGIBLE FOR AVAILABLE RESEARCH PROTOCOL: no  ASSESSMENT:   87 y.o. [employer], [place] with recently diagnosed left breast upper outer quadrant invasive ductal carcinoma, triple negative who is here for follow-up.  Please refer to oncological history for complete history.  Her last breast cancer back in 2020 was in the left breast at 12 o'clock position again IDC triple negative.  At that time she had surgery, adjuvant radiation, no adjuvant chemotherapy.  Since then she also had lung cancer which was treated with SBRT with excellent response.  She most recently had an abnormal mammogram of the left breast which showed a 1 cm tumor, triple negative on the initial biopsy, grade 2.  Discussion was to consider upfront surgery given her age and she is now status post left mastectomy.  Final pathology showed 2 cm, grade 2 tumor, prognostics not repeated, initial prognostics ER 0%, PR 0% and HER2 negative. Given triple negative pathology and tumor greater than 5 mm, we have discussed about adjuvant docetaxel and cyclophosphamide every 21 days for 4 cycles especially since she has robust performance status. We have discussed about adverse effects of chemotherapy including but not limited to fatigue, nausea, vomiting, diarrhea, increased risk of cytopenias, neuropathy, infections etc.  She declined adjuvant chemotherapy. She is here for follow up. No concerning ROS except for fatigue. PE benign, no findings. CBC with improved hemoglobin. NO evidence of iron or B12 deficiency. Will order her mammogram for Nov 2023. She also needs left axillary [place], recommended by Dr Donell Beers. RTC in 2/3 weeks to discuss lab results FU with me in November. Mammogram  ordered, Dr Donell Beers also recommended [place] left axilla, ordered.  Total encounter time 30 minutes.  *Total Encounter Time as defined by the Centers for Medicare and Medicaid Services includes, in addition to the face-to-face time of a patient visit (documented in the note above) non-face-to-face time: obtaining and reviewing outside history, ordering and reviewing medications, tests or procedures, care coordination (communications with other health care professionals or caregivers) and documentation in the medical record.

## 2022-04-15 LAB — FOLATE RBC
Folate, Hemolysate: 405 ng/mL
Folate, RBC: 1235 ng/mL (ref >498)
Hematocrit: 32.8 % — ABNORMAL LOW (ref 34.0–46.6)

## 2022-04-18 DIAGNOSIS — H409 Unspecified glaucoma: Secondary | ICD-10-CM | POA: Diagnosis not present

## 2022-04-18 DIAGNOSIS — I10 Essential (primary) hypertension: Secondary | ICD-10-CM | POA: Diagnosis not present

## 2022-04-18 DIAGNOSIS — E785 Hyperlipidemia, unspecified: Secondary | ICD-10-CM | POA: Diagnosis not present

## 2022-04-18 DIAGNOSIS — E039 Hypothyroidism, unspecified: Secondary | ICD-10-CM | POA: Diagnosis not present

## 2022-04-18 DIAGNOSIS — M1990 Unspecified osteoarthritis, unspecified site: Secondary | ICD-10-CM | POA: Diagnosis not present

## 2022-04-23 ENCOUNTER — Telehealth: Payer: Self-pay | Admitting: *Deleted

## 2022-04-23 NOTE — Telephone Encounter (Signed)
CALLED PATIENT TO INFORM OF CT FOR 05-07-22- ARRIVAL TIME- 1:15 PM @ [facility], NO RESTRICTIONS TO TEST, PATIENT TO RECEIVE RESULTS FROM DR. SQUIRE ON 05/09/22 @ 2:20 PM, [name]

## 2022-04-24 ENCOUNTER — Telehealth: Payer: Self-pay | Admitting: *Deleted

## 2022-04-24 NOTE — Telephone Encounter (Signed)
RETURNED PATIENT'S PHONE CALL, [name] 

## 2022-04-27 NOTE — Progress Notes (Unsigned)
[place]   [phone] [phone]   Clinic Follow up Note   Patient Care Team: [name], MD as PCP - General (Family Medicine) Eppie Gibson, MD as Attending Physician (Radiation Oncology) Leonie Man, MD as Consulting Physician (Cardiology) Collene Gobble, MD as Consulting Physician (Pulmonary Disease) Benay Pike, MD as Consulting Physician (Hematology and Oncology) Stark Klein, MD as Consulting Physician (General Surgery) 04/27/2022  CHIEF COMPLAINT:   SUMMARY OF ONCOLOGIC HISTORY: Oncology History  Malignant neoplasm of upper-outer quadrant of left breast in female, estrogen receptor negative ([place])  06/22/2019 Cancer Staging   Staging form: Breast, AJCC 8th Edition - Clinical stage from 06/22/2019: Stage IB (cT1b, cN0, cM0, G3, ER-, PR-, HER2-)   06/29/2019 Initial Diagnosis   Malignant neoplasm of upper-outer quadrant of left breast in female, estrogen receptor negative ([place])   07/27/2019 Surgery   Left lumpectomy ([place]) ([id number]): IDC, 0.9 cm, clear margins. 2 lymph nodes were negative for carcinoma. Triple negative. Ki-67 was 30%.   09/07/2019 - 10/06/2019 Radiation Therapy   The patient initially received a dose of 40.05 Gy in 15 fractions to the breast using whole-breast tangent fields. This was delivered using a 3-D conformal technique. The pt received a boost delivering an additional 10 Gy in 5 fractions using a electron boost with 81mV electrons. The total dose was 50.05 Gy.   08/26/2020 Cancer Staging   Staging form: Breast, AJCC 8th Edition - Pathologic: Stage IB (pT1b, pN0, cM0, G3, ER-, PR-, HER2-)   [place]align[place] lobe of right lung [place]  01/23/2021 Initial Diagnosis   Malignant neoplasm of upper lobe of right lung ([place]   01/23/2021 Cancer Staging   Staging form: Lung, AJCC 8th Edition - Clinical stage from 01/23/2021: cT1, cN0, cM0 - Signed by SEppie Gibson MD on 01/23/2021 Stage prefix:  Initial diagnosis Laterality: Right   Malignant neoplasm of upper-inner quadrant of left breast in female, estrogen receptor negative ([place]  10/29/2021 Cancer Staging   Staging form: Breast, AJCC 8th Edition - Clinical stage from 10/29/2021: Stage IB (cT1c, cN0, cM0, G2, ER-, PR-, HER2-) - Signed by CGardenia Phlegm NP on 11/06/2021 Stage prefix: Initial diagnosis Histologic grading system: 3 grade system   11/06/2021 Initial Diagnosis   Malignant neoplasm of upper-inner quadrant of left breast in female, estrogen receptor negative ([place]     CURRENT THERAPY:   INTERVAL HISTORY:   REVIEW OF SYSTEMS:   Constitutional: Denies fevers, chills or abnormal weight loss Eyes: Denies blurriness of vision Ears, nose, mouth, throat, and face: Denies mucositis or sore throat Respiratory: Denies cough, dyspnea or wheezes Cardiovascular: Denies palpitation, chest discomfort or lower extremity swelling Gastrointestinal:  Denies nausea, heartburn or change in bowel habits Skin: Denies abnormal skin rashes Lymphatics: Denies new lymphadenopathy or easy bruising Neurological:Denies numbness, tingling or new weaknesses Behavioral/Psych: Mood is stable, no new changes  All other systems were reviewed with the patient and are negative.  MEDICAL HISTORY:  Past Medical History:  Diagnosis Date   Arthritis    Asthma    Never smoker   Blood transfusion    over 42 y ears   Breast mass, left    Cancer ([facility]    History of radiation therapy 09/07/19- 10/06/19   Left Breast 15 fractions of 2.67 Gy to total 40.05 Gy. Left breast boost 5 fractions of 2 Gy each to total 10 Gy   Hypertension    on meds   Hypothyroidism    on meds  Personal history of radiation therapy    Pre-diabetes     SURGICAL HISTORY: Past Surgical History:  Procedure Laterality Date   ABDOMINAL HYSTERECTOMY     32 yrs   AXILLARY SENTINEL NODE BIOPSY Left 11/26/2021   Procedure: LEFT AXILLARY SENTINEL NODE BIOPSY;   Surgeon: Stark Klein, MD;  Location: [place];  Service: General;  Laterality: Left;   BREAST LUMPECTOMY Left 08/11/2016   BREAST LUMPECTOMY Left 07/27/2019   BREAST LUMPECTOMY WITH RADIOACTIVE SEED AND SENTINEL LYMPH NODE BIOPSY Left 07/27/2019   Procedure: LEFT BREAST LUMPECTOMY WITH RADIOACTIVE SEED AND LEFT SENTINEL LYMPH NODE MAPPING;  Surgeon: Erroll Luna, MD;  Location: [place];  Service: General;  Laterality: Left;   BREAST LUMPECTOMY WITH RADIOACTIVE SEED LOCALIZATION Left 08/11/2016   Procedure: LEFT BREAST LUMPECTOMY WITH RADIOACTIVE SEED LOCALIZATION;  Surgeon: Erroll Luna, MD;  Location: [place];  Service: General;  Laterality: Left;   BRONCHIAL BIOPSY  12/27/2020   Procedure: BRONCHIAL BIOPSIES;  Surgeon: Collene Gobble, MD;  Location[facility] ENDOSCOPY;  Service: Pulmonary;;   BRONCHIAL BRUSHINGS  12/27/2020   Procedure: BRONCHIAL BRUSHINGS;  Surgeon: Collene Gobble, MD;  Location: [facility] ENDOSCOPY;  Service: Pulmonary;;   BRONCHIAL NEEDLE ASPIRATION BIOPSY  12/27/2020   Procedure: BRONCHIAL NEEDLE ASPIRATION BIOPSIES;  Surgeon: Collene Gobble, MD;  Location[facility] ENDOSCOPY;  Service: Pulmonary;;   BRONCHIAL WASHINGS  12/27/2020   Procedure: BRONCHIAL WASHINGS;  Surgeon: Collene Gobble, MD;  Location: [facility] ENDOSCOPY;  Service: Pulmonary;;   CATARACT EXTRACTION, BILATERAL     CORONARY CALCIUM SCORE-CT ANGIOGRAM  04/2019   Small, < 13m nodule noted.  Difficult study to read.  Coronary calcium score was 11.  Significant motion artifact, but visible territory showed minimal disease.  Minimal calcium noted in the ostial LAD but no suggestion of significant CAD.   EYE SURGERY     FIDUCIAL MARKER PLACEMENT  12/27/2020   Procedure: FIDUCIAL MARKER PLACEMENT;  Surgeon: BCollene Gobble MD;  [facility];  Service: Pulmonary;;   JOINT REPLACEMENT Bilateral    left knee 2012   KNEE ARTHROPLASTY  02/17/2012   Procedure: COMPUTER ASSISTED TOTAL KNEE ARTHROPLASTY;   Surgeon: GMeredith Pel MD;  [place]  Service: Orthopedics;  Laterality: Right;  Right total knee replacement   MASTECTOMY W/ SENTINEL NODE BIOPSY Left 11/26/2021   Procedure: LEFT BREAST MASTECTOMY;  Surgeon: BStark Klein MD;  [place]  Service: General;  Laterality: Left;   TRANSTHORACIC ECHOCARDIOGRAM  04/02/2020   Normal LV function.  No R WMA.  Moderate LVH.  GRII DD with elevated filling pressures.  Moderate LA dilation with trivial MR.  Mild aortic valve sclerosis.   VIDEO BRONCHOSCOPY WITH ENDOBRONCHIAL NAVIGATION N/A 12/27/2020   Procedure: VIDEO BRONCHOSCOPY WITH ENDOBRONCHIAL NAVIGATION;  Surgeon: BCollene Gobble MD;  [facility];  Service: Pulmonary;  Laterality: N/A;    I have reviewed the social history and family history with the patient and they are unchanged from previous note.  ALLERGIES:  is allergic to doxepin hcl, estradiol, gabapentin, levofloxacin, lisinopril, nebivolol hcl, olmesartan, tramadol hcl, penicillins, and sulfa antibiotics.  MEDICATIONS:  Current Outpatient Medications  Medication Sig Dispense Refill   acetaminophen (TYLENOL) 500 MG tablet Take 1,000 mg by mouth every 6 (six) hours as needed (pain).     albuterol (PROVENTIL HFA;VENTOLIN HFA) 108 (90 Base) MCG/ACT inhaler Inhale 1-2 puffs into the lungs every 6 (six) hours as needed for wheezing or shortness of breath.  amLODipine (NORVASC) 10 MG tablet TAKE ONE TABLET BY MOUTH AT NOON 90 tablet 0   Artificial Tear Solution (SOOTHE XP OP) Place 1 drop into both eyes 2 (two) times daily as needed (dry eyes).     atenolol (TENORMIN) 25 MG tablet atenolol 25 mg tablet     Biotin 10000 MCG TABS Take by mouth daily at 12 noon.     Biotin 5 MG CAPS 1 capsule     cetaphil (CETAPHIL) cream Apply 1 application topically as needed (dry skin).     chlorthalidone (HYGROTON) 25 MG tablet TAKE ONE TABLET BY MOUTH AT NOON 90 tablet 1   Cholecalciferol 125 MCG (5000 UT) capsule 1  capsule (Patient not taking: Reported on 03/07/2022)     clindamycin (CLEOCIN) 300 MG capsule Take 600 mg by mouth See admin instructions. Take before dental appointments (Patient not taking: Reported on 03/07/2022)     COVID-19 mRNA vaccine, Moderna, 100 MCG/0.5ML injection Inject into the muscle. 0.5 mL 0   diclofenac Sodium (VOLTAREN ARTHRITIS PAIN) 1 % GEL See admin instructions.     diltiazem (CARDIZEM) 120 MG tablet diltiazem 120 mg tablet     doxepin (SINEQUAN) 25 MG capsule doxepin 25 mg capsule     fluticasone (FLONASE) 50 MCG/ACT nasal spray Place 2 sprays into both nostrils daily as needed for allergies.     furosemide (LASIX) 20 MG tablet Take 20 mg by mouth daily as needed for fluid or edema.     gabapentin (NEURONTIN) 100 MG capsule 1 capsule (Patient not taking: Reported on 03/07/2022)     hydrochlorothiazide (HYDRODIURIL) 25 MG tablet hydrochlorothiazide 25 mg tablet     indapamide (LOZOL) 1.25 MG tablet indapamide 1.25 mg tablet     levothyroxine (SYNTHROID, LEVOTHROID) 88 MCG tablet Take 88 mcg by mouth daily before breakfast.      meclizine (ANTIVERT) 12.5 MG tablet Take 12.5 mg by mouth 3 (three) times daily as needed for dizziness.     meperidine (DEMEROL) 50 MG tablet meperidine 50 mg tablet  take 1 tablet by mouth every 4 to 6 hours if needed for pain (Patient not taking: Reported on 03/07/2022)     methocarbamol (ROBAXIN) 500 MG tablet Take 1 tablet (500 mg total) by mouth every 6 (six) hours as needed for muscle spasms. (Patient not taking: Reported on 03/07/2022) 10 tablet 1   Multiple Vitamins-Minerals (PRESERVISION AREDS 2+MULTI VIT) CAPS Take 1 capsule by mouth in the morning and at bedtime.     oxyCODONE (OXY IR/ROXICODONE) 5 MG immediate release tablet Take 1-2 tablets (5-10 mg total) by mouth every 6 (six) hours as needed for moderate pain. (Patient not taking: Reported on 03/07/2022) 5 tablet 0   polyethylene glycol (GOLYTELY) 236 g solution peg 3350-electrolytes 236  gram-22.74 gram-6.74 gram-5.86 gram solution  USE AS DIRECTED     potassium chloride SA (KLOR-CON) 20 MEQ tablet Take 20 mEq by mouth daily as needed (when taking furosemide).     predniSONE (DELTASONE) 20 MG tablet prednisone 20 mg tablet (Patient not taking: Reported on 03/07/2022)     Travoprost, BAK Free, (TRAVATAN) 0.004 % SOLN ophthalmic solution Travatan Z 0.004 % eye drops     valsartan (DIOVAN) 320 MG tablet Take 1 tablet (320 mg total) by mouth daily at 12 noon. Will need an appointment to for future refill 30 tablet 0   No current facility-administered medications for this visit.    PHYSICAL EXAMINATION: ECOG PERFORMANCE STATUS: {CHL ONC ECOG DJ:[id number]}  There were  no vitals filed for this visit. There were no vitals filed for this visit.  GENERAL:alert, no distress and comfortable SKIN: skin color, texture, turgor are normal, no rashes or significant lesions EYES: normal, Conjunctiva are pink and non-injected, sclera clear OROPHARYNX:no exudate, no erythema and lips, buccal mucosa, and tongue normal  NECK: supple, thyroid normal size, non-tender, without nodularity LYMPH:  no palpable lymphadenopathy in the cervical, axillary or inguinal LUNGS: clear to auscultation and percussion with normal breathing effort HEART: regular rate & rhythm and no murmurs and no lower extremity edema ABDOMEN:abdomen soft, non-tender and normal bowel sounds Musculoskeletal:no cyanosis of digits and no clubbing  NEURO: alert & oriented x 3 with fluent speech, no focal motor/sensory deficits  LABORATORY DATA:  I have reviewed the data as listed    Latest Ref Rng & Units 04/14/2022   11:50 AM 11/27/2021    3:21 AM 11/22/2021    2:25 PM  CBC  WBC 4.0 - 10.5 K/uL 10.0  12.9  9.7   Hemoglobin 12.0 - 15.0 g/dL 10.6  9.6  11.1   Hematocrit 34.0 - 46.6 % 36.0 - 46.0 % 32.8    32.0  30.3  35.0   Platelets 150 - 400 K/uL 324  256  330         Latest Ref Rng & Units 04/14/2022   11:50 AM  11/27/2021    3:21 AM 11/22/2021    2:25 PM  CMP  Glucose 70 - 99 mg/dL 113  144  159   BUN 8 - 23 mg/dL '23  19  15   ' Creatinine 0.44 - 1.00 mg/dL 0.92  0.86  0.77   Sodium 135 - 145 mmol/L 140  138  141   Potassium 3.5 - 5.1 mmol/L 3.8  4.1  3.4   Chloride 98 - 111 mmol/L 106  104  105   CO2 22 - 32 mmol/L '25  23  24   ' Calcium 8.9 - 10.3 mg/dL 9.5  8.6  9.2   Total Protein 6.5 - 8.1 g/dL 6.6     Total Bilirubin 0.3 - 1.2 mg/dL 0.4     Alkaline Phos 38 - 126 U/L 71     AST 15 - 41 U/L 13     ALT 0 - 44 U/L 10         RADIOGRAPHIC STUDIES: I have personally reviewed the radiological images as listed and agreed with the findings in the report. No results found.   ASSESSMENT & PLAN:  No problem-specific Assessment & Plan notes found for this encounter.   No orders of the defined types were placed in this encounter.  All questions were answered. The patient knows to call the clinic with any problems, questions or concerns. No barriers to learning was detected. I spent {CHL ONC TIME VISIT - DXIPJ:[id number]} counseling the patient face to face. The total time spent in the appointment was {CHL ONC TIME VISIT - HALPF:[id number]} and more than 50% was on counseling and review of test results     Alla Feeling, NP 04/27/22

## 2022-04-29 ENCOUNTER — Inpatient Hospital Stay: Payer: Medicare HMO

## 2022-04-29 ENCOUNTER — Inpatient Hospital Stay: Payer: Medicare HMO | Attending: Hematology and Oncology | Admitting: Nurse Practitioner

## 2022-04-29 ENCOUNTER — Other Ambulatory Visit: Payer: Self-pay

## 2022-04-29 ENCOUNTER — Encounter: Payer: Self-pay | Admitting: Nurse Practitioner

## 2022-04-29 VITALS — BP 148/53 | HR 60 | Temp 98.7°F | Resp 19 | Ht 64.5 in | Wt 199.3 lb

## 2022-04-29 DIAGNOSIS — I110 Hypertensive heart disease with heart failure: Secondary | ICD-10-CM | POA: Insufficient documentation

## 2022-04-29 DIAGNOSIS — Z90710 Acquired absence of both cervix and uterus: Secondary | ICD-10-CM | POA: Diagnosis not present

## 2022-04-29 DIAGNOSIS — Z171 Estrogen receptor negative status [ER-]: Secondary | ICD-10-CM

## 2022-04-29 DIAGNOSIS — J45909 Unspecified asthma, uncomplicated: Secondary | ICD-10-CM | POA: Diagnosis not present

## 2022-04-29 DIAGNOSIS — I509 Heart failure, unspecified: Secondary | ICD-10-CM | POA: Insufficient documentation

## 2022-04-29 DIAGNOSIS — I10 Essential (primary) hypertension: Secondary | ICD-10-CM | POA: Insufficient documentation

## 2022-04-29 DIAGNOSIS — C3411 Malignant neoplasm of upper lobe, right bronchus or lung: Secondary | ICD-10-CM | POA: Diagnosis not present

## 2022-04-29 DIAGNOSIS — Z79899 Other long term (current) drug therapy: Secondary | ICD-10-CM | POA: Diagnosis not present

## 2022-04-29 DIAGNOSIS — E559 Vitamin D deficiency, unspecified: Secondary | ICD-10-CM | POA: Insufficient documentation

## 2022-04-29 DIAGNOSIS — C50212 Malignant neoplasm of upper-inner quadrant of left female breast: Secondary | ICD-10-CM | POA: Diagnosis not present

## 2022-04-29 DIAGNOSIS — D649 Anemia, unspecified: Secondary | ICD-10-CM | POA: Insufficient documentation

## 2022-04-29 DIAGNOSIS — R5383 Other fatigue: Secondary | ICD-10-CM | POA: Diagnosis not present

## 2022-04-29 DIAGNOSIS — C50412 Malignant neoplasm of upper-outer quadrant of left female breast: Secondary | ICD-10-CM

## 2022-04-29 DIAGNOSIS — E039 Hypothyroidism, unspecified: Secondary | ICD-10-CM | POA: Insufficient documentation

## 2022-04-29 LAB — CBC WITH DIFFERENTIAL/PLATELET
Abs Immature Granulocytes: 0.02 10*3/uL (ref 0.00–0.07)
Basophils Absolute: 0.1 10*3/uL (ref 0.0–0.1)
Basophils Relative: 1 %
Eosinophils Absolute: 0.4 10*3/uL (ref 0.0–0.5)
Eosinophils Relative: 5 %
HCT: 32.1 % — ABNORMAL LOW (ref 36.0–46.0)
Hemoglobin: 10.3 g/dL — ABNORMAL LOW (ref 12.0–15.0)
Immature Granulocytes: 0 %
Lymphocytes Relative: 26 %
Lymphs Abs: 2.3 10*3/uL (ref 0.7–4.0)
MCH: 30.3 pg (ref 26.0–34.0)
MCHC: 32.1 g/dL (ref 30.0–36.0)
MCV: 94.4 fL (ref 80.0–100.0)
Monocytes Absolute: 0.8 10*3/uL (ref 0.1–1.0)
Monocytes Relative: 9 %
Neutro Abs: 5.3 10*3/uL (ref 1.7–7.7)
Neutrophils Relative %: 59 %
Platelets: 315 10*3/uL (ref 150–400)
RBC: 3.4 MIL/uL — ABNORMAL LOW (ref 3.87–5.11)
RDW: 13.2 % (ref 11.5–15.5)
WBC: 8.9 10*3/uL (ref 4.0–10.5)
nRBC: 0 % (ref 0.0–0.2)

## 2022-04-29 LAB — COMPREHENSIVE METABOLIC PANEL
ALT: 9 U/L (ref 0–44)
AST: 14 U/L — ABNORMAL LOW (ref 15–41)
Albumin: 4.2 g/dL (ref 3.5–5.0)
Alkaline Phosphatase: 64 U/L (ref 38–126)
Anion gap: 7 (ref 5–15)
BUN: 20 mg/dL (ref 8–23)
CO2: 27 mmol/L (ref 22–32)
Calcium: 9.3 mg/dL (ref 8.9–10.3)
Chloride: 106 mmol/L (ref 98–111)
Creatinine, Ser: 0.85 mg/dL (ref 0.44–1.00)
GFR, Estimated: >60 mL/min (ref >60)
Glucose, Bld: 102 mg/dL — ABNORMAL HIGH (ref 70–99)
Potassium: 3.5 mmol/L (ref 3.5–5.1)
Sodium: 140 mmol/L (ref 135–145)
Total Bilirubin: 0.5 mg/dL (ref 0.3–1.2)
Total Protein: 6.6 g/dL (ref 6.5–8.1)

## 2022-04-29 LAB — VITAMIN D 25 HYDROXY (VIT D DEFICIENCY, FRACTURES): Vit D, 25-Hydroxy: 23.35 ng/mL — ABNORMAL LOW (ref 30–100)

## 2022-04-30 ENCOUNTER — Telehealth: Payer: Self-pay | Admitting: Nurse Practitioner

## 2022-04-30 LAB — THYROID PANEL WITH TSH
Free Thyroxine Index: 2.2 (ref 1.2–4.9)
T3 Uptake Ratio: 31 % (ref 24–39)
T4, Total: 7.2 ug/dL (ref 4.5–12.0)
TSH: 2.26 u[IU]/mL (ref 0.450–4.500)

## 2022-04-30 NOTE — Telephone Encounter (Signed)
Left message with follow-up appointment per 7/11 los.

## 2022-05-01 ENCOUNTER — Telehealth: Payer: Self-pay | Admitting: Nurse Practitioner

## 2022-05-01 DIAGNOSIS — E559 Vitamin D deficiency, unspecified: Secondary | ICD-10-CM

## 2022-05-01 MED ORDER — CHOLECALCIFEROL 1.25 MG (50000 UT) PO TABS: 1.0000 | ORAL_TABLET | ORAL | 0 refills | Status: DC

## 2022-05-01 NOTE — Telephone Encounter (Signed)
Reviewed labs which show normal thyroid panel but low vit D: 23. Will stop oral daily dose and increase to 50,000 IU weekly until next visit then recheck level. Pt understands instructions and agrees. Appreciates the call. She knows to call sooner if symptoms worsen or fail to improve.   Cira Rue, NP

## 2022-05-07 ENCOUNTER — Ambulatory Visit (HOSPITAL_COMMUNITY)
Admission: RE | Admit: 2022-05-07 | Discharge: 2022-05-07 | Disposition: A | Discharge status: Home / Self Care | Payer: Medicare HMO | Source: Ambulatory Visit | Attending: Radiation Oncology | Admitting: Radiation Oncology

## 2022-05-07 DIAGNOSIS — Z0000 Encounter for general adult medical examination without abnormal findings: Secondary | ICD-10-CM | POA: Diagnosis not present

## 2022-05-07 DIAGNOSIS — E039 Hypothyroidism, unspecified: Secondary | ICD-10-CM | POA: Diagnosis not present

## 2022-05-07 DIAGNOSIS — R911 Solitary pulmonary nodule: Secondary | ICD-10-CM | POA: Diagnosis not present

## 2022-05-07 DIAGNOSIS — Z853 Personal history of malignant neoplasm of breast: Secondary | ICD-10-CM | POA: Diagnosis not present

## 2022-05-07 DIAGNOSIS — I700 Atherosclerosis of aorta: Secondary | ICD-10-CM | POA: Diagnosis not present

## 2022-05-07 DIAGNOSIS — E1169 Type 2 diabetes mellitus with other specified complication: Secondary | ICD-10-CM | POA: Diagnosis not present

## 2022-05-07 DIAGNOSIS — C3411 Malignant neoplasm of upper lobe, right bronchus or lung: Secondary | ICD-10-CM | POA: Diagnosis not present

## 2022-05-07 DIAGNOSIS — C3490 Malignant neoplasm of unspecified part of unspecified bronchus or lung: Secondary | ICD-10-CM | POA: Diagnosis not present

## 2022-05-07 DIAGNOSIS — I10 Essential (primary) hypertension: Secondary | ICD-10-CM | POA: Diagnosis not present

## 2022-05-07 DIAGNOSIS — C3491 Malignant neoplasm of unspecified part of right bronchus or lung: Secondary | ICD-10-CM | POA: Diagnosis not present

## 2022-05-07 DIAGNOSIS — E559 Vitamin D deficiency, unspecified: Secondary | ICD-10-CM | POA: Diagnosis not present

## 2022-05-09 ENCOUNTER — Other Ambulatory Visit: Payer: Self-pay

## 2022-05-09 ENCOUNTER — Encounter: Payer: Self-pay | Admitting: Radiation Oncology

## 2022-05-09 ENCOUNTER — Ambulatory Visit
Admission: RE | Admit: 2022-05-09 | Discharge: 2022-05-09 | Disposition: A | Discharge status: Home / Self Care | Payer: Medicare HMO | Source: Ambulatory Visit | Attending: Radiation Oncology | Admitting: Radiation Oncology

## 2022-05-09 VITALS — BP 150/60 | HR 57 | Temp 98.0°F | Resp 17 | Wt 191.1 lb

## 2022-05-09 DIAGNOSIS — Z79890 Hormone replacement therapy (postmenopausal): Secondary | ICD-10-CM | POA: Insufficient documentation

## 2022-05-09 DIAGNOSIS — I2510 Atherosclerotic heart disease of native coronary artery without angina pectoris: Secondary | ICD-10-CM | POA: Insufficient documentation

## 2022-05-09 DIAGNOSIS — Z79899 Other long term (current) drug therapy: Secondary | ICD-10-CM | POA: Insufficient documentation

## 2022-05-09 DIAGNOSIS — R918 Other nonspecific abnormal finding of lung field: Secondary | ICD-10-CM | POA: Diagnosis not present

## 2022-05-09 DIAGNOSIS — K449 Diaphragmatic hernia without obstruction or gangrene: Secondary | ICD-10-CM | POA: Insufficient documentation

## 2022-05-09 DIAGNOSIS — D649 Anemia, unspecified: Secondary | ICD-10-CM | POA: Diagnosis not present

## 2022-05-09 DIAGNOSIS — C3411 Malignant neoplasm of upper lobe, right bronchus or lung: Secondary | ICD-10-CM

## 2022-05-09 DIAGNOSIS — I700 Atherosclerosis of aorta: Secondary | ICD-10-CM | POA: Insufficient documentation

## 2022-05-09 NOTE — Progress Notes (Signed)
Radiation Oncology         [phone] ________________________________  Name: Torra Pala [place] MRN: [id number]  Date: 05/09/2022  DOB: 1935/02/11  Follow-Up Visit Note  Outpatient  CC: [name], MD  [name], MD  Diagnosis and Prior Radiotherapy:    ICD-10-CM   1. Malignant neoplasm of upper lobe of right lung [facility])  C34.11       Radiation Treatment Dates: 02/13/2021 through 02/20/2021 Site Technique Total Dose (Gy) Dose per Fx (Gy) Completed Fx Beam Energies  Lung, Right: [id number]_[id number] IMRT 54/54 18 3/3 [id number]   CHIEF COMPLAINT: Here for follow-up and surveillance of lung cancer  Narrative:  Ms.  Scronce presents today for follow-up after completing radiation to her right upper lung on 02/20/2021, and to receive CT scan results from 05/07/2022   PAIN: Denies any generalized or localized discomfort  RESPIRATORY: Patient denies any new symptoms of concerns.  SWALLOWING/DIET: Reports a moderate appetite.  Wt Readings from Last 3 Encounters:  05/09/22 191 lb 2 oz (86.7 kg)  04/29/22 199 lb 4.8 oz (90.4 kg)  04/14/22 197 lb 9.6 oz (89.6 kg)   OTHER: Continues to deal with fatigue, but recently found out she was vitaminD deficient. She has been started on a high dose vitaminD supplement and is hopeful that will resolve her symptoms. Otherwise reports she's feeling good and doing well. Thyroid function was normal.  She is slightly anemic.    ALLERGIES:  is allergic to amlodipine, benicar [olmesartan medoxomil], doxepin hcl, estradiol, gabapentin, levofloxacin, lisinopril, nebivolol hcl, olmesartan, tramadol hcl, penicillins, and sulfa antibiotics.  Meds: Current Outpatient Medications  Medication Sig Dispense Refill   acetaminophen (TYLENOL) 500 MG tablet Take 1,000 mg by mouth every 6 (six) hours as needed (pain).     albuterol (PROVENTIL HFA;VENTOLIN HFA) 108 (90 Base) MCG/ACT inhaler Inhale 1-2 puffs into the lungs every 6 (six) hours as needed for wheezing or  shortness of breath.      amLODipine (NORVASC) 10 MG tablet TAKE ONE TABLET BY MOUTH AT NOON 90 tablet 0   Artificial Tear Solution (SOOTHE XP OP) Place 1 drop into both eyes 2 (two) times daily as needed (dry eyes).     atenolol (TENORMIN) 25 MG tablet atenolol 25 mg tablet     Biotin 10000 MCG TABS Take by mouth daily at 12 noon.     cetaphil (CETAPHIL) cream Apply 1 application topically as needed (dry skin).     chlorthalidone (HYGROTON) 25 MG tablet TAKE ONE TABLET BY MOUTH AT NOON 90 tablet 1   Cholecalciferol 1.25 MG (50000 UT) TABS Take 1 tablet by mouth once a week. 12 tablet 0   Cholecalciferol 125 MCG (5000 UT) capsule 1 capsule     clindamycin (CLEOCIN) 300 MG capsule Take 600 mg by mouth See admin instructions. Take before dental appointments     diltiazem (CARDIZEM) 120 MG tablet diltiazem 120 mg tablet     doxepin (SINEQUAN) 25 MG capsule doxepin 25 mg capsule     fluticasone (FLONASE) 50 MCG/ACT nasal spray Place 2 sprays into both nostrils daily as needed for allergies.     furosemide (LASIX) 20 MG tablet Take 20 mg by mouth daily as needed for fluid or edema.     hydrochlorothiazide (HYDRODIURIL) 25 MG tablet hydrochlorothiazide 25 mg tablet     indapamide (LOZOL) 1.25 MG tablet indapamide 1.25 mg tablet     levothyroxine (SYNTHROID, LEVOTHROID) 88 MCG tablet Take 88 mcg by mouth daily before breakfast.  meclizine (ANTIVERT) 12.5 MG tablet Take 12.5 mg by mouth 3 (three) times daily as needed for dizziness.     methocarbamol (ROBAXIN) 500 MG tablet Take 1 tablet (500 mg total) by mouth every 6 (six) hours as needed for muscle spasms. 10 tablet 1   Multiple Vitamins-Minerals (PRESERVISION AREDS 2+MULTI VIT) CAPS Take 1 capsule by mouth in the morning and at bedtime.     potassium chloride SA (KLOR-CON) 20 MEQ tablet Take 20 mEq by mouth daily as needed (when taking furosemide).     Travoprost, BAK Free, (TRAVATAN) 0.004 % SOLN ophthalmic solution Travatan Z 0.004 % eye drops      valsartan (DIOVAN) 320 MG tablet Take 1 tablet (320 mg total) by mouth daily at 12 noon. Will need an appointment to for future refill 30 tablet 0   No current facility-administered medications for this encounter.    Physical Findings: The patient is in no acute distress. Patient is alert and oriented.  weight is 191 lb 2 oz (86.7 kg). Her oral temperature is 98 F (36.7 C). Her blood pressure is 150/60 (abnormal) and her pulse is 57 (abnormal). Her respiration is 17 and oxygen saturation is 100%. Marland Kitchen    HEART RRR upon ausculation CHEST CTAB OZD:[id number]    Lab Findings: Lab Results  Component Value Date   WBC 8.9 04/29/2022   HGB 10.3 (L) 04/29/2022   HCT 32.1 (L) 04/29/2022   MCV 94.4 04/29/2022   PLT 315 04/29/2022    Radiographic Findings:  CT Chest Wo Contrast  Result Date: 05/08/2022 CLINICAL DATA:  Non-small cell lung cancer restaging * Tracking Code: BO * EXAM: CT CHEST WITHOUT CONTRAST TECHNIQUE: Multidetector CT imaging of the chest was performed following the standard protocol without IV contrast. RADIATION DOSE REDUCTION: This exam was performed according to the departmental dose-optimization program which includes automated exposure control, adjustment of the mA and/or kV according to patient size and/or use of iterative reconstruction technique. COMPARISON:  01/08/2022, 09/05/2021 FINDINGS: Cardiovascular: Aortic atherosclerosis. Normal heart size. Left coronary artery calcifications. No pericardial effusion. Mediastinum/Nodes: No enlarged mediastinal, hilar, or axillary lymph nodes. Small hiatal hernia. Thyroid gland, trachea, and esophagus demonstrate no significant findings. Lungs/Pleura: Unchanged post treatment appearance of the apical right upper lobe with fibrosis and consolidation adjacent to fiducial markers (series 5, image 28). Nodule in the right upper lobe just inferior to this region is slightly increased in size, measuring 0.6 cm, previously 0.4 cm (series  5, image 40). Additional small adjacent nodules in the right lung stable, for example adjacent 0.6 cm nodules in the lateral segment right middle lobe (series 5, image 51) and a 0.5 cm nodule of the paramedian right lower lobe (series 5, image 80). No pleural effusion or pneumothorax. Upper Abdomen: No acute abnormality. Musculoskeletal: Status post left mastectomy. Interval resolution of a previously seen elongated fluid collection overlying the pectoralis musculature (series 2, image 81). No suspicious osseous lesions identified. IMPRESSION: 1. Unchanged post treatment appearance of the apical right upper lobe with fibrosis and consolidation adjacent to fiducial markers. 2. Nodule in the right upper lobe just inferior to this region is slightly increased in size, measuring 0.6 cm, previously 0.4 cm, and further increased in size compared to prior examination dated 09/05/2021 and worrisome for a small, enlarging metastasis or satellite nodule. 3. Additional small nodules in the right lung are stable and generally benign. Attention on follow-up. 4. Coronary artery disease. Aortic Atherosclerosis (ICD10-I70.0). Electronically Signed   By: Jamse Mead.D.  On: 05/08/2022 14:34    Impression/Plan: I have personally reviewed her CT chest images.   I shared the images with her.  There is a tiny nodule inferior to the right upper lobe SBRT site that has grown between interval scans.  I believe it is too small to work-up further with a PET scan.  It would be a difficult nodule to biopsy.  I recommend that we follow it with another scan in 3 months and if it continues to grow she may potentially get empiric radiation versus biopsy versus PET scan.  She is pleased with this plan.  I will see her back in October after imaging.  On date of service, in total, I spent 25 minutes on this encounter. Patient was seen in person.  _____________________________________   Eppie Gibson, MD

## 2022-05-09 NOTE — Progress Notes (Signed)
Alisha [name] presents today for follow-up after completing radiation to her right upper lung on 02/20/2021, and to receive CT scan results from 05/07/2022   PAIN: Denies any generalized or localized discomfort  RESPIRATORY: Patient denies any new symptoms of concerns.  SWALLOWING/DIET: Reports a moderate appetite.  Wt Readings from Last 3 Encounters:  05/09/22 191 lb 2 oz (86.7 kg)  04/29/22 199 lb 4.8 oz (90.4 kg)  04/14/22 197 lb 9.6 oz (89.6 kg)   OTHER: Continues to deal with fatigue, but recently found out she was vitaminD deficient. She has been started on a high dose vitaminD supplement and is hopeful that will resolve her symptoms. Otherwise reports she's feeling good and doing well.

## 2022-05-15 DIAGNOSIS — Z0000 Encounter for general adult medical examination without abnormal findings: Secondary | ICD-10-CM | POA: Diagnosis not present

## 2022-05-15 DIAGNOSIS — E785 Hyperlipidemia, unspecified: Secondary | ICD-10-CM | POA: Diagnosis not present

## 2022-05-15 DIAGNOSIS — I10 Essential (primary) hypertension: Secondary | ICD-10-CM | POA: Diagnosis not present

## 2022-05-15 DIAGNOSIS — E1169 Type 2 diabetes mellitus with other specified complication: Secondary | ICD-10-CM | POA: Diagnosis not present

## 2022-05-19 ENCOUNTER — Encounter: Payer: Self-pay | Admitting: Cardiology

## 2022-05-19 ENCOUNTER — Ambulatory Visit: Payer: Medicare HMO | Admitting: Cardiology

## 2022-05-19 VITALS — BP 132/60 | HR 54 | Ht 64.5 in | Wt 199.8 lb

## 2022-05-19 DIAGNOSIS — I5032 Chronic diastolic (congestive) heart failure: Secondary | ICD-10-CM | POA: Diagnosis not present

## 2022-05-19 DIAGNOSIS — R001 Bradycardia, unspecified: Secondary | ICD-10-CM | POA: Diagnosis not present

## 2022-05-19 DIAGNOSIS — I700 Atherosclerosis of aorta: Secondary | ICD-10-CM | POA: Diagnosis not present

## 2022-05-19 DIAGNOSIS — R5383 Other fatigue: Secondary | ICD-10-CM

## 2022-05-19 DIAGNOSIS — I110 Hypertensive heart disease with heart failure: Secondary | ICD-10-CM | POA: Diagnosis not present

## 2022-05-19 NOTE — Progress Notes (Signed)
Primary Care Provider: Farris Has, MD Cardiologist: Bryan Lemma, MD Electrophysiologist: None Oncologist: Rachel Moulds, MD; Santiago Glad, NP Rad Onc: Lonie Peak, MD Pulmonologist: Levy Pupa General Surgeon: Almond Lint, MD   Clinic Note: Chief Complaint  Patient presents with  . Follow-up    BP levels look good.  . Fatigue    Energy level seems to be better since starting vitamin D.  Oncology wanted to consider the possibility of sleep study.   ===================================  ASSESSMENT/PLAN   Problem List Items Addressed This Visit       Cardiology Problems   Hypertensive heart disease with chronic diastolic congestive heart failure (HCC) - Primary (Chronic)    BP looks much better controlled today with pressures 130s over 60s.  She is probably on a stable regimen.  She is still on atenolol which  54.  Fatigue will be something to consider stopping if symptoms of fatigue continue once she is seen in follow-up.  If we did that, will probably need to add additional meds.      Hardening of the aorta (main artery of the heart) (HCC) (Chronic)    Aortic atherosclerosis noted on CT scan.  Continue blood pressure control.  Not on statin given advanced age and concerns for fatigue.        Other   Fatigue    Pretty much multifactorial with deconditioning, obesity, bradycardia and likely vitamin D deficiency.  She also has fatigue from ongoing illnesses.  I am sure that her radiation therapy is not helping either.  Continue to monitor since she feels better after starting vitamin D replacement.  From a cardiac standpoint I think may be potentially coming off beta-blocker would be an option.  Would defer sleep study evaluation to medicine.  Sound like she has symptoms consistent with sleep apnea.      Bradycardia with 41-50 beats per minute (Chronic)    Heart rate seems a little better today.  Continue to monitor.  If symptoms of fatigue persist, would  probably consider stopping beta-blocker, or at least switching to a less heart rate related beta-blocker such as Bystolic.      ===================================  HPI:    Alisha [name] is a 87 y.o. female with a PMH notable for Hypertensive Heart Disease with Diastolic Dysfunction (minimal HFpEF), current Left Breast Cancer (see below), bradycardia and chronic fatigue who presents today for delayed annual follow-up. She is seen today at the request of Farris Has, MD. \  I most recently saw her in January 2022 shortly after being seen by Malena Peer, RPH-CCP for BP management.  She had noted having a itching and rash after titrating up her amlodipine to 5 mg.  Seem to be resolving.-Started on spironolactone. => I increased spironolactone to 25 mg daily and had her reduce valsartan and to 20 mg. No AV nodal agents.  No signs of chronotropic incompetence.  Alisha [name] was last seen on April 18, 2021 by Malena Peer, RPH-CCP for BP follow-up: At that time she is on chlorthalidone 25 mg milligram, valsartan 320 mg and PRN Lasix.  Noted that her BP levels are usually in the 130s/80s 80s at home.  Averaging pressures of 110-130s/50s follow-up visit.  Recent Hospitalizations:  11/26/2021: Left Mastectomy:   She was recently seen by Michaele Offer, NP from Medical Oncology for routine follow-up.  She had noted fatigue and her visit back in June with Dr. Al Pimple.  She had noted persistent fatigue with fluctuating severity.  This been  ongoing since her diagnosis with cancer back in 2020.  Has good days and bad days.  Gets good sleep.  Eating and drinking well.  Occasionally taking Lasix.  Usually takes her inhaler in the morning after walking her dog. => Had been started on high-dose vitamin D 5000 IU daily.  Thyroid level checked along with vitamin D. Recent surveillance CT scan on 05/07/2022 Question the possibility of sleep study although there is no sign of poor sleep, restless sleep  or snoring.  Reviewed  CV studies:    The following studies were reviewed today: (if available, images/films reviewed: From Epic Chart or Care Everywhere) CT Chest W/o Contrast: Coronary and aortic calcification noted.  Unchanged postop appearance.  Right upper lobe nodule increased from 0.4 to 0.6 cm.  Worrisome for possible satellite nodule.  Small nodules in the right neck stable.   Interval History:   Alisha [name] is here today for follow-up stating that she is doing okay overall for cardiac standpoint.  Her fatigue is improving but still ongoing.  She denies any active anginal heart failure type symptoms.  No PND orthopnea no active chest pain or pressure at rest exertion.  Just exertional dyspnea.  Notes occasional palpitations-nothing prolonged or fast.  Just intermittent skipped beats. Not really exercising a lot much.  Has been quite out of shape from fatigue.  All of the fatigue is improving dramatically after starting vitamin D.  She has not noticed specifically having decreased "get up and go over inability to maintain exertion to suggest chronotropic competence. She sleeps well and does not feel unrested when she wakes up, she just has chronic.  She rarely takes the Lozol maybe once or twice a week but had not been using it of late.  No longer on HCTZ.  She is now back on chlorthalidone as opposed to spironolactone.  She has not taken full dose of valsartan 320 mg daily along with atenolol 25 and amlodipine 10 mg.  Not very active but trying to get more exercise.  We reviewed medications that were seen the very confusing.  She had amlodipine and diltiazem listed but is not taking diltiazem.  She had Lozol and HCTZ listed along with chlorthalidone and is only taking chlorthalidone.  She also has PRN Lasix.  Edema is pretty well controlled with the Lasix that she takes rarely maybe once every 2 weeks.  She occasionally feels lightheaded but not really a lot maybe once or twice  a week usually happens in the morning when she first wakes up. No syncope/near syncope, TIA or amaurosis fugax symptoms.  No claudication.  REVIEWED OF SYSTEMS   Review of Systems  Constitutional:  Positive for malaise/fatigue (Still quite deconditioned. Fatigue seems to be doing better since starting vitamin D supplementation.Marland Kitchen). Negative for chills, fever and weight loss.  HENT:  Negative for nosebleeds.   Respiratory: Negative.    Cardiovascular:        Per HPI  Gastrointestinal:  Negative for blood in stool and melena.  Genitourinary:  Negative for hematuria.  Musculoskeletal:  Positive for joint pain. Negative for falls.  Neurological:  Positive for dizziness (Per HPI). Negative for focal weakness and weakness.  Psychiatric/Behavioral: Negative.         She really seems to be handling recurrent malignancy without complaints of change in her mood.   I have reviewed and (if needed) personally updated the patient's problem list, medications, allergies, past medical and surgical history, social and family history.   PAST  MEDICAL HISTORY   Past Medical History:  Diagnosis Date  . Arthritis   . Asthma    Never smoker  . Blood transfusion    over 40 y ears  . Cancer ([place])   . History of radiation therapy 09/07/19- 10/06/19   Left Breast 15 fractions of 2.67 Gy to total 40.05 Gy. Left breast boost 5 fractions of 2 Gy each to total 10 Gy  . Hypertension    on meds  . Hypothyroidism    on meds  . Malignant neoplasm of left breast metastatic to lung ([place]) 06/22/2019   a) 9/'20 ->  Upper-Outer Quadrant of Left Breast: Clinical stage from 06/22/2019: Stage IB (cT1b, cN0, cM0, G3, ER-, PR-, HER2-) => L Lumpectomy & XRT - (f/u Path 11/21: Stage 1B (pT1b, pN0,cM0; 1/'23: Recurrent Upper-Inner Quadrant ( Clinical Stage cT1, cN0, cM0) -mastectomy; now treated with XRT.  . Malignant neoplasm of right upper lobe of lung ([place]) 01/23/2021   Stage IB (cT1c, cN0, cM0, G2, ER-, PR-, HER2-  .  Personal history of radiation therapy   . Pre-diabetes    06/22/2019: Malignant neoplasm of Upper-Outer Quadrant of Left Breast: Clinical stage from 06/22/2019: Stage IB (cT1b, cN0, cM0, G3, ER-, PR-, HER2-) 07/27/2019 left lumpectomy with clear margins, 2 lymph nodes negative. Ki67 ~30% 08/26/20: Path Stage 1B (pT1b, pN0, M0, ER, PR, HER2 negative) 11-09/2019: XRT  01/23/2021: Malignant neoplasm of Lung - [facility]UL: Clinical Stage cT1, cN0, cM0:  10/29/21: Recurrent malignant neoplasm now and Upper-Inner Quadrant: Stage IB (cT1c, cN0, cM0, G2, ER-, PR-, HER2- 11/26/2021 Left Breast Total Mastectomy With Sentinel Node Biopsy XRT  PAST SURGICAL HISTORY   Past Surgical History:  Procedure Laterality Date  . ABDOMINAL HYSTERECTOMY     32 yrs  . AXILLARY SENTINEL NODE BIOPSY Left 11/26/2021   Procedure: LEFT AXILLARY SENTINEL NODE BIOPSY;  Surgeon: Stark Klein, MD;  Location: [facility];  Service: General;  Laterality: Left;  . BREAST LUMPECTOMY Left 08/11/2016  . BREAST LUMPECTOMY Left 07/27/2019  . BREAST LUMPECTOMY WITH RADIOACTIVE SEED AND SENTINEL LYMPH NODE BIOPSY Left 07/27/2019   Procedure: LEFT BREAST LUMPECTOMY WITH RADIOACTIVE SEED AND LEFT SENTINEL LYMPH NODE MAPPING;  Surgeon: Erroll Luna, MD;  Location: [place];  Service: General;  Laterality: Left;  . BREAST LUMPECTOMY WITH RADIOACTIVE SEED LOCALIZATION Left 08/11/2016   Procedure: LEFT BREAST LUMPECTOMY WITH RADIOACTIVE SEED LOCALIZATION;  Surgeon: Erroll Luna, MD;  Location: [facility];  Service: General;  Laterality: Left;  . BRONCHIAL BIOPSY  12/27/2020   Procedure: BRONCHIAL BIOPSIES;  Surgeon: Collene Gobble, MD;  Location: [facility] ENDOSCOPY;  Service: Pulmonary;;  . BRONCHIAL BRUSHINGS  12/27/2020   Procedure: BRONCHIAL BRUSHINGS;  Surgeon: Collene Gobble, MD;  Location: [facility] ENDOSCOPY;  Service: Pulmonary;;  . BRONCHIAL NEEDLE ASPIRATION BIOPSY  12/27/2020   Procedure: BRONCHIAL NEEDLE ASPIRATION BIOPSIES;  Surgeon: Collene Gobble, MD;  Location: [place];  Service: Pulmonary;;  . BRONCHIAL WASHINGS  12/27/2020   Procedure: BRONCHIAL WASHINGS;  Surgeon: Collene Gobble, MD;  Location: [facility] ENDOSCOPY;  Service: Pulmonary;;  . CATARACT EXTRACTION, BILATERAL    . CORONARY CALCIUM SCORE-CT ANGIOGRAM  04/2019   Small, < 10mm nodule noted.  Difficult study to read.  Coronary calcium score was 11.  Significant motion artifact, but visible territory showed minimal disease.  Minimal calcium noted in the ostial LAD but no suggestion of significant CAD.  Marland Kitchen EYE SURGERY    . FIDUCIAL MARKER PLACEMENT  12/27/2020   Procedure: FIDUCIAL  MARKER PLACEMENT;  Surgeon: Collene Gobble, MD;  Location: [facility] ENDOSCOPY;  Service: Pulmonary;;  . JOINT REPLACEMENT Bilateral    left knee 2012  . KNEE ARTHROPLASTY  02/17/2012   Procedure: COMPUTER ASSISTED TOTAL KNEE ARTHROPLASTY;  Surgeon: Meredith Pel, MD;  Location: [place];  Service: Orthopedics;  Laterality: Right;  Right total knee replacement  . MASTECTOMY W/ SENTINEL NODE BIOPSY Left 11/26/2021   Procedure: LEFT BREAST MASTECTOMY;  Surgeon: Stark Klein, MD;  Location: [place];  Service: General;  Laterality: Left;  . TRANSTHORACIC ECHOCARDIOGRAM  04/02/2020   Normal LV function.  No R WMA.  Moderate LVH.  GRII DD with elevated filling pressures.  Moderate LA dilation with trivial MR.  Mild aortic valve sclerosis.  Marland Kitchen VIDEO BRONCHOSCOPY WITH ENDOBRONCHIAL NAVIGATION N/A 12/27/2020   Procedure: VIDEO BRONCHOSCOPY WITH ENDOBRONCHIAL NAVIGATION;  Surgeon: Collene Gobble, MD;  Location: [place] ENDOSCOPY;  Service: Pulmonary;  Laterality: N/A;    Immunization History  Administered Date(s) Administered  . DTP 08/04/2001  . Influenza Split 07/30/2008, 08/13/2009, 08/25/2011, 08/19/2012, 07/27/2013, 09/26/2019, 09/17/2020  . Influenza, High Dose Seasonal PF 09/26/2019  . Influenza,inj,Quad PF,6+ Mos 08/28/2016, 09/04/2017, 10/08/2018  . Influenza,inj,quad, With Preservative 09/06/2014,  10/19/2015  . Moderna SARS-COV2 Booster Vaccination 04/03/2021  . PFIZER(Purple Top)SARS-COV-2 Vaccination 12/19/2019, 01/09/2020, 08/14/2020  . Pneumococcal Conjugate-13 02/22/2014  . Pneumococcal Polysaccharide-23 08/05/2003  . Zoster Recombinat (Shingrix) 08/28/2017  . Zoster, Live 03/02/2017, 08/28/2017    MEDICATIONS/ALLERGIES   Current Meds  Medication Sig  . acetaminophen (TYLENOL) 500 MG tablet Take 1,000 mg by mouth every 6 (six) hours as needed (pain).  Marland Kitchen albuterol (PROVENTIL HFA;VENTOLIN HFA) 108 (90 Base) MCG/ACT inhaler Inhale 1-2 puffs into the lungs every 6 (six) hours as needed for wheezing or shortness of breath.   Marland Kitchen amLODipine (NORVASC) 10 MG tablet TAKE ONE TABLET BY MOUTH AT NOON  . Artificial Tear Solution (SOOTHE XP OP) Place 1 drop into both eyes 2 (two) times daily as needed (dry eyes).  Marland Kitchen atenolol (TENORMIN) 25 MG tablet atenolol 25 mg tablet  . Biotin 10000 MCG TABS Take by mouth daily at 12 noon.  . cetaphil (CETAPHIL) cream Apply 1 application topically as needed (dry skin).  . chlorthalidone (HYGROTON) 25 MG tablet TAKE ONE TABLET BY MOUTH AT NOON  . Cholecalciferol 1.25 MG (50000 UT) TABS Take 1 tablet by mouth once a week.  . clindamycin (CLEOCIN) 300 MG capsule Take 600 mg by mouth See admin instructions. Take before dental appointments  . diltiazem (CARDIZEM) 120 MG tablet diltiazem 120 mg tablet  . doxepin (SINEQUAN) 25 MG capsule doxepin 25 mg capsule  . fluticasone (FLONASE) 50 MCG/ACT nasal spray Place 2 sprays into both nostrils daily as needed for allergies.  . furosemide (LASIX) 20 MG tablet Take 20 mg by mouth daily as needed for fluid or edema.  . hydrochlorothiazide (HYDRODIURIL) 25 MG tablet hydrochlorothiazide 25 mg tablet  . indapamide (LOZOL) 1.25 MG tablet indapamide 1.25 mg tablet  . levothyroxine (SYNTHROID, LEVOTHROID) 88 MCG tablet Take 88 mcg by mouth daily before breakfast.   . meclizine (ANTIVERT) 12.5 MG tablet Take 12.5 mg by  mouth 3 (three) times daily as needed for dizziness.  . methocarbamol (ROBAXIN) 500 MG tablet Take 1 tablet (500 mg total) by mouth every 6 (six) hours as needed for muscle spasms.  . Multiple Vitamins-Minerals (PRESERVISION AREDS 2+MULTI VIT) CAPS Take 1 capsule by mouth in the morning and at bedtime.  . potassium chloride SA (KLOR-CON) 20 MEQ  tablet Take 20 mEq by mouth daily as needed (when taking furosemide).  . Travoprost, BAK Free, (TRAVATAN) 0.004 % SOLN ophthalmic solution Travatan Z 0.004 % eye drops  . valsartan (DIOVAN) 320 MG tablet Take 1 tablet (320 mg total) by mouth daily at 12 noon. Will need an appointment to for future refill    Allergies  Allergen Reactions  . Amlodipine Other (See Comments)    Other reaction(s): swelling, ha, itching  . Benicar [Olmesartan Medoxomil] Other (See Comments)  . Doxepin Hcl     Other reaction(s): Body swelling  . Estradiol     Other reaction(s): itching  . Gabapentin     Other reaction(s): swelling  . Levofloxacin     Other reaction(s): Unknown  . Lisinopril Itching    No energy Other reaction(s): itching, hair loss  . Nebivolol Hcl     Other reaction(s): fatigue  . Olmesartan     Other reaction(s): hair loss, nail changes  . Tramadol Hcl     Other reaction(s): abdominal pain  . Penicillins Itching and Rash    Has patient had a PCN reaction causing immediate rash, facial/tongue/throat swelling, SOB or lightheadedness with hypotension: No Has patient had a PCN reaction causing severe rash involving mucus membranes or skin necrosis: No Has patient had a PCN reaction that required hospitalization: No Has patient had a PCN reaction occurring within the last 10 years: No If all of the above answers are "NO", then may proceed with Cephalosporin use.  . Sulfa Antibiotics Itching and Rash    Other reaction(s): Unknown    SOCIAL HISTORY/FAMILY HISTORY   Reviewed in Epic:  Pertinent findings:  Social History   Tobacco Use  .  Smoking status: Never  . Smokeless tobacco: Never  Vaping Use  . Vaping Use: Never used  Substance Use Topics  . Alcohol use: No  . Drug use: No   Social History   Social History Narrative   No longer exercising - since "got tired" - previously did elliptical, exercise bike etc.    While working - used worked gym      Diet: Continues to work on her diet - enjoys vegetables, beans, chicken, fish, occasional homemade burger (1x/mo), salad - does not add salt to food       Drinks: 1 cup of coffee/day, 6 8-oz glasses of water per day        Exercise: Increased exercise to 30 minutes per day - using weights, exercise bike, and treadmill    OBJCTIVE -PE, EKG, labs   Wt Readings from Last 3 Encounters:  05/19/22 199 lb 12.8 oz (90.6 kg)  05/09/22 191 lb 2 oz (86.7 kg)  04/29/22 199 lb 4.8 oz (90.4 kg)    Physical Exam: BP 132/60   Pulse (!) 54   Ht 5' 4.5" (1.638 m)   Wt 199 lb 12.8 oz (90.6 kg)   SpO2 99%   BMI 33.77 kg/m  Physical Exam Vitals reviewed.  Constitutional:      General: She is not in acute distress.    Appearance: Normal appearance. She is obese. She is not ill-appearing (Appears to be relatively healthy.  Younger than stated age.  Well-nourished and well-groomed.) or toxic-appearing.  HENT:     Head: Normocephalic and atraumatic.  Neck:     Vascular: Normal carotid pulses. No carotid bruit, hepatojugular reflux or JVD.  Cardiovascular:     Rate and Rhythm: Regular rhythm. Bradycardia present. No extrasystoles are present.    Chest  Wall: PMI is not displaced (Unable to palpate).     Pulses: Intact distal pulses. Decreased pulses (Difficult to palpate, but in tact).     Heart sounds: S1 normal and S2 normal. Heart sounds are distant. Murmur (1/6 harsh SEM@ RUSB-Neck.) heard.  Pulmonary:     Effort: Pulmonary effort is normal. No respiratory distress.     Breath sounds: Wheezing present.     Comments: Distant breath sounds with no obvious rales or rhonchi.   Faint bilateral end expiratory wheeze.  Patient also sounds in the right upper quadrant Chest:     Chest wall: No tenderness.  Musculoskeletal:        General: Swelling (Revealed bilateral ankle swelling.) present. Normal range of motion.     Cervical back: Normal range of motion and neck supple.  Skin:    General: Skin is warm and dry.  Neurological:     General: No focal deficit present.     Mental Status: She is alert and oriented to person, place, and time.  Psychiatric:        Mood and Affect: Mood normal.        Behavior: Behavior normal.        Thought Content: Thought content normal.        Judgment: Judgment normal.     Comments: Remains in good spirits    Adult ECG Report N/a  Recent Labs:  reviewed  No results found for: "CHOL", "HDL", "LDLCALC", "LDLDIRECT", "TRIG", "CHOLHDL" Lab Results  Component Value Date   CREATININE 0.85 04/29/2022   BUN 20 04/29/2022   NA 140 04/29/2022   K 3.5 04/29/2022   CL 106 04/29/2022   CO2 27 04/29/2022      Latest Ref Rng & Units 04/29/2022   11:11 AM 04/14/2022   11:50 AM 11/27/2021    3:21 AM  CBC  WBC 4.0 - 10.5 K/uL 8.9  10.0  12.9   Hemoglobin 12.0 - 15.0 g/dL 10.3  10.6  9.6   Hematocrit 36.0 - 46.0 % 32.1  32.8    32.0  30.3   Platelets 150 - 400 K/uL 315  324  256     Lab Results  Component Value Date   HGBA1C 5.3 11/22/2021   Lab Results  Component Value Date   TSH 2.260 04/29/2022    ================================================== I spent a total of 21 minutes with the patient spent in direct patient consultation.  Additional time spent with chart review  / charting (studies, outside notes, etc): 15 min Total Time: 36 min  Current medicines are reviewed at length with the patient today.  (+/- concerns) none  Notice: This dictation was prepared with Dragon dictation along with smart phrase technology. Any transcriptional errors that result from this process are unintentional and may not be corrected upon  review.  Studies Ordered:   No orders of the defined types were placed in this encounter.  No orders of the defined types were placed in this encounter.   Patient Instructions / Medication Changes & Studies & Tests Ordered   Patient Instructions  Medication Instructions:     No changes  until you call the office to give the name of the medication you are taking   *If you need a refill on your cardiac medications before your next appointment, please call your pharmacy*   Lab Work:   Not needed  Testing/Procedures:  Not needed  Follow-Up: At [facility], you and your health needs are our priority.  As  part of our continuing mission to provide you with exceptional heart care, we have created designated Provider Care Teams.  These Care Teams include your primary Cardiologist (physician) and Advanced Practice Providers (APPs -  Physician Assistants and Nurse Practitioners) who all work together to provide you with the care you need, when you need it.     Your next appointment:   6 month(s)  The format for your next appointment:   In Person  Provider:   Diona Browner, NP    Then, Glenetta Hew, MD will plan to see you again in 12 month(s).        Delane Ginger, M.D., M.S. Interventional Cardiologist  [place]  Pager # [phone] Phone # [phone] [address]. [place], [place] [address]   Thank you for choosing [facility]!!

## 2022-05-19 NOTE — Patient Instructions (Addendum)
Medication Instructions:     No changes  until you call the office to give the name of the medication you are taking   *If you need a refill on your cardiac medications before your next appointment, please call your pharmacy*   Lab Work:   Not needed  Testing/Procedures:  Not needed  Follow-Up: At [facility], you and your health needs are our priority.  As part of our continuing mission to provide you with exceptional heart care, we have created designated Provider Care Teams.  These Care Teams include your primary Cardiologist (physician) and Advanced Practice Providers (APPs -  Physician Assistants and Nurse Practitioners) who all work together to provide you with the care you need, when you need it.     Your next appointment:   6 month(s)  The format for your next appointment:   In Person  Provider:   Diona Browner, NP    Then, Glenetta Hew, MD will plan to see you again in 12 month(s).

## 2022-06-10 DIAGNOSIS — E785 Hyperlipidemia, unspecified: Secondary | ICD-10-CM | POA: Diagnosis not present

## 2022-06-10 DIAGNOSIS — M1990 Unspecified osteoarthritis, unspecified site: Secondary | ICD-10-CM | POA: Diagnosis not present

## 2022-06-10 DIAGNOSIS — J45998 Other asthma: Secondary | ICD-10-CM | POA: Diagnosis not present

## 2022-06-10 DIAGNOSIS — E1169 Type 2 diabetes mellitus with other specified complication: Secondary | ICD-10-CM | POA: Diagnosis not present

## 2022-06-10 DIAGNOSIS — I10 Essential (primary) hypertension: Secondary | ICD-10-CM | POA: Diagnosis not present

## 2022-06-10 DIAGNOSIS — E039 Hypothyroidism, unspecified: Secondary | ICD-10-CM | POA: Diagnosis not present

## 2022-06-14 ENCOUNTER — Encounter: Payer: Self-pay | Admitting: Cardiology

## 2022-06-14 NOTE — Assessment & Plan Note (Signed)
BP looks much better controlled today with pressures 130s over 60s.  She is probably on a stable regimen.  She is still on atenolol which  54.  Fatigue will be something to consider stopping if symptoms of fatigue continue once she is seen in follow-up.  If we did that, will probably need to add additional meds.

## 2022-06-14 NOTE — Assessment & Plan Note (Signed)
Heart rate seems a little better today.  Continue to monitor.  If symptoms of fatigue persist, would probably consider stopping beta-blocker, or at least switching to a less heart rate related beta-blocker such as Bystolic.

## 2022-06-14 NOTE — Assessment & Plan Note (Signed)
Pretty much multifactorial with deconditioning, obesity, bradycardia and likely vitamin D deficiency.  She also has fatigue from ongoing illnesses.  I am sure that her radiation therapy is not helping either.  Continue to monitor since she feels better after starting vitamin D replacement.  From a cardiac standpoint I think may be potentially coming off beta-blocker would be an option.  Would defer sleep study evaluation to medicine.  Sound like she has symptoms consistent with sleep apnea.

## 2022-06-14 NOTE — Assessment & Plan Note (Signed)
Aortic atherosclerosis noted on CT scan.  Continue blood pressure control.  Not on statin given advanced age and concerns for fatigue.

## 2022-07-02 ENCOUNTER — Encounter: Payer: Self-pay | Admitting: Podiatry

## 2022-07-02 ENCOUNTER — Ambulatory Visit: Payer: Medicare HMO | Admitting: Podiatry

## 2022-07-02 DIAGNOSIS — G6289 Other specified polyneuropathies: Secondary | ICD-10-CM | POA: Diagnosis not present

## 2022-07-02 DIAGNOSIS — M79674 Pain in right toe(s): Secondary | ICD-10-CM | POA: Diagnosis not present

## 2022-07-02 DIAGNOSIS — N760 Acute vaginitis: Secondary | ICD-10-CM | POA: Insufficient documentation

## 2022-07-02 DIAGNOSIS — B351 Tinea unguium: Secondary | ICD-10-CM | POA: Diagnosis not present

## 2022-07-02 DIAGNOSIS — M79675 Pain in left toe(s): Secondary | ICD-10-CM | POA: Diagnosis not present

## 2022-07-02 DIAGNOSIS — Q828 Other specified congenital malformations of skin: Secondary | ICD-10-CM | POA: Diagnosis not present

## 2022-07-02 DIAGNOSIS — G629 Polyneuropathy, unspecified: Secondary | ICD-10-CM | POA: Diagnosis not present

## 2022-07-06 NOTE — Progress Notes (Signed)
Subjective:  Patient ID: Alisha [name], female    DOB: 01-25-1935,  MRN: [id number]  Alisha Roys Posch presents to clinic today for at risk foot care with history of peripheral neuropathy and painful porokeratotic lesion(s) right lower extremity and painful mycotic toenails that limit ambulation. Painful toenails interfere with ambulation. Aggravating factors include wearing enclosed shoe gear. Pain is relieved with periodic professional debridement. Painful porokeratotic lesions are aggravated when weightbearing with and without shoegear. Pain is relieved with periodic professional debridement.  New problem(s): None.   PCP is [name], MD , and last visit was May 15, 2022.  Allergies  Allergen Reactions   Amlodipine Other (See Comments)    Other reaction(s): swelling, ha, itching   Benicar [Olmesartan Medoxomil] Other (See Comments)   Doxepin Hcl     Other reaction(s): Body swelling   Estradiol     Other reaction(s): itching   Gabapentin     Other reaction(s): swelling   Levofloxacin     Other reaction(s): Unknown   Lisinopril Itching    No energy Other reaction(s): itching, hair loss   Nebivolol Hcl     Other reaction(s): fatigue   Olmesartan     Other reaction(s): hair loss, nail changes   Tramadol Hcl     Other reaction(s): abdominal pain   Penicillins Itching and Rash    Has patient had a PCN reaction causing immediate rash, facial/tongue/throat swelling, SOB or lightheadedness with hypotension: No Has patient had a PCN reaction causing severe rash involving mucus membranes or skin necrosis: No Has patient had a PCN reaction that required hospitalization: No Has patient had a PCN reaction occurring within the last 10 years: No If all of the above answers are "NO", then may proceed with Cephalosporin use.   Sulfa Antibiotics Itching and Rash    Other reaction(s): Unknown    Review of Systems: Negative except as noted in the HPI. Objective:    Constitutional Lucretia Viola Lemmons is a pleasant 87 y.o. African American female, WD, WN in NAD. AAO x 3.   Vascular CFT <3 seconds b/l LE. Palpable pedal pulses b/l LE. Pedal hair sparse. No pain with calf compression b/l. No edema noted b/l LE. No cyanosis or clubbing noted b/l LE.  Neurologic Normal speech. Oriented to person, place, and time. Pt has subjective symptoms of neuropathy.  Dermatologic Pedal skin warm and supple b/l.  No open wounds b/l. No interdigital macerations. Toenails 1-5 b/l elongated, thickened, discolored with subungual debris. +Tenderness with dorsal palpation of nailplates. Porokeratotic lesion(s) noted submet head 5 right foot.  Orthopedic: Muscle strength 5/5 to all lower extremity muscle groups bilaterally. No pain, crepitus or joint limitation noted with ROM bilateral LE. Hammertoe(s) noted to the left great toe, L 2nd toe, and L 3rd toe.   Radiographs: None  Last A1c:     Latest Ref Rng & Units 11/22/2021    2:25 PM  Hemoglobin A1C  Hemoglobin-A1c 4.8 - 5.6 % 5.3    Assessment:   1. Pain due to onychomycosis of toenails of both feet   2. Porokeratosis   3. Neuropathy    Plan:  Patient was evaluated and treated and all questions answered. Consent given for treatment as described below: -Examined patient. -No new findings. No new orders. -Toenails 1-5 b/l were debrided in length and girth with sterile nail nippers and dremel without iatrogenic bleeding.  -Porokeratotic lesion(s) submet head 5 right foot pared and enucleated with sterile currette without incident. Total number of lesions  debrided=1. -Patient/POA to call should there be question/concern in the interim.  Return in about 3 months (around 10/01/2022).  Marzetta Board, DPM

## 2022-07-09 ENCOUNTER — Other Ambulatory Visit: Payer: Self-pay | Admitting: Cardiology

## 2022-07-14 DIAGNOSIS — J45909 Unspecified asthma, uncomplicated: Secondary | ICD-10-CM | POA: Diagnosis not present

## 2022-07-14 DIAGNOSIS — J45998 Other asthma: Secondary | ICD-10-CM | POA: Diagnosis not present

## 2022-07-14 DIAGNOSIS — E785 Hyperlipidemia, unspecified: Secondary | ICD-10-CM | POA: Diagnosis not present

## 2022-07-14 DIAGNOSIS — I10 Essential (primary) hypertension: Secondary | ICD-10-CM | POA: Diagnosis not present

## 2022-07-14 DIAGNOSIS — M1990 Unspecified osteoarthritis, unspecified site: Secondary | ICD-10-CM | POA: Diagnosis not present

## 2022-07-14 DIAGNOSIS — E039 Hypothyroidism, unspecified: Secondary | ICD-10-CM | POA: Diagnosis not present

## 2022-07-16 ENCOUNTER — Telehealth: Payer: Self-pay | Admitting: *Deleted

## 2022-07-16 NOTE — Telephone Encounter (Signed)
CALLED PATIENT TO INFORM OF CT FOR 08-09-22 @ [facility], - ARRIVAL TIME- 12:30 PM, PATIENT TO CHECK IN @ ED @ [facility], NO RESTRICTIONS TO TEST, PATIENT TO RECEIVE RESULTS FROM DR. SQUIRE ON 08-12-22 @ 2:40 PM, SPOKE WITH PATIENT AND SHE IS AWARE OF THESE APPTS. AND THE INSTRUCTIONS

## 2022-07-18 DIAGNOSIS — Z23 Encounter for immunization: Secondary | ICD-10-CM | POA: Diagnosis not present

## 2022-08-06 NOTE — Progress Notes (Signed)
[name] presents today for follow-up after completing radiation to her right upper lung on 02/20/2021, and to receive CT scan results from 08/09/22  PAIN: Patient denies pain.     RESPIRATORY: Patient denies any sob. Patient states that she has dry skin . States that her skin is also itching alot  SWALLOWING/DIET: Pt denies dysphagia .   OTHER: Pt complains of fatigue and poor appetite.  Vitals:   08/12/22 1450  BP: (!) (P) 166/50  Pulse: (P) 62  Resp: (P) 18  Temp: (!) (P) 96.8 F (36 C)  TempSrc: (P) Temporal  SpO2: (P) 100%  Weight: (P) 89.9 kg  Height: (P) 5' 4.5" (1.638 m)     Wt Readings from Last 3 Encounters:  05/19/22 90.6 kg  05/09/22 86.7 kg  04/29/22 90.4 kg

## 2022-08-09 ENCOUNTER — Ambulatory Visit (HOSPITAL_COMMUNITY)
Admission: RE | Admit: 2022-08-09 | Discharge: 2022-08-09 | Disposition: A | Discharge status: Home / Self Care | Payer: Medicare HMO | Source: Ambulatory Visit | Attending: Radiation Oncology | Admitting: Radiation Oncology

## 2022-08-09 DIAGNOSIS — R911 Solitary pulmonary nodule: Secondary | ICD-10-CM | POA: Diagnosis not present

## 2022-08-09 DIAGNOSIS — C3411 Malignant neoplasm of upper lobe, right bronchus or lung: Secondary | ICD-10-CM | POA: Insufficient documentation

## 2022-08-09 DIAGNOSIS — C3490 Malignant neoplasm of unspecified part of unspecified bronchus or lung: Secondary | ICD-10-CM | POA: Diagnosis not present

## 2022-08-12 ENCOUNTER — Ambulatory Visit
Admission: RE | Admit: 2022-08-12 | Discharge: 2022-08-12 | Disposition: A | Discharge status: Home / Self Care | Payer: Medicare HMO | Source: Ambulatory Visit | Attending: Radiation Oncology | Admitting: Radiation Oncology

## 2022-08-12 ENCOUNTER — Encounter: Payer: Self-pay | Admitting: Radiation Oncology

## 2022-08-12 ENCOUNTER — Telehealth: Payer: Self-pay

## 2022-08-12 DIAGNOSIS — I700 Atherosclerosis of aorta: Secondary | ICD-10-CM | POA: Insufficient documentation

## 2022-08-12 DIAGNOSIS — Z923 Personal history of irradiation: Secondary | ICD-10-CM | POA: Insufficient documentation

## 2022-08-12 DIAGNOSIS — I2510 Atherosclerotic heart disease of native coronary artery without angina pectoris: Secondary | ICD-10-CM | POA: Diagnosis not present

## 2022-08-12 DIAGNOSIS — Z79890 Hormone replacement therapy (postmenopausal): Secondary | ICD-10-CM | POA: Insufficient documentation

## 2022-08-12 DIAGNOSIS — Z171 Estrogen receptor negative status [ER-]: Secondary | ICD-10-CM | POA: Diagnosis not present

## 2022-08-12 DIAGNOSIS — Z79899 Other long term (current) drug therapy: Secondary | ICD-10-CM | POA: Diagnosis not present

## 2022-08-12 DIAGNOSIS — C50212 Malignant neoplasm of upper-inner quadrant of left female breast: Secondary | ICD-10-CM | POA: Diagnosis not present

## 2022-08-12 DIAGNOSIS — R918 Other nonspecific abnormal finding of lung field: Secondary | ICD-10-CM | POA: Insufficient documentation

## 2022-08-12 DIAGNOSIS — C3411 Malignant neoplasm of upper lobe, right bronchus or lung: Secondary | ICD-10-CM | POA: Diagnosis not present

## 2022-08-12 NOTE — Telephone Encounter (Signed)
Pt needs to be scheduled in first available nodule slot per Dr. Lamonte Sakai.

## 2022-08-12 NOTE — Progress Notes (Signed)
Radiation Oncology         [phone] ________________________________  Name: Alisha [name] MRN: [id number]  Date: 08/12/2022  DOB: 09-27-1935  Follow-Up Visit Note  Outpatient  CC: Alisha Pepper, MD  Alisha Pepper, MD  Diagnosis and Prior Radiotherapy:    ICD-10-CM   1. Malignant neoplasm of upper lobe of right lung [facility])  C34.11       Cancer Staging  Malignant neoplasm of upper lobe of right lung [facility]) Staging form: Lung, AJCC 8th Edition - Clinical stage from 01/23/2021: cT1, cN0, cM0 - Signed by Eppie Gibson, MD on 01/23/2021 Stage prefix: Initial diagnosis Laterality: Right  Malignant neoplasm of upper-inner quadrant of left breast in female, estrogen receptor negative ([place]) Staging form: Breast, AJCC 8th Edition - Clinical stage from 10/29/2021: Stage IB (cT1c, cN0, cM0, G2, ER-, PR-, HER2-) - Signed by Gardenia Phlegm, NP on 11/06/2021 Stage prefix: Initial diagnosis Histologic grading system: 3 grade system  Malignant neoplasm of upper-outer quadrant of left breast in female, estrogen receptor negative ([place]) Staging form: Breast, AJCC 8th Edition - Clinical stage from 06/22/2019: Stage IB (cT1b, cN0, cM0, G3, ER-, PR-, HER2-) - Signed by Gardenia Phlegm, NP on 06/29/2019 Stage prefix: Initial diagnosis Histologic grading system: 3 grade system - Pathologic: Stage IB (pT1b, pN0, cM0, G3, ER-, PR-, HER2-) - Signed by Gardenia Phlegm, NP on 08/26/2020 Stage prefix: Initial diagnosis Histologic grading system: 3 grade system   Radiation Treatment Dates: 02/13/2021 through 02/20/2021 Site Technique Total Dose (Gy) Dose per Fx (Gy) Completed Fx Beam Energies  Lung, Right: Lung_Rt_RUL IMRT 54/54 18 3/3 6XFFF     Radiation Treatment Dates: 09/07/2019 through 10/06/2019 Site Technique Total Dose (Gy) Dose per Fx (Gy) Completed Fx Beam Energies  Breast, Left: Breast_Lt 3D 40.05/40.05 2.67 15/15 6X, 10X  Breast, Left: Breast_Lt_Bst specialPort  10/10 2 5/5 15E    CHIEF COMPLAINT: Here for follow-up and surveillance of lung cancer  Narrative:  [name] presents today for follow-up after completing radiation to her right upper lung on 02/20/2021, and to receive CT scan results from 08/09/22  PAIN: Patient denies pain.    RESPIRATORY: Patient denies any sob. Patient states that she has dry skin . States that her skin is also itching al ot  SWALLOWING/DIET: Pt denies dysphagia .   OTHER: Pt complains of fatigue and poor appetite.  Vitals:   08/12/22 1450  BP: (!) (P) 166/50  Pulse: (P) 62  Resp: (P) 18  Temp: (!) (P) 96.8 F (36 C)  TempSrc: (P) Temporal  SpO2: (P) 100%  Weight: (P) 198 lb 4 oz (89.9 kg)  Height: (P) 5' 4.5" (1.638 m)     Wt Readings from Last 3 Encounters:  08/12/22 (P) 198 lb 4 oz (89.9 kg)  05/19/22 199 lb 12.8 oz (90.6 kg)  05/09/22 191 lb 2 oz (86.7 kg)     ALLERGIES:  is allergic to amlodipine, benicar [olmesartan medoxomil], doxepin hcl, estradiol, gabapentin, levofloxacin, lisinopril, nebivolol hcl, olmesartan, tramadol hcl, penicillins, and sulfa antibiotics.  Meds: Current Outpatient Medications  Medication Sig Dispense Refill   albuterol (PROVENTIL HFA;VENTOLIN HFA) 108 (90 Base) MCG/ACT inhaler Inhale 1-2 puffs into the lungs every 6 (six) hours as needed for wheezing or shortness of breath.      amLODipine (NORVASC) 10 MG tablet TAKE ONE TABLET BY MOUTH AT NOON Needs appointment for further refills 90 tablet 1   Artificial Tear Solution (SOOTHE XP OP) Place 1 drop into both eyes  2 (two) times daily as needed (dry eyes).     atenolol (TENORMIN) 25 MG tablet atenolol 25 mg tablet     Biotin 10000 MCG TABS Take by mouth daily at 12 noon.     cetaphil (CETAPHIL) cream Apply 1 application topically as needed (dry skin).     chlorthalidone (HYGROTON) 25 MG tablet TAKE ONE TABLET BY MOUTH AT NOON 90 tablet 1   Cholecalciferol 1.25 MG (50000 UT) TABS Take 1 tablet by mouth once a week.  12 tablet 0   doxepin (SINEQUAN) 25 MG capsule doxepin 25 mg capsule     fluticasone (FLONASE) 50 MCG/ACT nasal spray Place 2 sprays into both nostrils daily as needed for allergies.     furosemide (LASIX) 20 MG tablet Take 20 mg by mouth daily as needed for fluid or edema.     levothyroxine (SYNTHROID, LEVOTHROID) 88 MCG tablet Take 88 mcg by mouth daily before breakfast.      meclizine (ANTIVERT) 12.5 MG tablet Take 12.5 mg by mouth 3 (three) times daily as needed for dizziness.     Multiple Vitamins-Minerals (PRESERVISION AREDS 2+MULTI VIT) CAPS Take 1 capsule by mouth in the morning and at bedtime.     potassium chloride SA (KLOR-CON) 20 MEQ tablet Take 20 mEq by mouth daily as needed (when taking furosemide).     valsartan (DIOVAN) 320 MG tablet Take 1 tablet (320 mg total) by mouth daily at 12 noon. Will need an appointment to for future refill 30 tablet 0   acetaminophen (TYLENOL) 500 MG tablet Take 1,000 mg by mouth every 6 (six) hours as needed (pain). (Patient not taking: Reported on 08/12/2022)     clindamycin (CLEOCIN) 300 MG capsule Take 600 mg by mouth See admin instructions. Take before dental appointments (Patient not taking: Reported on 08/12/2022)     diltiazem (CARDIZEM) 120 MG tablet diltiazem 120 mg tablet (Patient not taking: Reported on 08/12/2022)     hydrochlorothiazide (HYDRODIURIL) 25 MG tablet hydrochlorothiazide 25 mg tablet (Patient not taking: Reported on 08/12/2022)     indapamide (LOZOL) 1.25 MG tablet indapamide 1.25 mg tablet (Patient not taking: Reported on 08/12/2022)     methocarbamol (ROBAXIN) 500 MG tablet Take 1 tablet (500 mg total) by mouth every 6 (six) hours as needed for muscle spasms. (Patient not taking: Reported on 08/12/2022) 10 tablet 1   Travoprost, BAK Free, (TRAVATAN) 0.004 % SOLN ophthalmic solution Travatan Z 0.004 % eye drops (Patient not taking: Reported on 08/12/2022)     No current facility-administered medications for this encounter.     Physical Findings: The patient is in no acute distress. Patient is alert and oriented.  height is 5' 4.5" (1.638 m) (pended) and weight is 198 lb 4 oz (89.9 kg) (pended). Her temporal temperature is 96.8 F (36 C) (abnormal, pended). Her blood pressure is 166/50 (abnormal, pended) and her pulse is 62 (pended). Her respiration is 18 (pended) and oxygen saturation is 100% (pended). .    General: alert, oriented, NAD Breast: left chest wall s/p mastectomy, without evidence of breast cancer recurrence TFT:[id number]    Lab Findings: Lab Results  Component Value Date   WBC 8.9 04/29/2022   HGB 10.3 (L) 04/29/2022   HCT 32.1 (L) 04/29/2022   MCV 94.4 04/29/2022   PLT 315 04/29/2022    Radiographic Findings:  CT Chest Wo Contrast  Result Date: 08/11/2022 CLINICAL DATA:  Non-small cell lung cancer restaging * Tracking Code: BO * EXAM: CT CHEST WITHOUT CONTRAST TECHNIQUE:  Multidetector CT imaging of the chest was performed following the standard protocol without IV contrast. RADIATION DOSE REDUCTION: This exam was performed according to the departmental dose-optimization program which includes automated exposure control, adjustment of the mA and/or kV according to patient size and/or use of iterative reconstruction technique. COMPARISON:  05/07/2022 FINDINGS: Cardiovascular: Aortic atherosclerosis. Normal heart size. Scattered left coronary artery calcifications. No pericardial effusion. Mediastinum/Nodes: No enlarged mediastinal, hilar, or axillary lymph nodes. Small hiatal hernia. Thyroid gland, trachea, and esophagus demonstrate no significant findings. Lungs/Pleura: Unchanged post treatment/post radiation fibrosis and consolidation of the apical right upper lobe adjacent to fiducial markers (series 5, image 34). Continued enlargement of a nodule just inferiorly in the right upper lobe, which now measures 0.9 x 0.7 cm, previously 0.6 cm (series 5, image 47). Additional small nodules in the  inferior peripheral right upper lobe are unchanged, measuring up to 0.5 cm (series 5, image 60) as are multiple small nodules in the right lower lobe measuring up to 0.5 cm (series 5, image 86). No pleural effusion or pneumothorax. Upper Abdomen: No acute abnormality. Musculoskeletal: Status post left mastectomy. No acute osseous findings. IMPRESSION: 1. Unchanged post treatment/post radiation fibrosis and consolidation of the apical right upper lobe adjacent to fiducial markers. 2. Continued enlargement of a nodule just inferiorly in the right upper lobe, which now measures 0.9 x 0.7 cm, previously 0.6 cm. This is highly concerning for a slowly growing metastasis. 3. Additional small nodules in the inferior peripheral right upper lobe and right lower lobe are unchanged and nonspecific. 4. Coronary artery disease. Aortic Atherosclerosis (ICD10-I70.0). Electronically Signed   By: Delanna Ahmadi M.D.   On: 08/11/2022 09:33    Impression/Plan: I have personally reviewed her CT chest images.     The new RUL nodule is growing. It looks like a metastasis to me but it is hard to know for sure. She and I agree to proceed with a PET to see if there is any other site of metastatic or primary disease that we need to be aware of.  Her history is a bit perplexing (she has a history of breast cancer, and then a lung nodule that was suspicious for squamous cell ca on bx, but she is a non smoker).    After the PET, I'm hoping Dr Lamonte Sakai can see her to perform bronchoscopy/bx for anything that looks attainable. If nothing is attainable, and this nodule remains the only suspicious site for disease, I  can tx it empirically with SBRT.  She is pleased with this plan above. All questions answered today to her satisfaction. She is eager to gather  more information as above to understand this process in her lungs.   On date of service, in total, I spent 25 minutes on this encounter. Patient was seen in person.   _____________________________________   Eppie Gibson, MD

## 2022-08-13 ENCOUNTER — Other Ambulatory Visit: Payer: Self-pay

## 2022-08-13 DIAGNOSIS — C3411 Malignant neoplasm of upper lobe, right bronchus or lung: Secondary | ICD-10-CM

## 2022-08-13 NOTE — Telephone Encounter (Signed)
Noted. Nothing further needed at this time.  

## 2022-08-13 NOTE — Telephone Encounter (Signed)
Pt coming tomorrow, 10/26 at 1.

## 2022-08-14 ENCOUNTER — Encounter: Payer: Self-pay | Admitting: Emergency Medicine

## 2022-08-14 ENCOUNTER — Ambulatory Visit (INDEPENDENT_AMBULATORY_CARE_PROVIDER_SITE_OTHER): Payer: Medicare HMO | Admitting: Emergency Medicine

## 2022-08-14 VITALS — BP 134/76 | HR 52 | Temp 98.3°F | Ht 64.5 in | Wt 198.4 lb

## 2022-08-14 DIAGNOSIS — J4520 Mild intermittent asthma, uncomplicated: Secondary | ICD-10-CM

## 2022-08-14 DIAGNOSIS — R911 Solitary pulmonary nodule: Secondary | ICD-10-CM

## 2022-08-14 NOTE — Progress Notes (Signed)
Subjective:    Patient ID: Edgardo Roys Rubiano, female    DOB: [birth year], 87 y.o.   MRN: [id number]  HPI 87 yo never smoker, hx L breast CA, stage Ia squamous cell lung cancer of the RUL post SBRT 01/2021, remote asthma.  She is followed closely and oncology and radiation oncology.  She has a second right upper lobe nodule that is slowly increasing in size as below.  Referred now to consider next steps.  A PET scan has been planned. She is feeling well. Denies any resp sx or active asthma sx.   CT scan of the chest 08/09/2022 reviewed by me shows unchanged posttreatment right upper lobe fibrosis without any evidence of recurrence at that site.  There is a second right upper lobe nodule just inferior which is increasing in size, 9 mm from 6 mm concerning for second primary or metastatic disease   Review of Systems As per HPI  Past Medical History:  Diagnosis Date   Arthritis    Asthma    Never smoker   Blood transfusion    over 40 y ears   Cancer [facility])    History of radiation therapy 09/07/19- 10/06/19   Left Breast 15 fractions of 2.67 Gy to total 40.05 Gy. Left breast boost 5 fractions of 2 Gy each to total 10 Gy   Hypertension    on meds   Hypothyroidism    on meds   Malignant neoplasm of left breast metastatic to lung ([place]) 06/22/2019   a) 9/'20 ->  Upper-Outer Quadrant of Left Breast: Clinical stage from 06/22/2019: Stage IB (cT1b, cN0, cM0, G3, ER-, PR-, HER2-) => L Lumpectomy & XRT - (f/u Path 11/21: Stage 1B (pT1b, pN0,cM0; 1/'23: Recurrent Upper-Inner Quadrant ( Clinical Stage cT1, cN0, cM0) -mastectomy; now treated with XRT.   Malignant neoplasm of right upper lobe of lung ([place]) 01/23/2021   Stage IB (cT1c, cN0, cM0, G2, ER-, PR-, HER2-   Personal history of radiation therapy    Pre-diabetes      Family History  Problem Relation Age of Onset   Other Mother        died @ 73 - but was healthy   Hypertension Sister    Diabetes Sister    Diabetes Brother        no  longer on medications.    Anesthesia problems Neg Hx    Hypotension Neg Hx    Malignant hyperthermia Neg Hx    Pseudochol deficiency Neg Hx      Social History   Socioeconomic History   Marital status: Single    Spouse name: Not on file   Number of children: Not on file   Years of education: Not on file   Highest education level: Not on file  Occupational History   Not on file  Tobacco Use   Smoking status: Never   Smokeless tobacco: Never  Vaping Use   Vaping Use: Never used  Substance and Sexual Activity   Alcohol use: No   Drug use: No   Sexual activity: Not Currently  Other Topics Concern   Not on file  Social History Narrative   No longer exercising - since "got tired" - previously did elliptical, exercise bike etc.    While working - used worked gym      Diet: Continues to work on her diet - enjoys vegetables, beans, chicken, fish, occasional homemade burger (1x/mo), salad - does not add salt to food  Drinks: 1 cup of coffee/day, 6 8-oz glasses of water per day        Exercise: Increased exercise to 30 minutes per day - using weights, exercise bike, and treadmill   Social Determinants of Health   Financial Resource Strain: Not on file  Food Insecurity: Not on file  Transportation Needs: No Transportation Needs (08/19/2019)   PRAPARE - Hydrologist (Medical): No    Lack of Transportation (Non-Medical): No  Physical Activity: Not on file  Stress: Not on file  Social Connections: Not on file  Intimate Partner Violence: Not At Risk (08/19/2019)   Humiliation, Afraid, Rape, and Kick questionnaire    Fear of Current or Ex-Partner: No    Emotionally Abused: No    Physically Abused: No    Sexually Abused: No     Allergies  Allergen Reactions   Amlodipine Other (See Comments)    Other reaction(s): swelling, ha, itching   Benicar [Olmesartan Medoxomil] Other (See Comments)   Doxepin Hcl     Other reaction(s): Body swelling    Estradiol     Other reaction(s): itching   Gabapentin     Other reaction(s): swelling   Levofloxacin     Other reaction(s): Unknown   Lisinopril Itching    No energy Other reaction(s): itching, hair loss   Nebivolol Hcl     Other reaction(s): fatigue   Olmesartan     Other reaction(s): hair loss, nail changes   Tramadol Hcl     Other reaction(s): abdominal pain   Penicillins Itching and Rash    Has patient had a PCN reaction causing immediate rash, facial/tongue/throat swelling, SOB or lightheadedness with hypotension: No Has patient had a PCN reaction causing severe rash involving mucus membranes or skin necrosis: No Has patient had a PCN reaction that required hospitalization: No Has patient had a PCN reaction occurring within the last 10 years: No If all of the above answers are "NO", then may proceed with Cephalosporin use.   Sulfa Antibiotics Itching and Rash    Other reaction(s): Unknown     Outpatient Medications Prior to Visit  Medication Sig Dispense Refill   acetaminophen (TYLENOL) 500 MG tablet Take 1,000 mg by mouth every 6 (six) hours as needed (pain).     albuterol (PROVENTIL HFA;VENTOLIN HFA) 108 (90 Base) MCG/ACT inhaler Inhale 1-2 puffs into the lungs every 6 (six) hours as needed for wheezing or shortness of breath.      amLODipine (NORVASC) 10 MG tablet TAKE ONE TABLET BY MOUTH AT NOON Needs appointment for further refills 90 tablet 1   Artificial Tear Solution (SOOTHE XP OP) Place 1 drop into both eyes 2 (two) times daily as needed (dry eyes).     atenolol (TENORMIN) 25 MG tablet atenolol 25 mg tablet     Biotin 10000 MCG TABS Take by mouth daily at 12 noon.     cetaphil (CETAPHIL) cream Apply 1 application topically as needed (dry skin).     chlorthalidone (HYGROTON) 25 MG tablet TAKE ONE TABLET BY MOUTH AT NOON 90 tablet 1   Cholecalciferol 1.25 MG (50000 UT) TABS Take 1 tablet by mouth once a week. 12 tablet 0   clindamycin (CLEOCIN) 300 MG capsule Take 600  mg by mouth See admin instructions. Take before dental appointments     diltiazem (CARDIZEM) 120 MG tablet      doxepin (SINEQUAN) 25 MG capsule doxepin 25 mg capsule     fluticasone (FLONASE) 50 MCG/ACT  nasal spray Place 2 sprays into both nostrils daily as needed for allergies.     furosemide (LASIX) 20 MG tablet Take 20 mg by mouth daily as needed for fluid or edema.     hydrochlorothiazide (HYDRODIURIL) 25 MG tablet      indapamide (LOZOL) 1.25 MG tablet      levothyroxine (SYNTHROID, LEVOTHROID) 88 MCG tablet Take 88 mcg by mouth daily before breakfast.      meclizine (ANTIVERT) 12.5 MG tablet Take 12.5 mg by mouth 3 (three) times daily as needed for dizziness.     methocarbamol (ROBAXIN) 500 MG tablet Take 1 tablet (500 mg total) by mouth every 6 (six) hours as needed for muscle spasms. 10 tablet 1   Multiple Vitamins-Minerals (PRESERVISION AREDS 2+MULTI VIT) CAPS Take 1 capsule by mouth in the morning and at bedtime.     potassium chloride SA (KLOR-CON) 20 MEQ tablet Take 20 mEq by mouth daily as needed (when taking furosemide).     Travoprost, BAK Free, (TRAVATAN) 0.004 % SOLN ophthalmic solution      valsartan (DIOVAN) 320 MG tablet Take 1 tablet (320 mg total) by mouth daily at 12 noon. Will need an appointment to for future refill 30 tablet 0   No facility-administered medications prior to visit.         Objective:   Physical Exam Vitals:   08/14/22 1256  BP: 134/76  Pulse: (!) 52  Temp: 98.3 F (36.8 C)  TempSrc: Oral  SpO2: 100%  Weight: 198 lb 6.4 oz (90 kg)  Height: 5' 4.5" (1.638 m)    Gen: Pleasant, overwt woman, in no distress,  normal affect  ENT: No lesions,  mouth clear,  oropharynx clear, no postnasal drip  Neck: No JVD, some mild UA noise  Lungs: No use of accessory muscles, no crackles or wheezing on normal respiration, no wheeze on forced expiration  Cardiovascular: RRR, heart sounds normal, no murmur or gallops, no peripheral  edema  Musculoskeletal: No deformities, no cyanosis or clubbing  Neuro: alert, awake, non focal  Skin: Warm, no lesions or rash      Assessment & Plan:  Pulmonary nodule Slowly enlarging right upper lobe pulmonary nodule, satellite lesion from her previously treated squamous cell lung cancer.  Unclear whether this is a new primary versus metastatic disease either from the squamous cell lung cancer or her breast cancer.  She has a PET scan pending but I think we should go ahead and work on scheduling navigational bronchoscopy to best characterize the nodule.  If the PET scan reveals other targets for biopsy, more widespread metastatic disease then we could consider canceling the bronchoscopy and going after a different site.  She understands the rationale for navigational bronchoscopy and agrees.  I will try to get this scheduled for 09/08/2022 which is my next opening.  I suspect a PET scan will be done in the interim.  Asthma Mild asthma.  Not active at this time.  She is not using any bronchodilators.  Plan to continue to follow  Baltazar Apo, MD, PhD 08/14/2022, 1:20 PM [facility] [phone] or if no answer before 7:00PM call [phone] For any issues after 7:00PM please call eLink [phone]

## 2022-08-14 NOTE — Patient Instructions (Signed)
We reviewed the CT scan of the chest today.  We will work on arranging navigational bronchoscopy to evaluate a right upper lobe pulmonary nodule.  This would be done under general anesthesia as an outpatient at [facility].  We will try to get this arranged for 09/08/2022.  You will need a designated driver and someone to watch you at home afterwards on the day of the procedure. Go ahead and get your PET scan as planned by Dr. Isidore Moos Follow Dr. Lamonte Sakai in 1 month or next available after 09/08/2022

## 2022-08-14 NOTE — Addendum Note (Signed)
Addended by: Loma Sousa on: 08/14/2022 01:52 PM   Modules accepted: Orders

## 2022-08-14 NOTE — Assessment & Plan Note (Signed)
Mild asthma.  Not active at this time.  She is not using any bronchodilators.  Plan to continue to follow

## 2022-08-14 NOTE — Assessment & Plan Note (Signed)
Slowly enlarging right upper lobe pulmonary nodule, satellite lesion from her previously treated squamous cell lung cancer.  Unclear whether this is a new primary versus metastatic disease either from the squamous cell lung cancer or her breast cancer.  She has a PET scan pending but I think we should go ahead and work on scheduling navigational bronchoscopy to best characterize the nodule.  If the PET scan reveals other targets for biopsy, more widespread metastatic disease then we could consider canceling the bronchoscopy and going after a different site.  She understands the rationale for navigational bronchoscopy and agrees.  I will try to get this scheduled for 09/08/2022 which is my next opening.  I suspect a PET scan will be done in the interim.

## 2022-08-29 ENCOUNTER — Ambulatory Visit (HOSPITAL_COMMUNITY)
Admission: RE | Admit: 2022-08-29 | Discharge: 2022-08-29 | Disposition: A | Discharge status: Home / Self Care | Payer: Medicare HMO | Source: Ambulatory Visit | Attending: Radiation Oncology | Admitting: Radiation Oncology

## 2022-08-29 DIAGNOSIS — C3411 Malignant neoplasm of upper lobe, right bronchus or lung: Secondary | ICD-10-CM | POA: Insufficient documentation

## 2022-08-29 DIAGNOSIS — R911 Solitary pulmonary nodule: Secondary | ICD-10-CM | POA: Diagnosis not present

## 2022-08-29 LAB — GLUCOSE, CAPILLARY: Glucose-Capillary: 86 mg/dL (ref 70–99)

## 2022-08-29 MED ORDER — FLUDEOXYGLUCOSE F - 18 (FDG) INJECTION
10.3000 | Freq: Once | INTRAVENOUS | Status: AC
  Administered 2022-08-29: 10.3 via INTRAVENOUS

## 2022-09-01 ENCOUNTER — Other Ambulatory Visit: Payer: Self-pay

## 2022-09-01 ENCOUNTER — Inpatient Hospital Stay: Payer: Medicare HMO

## 2022-09-01 ENCOUNTER — Inpatient Hospital Stay: Payer: Medicare HMO | Attending: Hematology and Oncology | Admitting: Hematology and Oncology

## 2022-09-01 VITALS — BP 143/44 | HR 52 | Temp 97.7°F | Resp 16 | Ht 64.0 in | Wt 196.6 lb

## 2022-09-01 DIAGNOSIS — I10 Essential (primary) hypertension: Secondary | ICD-10-CM | POA: Insufficient documentation

## 2022-09-01 DIAGNOSIS — C3411 Malignant neoplasm of upper lobe, right bronchus or lung: Secondary | ICD-10-CM | POA: Insufficient documentation

## 2022-09-01 DIAGNOSIS — Z171 Estrogen receptor negative status [ER-]: Secondary | ICD-10-CM | POA: Diagnosis not present

## 2022-09-01 DIAGNOSIS — Z9012 Acquired absence of left breast and nipple: Secondary | ICD-10-CM | POA: Diagnosis not present

## 2022-09-01 DIAGNOSIS — C50412 Malignant neoplasm of upper-outer quadrant of left female breast: Secondary | ICD-10-CM

## 2022-09-01 DIAGNOSIS — E039 Hypothyroidism, unspecified: Secondary | ICD-10-CM | POA: Diagnosis not present

## 2022-09-01 DIAGNOSIS — C50212 Malignant neoplasm of upper-inner quadrant of left female breast: Secondary | ICD-10-CM | POA: Insufficient documentation

## 2022-09-01 DIAGNOSIS — Z79890 Hormone replacement therapy (postmenopausal): Secondary | ICD-10-CM | POA: Insufficient documentation

## 2022-09-01 DIAGNOSIS — Z8639 Personal history of other endocrine, nutritional and metabolic disease: Secondary | ICD-10-CM | POA: Insufficient documentation

## 2022-09-01 DIAGNOSIS — Z85118 Personal history of other malignant neoplasm of bronchus and lung: Secondary | ICD-10-CM | POA: Insufficient documentation

## 2022-09-01 DIAGNOSIS — E559 Vitamin D deficiency, unspecified: Secondary | ICD-10-CM

## 2022-09-01 DIAGNOSIS — Z923 Personal history of irradiation: Secondary | ICD-10-CM | POA: Diagnosis not present

## 2022-09-01 DIAGNOSIS — Z90710 Acquired absence of both cervix and uterus: Secondary | ICD-10-CM | POA: Diagnosis not present

## 2022-09-01 LAB — CBC WITH DIFFERENTIAL/PLATELET
Abs Immature Granulocytes: 0.01 10*3/uL (ref 0.00–0.07)
Basophils Absolute: 0.1 10*3/uL (ref 0.0–0.1)
Basophils Relative: 1 %
Eosinophils Absolute: 0.3 10*3/uL (ref 0.0–0.5)
Eosinophils Relative: 4 %
HCT: 32.1 % — ABNORMAL LOW (ref 36.0–46.0)
Hemoglobin: 10.7 g/dL — ABNORMAL LOW (ref 12.0–15.0)
Immature Granulocytes: 0 %
Lymphocytes Relative: 30 %
Lymphs Abs: 2.4 10*3/uL (ref 0.7–4.0)
MCH: 31.3 pg (ref 26.0–34.0)
MCHC: 33.3 g/dL (ref 30.0–36.0)
MCV: 93.9 fL (ref 80.0–100.0)
Monocytes Absolute: 0.7 10*3/uL (ref 0.1–1.0)
Monocytes Relative: 8 %
Neutro Abs: 4.8 10*3/uL (ref 1.7–7.7)
Neutrophils Relative %: 57 %
Platelets: 282 10*3/uL (ref 150–400)
RBC: 3.42 MIL/uL — ABNORMAL LOW (ref 3.87–5.11)
RDW: 13.2 % (ref 11.5–15.5)
WBC: 8.3 10*3/uL (ref 4.0–10.5)
nRBC: 0 % (ref 0.0–0.2)

## 2022-09-01 LAB — COMPREHENSIVE METABOLIC PANEL
ALT: 10 U/L (ref 0–44)
AST: 16 U/L (ref 15–41)
Albumin: 4.5 g/dL (ref 3.5–5.0)
Alkaline Phosphatase: 61 U/L (ref 38–126)
Anion gap: 8 (ref 5–15)
BUN: 21 mg/dL (ref 8–23)
CO2: 27 mmol/L (ref 22–32)
Calcium: 9.2 mg/dL (ref 8.9–10.3)
Chloride: 105 mmol/L (ref 98–111)
Creatinine, Ser: 0.88 mg/dL (ref 0.44–1.00)
GFR, Estimated: >60 mL/min (ref >60)
Glucose, Bld: 86 mg/dL (ref 70–99)
Potassium: 3.6 mmol/L (ref 3.5–5.1)
Sodium: 140 mmol/L (ref 135–145)
Total Bilirubin: 0.3 mg/dL (ref 0.3–1.2)
Total Protein: 7 g/dL (ref 6.5–8.1)

## 2022-09-01 LAB — VITAMIN D 25 HYDROXY (VIT D DEFICIENCY, FRACTURES): Vit D, 25-Hydroxy: 61.89 ng/mL (ref 30–100)

## 2022-09-01 NOTE — Progress Notes (Unsigned)
[place]  [phone]) [phone] [phone]     ID: Alisha [name] DOB: [birth year]  MR#: [id number]  HYQ#:[id number]  Patient Care Team: [name], MD as PCP - General (Family Medicine) Leonie Man, MD as PCP - Cardiology (Cardiology) Eppie Gibson, MD as Attending Physician (Radiation Oncology) Leonie Man, MD as Consulting Physician (Cardiology) Collene Gobble, MD as Consulting Physician (Pulmonary Disease) Benay Pike, MD as Consulting Physician (Hematology and Oncology) Stark Klein, MD as Consulting Physician (General Surgery) Benay Pike, MD  CHIEF COMPLAINT: triple negative breast cancer; squamous cell lung cancer  CURRENT TREATMENT: Observation   INTERVAL HISTORY:  Alisha [name] returns today for follow up of her triple negative breast cancer.   She is doing well today except for some fatigue. Fatigue happens in episodes. She denies any breast masses. No cough, chest pain or SOB. Rest of the pertinent 10 point ROS reviewed and negative.   COVID 19 VACCINATION STATUS: infection 11/2019; Pfizer x4, most recently 03/2021   Oncology History  Alisha [name] was noted to have a left breast upper-outer quadrant distortion on screening mammogram in 2017. She proceeded to biopsy ([id number]), which showed a complex sclerosing lesion. She opted to undergo left lumpectomy ([id number]) on 08/11/2016 under Dr. Brantley Stage, which revealed a radial scar with calcifications.   She presented for routine screening mammogram, which showed a possible mass and adjacent calcifications in the left breast. She was referred to [facility] imaging on 06/16/2019. Physical exam performed that day showed a palpable soft thickening in the upper central aspect of the left breast. She underwent left diagnostic mammography with tomography and ultrasonography showing: breast density category B; suspicious 7 mm mass in the 12 o'clock  location of the left breast; small indeterminate group of calcifications 4 mm anterior to the mass warrant excision if the mass is positive for malignancy; left axilla negative for adenopathy.  Accordingly on 06/22/2019 she proceeded to biopsy of the left breast area in question. The pathology from this procedure ([id number]) showed: invasive ductal carcinoma, grade 3. Prognostic indicators significant for: estrogen receptor, 0% negative and progesterone receptor, 0% negative. Proliferation marker Ki67 at 30%. HER2 negative by immunohistochemistry (0).  (1) status post left lumpectomy and sentinel lymph node sampling 07/27/2019 for a pT1b pN0, stage IB invasive ductal carcinoma, with clear margins. (a) 2 left axillary lymph nodes were removed No adjuvant chemotherapy planned  Adjuvant radiation 09/07/2019 through 10/06/2019 Site Technique Total Dose (Gy) Dose per Fx (Gy) Completed Fx Beam Energies  Breast, Left: Breast_Lt 3D 40.05/40.05 2.67 15/15 6X, 10X  Breast, Left: Breast_Lt_Bst specialPort 10/10 2 5/5 15E    LUNG CANCER CT of the chest 11/14/2020 showed a new (as compared to July 2020) right upper lobe nodule measuring 0.9 cm, with no other findings of concern  (a) PET scan 12/07/2020 find the right upper lobe nodule in question to be hypermetabolic, with no other hypermetabolic areas elsewhere in the chest or abdomen  (b) CA 27-29 and CEA obtained 12/20/2020 were in the normal range  (c) bronchoscopic biopsy 12/27/2020 read as consistent with squamous cell carcinoma  Radiation to lung with curative intent 02/13/2021 through 02/20/2021 Site Technique Total Dose (Gy) Dose per Fx (Gy) Completed Fx Beam Energies  Lung, Right: Lung_Rt_RUL IMRT 54/54 18 3/3 6XFFF    10/18/2021  Left mammogram showed a 1.0 cm mass in the left breast at 10:00, no evidence of left axillary lymphadenopathy Biopsy from this mass showed invasive ductal carcinoma  grade 2 of 3, prognostic markers show ER 0% negative,  PR 0% negative, KI of 20% and HER2 negative.  11/26/2021 patient underwent left mastectomy, final pathology showed invasive ductal carcinoma, 2 cm, grade, resection margins are -3 sentinel lymph nodes negative for carcinoma, prognostics not repeated.  Initial prognostics ER 0% negative, PR 0% negative, HER2 negative, Ki-67 of 20%.  During her last visit, we have discussed about TC adjuvant chemotherapy for 4 cycles.  PAST MEDICAL HISTORY: Past Medical History:  Diagnosis Date   Arthritis    Asthma    Never smoker   Blood transfusion    over 40 y ears   Cancer [facility])    History of radiation therapy 09/07/19- 10/06/19   Left Breast 15 fractions of 2.67 Gy to total 40.05 Gy. Left breast boost 5 fractions of 2 Gy each to total 10 Gy   Hypertension    on meds   Hypothyroidism    on meds   Malignant neoplasm of left breast metastatic to lung ([place]) 06/22/2019   a) 9/'20 ->  Upper-Outer Quadrant of Left Breast: Clinical stage from 06/22/2019: Stage IB (cT1b, cN0, cM0, G3, ER-, PR-, HER2-) => L Lumpectomy & XRT - (f/u Path 11/21: Stage 1B (pT1b, pN0,cM0; 1/'23: Recurrent Upper-Inner Quadrant ( Clinical Stage cT1, cN0, cM0) -mastectomy; now treated with XRT.   Malignant neoplasm of right upper lobe of lung ([place]) 01/23/2021   Stage IB (cT1c, cN0, cM0, G2, ER-, PR-, HER2-   Personal history of radiation therapy    Pre-diabetes     PAST SURGICAL HISTORY: Past Surgical History:  Procedure Laterality Date   ABDOMINAL HYSTERECTOMY     32 yrs   AXILLARY SENTINEL NODE BIOPSY Left 11/26/2021   Procedure: LEFT AXILLARY SENTINEL NODE BIOPSY;  Surgeon: Stark Klein, MD;  Location: [place];  Service: General;  Laterality: Left;   BREAST LUMPECTOMY Left 08/11/2016   BREAST LUMPECTOMY Left 07/27/2019   BREAST LUMPECTOMY WITH RADIOACTIVE SEED AND SENTINEL LYMPH NODE BIOPSY Left 07/27/2019   Procedure: LEFT BREAST LUMPECTOMY WITH RADIOACTIVE SEED AND LEFT SENTINEL LYMPH NODE MAPPING;  Surgeon: Erroll Luna,  MD;  Location: [place];  Service: General;  Laterality: Left;   BREAST LUMPECTOMY WITH RADIOACTIVE SEED LOCALIZATION Left 08/11/2016   Procedure: LEFT BREAST LUMPECTOMY WITH RADIOACTIVE SEED LOCALIZATION;  Surgeon: Erroll Luna, MD;  Location: [place];  Service: General;  Laterality: Left;   BRONCHIAL BIOPSY  12/27/2020   Procedure: BRONCHIAL BIOPSIES;  Surgeon: Collene Gobble, MD;  Location: [facility];  Service: Pulmonary;;   BRONCHIAL BRUSHINGS  12/27/2020   Procedure: BRONCHIAL BRUSHINGS;  Surgeon: Collene Gobble, MD;  Location: [facility];  Service: Pulmonary;;   BRONCHIAL NEEDLE ASPIRATION BIOPSY  12/27/2020   Procedure: BRONCHIAL NEEDLE ASPIRATION BIOPSIES;  Surgeon: Collene Gobble, MD;  Location: [place];  Service: Pulmonary;;   BRONCHIAL WASHINGS  12/27/2020   Procedure: BRONCHIAL WASHINGS;  Surgeon: Collene Gobble, MD;  Location: [facility];  Service: Pulmonary;;   CATARACT EXTRACTION, BILATERAL     CORONARY CALCIUM SCORE-CT ANGIOGRAM  04/2019   Small, < 47m nodule noted.  Difficult study to read.  Coronary calcium score was 11.  Significant motion artifact, but visible territory showed minimal disease.  Minimal calcium noted in the ostial LAD but no suggestion of significant CAD.   EYE SURGERY     FIDUCIAL MARKER PLACEMENT  12/27/2020   Procedure: FIDUCIAL MARKER PLACEMENT;  Surgeon: BCollene Gobble MD;  Location: [facility]  Service:  Pulmonary;;   JOINT REPLACEMENT Bilateral    left knee 2012   KNEE ARTHROPLASTY  02/17/2012   Procedure: COMPUTER ASSISTED TOTAL KNEE ARTHROPLASTY;  Surgeon: Meredith Pel, MD;  Location: [place];  Service: Orthopedics;  Laterality: Right;  Right total knee replacement   MASTECTOMY W/ SENTINEL NODE BIOPSY Left 11/26/2021   Procedure: LEFT BREAST MASTECTOMY;  Surgeon: Stark Klein, MD;  Location: [place];  Service: General;  Laterality: Left;   TRANSTHORACIC ECHOCARDIOGRAM  04/02/2020   Normal LV function.  No R  WMA.  Moderate LVH.  GRII DD with elevated filling pressures.  Moderate LA dilation with trivial MR.  Mild aortic valve sclerosis.   VIDEO BRONCHOSCOPY WITH ENDOBRONCHIAL NAVIGATION N/A 12/27/2020   Procedure: VIDEO BRONCHOSCOPY WITH ENDOBRONCHIAL NAVIGATION;  Surgeon: Collene Gobble, MD;  Location: [place] [facility];  Service: Pulmonary;  Laterality: N/A;    FAMILY HISTORY: Family History  Problem Relation Age of Onset   Other Mother        died @ 39 - but was healthy   Hypertension Sister    Diabetes Sister    Diabetes Brother        no longer on medications.    Anesthesia problems Neg Hx    Hypotension Neg Hx    Malignant hyperthermia Neg Hx    Pseudochol deficiency Neg Hx   Patient's father was in his mid 68s years old when he died from unknown causes. Patient's mother died from "natural causes" at age 40. The patient denies a family hx of breast or ovarian cancer. She has 5 brothers and 1 sister.   GYNECOLOGIC HISTORY:  No LMP recorded. Patient is postmenopausal. 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  She is now retired.  She describes herself a single.  Her daughter Marisa Hua lives in [place].  She has multiple medical problems.  The patient's son died at age 8.  The patient has 1 grandchild, Johnny Bridge, who is buying a home 5 minutes from the patient t and works for [employer].  He has 4 children, 2 have gone through college, one is "in between" and the other 1 is currently in college.  The patient herself attends a local [place] DIRECTIVES: The patient intends to name her grandson Johnny Bridge as her healthcare power of attorney.  He can be reached at  [phone].  She was given the appropriate documents to complete and notarize at her discretion at the time of her 07/21/2019 visit   HEALTH MAINTENANCE: Social History   Tobacco Use   Smoking status: Never   Smokeless tobacco: Never  Vaping Use   Vaping Use: Never used  Substance Use Topics   Alcohol use: No   Drug use: No     Allergies  Allergen Reactions   Amlodipine Other (See Comments)    Other reaction(s): swelling, ha, itching   Benicar [Olmesartan Medoxomil] Other (See Comments)   Doxepin Hcl     Other reaction(s): Body swelling   Estradiol     Other reaction(s): itching   Gabapentin     Other reaction(s): swelling   Levofloxacin     Other reaction(s): Unknown   Lisinopril Itching    No  energy Other reaction(s): itching, hair loss   Nebivolol Hcl     Other reaction(s): fatigue   Olmesartan     Other reaction(s): hair loss, nail changes   Tramadol Hcl     Other reaction(s): abdominal pain   Penicillins Itching and Rash    Has patient had a PCN reaction causing immediate rash, facial/tongue/throat swelling, SOB or lightheadedness with hypotension: No Has patient had a PCN reaction causing severe rash involving mucus membranes or skin necrosis: No Has patient had a PCN reaction that required hospitalization: No Has patient had a PCN reaction occurring within the last 10 years: No If all of the above answers are "NO", then may proceed with Cephalosporin use.   Sulfa Antibiotics Itching and Rash    Other reaction(s): Unknown    Current Outpatient Medications  Medication Sig Dispense Refill   acetaminophen (TYLENOL) 500 MG tablet Take 1,000 mg by mouth every 6 (six) hours as needed (pain).     albuterol (PROVENTIL HFA;VENTOLIN HFA) 108 (90 Base) MCG/ACT inhaler Inhale 1-2 puffs into the lungs every 6 (six) hours as needed for wheezing or shortness of breath.      amLODipine (NORVASC) 10 MG tablet TAKE ONE TABLET BY MOUTH AT NOON Needs appointment for further  refills 90 tablet 1   Artificial Tear Solution (SOOTHE XP OP) Place 1 drop into both eyes 2 (two) times daily as needed (dry eyes).     atenolol (TENORMIN) 25 MG tablet atenolol 25 mg tablet     Biotin 10000 MCG TABS Take by mouth daily at 12 noon.     cetaphil (CETAPHIL) cream Apply 1 application topically as needed (dry skin).     chlorthalidone (HYGROTON) 25 MG tablet TAKE ONE TABLET BY MOUTH AT NOON 90 tablet 1   Cholecalciferol 1.25 MG (50000 UT) TABS Take 1 tablet by mouth once a week. 12 tablet 0   clindamycin (CLEOCIN) 300 MG capsule Take 600 mg by mouth See admin instructions. Take before dental appointments     diltiazem (CARDIZEM) 120 MG tablet      doxepin (SINEQUAN) 25 MG capsule doxepin 25 mg capsule     fluticasone (FLONASE) 50 MCG/ACT nasal spray Place 2 sprays into both nostrils daily as needed for allergies.     furosemide (LASIX) 20 MG tablet Take 20 mg by mouth daily as needed for fluid or edema.     hydrochlorothiazide (HYDRODIURIL) 25 MG tablet      indapamide (LOZOL) 1.25 MG tablet      levothyroxine (SYNTHROID, LEVOTHROID) 88 MCG tablet Take 88 mcg by mouth daily before breakfast.      meclizine (ANTIVERT) 12.5 MG tablet Take 12.5 mg by mouth 3 (three) times daily as needed for dizziness.     methocarbamol (ROBAXIN) 500 MG tablet Take 1 tablet (500 mg total) by mouth every 6 (six) hours as needed for muscle spasms. 10 tablet 1   Multiple Vitamins-Minerals (PRESERVISION AREDS 2+MULTI VIT) CAPS Take 1 capsule by mouth in the morning and at bedtime.     potassium chloride SA (KLOR-CON) 20 MEQ tablet Take 20 mEq by mouth daily as needed (when taking furosemide).     Travoprost, BAK Free, (TRAVATAN) 0.004 % SOLN ophthalmic solution      valsartan (DIOVAN) 320 MG tablet Take 1 tablet (320 mg total) by mouth daily at 12 noon. Will need an appointment to for future refill 30 tablet 0   No current facility-administered medications for this visit.    OBJECTIVE: African-American  woman who appears stated age  30:   09/01/22 1350  BP: (!) 143/44  Pulse: (!) 52  Resp: 16  Temp: 97.7 F (36.5 C)  SpO2: 100%      Body mass index is 33.75 kg/m.   Wt Readings from Last 3 Encounters:  09/01/22 196 lb 9.6 oz (89.2 kg)  08/14/22 198 lb 6.4 oz (90 kg)  08/12/22 (P) 198 lb 4 oz (89.9 kg)      ECOG FS:2 - Symptomatic, <50% confined to bed  Physical Exam Constitutional:      Appearance: Normal appearance.  Chest:     Comments: Right breast normal to inspection and palpation. Left breast s/p mastectomy. No palpable changes No lymphadenopathy Musculoskeletal:     Cervical back: Normal range of motion and neck supple. No rigidity.  Lymphadenopathy:     Cervical: No cervical adenopathy.  Neurological:     Mental Status: She is alert.      LAB RESULTS:  CMP     Component Value Date/Time   NA 140 09/01/2022 1319   NA 141 01/24/2021 1627   K 3.6 09/01/2022 1319   CL 105 09/01/2022 1319   CO2 27 09/01/2022 1319   GLUCOSE 86 09/01/2022 1319   BUN 21 09/01/2022 1319   BUN 25 01/24/2021 1627   CREATININE 0.88 09/01/2022 1319   CREATININE 0.78 07/21/2019 1525   CALCIUM 9.2 09/01/2022 1319   PROT 7.0 09/01/2022 1319   ALBUMIN 4.5 09/01/2022 1319   AST 16 09/01/2022 1319   AST 15 07/21/2019 1525   ALT 10 09/01/2022 1319   ALT 19 07/21/2019 1525   ALKPHOS 61 09/01/2022 1319   BILITOT 0.3 09/01/2022 1319   BILITOT 0.2 (L) 07/21/2019 1525   GFRNONAA >60 09/01/2022 1319   GFRNONAA >60 07/21/2019 1525   GFRAA 76 12/13/2020 1035   GFRAA >60 07/21/2019 1525    No results found for: "TOTALPROTELP", "ALBUMINELP", "A1GS", "A2GS", "BETS", "BETA2SER", "GAMS", "MSPIKE", "SPEI"  No results found for: "KPAFRELGTCHN", "LAMBDASER", "KAPLAMBRATIO"  Lab Results  Component Value Date   WBC 8.3 09/01/2022   NEUTROABS 4.8 09/01/2022   HGB 10.7 (L) 09/01/2022   HCT 32.1 (L) 09/01/2022   MCV 93.9 09/01/2022   PLT 282 09/01/2022   No results found for:  "LABCA2"  No components found for: "FFMBWG665"  No results for input(s): "INR" in the last 168 hours.  No results found for: "LABCA2"  No results found for: "CAN199"  No results found for: "CAN125"  No results found for: "LDJ570"  Lab Results  Component Value Date   CA2729 15.8 12/20/2020    No components found for: "HGQUANT"  Lab Results  Component Value Date   CEA1 1.24 12/20/2020   /  CEA ([facility])  Date Value Ref Range Status  12/20/2020 1.24 0.00 - 5.00 ng/mL Final    Comment:    (NOTE) This test was performed using Architect's Chemiluminescent Microparticle Immunoassay. Values obtained from different assay methods cannot be used interchangeably. Please note that 5-10% of patients who smoke may see CEA levels up to 6.9 ng/mL. Performed at [facility], [address], [place], [place] [address]      No results found for: "AFPTUMOR"  No results found for: "CHROMOGRNA"  No results found for: "HGBA", "HGBA2QUANT", "HGBFQUANT", "HGBSQUAN" (Hemoglobinopathy evaluation)   No results found for: "LDH"  Lab Results  Component Value Date   IRON 62 04/14/2022   TIBC 256 04/14/2022   IRONPCTSAT 24 04/14/2022   (  Iron and TIBC)  Lab Results  Component Value Date   FERRITIN 204 04/14/2022    Urinalysis    Component Value Date/Time   COLORURINE YELLOW 07/02/2018 1752   APPEARANCEUR CLEAR 07/02/2018 1752   LABSPEC 1.021 07/02/2018 1752   PHURINE 5.0 07/02/2018 1752   GLUCOSEU NEGATIVE 07/02/2018 1752   HGBUR NEGATIVE 07/02/2018 1752   BILIRUBINUR NEGATIVE 07/02/2018 1752   KETONESUR 20 (A) 07/02/2018 1752   PROTEINUR NEGATIVE 07/02/2018 1752   UROBILINOGEN 1.0 02/20/2012 0927   NITRITE NEGATIVE 07/02/2018 1752   LEUKOCYTESUR NEGATIVE 07/02/2018 1752    STUDIES: CT Chest Wo Contrast  Result Date: 08/11/2022 CLINICAL DATA:  Non-small cell lung cancer restaging * Tracking Code: BO * EXAM: CT CHEST WITHOUT CONTRAST  TECHNIQUE: Multidetector CT imaging of the chest was performed following the standard protocol without IV contrast. RADIATION DOSE REDUCTION: This exam was performed according to the departmental dose-optimization program which includes automated exposure control, adjustment of the mA and/or kV according to patient size and/or use of iterative reconstruction technique. COMPARISON:  05/07/2022 FINDINGS: Cardiovascular: Aortic atherosclerosis. Normal heart size. Scattered left coronary artery calcifications. No pericardial effusion. Mediastinum/Nodes: No enlarged mediastinal, hilar, or axillary lymph nodes. Small hiatal hernia. Thyroid gland, trachea, and esophagus demonstrate no significant findings. Lungs/Pleura: Unchanged post treatment/post radiation fibrosis and consolidation of the apical right upper lobe adjacent to fiducial markers (series 5, image 34). Continued enlargement of a nodule just inferiorly in the right upper lobe, which now measures 0.9 x 0.7 cm, previously 0.6 cm (series 5, image 47). Additional small nodules in the inferior peripheral right upper lobe are unchanged, measuring up to 0.5 cm (series 5, image 60) as are multiple small nodules in the right lower lobe measuring up to 0.5 cm (series 5, image 86). No pleural effusion or pneumothorax. Upper Abdomen: No acute abnormality. Musculoskeletal: Status post left mastectomy. No acute osseous findings. IMPRESSION: 1. Unchanged post treatment/post radiation fibrosis and consolidation of the apical right upper lobe adjacent to fiducial markers. 2. Continued enlargement of a nodule just inferiorly in the right upper lobe, which now measures 0.9 x 0.7 cm, previously 0.6 cm. This is highly concerning for a slowly growing metastasis. 3. Additional small nodules in the inferior peripheral right upper lobe and right lower lobe are unchanged and nonspecific. 4. Coronary artery disease. Aortic Atherosclerosis (ICD10-I70.0). Electronically Signed   By: Delanna Ahmadi M.D.   On: 08/11/2022 09:33     ELIGIBLE FOR AVAILABLE RESEARCH PROTOCOL: no  ASSESSMENT:   87 y.o. [employer], [place] with recently diagnosed left breast upper outer quadrant invasive ductal carcinoma, triple negative who is here for follow-up.  Please refer to oncological history for complete history.  Her last breast cancer back in 2020 was in the left breast at 12 o'clock position again IDC triple negative.  At that time she had surgery, adjuvant radiation, no adjuvant chemotherapy.  Since then she also had lung cancer which was treated with SBRT with excellent response.  She then had an abnormal mammogram of the left breast which showed a 1 cm tumor, triple negative on the initial biopsy, grade 2.  Discussion was to consider upfront surgery given her age and she is now status post left mastectomy.  Final pathology showed 2 cm, grade 2 tumor, prognostics not repeated, initial prognostics ER 0%, PR 0% and HER2 negative. Given triple negative pathology and tumor greater than 5 mm, we have discussed about adjuvant docetaxel and cyclophosphamide every 21 days for 4 cycles  especially since she has robust performance status. We have discussed about adverse effects of chemotherapy including but not limited to fatigue, nausea, vomiting, diarrhea, increased risk of cytopenias, neuropathy, infections etc.  She declined adjuvant chemotherapy. Since last visit she had a PET imaging which showed 8 mm RUL nodule, borderline in size for PET resolution, no other major concerns. Sheis currently scheduled for navigational bronchoscopy by Dr Lamonte Sakai on 11.20. I will send a message to Dr Isidore Moos as well .  Mammogram and  left axillary [place], recommended by Dr Barry Dienes scheduled for Nov. At this time, no concerns for breast cancer recurrence.  Total encounter time 30 minutes.  *Total Encounter Time as defined by the Centers for Medicare and Medicaid Services includes, in addition to the face-to-face time of a  patient visit (documented in the note above) non-face-to-face time: obtaining and reviewing outside history, ordering and reviewing medications, tests or procedures, care coordination (communications with other health care professionals or caregivers) and documentation in the medical record.

## 2022-09-04 ENCOUNTER — Other Ambulatory Visit: Payer: Medicare HMO

## 2022-09-04 DIAGNOSIS — J4520 Mild intermittent asthma, uncomplicated: Secondary | ICD-10-CM

## 2022-09-05 ENCOUNTER — Encounter (HOSPITAL_COMMUNITY): Payer: Self-pay | Admitting: Emergency Medicine

## 2022-09-05 ENCOUNTER — Encounter (HOSPITAL_COMMUNITY): Payer: Self-pay | Admitting: Vascular Surgery

## 2022-09-05 ENCOUNTER — Other Ambulatory Visit: Payer: Self-pay

## 2022-09-05 NOTE — Anesthesia Preprocedure Evaluation (Signed)
Anesthesia Evaluation    Airway        Dental   Pulmonary           Cardiovascular hypertension,      Neuro/Psych    GI/Hepatic   Endo/Other    Renal/GU      Musculoskeletal   Abdominal   Peds  Hematology   Anesthesia Other Findings   Reproductive/Obstetrics                             Anesthesia Physical Anesthesia Plan  ASA:   Anesthesia Plan:    Post-op Pain Management:    Induction:   PONV Risk Score and Plan:   Airway Management Planned:   Additional Equipment:   Intra-op Plan:   Post-operative Plan:   Informed Consent:   Plan Discussed with:   Anesthesia Plan Comments: (PAT note written 09/05/2022 by Myra Gianotti, PA-C.  )       Anesthesia Quick Evaluation

## 2022-09-05 NOTE — Progress Notes (Signed)
Anesthesia Chart Review: Alisha [name]  Case: [id number] Date/Time: 09/08/22 1315   Procedure: ROBOTIC ASSISTED NAVIGATIONAL BRONCHOSCOPY (Right)   Anesthesia type: General   Pre-op diagnosis: RIGHT UPPER LOPE PULMONARY NODULE   Location: [facility] / [place] [facility]   Surgeons: Collene Gobble, MD       DISCUSSION: Patient is an 87 year old female scheduled for the above procedure. She has a history of left breast cancer (s/p left mastectomy, radiation) and RUL lung cancer (s/p SBRT 02/13/21-02/20/21). Imaging showed enlarging RUL lung nodule, so above procedure recommended.   History includes never smoker, HTN, bradycardia, prediabetes, hypothyroidism, asthma, breast cancer (left breast lumpectomy 08/11/16 for usual ductal hyperplasia, left breast lumpectomy 07/27/19 for IDC, s/p radiation 09/07/19-10/06/19; s/p left mastectomy for recurrent 11/26/21), RUL lung cancer (s/p bronchoscopy 12/27/20, RUL FNA + non-small cell carcinoma, s/p SBRT), osteoarthritis (left TKA 01/28/11, right TKA 02/17/12).  Last visit with cardiologist [name] was on 06/14/22 for follow-up fatigue and HTN, bradycardia. HTN improved. Bradycardia stable, but if fatigue persistent consider stopping b-blocker. Not on statin give advanced age and concerns of fatigue. Fatigue, some better on Vitamin D supplement, and felt mult-factorial including bradycardia, obesity, deconditioning, vitamin D deficiency, and radiation. Consider sleep study. Six month follow-up planned. She is no longer on b-blocker therapy. She had a nondiagnostic but considered overall low risk CCTA on 05/12/19.  04/02/20 echo showed LVEF 60-65%, no regional wall motion abnormalities, grade 2 diastolic dysfunction, normal RV systolic function, normal PASP, trivial MR/AI.  She had a CBC and CMP on 09/01/2022.  Presurgical COVID-19 test is in process.  She is a same-day work-up, so anesthesia team to evaluate on the day of surgery.   VS:  BP Readings from  Last 3 Encounters:  09/01/22 (!) 143/44  08/14/22 134/76  08/12/22 (!) (P) 166/50   Pulse Readings from Last 3 Encounters:  09/01/22 (!) 52  08/14/22 (!) 52  08/12/22 (P) 62     PROVIDERS: [name], MD is PCP Glenetta Hew, MD is cardiologist Baltazar Apo, MD is pulmonologist Benay Pike, MD is Zonia Kief, MD is RAD-ONC   LABS: Most recent lab results in [place] include: Lab Results  Component Value Date   WBC 8.3 09/01/2022   HGB 10.7 (L) 09/01/2022   HCT 32.1 (L) 09/01/2022   PLT 282 09/01/2022   GLUCOSE 86 09/01/2022   ALT 10 09/01/2022   AST 16 09/01/2022   NA 140 09/01/2022   K 3.6 09/01/2022   CL 105 09/01/2022   CREATININE 0.88 09/01/2022   BUN 21 09/01/2022   CO2 27 09/01/2022   TSH 2.260 04/29/2022   INR 1.73 (H) 02/21/2012   HGBA1C 5.3 11/22/2021    PFTs 02/04/21: FVC 1.68 (86%), post 1.67 (86%). FEV1 1.42 (95%), 1.46 (98%). DLCO unc 14.57 (77%).   IMAGES: PET Scan 08/29/22: IMPRESSION: 1. 8 mm right upper lobe nodule, borderline in size for PET resolution but visualized and approaching mediastinal blood pool activity, indicative of disease recurrence. 2. Additional pulmonary nodules are too small for PET resolution and appears similar to 06/03/2021. Recommend attention on follow-up. 3.  Aortic atherosclerosis (ICD10-I70.0).  CT Chest 08/09/22: IMPRESSION: 1. Unchanged post treatment/post radiation fibrosis and consolidation of the apical right upper lobe adjacent to fiducial markers. 2. Continued enlargement of a nodule just inferiorly in the right upper lobe, which now measures 0.9 x 0.7 cm, previously 0.6 cm. This is highly concerning for a slowly growing metastasis. 3. Additional small nodules in  the inferior peripheral right upper lobe and right lower lobe are unchanged and nonspecific. 4. Coronary artery disease. - Aortic Atherosclerosis (ICD10-I70.0).    EKG: 11/22/21: Sinus bradycardia at 57 bpm Otherwise normal  ECG When compared with ECG of 02-Jul-2018 10:03, No significant change since last tracing Confirmed by Candee Furbish [phone]) on 11/23/2021 7:56:12 AM   CV: Echo 04/02/20: IMPRESSIONS   1. Left ventricular ejection fraction, by estimation, is 60 to 65%. The  left ventricle has normal function. The left ventricle has no regional  wall motion abnormalities. There is moderate asymmetric left ventricular  hypertrophy. Left ventricular  diastolic parameters are consistent with Grade II diastolic dysfunction  (pseudonormalization). Elevated left ventricular end-diastolic pressure.   2. Right ventricular systolic function is normal. The right ventricular  size is normal. There is normal pulmonary artery systolic pressure. The  estimated right ventricular systolic pressure is 62.8 mmHg.   3. Left atrial size was moderately dilated.   4. The mitral valve is normal in structure. Trivial mitral valve  regurgitation. No evidence of mitral stenosis.   5. The aortic valve is tricuspid. Aortic valve regurgitation is trivial.  Mild aortic valve sclerosis is present, with no evidence of aortic valve  stenosis.   6. The inferior vena cava is normal in size with greater than 50%  respiratory variability, suggesting right atrial pressure of 3 mmHg.      CT Coronary 05/12/19: Calcium Score: Only one small area of ostial LAD calcium noted Coronary Arteries: Right dominant with no anomalies LM: Normal LAD: 1-24% ostial calcified stenosis. Portion of distal LAD not well visualized D1: Normal D2: Normal IM: Normal Circumflex: Normal OM1: Normal OM2: Normal RCA: Portion of the ostial/ proximal RCA hard to visualize due to motion artifact otherwise normal PDA: Normal PLA: Normal  IMPRESSION: 1.  Calcium score only 11 which is 28 th percentile for age and sex 2.  Normal aortic root 3.1 cm 3. Non diagnostic study do to severe motion artifact. Portion of the ostial RCA and distal LAD not well  visualized. However study would be considered low risk due to minimum calcium (only on non obstructive foci in ostial LAD. And no disease in remainder of visualized segments    Long term monitor 05/04/19-05/18/19: Predominant rhythm is sinus with a minimum heart rate of 36 bpm, maximum heart rate 88 bpm. 7 runs of PAT/PSVT noted 6-8 beats. Fastest heart rate 162 bpm. Rare PACs with minimal PVCs noted. Patient noted lightheadedness with rates ranging from 49 to 61 bpm in sinus rhythm/sinus bradycardia. Slowest heart rate noted 36 bpm at 5:40 PM. 38 bpm at 11:36 AM   Based on these findings, would appear that the slowest heart rate episodes were all noted while she was still taking diltiazem according to plan.  Further bradycardia episodes were not recorded once she was no longer taking diltiazem   Although there were SVT/PAT runs, would recommend continue to hold AV nodal agents to avoid worsening bradycardia.   Past Medical History:  Diagnosis Date   Arthritis    Asthma    Never smoker   Blood transfusion    over 40 y ears   Cancer ([place])    COVID 2020   n/v/diarrhea   Dysrhythmia    Bradycardia per Dr. Ameliah Baskins [place] note on 04/2022   History of radiation therapy 09/07/19- 10/06/19   Left Breast 15 fractions of 2.67 Gy to total 40.05 Gy. Left breast boost 5 fractions of 2 Gy each to total 10 Gy  Hypertension    on meds   Hypothyroidism    on meds   Malignant neoplasm of left breast metastatic to lung ([place]) 06/22/2019   a) 9/'20 ->  Upper-Outer Quadrant of Left Breast: Clinical stage from 06/22/2019: Stage IB (cT1b, cN0, cM0, G3, ER-, PR-, HER2-) => L Lumpectomy & XRT - (f/u Path 11/21: Stage 1B (pT1b, pN0,cM0; 1/'23: Recurrent Upper-Inner Quadrant ( Clinical Stage cT1, cN0, cM0) -mastectomy; now treated with XRT.   Malignant neoplasm of right upper lobe of lung ([place]) 01/23/2021   Stage IB (cT1c, cN0, cM0, G2, ER-, PR-, HER2-   Personal history of radiation therapy    Pre-diabetes      Past Surgical History:  Procedure Laterality Date   ABDOMINAL HYSTERECTOMY     32 yrs   AXILLARY SENTINEL NODE BIOPSY Left 11/26/2021   Procedure: LEFT AXILLARY SENTINEL NODE BIOPSY;  Surgeon: Stark Klein, MD;  Location: [facility];  Service: General;  Laterality: Left;   BREAST LUMPECTOMY Left 08/11/2016   BREAST LUMPECTOMY Left 07/27/2019   BREAST LUMPECTOMY WITH RADIOACTIVE SEED AND SENTINEL LYMPH NODE BIOPSY Left 07/27/2019   Procedure: LEFT BREAST LUMPECTOMY WITH RADIOACTIVE SEED AND LEFT SENTINEL LYMPH NODE MAPPING;  Surgeon: Erroll Luna, MD;  Location: [facility];  Service: General;  Laterality: Left;   BREAST LUMPECTOMY WITH RADIOACTIVE SEED LOCALIZATION Left 08/11/2016   Procedure: LEFT BREAST LUMPECTOMY WITH RADIOACTIVE SEED LOCALIZATION;  Surgeon: Erroll Luna, MD;  Location: [facility];  Service: General;  Laterality: Left;   BRONCHIAL BIOPSY  12/27/2020   Procedure: BRONCHIAL BIOPSIES;  Surgeon: Collene Gobble, MD;  [facility];  Service: Pulmonary;;   BRONCHIAL BRUSHINGS  12/27/2020   Procedure: BRONCHIAL BRUSHINGS;  Surgeon: Collene Gobble, MD;  Location: [facility] ENDOSCOPY;  Service: Pulmonary;;   BRONCHIAL NEEDLE ASPIRATION BIOPSY  12/27/2020   Procedure: BRONCHIAL NEEDLE ASPIRATION BIOPSIES;  Surgeon: Collene Gobble, MD;  Location: [facility];  Service: Pulmonary;;   BRONCHIAL WASHINGS  12/27/2020   Procedure: BRONCHIAL WASHINGS;  Surgeon: Collene Gobble, MD;  Location: [facility] [facility];  Service: Pulmonary;;   CATARACT EXTRACTION, BILATERAL     CORONARY CALCIUM SCORE-CT ANGIOGRAM  04/2019   Small, < 67m nodule noted.  Difficult study to read.  Coronary calcium score was 11.  Significant motion artifact, but visible territory showed minimal disease.  Minimal calcium noted in the ostial LAD but no suggestion of significant CAD.   EYE SURGERY     FIDUCIAL MARKER PLACEMENT  12/27/2020   Procedure: FIDUCIAL MARKER PLACEMENT;  Surgeon: BCollene Gobble  MD;  [facility];  Service: Pulmonary;;   JOINT REPLACEMENT Bilateral    left knee 2012   KNEE ARTHROPLASTY  02/17/2012   Procedure: COMPUTER ASSISTED TOTAL KNEE ARTHROPLASTY;  Surgeon: GMeredith Pel MD;  [facility]  Service: Orthopedics;  Laterality: Right;  Right total knee replacement   MASTECTOMY W/ SENTINEL NODE BIOPSY Left 11/26/2021   Procedure: LEFT BREAST MASTECTOMY;  Surgeon: BStark Klein MD;  [facility]  Service: General;  Laterality: Left;   TRANSTHORACIC ECHOCARDIOGRAM  04/02/2020   Normal LV function.  No R WMA.  Moderate LVH.  GRII DD with elevated filling pressures.  Moderate LA dilation with trivial MR.  Mild aortic valve sclerosis.   VIDEO BRONCHOSCOPY WITH ENDOBRONCHIAL NAVIGATION N/A 12/27/2020   Procedure: VIDEO BRONCHOSCOPY WITH ENDOBRONCHIAL NAVIGATION;  Surgeon: BCollene Gobble MD;  [facility];  Service: Pulmonary;  Laterality: N/A;    MEDICATIONS: No current  facility-administered medications for this encounter.    albuterol (PROVENTIL HFA;VENTOLIN HFA) 108 (90 Base) MCG/ACT inhaler   amLODipine (NORVASC) 10 MG tablet   Artificial Tear Solution (SOOTHE XP OP)   Biotin 5000 MCG TABS   cetaphil (CETAPHIL) cream   chlorthalidone (HYGROTON) 25 MG tablet   fluticasone (FLONASE) 50 MCG/ACT nasal spray   furosemide (LASIX) 20 MG tablet   levothyroxine (SYNTHROID, LEVOTHROID) 88 MCG tablet   meclizine (ANTIVERT) 12.5 MG tablet   Multiple Vitamins-Minerals (PRESERVISION AREDS 2+MULTI VIT) CAPS   potassium chloride SA (KLOR-CON) 20 MEQ tablet   Probiotic Product (ALIGN) 4 MG CAPS   valsartan (DIOVAN) 320 MG tablet   clindamycin (CLEOCIN) 300 MG capsule    Myra Gianotti, PA-C Surgical Short Stay/Anesthesiology [facility] Phone [phone] [facility] Phone [phone] 09/05/2022 12:47 PM

## 2022-09-05 NOTE — Progress Notes (Signed)
Spoke with pt for pre-op call. Pt states she sees [name] for an irregular heart rate. LOV was 05/19/22. In his note, he states she has bradycardia and Hypertensive heart disease. Pt states that the heart rate is fine now. Denies any recent chest pain or shortness of breath.   Pt states she is not diabetic.   Covid test done yesterday, results pending.  Shower instructions given to pt and she voiced understanding.

## 2022-09-06 LAB — SPECIMEN STATUS REPORT

## 2022-09-06 LAB — NOVEL CORONAVIRUS, NAA: SARS-CoV-2, NAA: NOT DETECTED

## 2022-09-07 ENCOUNTER — Telehealth: Payer: Self-pay | Admitting: Emergency Medicine

## 2022-09-07 NOTE — Telephone Encounter (Signed)
Attempted to call patient to inform her that Dr Valeta Harms will be performing navigational bronchoscopy on 11/20 because I will be unavailable. Will try her again.

## 2022-09-07 NOTE — Telephone Encounter (Signed)
Left pt a VM explaining that I would be unavailable for Bronchoscopy 11/20, that I have rescheduled with Dr Valeta Harms.

## 2022-09-08 ENCOUNTER — Encounter: Payer: Self-pay | Admitting: Emergency Medicine

## 2022-09-08 ENCOUNTER — Telehealth: Payer: Self-pay

## 2022-09-08 ENCOUNTER — Ambulatory Visit (HOSPITAL_COMMUNITY): Admission: RE | Admit: 2022-09-08 | Payer: Medicare HMO | Source: Home / Self Care | Admitting: Pulmonary Disease

## 2022-09-08 ENCOUNTER — Telehealth: Payer: Self-pay | Admitting: *Deleted

## 2022-09-08 HISTORY — DX: Cardiac arrhythmia, unspecified: I49.9

## 2022-09-08 SURGERY — BRONCHOSCOPY, WITH BIOPSY USING ELECTROMAGNETIC NAVIGATION
Anesthesia: General | Laterality: Right

## 2022-09-08 NOTE — Telephone Encounter (Signed)
See if she can reschedule for 12/11

## 2022-09-08 NOTE — Telephone Encounter (Signed)
Per note in the chart pt did not want to get bronch today with someone else - wants to wait for Dr Lamonte Sakai.  Dr Lamonte Sakai when do you want me to schedule this patient?

## 2022-09-08 NOTE — Telephone Encounter (Signed)
Pt has been scheduled for 12/11.  I spoke to her and gave her info.  Nothing further needed for this message.

## 2022-09-08 NOTE — Telephone Encounter (Signed)
Pt called RN to inquire about rescheduling her bronchoscopy that was scheduled for today. She stated she still wanted Dr. Brock Ra to perform this procedure and not the Md that was supposed to do it today. Rn Anderson Malta sent an in [employer] with high importance to Dr. Lamonte Sakai and his staff. Pt stated she had tried to contact the bronchoscopy department without success as well. Rn Anderson Malta called pt back to update her that communication had been started with the Md office today.

## 2022-09-09 ENCOUNTER — Telehealth: Payer: Self-pay

## 2022-09-09 NOTE — Telephone Encounter (Addendum)
Made patient aware of message below patient voice understanding.    ----- Message from Alla Feeling, NP sent at 09/09/2022 10:01 AM EST ----- Please let pt know her vitamin D has improved from 4 months ago, normal now. I recommend she continue oral Vit D, at least 2000 IU daily Thanks, Regan Rakers, NP

## 2022-09-16 ENCOUNTER — Ambulatory Visit
Admission: RE | Admit: 2022-09-16 | Discharge: 2022-09-16 | Disposition: A | Discharge status: Home / Self Care | Payer: Medicare HMO | Source: Ambulatory Visit | Attending: Hematology and Oncology | Admitting: Hematology and Oncology

## 2022-09-16 DIAGNOSIS — Z171 Estrogen receptor negative status [ER-]: Secondary | ICD-10-CM

## 2022-09-16 DIAGNOSIS — Z853 Personal history of malignant neoplasm of breast: Secondary | ICD-10-CM | POA: Diagnosis not present

## 2022-09-16 DIAGNOSIS — N6489 Other specified disorders of breast: Secondary | ICD-10-CM | POA: Diagnosis not present

## 2022-09-22 DIAGNOSIS — H40053 Ocular hypertension, bilateral: Secondary | ICD-10-CM | POA: Diagnosis not present

## 2022-09-22 DIAGNOSIS — H402231 Chronic angle-closure glaucoma, bilateral, mild stage: Secondary | ICD-10-CM | POA: Diagnosis not present

## 2022-09-25 ENCOUNTER — Other Ambulatory Visit: Payer: Medicare HMO

## 2022-09-25 ENCOUNTER — Other Ambulatory Visit: Payer: Self-pay | Admitting: *Deleted

## 2022-09-25 DIAGNOSIS — Z01812 Encounter for preprocedural laboratory examination: Secondary | ICD-10-CM

## 2022-09-26 ENCOUNTER — Other Ambulatory Visit: Payer: Self-pay

## 2022-09-26 ENCOUNTER — Encounter (HOSPITAL_COMMUNITY): Payer: Self-pay | Admitting: Emergency Medicine

## 2022-09-26 NOTE — Progress Notes (Addendum)
Alisha [name] denies chest pain or shortness of breath.  Patient denies having any s/s of Covid in her household, also denies any known exposure to Covid.  Alisha [name]'s PCP is Dr A. Orland Mustard, cardiologistt is  [name].

## 2022-09-27 LAB — NOVEL CORONAVIRUS, NAA: SARS-CoV-2, NAA: NOT DETECTED

## 2022-09-29 ENCOUNTER — Ambulatory Visit (HOSPITAL_COMMUNITY): Payer: Medicare HMO

## 2022-09-29 ENCOUNTER — Ambulatory Visit (HOSPITAL_BASED_OUTPATIENT_CLINIC_OR_DEPARTMENT_OTHER): Payer: Medicare HMO | Admitting: Anesthesiology

## 2022-09-29 ENCOUNTER — Ambulatory Visit (HOSPITAL_COMMUNITY): Payer: Medicare HMO | Admitting: Anesthesiology

## 2022-09-29 ENCOUNTER — Ambulatory Visit (HOSPITAL_COMMUNITY)
Admission: RE | Admit: 2022-09-29 | Discharge: 2022-09-29 | Disposition: A | Discharge status: Home / Self Care | Payer: Medicare HMO | Attending: Emergency Medicine | Admitting: Emergency Medicine

## 2022-09-29 ENCOUNTER — Encounter (HOSPITAL_COMMUNITY)
Admission: RE | Disposition: A | Discharge status: Home / Self Care | Payer: Self-pay | Source: Home / Self Care | Attending: Emergency Medicine

## 2022-09-29 ENCOUNTER — Encounter (HOSPITAL_COMMUNITY): Payer: Self-pay | Admitting: Emergency Medicine

## 2022-09-29 ENCOUNTER — Other Ambulatory Visit: Payer: Self-pay

## 2022-09-29 DIAGNOSIS — D649 Anemia, unspecified: Secondary | ICD-10-CM | POA: Diagnosis not present

## 2022-09-29 DIAGNOSIS — C3411 Malignant neoplasm of upper lobe, right bronchus or lung: Secondary | ICD-10-CM | POA: Diagnosis not present

## 2022-09-29 DIAGNOSIS — I509 Heart failure, unspecified: Secondary | ICD-10-CM | POA: Insufficient documentation

## 2022-09-29 DIAGNOSIS — D759 Disease of blood and blood-forming organs, unspecified: Secondary | ICD-10-CM | POA: Diagnosis not present

## 2022-09-29 DIAGNOSIS — I110 Hypertensive heart disease with heart failure: Secondary | ICD-10-CM

## 2022-09-29 DIAGNOSIS — Z853 Personal history of malignant neoplasm of breast: Secondary | ICD-10-CM | POA: Diagnosis not present

## 2022-09-29 DIAGNOSIS — D638 Anemia in other chronic diseases classified elsewhere: Secondary | ICD-10-CM | POA: Diagnosis not present

## 2022-09-29 DIAGNOSIS — Z9889 Other specified postprocedural states: Secondary | ICD-10-CM | POA: Diagnosis not present

## 2022-09-29 DIAGNOSIS — R918 Other nonspecific abnormal finding of lung field: Secondary | ICD-10-CM | POA: Diagnosis not present

## 2022-09-29 DIAGNOSIS — R911 Solitary pulmonary nodule: Secondary | ICD-10-CM | POA: Diagnosis present

## 2022-09-29 DIAGNOSIS — I517 Cardiomegaly: Secondary | ICD-10-CM | POA: Diagnosis not present

## 2022-09-29 DIAGNOSIS — E119 Type 2 diabetes mellitus without complications: Secondary | ICD-10-CM | POA: Diagnosis not present

## 2022-09-29 DIAGNOSIS — E039 Hypothyroidism, unspecified: Secondary | ICD-10-CM | POA: Insufficient documentation

## 2022-09-29 DIAGNOSIS — Q765 Cervical rib: Secondary | ICD-10-CM | POA: Diagnosis not present

## 2022-09-29 DIAGNOSIS — J984 Other disorders of lung: Secondary | ICD-10-CM | POA: Diagnosis not present

## 2022-09-29 DIAGNOSIS — J45909 Unspecified asthma, uncomplicated: Secondary | ICD-10-CM | POA: Diagnosis not present

## 2022-09-29 HISTORY — DX: Heart failure, unspecified: I50.9

## 2022-09-29 HISTORY — PX: BRONCHIAL BIOPSY: SHX5109

## 2022-09-29 HISTORY — PX: BRONCHIAL BRUSHINGS: SHX5108

## 2022-09-29 HISTORY — PX: BRONCHIAL WASHINGS: SHX5105

## 2022-09-29 HISTORY — PX: VIDEO BRONCHOSCOPY WITH RADIAL ENDOBRONCHIAL ULTRASOUND: SHX6849

## 2022-09-29 HISTORY — PX: BRONCHIAL NEEDLE ASPIRATION BIOPSY: SHX5106

## 2022-09-29 HISTORY — PX: FIDUCIAL MARKER PLACEMENT: SHX6858

## 2022-09-29 SURGERY — BRONCHOSCOPY, WITH BIOPSY USING ELECTROMAGNETIC NAVIGATION
Anesthesia: General | Laterality: Right

## 2022-09-29 MED ORDER — ROCURONIUM BROMIDE 10 MG/ML (PF) SYRINGE
PREFILLED_SYRINGE | INTRAVENOUS | Status: DC | PRN
  Administered 2022-09-29: 80 mg via INTRAVENOUS

## 2022-09-29 MED ORDER — PROPOFOL 10 MG/ML IV BOLUS
INTRAVENOUS | Status: DC | PRN
  Administered 2022-09-29: 90 mg via INTRAVENOUS
  Administered 2022-09-29: 10 mg via INTRAVENOUS

## 2022-09-29 MED ORDER — LIDOCAINE 2% (20 MG/ML) 5 ML SYRINGE
INTRAMUSCULAR | Status: DC | PRN
  Administered 2022-09-29 (×2): 20 mg via INTRAVENOUS

## 2022-09-29 MED ORDER — FENTANYL CITRATE (PF) 100 MCG/2ML IJ SOLN
INTRAMUSCULAR | Status: AC
  Filled 2022-09-29: qty 2

## 2022-09-29 MED ORDER — LACTATED RINGERS IV SOLN: INTRAVENOUS | Status: DC

## 2022-09-29 MED ORDER — ONDANSETRON HCL 4 MG/2ML IJ SOLN
INTRAMUSCULAR | Status: DC | PRN
  Administered 2022-09-29: 4 mg via INTRAVENOUS

## 2022-09-29 MED ORDER — DEXAMETHASONE SODIUM PHOSPHATE 10 MG/ML IJ SOLN
INTRAMUSCULAR | Status: DC | PRN
  Administered 2022-09-29: 10 mg via INTRAVENOUS

## 2022-09-29 MED ORDER — CHLORHEXIDINE GLUCONATE 0.12 % MT SOLN: 15.0000 mL | Freq: Once | OROMUCOSAL | Status: AC

## 2022-09-29 MED ORDER — AMISULPRIDE (ANTIEMETIC) 5 MG/2ML IV SOLN
INTRAVENOUS | Status: AC
  Administered 2022-09-29: 10 mg via INTRAVENOUS
  Filled 2022-09-29: qty 4

## 2022-09-29 MED ORDER — SUGAMMADEX SODIUM 200 MG/2ML IV SOLN
INTRAVENOUS | Status: DC | PRN
  Administered 2022-09-29: 400 mg via INTRAVENOUS

## 2022-09-29 MED ORDER — AMISULPRIDE (ANTIEMETIC) 5 MG/2ML IV SOLN: 10.0000 mg | Freq: Once | INTRAVENOUS | Status: AC

## 2022-09-29 MED ORDER — CHLORHEXIDINE GLUCONATE 0.12 % MT SOLN
OROMUCOSAL | Status: AC
  Administered 2022-09-29: 15 mL via OROMUCOSAL
  Filled 2022-09-29: qty 15

## 2022-09-29 SURGICAL SUPPLY — 1 items: fiducial IMPLANT

## 2022-09-29 NOTE — Anesthesia Procedure Notes (Signed)
Procedure Name: Intubation Date/Time: 09/29/2022 10:03 AM  Performed by: Reeves Dam, CRNAPre-anesthesia Checklist: Patient identified, Patient being monitored, Timeout performed, Emergency Drugs available and Suction available Patient Re-evaluated:Patient Re-evaluated prior to induction Oxygen Delivery Method: Circle system utilized Preoxygenation: Pre-oxygenation with 100% oxygen Induction Type: IV induction Ventilation: Mask ventilation without difficulty Laryngoscope Size: 3 and Miller Grade View: Grade I Tube type: Oral Tube size: 8.5 mm Number of attempts: 1 Airway Equipment and Method: Stylet Placement Confirmation: ETT inserted through vocal cords under direct vision, positive ETCO2 and breath sounds checked- equal and bilateral Secured at: 22 cm Tube secured with: Tape Dental Injury: Teeth and Oropharynx as per pre-operative assessment

## 2022-09-29 NOTE — Discharge Instructions (Addendum)
Flexible Bronchoscopy, Care After This sheet gives you information about how to care for yourself after your test. Your doctor may also give you more specific instructions. If you have problems or questions, contact your doctor. Follow these instructions at home: Eating and drinking When your numbness is gone and your cough and gag reflexes have come back, you may: Eat only soft foods. Slowly drink liquids. The day after the test, go back to your normal diet. Driving Do not drive for 24 hours if you were given a medicine to help you relax (sedative). Do not drive or use heavy machinery while taking prescription pain medicine. General instructions  Take over-the-counter and prescription medicines only as told by your doctor. Return to your normal activities as told. Ask what activities are safe for you. Do not use any products that have nicotine or tobacco in them. This includes cigarettes and e-cigarettes. If you need help quitting, ask your doctor. Keep all follow-up visits as told by your doctor. This is important. It is very important if you had a tissue sample (biopsy) taken. Get help right away if: You have shortness of breath that gets worse. You get light-headed. You feel like you are going to pass out (faint). You have chest pain. You cough up: More than a little blood. More blood than before. Summary Do not eat or drink anything (not even water) for 2 hours after your test, or until your numbing medicine wears off. Do not use cigarettes. Do not use e-cigarettes. Get help right away if you have chest pain.  Please call our office for any questions or concerns.  [phone].  This information is not intended to replace advice given to you by your health care provider. Make sure you discuss any questions you have with your health care provider. Document Released: 08/03/2009 Document Revised: 09/18/2017 Document Reviewed: 10/24/2016 Elsevier Patient Education  2020 Elsevier  Inc.  

## 2022-09-29 NOTE — Anesthesia Postprocedure Evaluation (Signed)
Anesthesia Post Note  Patient: Alisha [name]  Procedure(s) Performed: ROBOTIC ASSISTED NAVIGATIONAL BRONCHOSCOPY (Right) BRONCHIAL BIOPSIES BRONCHIAL NEEDLE ASPIRATION BIOPSIES BRONCHIAL BRUSHINGS VIDEO BRONCHOSCOPY WITH RADIAL ENDOBRONCHIAL ULTRASOUND FIDUCIAL MARKER PLACEMENT BRONCHIAL WASHINGS     Patient location during evaluation: PACU Anesthesia Type: General Level of consciousness: awake and alert Pain management: pain level controlled Vital Signs Assessment: post-procedure vital signs reviewed and stable Respiratory status: spontaneous breathing, nonlabored ventilation, respiratory function stable and patient connected to nasal cannula oxygen Cardiovascular status: blood pressure returned to baseline and stable Postop Assessment: no apparent nausea or vomiting Anesthetic complications: no  No notable events documented.  Last Vitals:  Vitals:   09/29/22 1145 09/29/22 1200  BP: (!) 162/64 (!) 153/71  Pulse: 60 (!) 58  Resp: 16 15  Temp:  36.6 C  SpO2: 97% 98%    Last Pain:  Vitals:   09/29/22 1200  TempSrc:   PainSc: 0-No pain                 [name] L Zaya Kessenich

## 2022-09-29 NOTE — Anesthesia Preprocedure Evaluation (Addendum)
Anesthesia Evaluation  Patient identified by MRN, date of birth, ID band Patient awake    Reviewed: Allergy & Precautions, NPO status , Patient's Chart, lab work & pertinent test results, reviewed documented beta blocker date and time   Airway Mallampati: II  TM Distance: >3 FB Neck ROM: Full    Dental  (+) Edentulous Upper, Dental Advisory Given, Missing,    Pulmonary asthma    Pulmonary exam normal breath sounds clear to auscultation       Cardiovascular hypertension, Pt. on home beta blockers and Pt. on medications +CHF  Normal cardiovascular exam+ dysrhythmias  Rhythm:Regular Rate:Normal  TTE 2021  1. Left ventricular ejection fraction, by estimation, is 60 to 65%. The  left ventricle has normal function. The left ventricle has no regional  wall motion abnormalities. There is moderate asymmetric left ventricular  hypertrophy. Left ventricular  diastolic parameters are consistent with Grade II diastolic dysfunction  (pseudonormalization). Elevated left ventricular end-diastolic pressure.   2. Right ventricular systolic function is normal. The right ventricular  size is normal. There is normal pulmonary artery systolic pressure. The  estimated right ventricular systolic pressure is 61.6 mmHg.   3. Left atrial size was moderately dilated.   4. The mitral valve is normal in structure. Trivial mitral valve  regurgitation. No evidence of mitral stenosis.   5. The aortic valve is tricuspid. Aortic valve regurgitation is trivial.  Mild aortic valve sclerosis is present, with no evidence of aortic valve  stenosis.   6. The inferior vena cava is normal in size with greater than 50%  respiratory variability, suggesting right atrial pressure of 3 mmHg     Neuro/Psych negative neurological ROS  negative psych ROS   GI/Hepatic negative GI ROS, Neg liver ROS,,,  Endo/Other  diabetesHypothyroidism    Renal/GU negative Renal ROS   negative genitourinary   Musculoskeletal  (+) Arthritis ,    Abdominal   Peds  Hematology  (+) Blood dyscrasia, anemia   Anesthesia Other Findings   Reproductive/Obstetrics                             Anesthesia Physical Anesthesia Plan  ASA: 3  Anesthesia Plan: General   Post-op Pain Management: Minimal or no pain anticipated   Induction: Intravenous  PONV Risk Score and Plan: 3 and Dexamethasone, Ondansetron and Treatment may vary due to age or medical condition  Airway Management Planned: Oral ETT  Additional Equipment:   Intra-op Plan:   Post-operative Plan: Extubation in OR  Informed Consent: I have reviewed the patients History and Physical, chart, labs and discussed the procedure including the risks, benefits and alternatives for the proposed anesthesia with the patient or authorized representative who has indicated his/her understanding and acceptance.     Dental advisory given  Plan Discussed with: CRNA  Anesthesia Plan Comments:        Anesthesia Quick Evaluation

## 2022-09-29 NOTE — Op Note (Signed)
Video Bronchoscopy with Robotic Assisted Bronchoscopic Navigation   Date of Operation: 09/29/2022   Pre-op Diagnosis: Right upper lobe and right middle lobe nodules  Post-op Diagnosis: Same  Surgeon: Baltazar Apo  Assistants: None  Anesthesia: General endotracheal anesthesia  Operation: Flexible video fiberoptic bronchoscopy with robotic assistance and biopsies.  Estimated Blood Loss: Minimal  Complications: None  Indications and History: Alisha [name] is a 87 y.o. female with history of non-small cell lung cancer treated with SBRT.  She is found to have a new small right upper lobe nodule adjacent to her treated region, also small right middle lobe nodules.  Recommendation made to achieve a tissue diagnosis via robotic assisted navigational bronchoscopy. The risks, benefits, complications, treatment options and expected outcomes were discussed with the patient.  The possibilities of pneumothorax, pneumonia, reaction to medication, pulmonary aspiration, perforation of a viscus, bleeding, failure to diagnose a condition and creating a complication requiring transfusion or operation were discussed with the patient who freely signed the consent.    Description of Procedure: The patient was seen in the Preoperative Area, was examined and was deemed appropriate to proceed.  The patient was taken to [facility] endoscopy room 3, identified as Edgardo Roys Haffey and the procedure verified as Flexible Video Fiberoptic Bronchoscopy.  A Time Out was held and the above information confirmed.   Prior to the date of the procedure a high-resolution CT scan of the chest was performed. Utilizing ION software program a virtual tracheobronchial tree was generated to allow the creation of distinct navigation pathways to the patient's parenchymal abnormalities. After being taken to the operating room general anesthesia was initiated and the patient  was orally intubated. The video fiberoptic bronchoscope was  introduced via the endotracheal tube and a general inspection was performed which showed normal right and left lung anatomy. Aspiration of the bilateral mainstems was completed to remove any remaining secretions. Robotic catheter inserted into patient's endotracheal tube.   Target #1 right upper lobe pulmonary nodule: The distinct navigation pathways prepared prior to this procedure were then utilized to navigate to patient's lesion identified on CT scan. The robotic catheter was secured into place and the vision probe was withdrawn.  Lesion location was approximated using fluoroscopy and radial endobronchial ultrasound for peripheral targeting. Under fluoroscopic guidance transbronchial needle brushings, transbronchial needle biopsies, and transbronchial forceps biopsies were performed to be sent for cytology and pathology.   Target #2 right middle lobe bilobed pulmonary nodule: The distinct navigation pathways prepared prior to this procedure were then utilized to navigate to patient's lesion identified on CT scan.  The more inferior aspect of the bilobed nodule was targeted. The robotic catheter was secured into place and the vision probe was withdrawn.  Lesion location was approximated using fluoroscopy and radial endobronchial ultrasound for peripheral targeting.  Local registration and targeting was performed using Cios three-dimensional imaging.  Under fluoroscopic guidance transbronchial needle brushings, transbronchial needle biopsies, and transbronchial forceps biopsies were performed to be sent for cytology and pathology.  Under fluoroscopic guidance a single fiducial marker was placed adjacent to the nodules.  A bronchioalveolar lavage was performed in the right middle lobe adjacent to the nodules and sent for cytology.  At the end of the procedure a general airway inspection was performed and there was no evidence of active bleeding. The bronchoscope was removed.  The patient tolerated the  procedure well. There was no significant blood loss and there were no obvious complications. A post-procedural chest x-ray is pending.  Samples  Target #1: 1. Transbronchial needle brushings from right upper lobe pulmonary nodule 2. Transbronchial Wang needle biopsies from right upper lobe pulmonary nodule 3. Transbronchial forceps biopsies from right upper lobe pulmonary nodule  Samples Target #2: 1. Transbronchial needle brushings from right middle lobe pulmonary nodules 2. Transbronchial Wang needle biopsies from right middle lobe pulmonary nodules 3. Transbronchial forceps biopsies from right middle lobe pulmonary nodules 4. Bronchoalveolar lavage from right middle lobe  Plans:  The patient will be discharged from the PACU to home when recovered from anesthesia and after chest x-ray is reviewed. We will review the cytology, pathology and microbiology results with the patient when they become available. Outpatient followup will be with Dr. Lamonte Sakai, Dr. Julien Nordmann.   Baltazar Apo, MD, PhD 09/29/2022, 11:06 AM [facility] [phone] or if no answer before 7:00PM call [phone] For any issues after 7:00PM please call eLink [phone]

## 2022-09-29 NOTE — H&P (Signed)
Alisha [name] is an 87 y.o. female.   Chief Complaint: Pulmonary nodules HPI: 87 year old never smoker with a history of left breast cancer and stage Ia squamous cell lung cancer of the right upper lobe that was treated with SBRT (01/2021).  She has been found to have a second 9 mm right upper lobe nodule that is enlarging on serial imaging, had mild hypermetabolism on PET scan.  Also smaller lateral right upper lobe nodules, 6 mm.  Concern for possible lung cancer recurrence or breast cancer metastatic disease.  Recommendation made to achieve tissue diagnosis via robotic assisted navigational bronchoscopy. She presents today feeling well, no new issues.  No dyspnea, no chest pain or cough.  Past Medical History:  Diagnosis Date   Arthritis    Asthma    Never smoker   Blood transfusion    over 43 y ears   Cancer [facility])    CHF (congestive heart failure) ([place])    diastolic   COVID 7510   n/v/diarrhea   Dysrhythmia    Bradycardia per Dr. Allison Quarry note on 04/2022   History of radiation therapy 09/07/19- 10/06/19   Left Breast 15 fractions of 2.67 Gy to total 40.05 Gy. Left breast boost 5 fractions of 2 Gy each to total 10 Gy   Hypertension    on meds   Hypothyroidism    on meds   Malignant neoplasm of left breast metastatic to lung ([place]) 06/22/2019   a) 9/'20 ->  Upper-Outer Quadrant of Left Breast: Clinical stage from 06/22/2019: Stage IB (cT1b, cN0, cM0, G3, ER-, PR-, HER2-) => L Lumpectomy & XRT - (f/u Path 11/21: Stage 1B (pT1b, pN0,cM0; 1/'23: Recurrent Upper-Inner Quadrant ( Clinical Stage cT1, cN0, cM0) -mastectomy; now treated with XRT.   Malignant neoplasm of right upper lobe of lung ([place]) 01/23/2021   Stage IB (cT1c, cN0, cM0, G2, ER-, PR-, HER2-   Personal history of radiation therapy    Pre-diabetes     Past Surgical History:  Procedure Laterality Date   ABDOMINAL HYSTERECTOMY     32 yrs   AXILLARY SENTINEL NODE BIOPSY Left 11/26/2021   Procedure: LEFT AXILLARY SENTINEL  NODE BIOPSY;  Surgeon: Stark Klein, MD;  Location: [place];  Service: General;  Laterality: Left;   BREAST LUMPECTOMY Left 08/11/2016   BREAST LUMPECTOMY Left 07/27/2019   BREAST LUMPECTOMY WITH RADIOACTIVE SEED AND SENTINEL LYMPH NODE BIOPSY Left 07/27/2019   Procedure: LEFT BREAST LUMPECTOMY WITH RADIOACTIVE SEED AND LEFT SENTINEL LYMPH NODE MAPPING;  Surgeon: Erroll Luna, MD;  Location: [place];  Service: General;  Laterality: Left;   BREAST LUMPECTOMY WITH RADIOACTIVE SEED LOCALIZATION Left 08/11/2016   Procedure: LEFT BREAST LUMPECTOMY WITH RADIOACTIVE SEED LOCALIZATION;  Surgeon: Erroll Luna, MD;  Location: [place];  Service: General;  Laterality: Left;   BRONCHIAL BIOPSY  12/27/2020   Procedure: BRONCHIAL BIOPSIES;  Surgeon: Collene Gobble, MD;  Location: [facility] ENDOSCOPY;  Service: Pulmonary;;   BRONCHIAL BRUSHINGS  12/27/2020   Procedure: BRONCHIAL BRUSHINGS;  Surgeon: Collene Gobble, MD;  Location: [facility] ENDOSCOPY;  Service: Pulmonary;;   BRONCHIAL NEEDLE ASPIRATION BIOPSY  12/27/2020   Procedure: BRONCHIAL NEEDLE ASPIRATION BIOPSIES;  Surgeon: Collene Gobble, MD;  Location: [place];  Service: Pulmonary;;   BRONCHIAL WASHINGS  12/27/2020   Procedure: BRONCHIAL WASHINGS;  Surgeon: Collene Gobble, MD;  Location: [place] ENDOSCOPY;  Service: Pulmonary;;   CATARACT EXTRACTION, BILATERAL     CORONARY CALCIUM SCORE-CT ANGIOGRAM  04/2019   Small, < 51m  nodule noted.  Difficult study to read.  Coronary calcium score was 11.  Significant motion artifact, but visible territory showed minimal disease.  Minimal calcium noted in the ostial LAD but no suggestion of significant CAD.   EYE SURGERY     FIDUCIAL MARKER PLACEMENT  12/27/2020   Procedure: FIDUCIAL MARKER PLACEMENT;  Surgeon: Collene Gobble, MD;  Location: [facility] ENDOSCOPY;  Service: Pulmonary;;   JOINT REPLACEMENT Bilateral    left knee 2012   KNEE ARTHROPLASTY  02/17/2012   Procedure: COMPUTER ASSISTED TOTAL KNEE  ARTHROPLASTY;  Surgeon: Meredith Pel, MD;  Location: [place];  Service: Orthopedics;  Laterality: Right;  Right total knee replacement   MASTECTOMY W/ SENTINEL NODE BIOPSY Left 11/26/2021   Procedure: LEFT BREAST MASTECTOMY;  Surgeon: Stark Klein, MD;  Location: [place];  Service: General;  Laterality: Left;   TRANSTHORACIC ECHOCARDIOGRAM  04/02/2020   Normal LV function.  No R WMA.  Moderate LVH.  GRII DD with elevated filling pressures.  Moderate LA dilation with trivial MR.  Mild aortic valve sclerosis.   VIDEO BRONCHOSCOPY WITH ENDOBRONCHIAL NAVIGATION N/A 12/27/2020   Procedure: VIDEO BRONCHOSCOPY WITH ENDOBRONCHIAL NAVIGATION;  Surgeon: Collene Gobble, MD;  Location: [place] ENDOSCOPY;  Service: Pulmonary;  Laterality: N/A;    Family History  Problem Relation Age of Onset   Other Mother        died @ 34 - but was healthy   Hypertension Sister    Diabetes Sister    Diabetes Brother        no longer on medications.    Anesthesia problems Neg Hx    Hypotension Neg Hx    Malignant hyperthermia Neg Hx    Pseudochol deficiency Neg Hx    Social History:  reports that she has never smoked. She has never used smokeless tobacco. She reports that she does not drink alcohol and does not use drugs.  Allergies:  Allergies  Allergen Reactions   Amlodipine Other (See Comments)    Other reaction(s): swelling, ha, itching   Benicar [Olmesartan Medoxomil] Other (See Comments)    Unknown   Doxepin Hcl     Other reaction(s): Body swelling   Estradiol     Other reaction(s): itching   Gabapentin     Other reaction(s): swelling   Levofloxacin     Other reaction(s): Unknown   Lisinopril Itching    No energy Other reaction(s): itching, hair loss   Nebivolol Hcl     Other reaction(s): fatigue   Olmesartan     Other reaction(s): hair loss, nail changes   Tramadol Hcl     Other reaction(s): abdominal pain   Penicillins Itching and Rash    Has patient had a PCN reaction causing immediate rash,  facial/tongue/throat swelling, SOB or lightheadedness with hypotension: No Has patient had a PCN reaction causing severe rash involving mucus membranes or skin necrosis: No Has patient had a PCN reaction that required hospitalization: No Has patient had a PCN reaction occurring within the last 10 years: No If all of the above answers are "NO", then may proceed with Cephalosporin use.   Sulfa Antibiotics Itching and Rash    Other reaction(s): Unknown    Medications Prior to Admission  Medication Sig Dispense Refill   albuterol (PROVENTIL HFA;VENTOLIN HFA) 108 (90 Base) MCG/ACT inhaler Inhale 1-2 puffs into the lungs every 6 (six) hours as needed for wheezing or shortness of breath.      amLODipine (NORVASC) 10 MG tablet TAKE ONE TABLET  BY MOUTH AT NOON Needs appointment for further refills 90 tablet 1   Artificial Tear Solution (SOOTHE XP OP) Place 2 drops into both eyes 2 (two) times daily.     Biotin 5000 MCG TABS Take 10,000 mcg by mouth daily at 12 noon.     cetaphil (CETAPHIL) cream Apply 1 application  topically daily as needed (dry skin).     chlorthalidone (HYGROTON) 25 MG tablet TAKE ONE TABLET BY MOUTH AT NOON 90 tablet 1   clindamycin (CLEOCIN) 300 MG capsule Take 600 mg by mouth See admin instructions. Take before dental appointments     fluticasone (FLONASE) 50 MCG/ACT nasal spray Place 2 sprays into both nostrils daily as needed for allergies.     furosemide (LASIX) 20 MG tablet Take 20 mg by mouth daily as needed for fluid or edema.     levothyroxine (SYNTHROID, LEVOTHROID) 88 MCG tablet Take 88 mcg by mouth daily before breakfast.      meclizine (ANTIVERT) 12.5 MG tablet Take 12.5 mg by mouth 3 (three) times daily as needed for dizziness.     Multiple Vitamins-Minerals (PRESERVISION AREDS 2+MULTI VIT) CAPS Take 1 capsule by mouth in the morning and at bedtime.     potassium chloride SA (KLOR-CON) 20 MEQ tablet Take 20 mEq by mouth daily as needed (when taking furosemide).      Probiotic Product (ALIGN) 4 MG CAPS Take 2 capsules by mouth daily. gummy     valsartan (DIOVAN) 320 MG tablet Take 1 tablet (320 mg total) by mouth daily at 12 noon. Will need an appointment to for future refill 30 tablet 0    No results found for this or any previous visit (from the past 48 hour(s)). No results found.  Review of Systems As per HPI Blood pressure 110/73, pulse (!) 57, temperature 98.7 F (37.1 C), temperature source Oral, resp. rate 17, height _0  (1.626 m), weight 85.7 kg, SpO2 99 %. Physical Exam  Gen: Pleasant, well-nourished, in no distress,  normal affect  ENT: No lesions,  mouth clear,  oropharynx clear, no postnasal drip  Neck: No JVD, no stridor  Lungs: No use of accessory muscles, no crackles or wheezing on normal respiration, no wheeze on forced expiration  Cardiovascular: RRR, heart sounds normal, no murmur or gallops, no peripheral edema  Abdomen: soft and NT, no HSM,  BS normal  Musculoskeletal: No deformities, no cyanosis or clubbing  Neuro: alert, awake, non focal  Skin: Warm, no lesions or rashes    Assessment/Plan Right upper lobe pulmonary nodules unclear etiology but concerning for possible malignancy.  She presents today for robotic assisted navigational bronchoscopy.  We should be able to approach the 9 mm nodule, possibly 6 mm smaller more lateral nodules as well.  Rationale, risks, benefits discussed with the patient and she fully understands.  Elected to proceed.  Collene Gobble, MD 09/29/2022, 7:37 AM

## 2022-09-29 NOTE — Transfer of Care (Addendum)
Immediate Anesthesia Transfer of Care Note  Patient: Alisha [name]  Procedure(s) Performed: ROBOTIC ASSISTED NAVIGATIONAL BRONCHOSCOPY (Right) BRONCHIAL BIOPSIES BRONCHIAL NEEDLE ASPIRATION BIOPSIES BRONCHIAL BRUSHINGS VIDEO BRONCHOSCOPY WITH RADIAL ENDOBRONCHIAL ULTRASOUND FIDUCIAL MARKER PLACEMENT BRONCHIAL WASHINGS  Patient Location: PACU  Anesthesia Type:General  Level of Consciousness: awake and alert   Airway & Oxygen Therapy: Patient Spontanous Breathing and Patient connected to face mask oxygen  Post-op Assessment: Report given to RN, Post -op Vital signs reviewed and stable, and Patient moving all extremities X 4  Post vital signs: Reviewed and stable  Last Vitals:  Vitals Value Taken Time  BP 160/65 09/29/22 1116  Temp    Pulse 60 09/29/22 1120  Resp 12 09/29/22 1120  SpO2 98 % 09/29/22 1120  Vitals shown include unvalidated device data.  Last Pain:  Vitals:   09/29/22 0743  TempSrc:   PainSc: 0-No pain      Patients Stated Pain Goal: 0 (55/20/80 2233)  Complications: No notable events documented.

## 2022-10-02 ENCOUNTER — Ambulatory Visit (INDEPENDENT_AMBULATORY_CARE_PROVIDER_SITE_OTHER): Payer: Medicare HMO | Admitting: Emergency Medicine

## 2022-10-02 ENCOUNTER — Encounter (HOSPITAL_COMMUNITY): Payer: Self-pay | Admitting: Emergency Medicine

## 2022-10-02 VITALS — BP 132/70 | HR 57 | Temp 98.1°F | Ht 64.0 in | Wt 199.4 lb

## 2022-10-02 DIAGNOSIS — C3490 Malignant neoplasm of unspecified part of unspecified bronchus or lung: Secondary | ICD-10-CM | POA: Diagnosis not present

## 2022-10-02 LAB — CYTOLOGY - NON PAP

## 2022-10-02 NOTE — Patient Instructions (Addendum)
We reviewed your bronchoscopy results from 09/29/2022.  These show evidence for recurrent squamous cell lung cancer in the right upper lobe nodule and also the right middle lobe nodules. We will refer you to see Dr. Isidore Moos with radiation oncology to talk about the options for treatment. We will also notify Dr. Chryl Heck about these results Follow Dr. Lamonte Sakai in 6 months or sooner if any problems.

## 2022-10-02 NOTE — Progress Notes (Signed)
Subjective:    Patient ID: Alisha [name], female    DOB: 1935-07-01, 87 y.o.   MRN: [id number]  HPI 87 yo never smoker, hx L breast CA, stage Ia squamous cell lung cancer of the RUL post SBRT 01/2021, remote asthma.  She is followed closely and oncology and radiation oncology.  She has a second right upper lobe nodule that is slowly increasing in size as below.  Referred now to consider next steps.  A PET scan has been planned. She is feeling well. Denies any resp sx or active asthma sx.   CT scan of the chest 08/09/2022 reviewed by me shows unchanged posttreatment right upper lobe fibrosis without any evidence of recurrence at that site.  There is a second right upper lobe nodule just inferior which is increasing in size, 9 mm from 6 mm concerning for second primary or metastatic disease  ROV 10/02/22 --follow-up visit for 87 year old woman with a history of left breast cancer, right upper lobe stage I squamous cell lung cancer that was treated with SBRT 01/2021 and remote asthma.  I saw her for a slowly enlarging new right upper lobe nodule as well as a smaller bilobed nodule in the right middle lobe.  She underwent navigational bronchoscopy 09/29/2022.  She returns today to discuss the results.  Cytology shows squamous cell lung cancer in the right upper lobe nodule, the samples from the right middle lobe were deemed suspicious for malignancy but not fully diagnostic.   Review of Systems As per HPI  Past Medical History:  Diagnosis Date   Arthritis    Asthma    Never smoker   Blood transfusion    over 41 y ears   Cancer [facility])    CHF (congestive heart failure) ([place])    diastolic   COVID 9476   n/v/diarrhea   Dysrhythmia    Bradycardia per Dr. Allison Quarry note on 04/2022   History of radiation therapy 09/07/19- 10/06/19   Left Breast 15 fractions of 2.67 Gy to total 40.05 Gy. Left breast boost 5 fractions of 2 Gy each to total 10 Gy   Hypertension    on meds   Hypothyroidism     on meds   Malignant neoplasm of left breast metastatic to lung ([place]) 06/22/2019   a) 9/'20 ->  Upper-Outer Quadrant of Left Breast: Clinical stage from 06/22/2019: Stage IB (cT1b, cN0, cM0, G3, ER-, PR-, HER2-) => L Lumpectomy & XRT - (f/u Path 11/21: Stage 1B (pT1b, pN0,cM0; 1/'23: Recurrent Upper-Inner Quadrant ( Clinical Stage cT1, cN0, cM0) -mastectomy; now treated with XRT.   Malignant neoplasm of right upper lobe of lung ([place]) 01/23/2021   Stage IB (cT1c, cN0, cM0, G2, ER-, PR-, HER2-   Personal history of radiation therapy    Pre-diabetes      Family History  Problem Relation Age of Onset   Other Mother        died @ 68 - but was healthy   Hypertension Sister    Diabetes Sister    Diabetes Brother        no longer on medications.    Anesthesia problems Neg Hx    Hypotension Neg Hx    Malignant hyperthermia Neg Hx    Pseudochol deficiency Neg Hx      Social History   Socioeconomic History   Marital status: Single    Spouse name: Not on file   Number of children: Not on file   Years of education: Not on file  Highest education level: Not on file  Occupational History   Not on file  Tobacco Use   Smoking status: Never   Smokeless tobacco: Never  Vaping Use   Vaping Use: Never used  Substance and Sexual Activity   Alcohol use: No   Drug use: No   Sexual activity: Not Currently  Other Topics Concern   Not on file  Social History Narrative   No longer exercising - since "got tired" - previously did elliptical, exercise bike etc.    While working - used worked gym      Diet: Continues to work on her diet - enjoys vegetables, beans, chicken, fish, occasional homemade burger (1x/mo), salad - does not add salt to food       Drinks: 1 cup of coffee/day, 6 8-oz glasses of water per day        Exercise: Increased exercise to 30 minutes per day - using weights, exercise bike, and treadmill   Social Determinants of Health   Financial Resource Strain: Not on file   Food Insecurity: Not on file  Transportation Needs: No Transportation Needs (08/19/2019)   PRAPARE - Hydrologist (Medical): No    Lack of Transportation (Non-Medical): No  Physical Activity: Not on file  Stress: Not on file  Social Connections: Not on file  Intimate Partner Violence: Not At Risk (08/19/2019)   Humiliation, Afraid, Rape, and Kick questionnaire    Fear of Current or Ex-Partner: No    Emotionally Abused: No    Physically Abused: No    Sexually Abused: No     Allergies  Allergen Reactions   Amlodipine Other (See Comments)    Other reaction(s): swelling, ha, itching   Benicar [Olmesartan Medoxomil] Other (See Comments)    Unknown   Doxepin Hcl     Other reaction(s): Body swelling   Estradiol     Other reaction(s): itching   Gabapentin     Other reaction(s): swelling   Levofloxacin     Other reaction(s): Unknown   Lisinopril Itching    No energy Other reaction(s): itching, hair loss   Nebivolol Hcl     Other reaction(s): fatigue   Olmesartan     Other reaction(s): hair loss, nail changes   Tramadol Hcl     Other reaction(s): abdominal pain   Penicillins Itching and Rash    Has patient had a PCN reaction causing immediate rash, facial/tongue/throat swelling, SOB or lightheadedness with hypotension: No Has patient had a PCN reaction causing severe rash involving mucus membranes or skin necrosis: No Has patient had a PCN reaction that required hospitalization: No Has patient had a PCN reaction occurring within the last 10 years: No If all of the above answers are "NO", then may proceed with Cephalosporin use.   Sulfa Antibiotics Itching and Rash    Other reaction(s): Unknown     Outpatient Medications Prior to Visit  Medication Sig Dispense Refill   albuterol (PROVENTIL HFA;VENTOLIN HFA) 108 (90 Base) MCG/ACT inhaler Inhale 1-2 puffs into the lungs every 6 (six) hours as needed for wheezing or shortness of breath.       amLODipine (NORVASC) 10 MG tablet TAKE ONE TABLET BY MOUTH AT NOON Needs appointment for further refills 90 tablet 1   Artificial Tear Solution (SOOTHE XP OP) Place 2 drops into both eyes 2 (two) times daily.     Biotin 5000 MCG TABS Take 10,000 mcg by mouth daily at 12 noon.  cetaphil (CETAPHIL) cream Apply 1 application  topically daily as needed (dry skin).     chlorthalidone (HYGROTON) 25 MG tablet TAKE ONE TABLET BY MOUTH AT NOON 90 tablet 1   clindamycin (CLEOCIN) 300 MG capsule Take 600 mg by mouth See admin instructions. Take before dental appointments     fluticasone (FLONASE) 50 MCG/ACT nasal spray Place 2 sprays into both nostrils daily as needed for allergies.     furosemide (LASIX) 20 MG tablet Take 20 mg by mouth daily as needed for fluid or edema.     levothyroxine (SYNTHROID, LEVOTHROID) 88 MCG tablet Take 88 mcg by mouth daily before breakfast.      meclizine (ANTIVERT) 12.5 MG tablet Take 12.5 mg by mouth 3 (three) times daily as needed for dizziness.     Multiple Vitamins-Minerals (PRESERVISION AREDS 2+MULTI VIT) CAPS Take 1 capsule by mouth in the morning and at bedtime.     potassium chloride SA (KLOR-CON) 20 MEQ tablet Take 20 mEq by mouth daily as needed (when taking furosemide).     Probiotic Product (ALIGN) 4 MG CAPS Take 2 capsules by mouth daily. gummy     valsartan (DIOVAN) 320 MG tablet Take 1 tablet (320 mg total) by mouth daily at 12 noon. Will need an appointment to for future refill 30 tablet 0   No facility-administered medications prior to visit.         Objective:   Physical Exam Vitals:   10/02/22 1328  BP: 132/70  Pulse: (!) 57  Temp: 98.1 F (36.7 C)  TempSrc: Oral  SpO2: 98%  Weight: 199 lb 6.4 oz (90.4 kg)  Height: _0  (1.626 m)    Gen: Pleasant, overwt woman, in no distress,  normal affect  ENT: No lesions,  mouth clear,  oropharynx clear, no postnasal drip  Neck: No JVD, some mild UA noise  Lungs: No use of accessory muscles, no  crackles or wheezing on normal respiration, no wheeze on forced expiration  Cardiovascular: RRR, heart sounds normal, no murmur or gallops, no peripheral edema  Musculoskeletal: No deformities, no cyanosis or clubbing  Neuro: alert, awake, non focal  Skin: Warm, no lesions or rash      Assessment & Plan:  Malignant neoplasm of upper lobe of right lung (HCC) Her transbronchial biopsies confirm squamous cell lung cancer in the satellite nodule near her previous treatment site in the right upper lobe.  The small bilobed right middle lobe nodule is also suspicious for malignancy.  She wants to go back to Dr. Isidore Moos to discuss the results, see if radiation is a possible plan.  We will make that referral.  We reviewed your bronchoscopy results from 09/29/2022.  These show evidence for recurrent squamous cell lung cancer in the right upper lobe nodule and also the right middle lobe nodules. We will refer you to see Dr. Isidore Moos with radiation oncology to talk about the options for treatment. We will also notify Dr. Chryl Heck about these results Follow Dr. Lamonte Sakai in 6 months or sooner if any problems.  Baltazar Apo, MD, PhD 10/02/2022, 1:48 PM [facility] [phone] or if no answer before 7:00PM call [phone] For any issues after 7:00PM please call eLink [phone]

## 2022-10-02 NOTE — Assessment & Plan Note (Signed)
Her transbronchial biopsies confirm squamous cell lung cancer in the satellite nodule near her previous treatment site in the right upper lobe.  The small bilobed right middle lobe nodule is also suspicious for malignancy.  She wants to go back to Dr. Isidore Moos to discuss the results, see if radiation is a possible plan.  We will make that referral.  We reviewed your bronchoscopy results from 09/29/2022.  These show evidence for recurrent squamous cell lung cancer in the right upper lobe nodule and also the right middle lobe nodules. We will refer you to see Dr. Isidore Moos with radiation oncology to talk about the options for treatment. We will also notify Dr. Chryl Heck about these results Follow Dr. Lamonte Sakai in 6 months or sooner if any problems.

## 2022-10-03 ENCOUNTER — Encounter: Payer: Self-pay | Admitting: Podiatry

## 2022-10-03 ENCOUNTER — Ambulatory Visit: Payer: Medicare HMO | Admitting: Podiatry

## 2022-10-03 VITALS — BP 153/56

## 2022-10-03 DIAGNOSIS — M79674 Pain in right toe(s): Secondary | ICD-10-CM

## 2022-10-03 DIAGNOSIS — Q828 Other specified congenital malformations of skin: Secondary | ICD-10-CM

## 2022-10-03 DIAGNOSIS — M79675 Pain in left toe(s): Secondary | ICD-10-CM | POA: Diagnosis not present

## 2022-10-03 DIAGNOSIS — G629 Polyneuropathy, unspecified: Secondary | ICD-10-CM | POA: Diagnosis not present

## 2022-10-03 DIAGNOSIS — B351 Tinea unguium: Secondary | ICD-10-CM

## 2022-10-03 NOTE — Progress Notes (Unsigned)
  Subjective:  Patient ID: Alisha [name], female    DOB: 03/05/1935,  MRN: [id number]  Alisha [name] presents to clinic today for {jgcomplaint:23593}  Chief Complaint  Patient presents with   Nail Problem    RFC PCP-Morrow,Aaron PCP VST-3 Months ago   New problem(s): None. {jgcomplaint:23593}  PCP is [name], MD.  Allergies  Allergen Reactions   Amlodipine Other (See Comments)    Other reaction(s): swelling, ha, itching   Benicar [Olmesartan Medoxomil] Other (See Comments)    Unknown   Doxepin Hcl     Other reaction(s): Body swelling   Estradiol     Other reaction(s): itching   Gabapentin     Other reaction(s): swelling   Levofloxacin     Other reaction(s): Unknown   Lisinopril Itching    No energy Other reaction(s): itching, hair loss   Nebivolol Hcl     Other reaction(s): fatigue   Olmesartan     Other reaction(s): hair loss, nail changes   Tramadol Hcl     Other reaction(s): abdominal pain   Penicillins Itching and Rash    Has patient had a PCN reaction causing immediate rash, facial/tongue/throat swelling, SOB or lightheadedness with hypotension: No Has patient had a PCN reaction causing severe rash involving mucus membranes or skin necrosis: No Has patient had a PCN reaction that required hospitalization: No Has patient had a PCN reaction occurring within the last 10 years: No If all of the above answers are "NO", then may proceed with Cephalosporin use.   Sulfa Antibiotics Itching and Rash    Other reaction(s): Unknown    Review of Systems: Negative except as noted in the HPI.  Objective: No changes noted in today's physical examination. Vitals:   10/03/22 1033  BP: (!) 153/56   Alisha [name] is a pleasant 87 y.o. female in NAD. AAO x 3.  Vascular CFT <3 seconds b/l LE. Palpable pedal pulses b/l LE. Pedal hair sparse. No pain with calf compression b/l. No edema noted b/l LE. No cyanosis or clubbing noted b/l LE.  Neurologic  Normal speech. Oriented to person, place, and time. Pt has subjective symptoms of neuropathy.Protective sensation intact 5/5 intact bilaterally with 10g monofilament b/l.  Dermatologic Pedal skin warm and supple b/l.  No open wounds b/l. No interdigital macerations. Toenails 1-5 b/l elongated, thickened, discolored with subungual debris. +Tenderness with dorsal palpation of nailplates. Porokeratotic lesion(s) noted submet head 5 right foot.  Orthopedic: Muscle strength 5/5 to all lower extremity muscle groups bilaterally. No pain, crepitus or joint limitation noted with ROM bilateral LE. Hammertoe(s) noted to the left great toe, L 2nd toe, and L 3rd toe.   Radiographs: None Assessment/Plan: 1. Pain due to onychomycosis of toenails of both feet   2. Porokeratosis   3. Neuropathy     No orders of the defined types were placed in this encounter.   None {Jgplan:23602::"-Patient/POA to call should there be question/concern in the interim."}   Return in about 3 months (around 01/02/2023).  Marzetta Board, DPM

## 2022-10-03 NOTE — Progress Notes (Signed)
Thoracic Location of Tumor / Histology:  Squamous cell carcinoma of lung, unspecified laterality,  Malignant neoplasm of upper lobe of right lung  Patient presented with symptoms of:  Since last visit she had a PET imaging which showed 8 mm RUL nodule, borderline in size for PET resolution, no other major concerns. Sheis currently scheduled for navigational bronchoscopy by Dr Lamonte Sakai on 11.20. I will send a message to Dr Isidore Moos as well   Biopsies revealed:  Clinical History: Right upper lobe pulmonary nodule  Specimen Submitted:  E. LUNG, RML, LAVAGE:    FINAL MICROSCOPIC DIAGNOSIS:  - No malignant cells identified  SPECIMEN ADEQUACY:  Satisfactory but limited for evaluation, scant cellularity  GROSS:  Received is/are 15cc's of bloody red color fluid. (TC:tc)  Smears:2  Concentration Method (ThinPrep): 0  Cell Block: 0  Additional Studies: NAV (A, B, C, D) [phone]  Final Diagnosis performed by Jaquita Folds, MD.   Electronically FINAL MICROSCOPIC DIAGNOSIS:   A. LUNG, RUL, FINE NEEDLE ASPIRATION:  - Malignant cells consistent with non-small cell carcinoma, see comment   B. LUNG, RUL, BRUSHING:  - Malignant cells consistent with non-small cell carcinoma   C. LUNG, RML, BRUSHING:  - Suspicious for malignancy   D. LUNG, RML, FINE NEEDLE ASPIRATION:  - No malignant cells identified   COMMENT:  The tumor cells show patchy staining with CK5/6 immunostain while  immunostains for TTF-1 and p63 are negative.  The immunoprofile appears  most consistent with squamous cell carcinoma.  Dr. Arby Barrette reviewed the  case and concurs with the above diagnosis.  Dr. Lamonte Sakai was notified on  10/02/2022.  signed 10/02/2022   Tobacco/Marijuana/Snuff/ETOH use:   Past/Anticipated interventions by cardiothoracic surgery, if any:  Video Bronchoscopy with Robotic Assisted Bronchoscopic Navigation    Date of Operation: 09/29/2022    Pre-op Diagnosis: Right upper lobe and right middle lobe  nodules   Post-op Diagnosis: Same   Surgeon: Baltazar Apo   Assistants: None   Anesthesia: General endotracheal anesthesia   Operation: Flexible video fiberoptic bronchoscopy with robotic assistance and biopsies.   Estimated Blood Loss: Minimal   Complications: None    Assessment & Plan:  Malignant neoplasm of upper lobe of right lung (HCC) Her transbronchial biopsies confirm squamous cell lung cancer in the satellite nodule near her previous treatment site in the right upper lobe.  The small bilobed right middle lobe nodule is also suspicious for malignancy.  She wants to go back to Dr. Isidore Moos to discuss the results, see if radiation is a possible plan.  We will make that referral.   We reviewed your bronchoscopy results from 09/29/2022.  These show evidence for recurrent squamous cell lung cancer in the right upper lobe nodule and also the right middle lobe nodules. We will refer you to see Dr. Isidore Moos with radiation oncology to talk about the options for treatment. We will also notify Dr. Chryl Heck about these results Follow Dr. Lamonte Sakai in 6 months or sooner if any problems.   Baltazar Apo, MD, PhD 10/02/2022, 1:48 PM [facility] [phone] or if no answer before 7:00PM call [phone] For any issues after 7:00PM please call eLink [phone]             Past/Anticipated interventions by medical oncology, if any:  Dr. Chryl Heck on 09-01-22 LUNG CANCER CT of the chest 11/14/2020 showed a new (as compared to July 2020) right upper lobe nodule measuring 0.9 cm, with no other findings of concern             (  a) PET scan 12/07/2020 find the right upper lobe nodule in question to be hypermetabolic, with no other hypermetabolic areas elsewhere in the chest or abdomen             (b) CA 27-29 and CEA obtained 12/20/2020 were in the normal range             (c) bronchoscopic biopsy 12/27/2020 read as consistent with squamous cell carcinoma   Radiation to lung  with curative intent 02/13/2021 through 02/20/2021 Site Technique Total Dose (Gy) Dose per Fx (Gy) Completed Fx Beam Energies  Lung, Right: Lung_Rt_RUL IMRT 54/54 18 3/3 [id number]   ASSESSMENT:    87 y.o. [employer], [place] with recently diagnosed left breast upper outer quadrant invasive ductal carcinoma, triple negative who is here for follow-up.  Please refer to oncological history for complete history.  Her last breast cancer back in 2020 was in the left breast at 12 o'clock position again IDC triple negative.  At that time she had surgery, adjuvant radiation, no adjuvant chemotherapy.  Since then she also had lung cancer which was treated with SBRT with excellent response.  She then had an abnormal mammogram of the left breast which showed a 1 cm tumor, triple negative on the initial biopsy, grade 2.  Discussion was to consider upfront surgery given her age and she is now status post left mastectomy.   Final pathology showed 2 cm, grade 2 tumor, prognostics not repeated, initial prognostics ER 0%, PR 0% and HER2 negative. Given triple negative pathology and tumor greater than 5 mm, we have discussed about adjuvant docetaxel and cyclophosphamide every 21 days for 4 cycles especially since she has robust performance status. We have discussed about adverse effects of chemotherapy including but not limited to fatigue, nausea, vomiting, diarrhea, increased risk of cytopenias, neuropathy, infections etc.   She declined adjuvant chemotherapy. Since last visit she had a PET imaging which showed 8 mm RUL nodule, borderline in size for PET resolution, no other major concerns. Sheis currently scheduled for navigational bronchoscopy by Dr Lamonte Sakai on 11.20. I will send a message to Dr Isidore Moos as well .   Mammogram and  left axillary [place], recommended by Dr Barry Dienes scheduled for Nov. At this time, no concerns for breast cancer recurrence.  Signs/Symptoms Weight changes, if any: {:18581} Respiratory complaints, if any:  {:18581} Hemoptysis, if any: {:18581} Pain issues, if any:  {:18581}  SAFETY ISSUES: Prior radiation? {:18581} Pacemaker/ICD? {:18581}  Possible current pregnancy?{:18581} Is the patient on methotrexate? {:18581}  Current Complaints / other details:  ***

## 2022-10-05 ENCOUNTER — Other Ambulatory Visit: Payer: Self-pay | Admitting: Cardiology

## 2022-10-06 NOTE — Progress Notes (Signed)
Radiation Oncology         [phone]) [phone] ________________________________  Outpatient Re-Consultation  Name: Alisha [name] MRN: [id number]  Date: 10/07/2022  DOB: 03/18/1935  CC:[name], MD  Collene Gobble, MD   REFERRING PHYSICIAN: Collene Gobble, MD  DIAGNOSIS: No diagnosis found.  Recurrent non-small carcinoma of the RUL as well as evidence of malignancy in the RML   Cancer Staging  Malignant neoplasm of upper lobe of right lung [facility]) Staging form: Lung, AJCC 8th Edition - Clinical stage from 01/23/2021: cT1, cN0, cM0 - Signed by Eppie Gibson, MD on 01/23/2021 Stage prefix: Initial diagnosis Laterality: Right  Malignant neoplasm of upper-inner quadrant of left breast in female, estrogen receptor negative ([place]) Staging form: Breast, AJCC 8th Edition - Clinical stage from 10/29/2021: Stage IB (cT1c, cN0, cM0, G2, ER-, PR-, HER2-) - Signed by Gardenia Phlegm, NP on 11/06/2021 Stage prefix: Initial diagnosis Histologic grading system: 3 grade system  Malignant neoplasm of upper-outer quadrant of left breast in female, estrogen receptor negative ([place]) Staging form: Breast, AJCC 8th Edition - Clinical stage from 06/22/2019: Stage IB (cT1b, cN0, cM0, G3, ER-, PR-, HER2-) - Signed by Gardenia Phlegm, NP on 06/29/2019 Stage prefix: Initial diagnosis Histologic grading system: 3 grade system - Pathologic: Stage IB (pT1b, pN0, cM0, G3, ER-, PR-, HER2-) - Signed by Gardenia Phlegm, NP on 08/26/2020 Stage prefix: Initial diagnosis Histologic grading system: 3 grade system   CHIEF COMPLAINT: Here to discuss management of recurrent right lung cancer  HISTORY OF PRESENT ILLNESS::Alisha [name] is a 87 y.o. female who returns today for re-evaluation and for an opinion concerning further radiation therapy in management of her recent recurrence of squamous cell lung cancer in the right upper lobe and right middle lobe. I last met with the patient for  follow-up of radiation to the right upper lung on 08/12/22. To review, the patient has a known second RUL nodule that has displayed a gradual increase in size over time. Her most recent chest CT at the time (performed on 08/09/22) showed the nodule to increase in size from 6 mm to 9 mm, concerning for a second primary or metastatic disease.   Since her last visit, the patient accordingly followed up with Dr. Lamonte Sakai on 08/14/22 who recommended proceeding with bronchoscopy pending an upcoming restaging PET scan.   Subsequent restaging PET scan on 08/29/22 showed the known right upper lung nodule (measuring approximately 8 mm) approaching mediastinal blood pool activity, and thus indicative of disease recurrence (SUV max of 2.4). PET also showed additional pulmonary nodules which were too small for PET resolution but otherwise appeared similar in size to prior imaging from 06/03/21.    Dr. Barry Dienes and Dr. Chryl Heck also recommended diagnostic imaging of the breasts to rule out any evidence of metastatic breast cancer in the setting of her enlarging RUL nodule (and for routine follow-up purposes).   Right breast diagnostic mammogram and left breast axillary ultrasound on 09/16/22 showed no evidence of malignancy within the right breast or evidence of lymphadenopathy within the left axilla.   The patient opted to proceed with bronchoscopy for biopsies of the RUL on 09/29/22 under the care of Dr. Lamonte Sakai. Cytology unfortunately revealed malignant cells consistent with non-small carcinoma of the RUL. RML brushings also sent for cytology showed findings suspicious for malignancy. RML FNA and lavage however showed no evidence of malignancy.   Of note: post-bronchoscopy chest x-ray on 09/29/22 showed cardiomegaly and increased markings in both lower lung  fields (left greater than right), suggestive of subsegmental atelectasis vs early pneumonia. A possible small left pleural effusion was also appreciated.   PREVIOUS  RADIATION THERAPY: Yes   2) Intent: Curative Radiation Treatment Dates: 02/13/2021 through 02/20/2021 Site Technique Total Dose (Gy) Dose per Fx (Gy) Completed Fx Beam Energies  Lung, Right: Lung_Rt_RUL IMRT 54/54 18 3/3 6XFFF    1? Diagnosis: Stage IB Left Breast UOQ Invasive Ductal Carcinoma, ER- / PR- / Her2-, Grade 3  Intent: Curative Radiation Treatment Dates: 09/07/2019 through 10/06/2019 Site Technique Total Dose (Gy) Dose per Fx (Gy) Completed Fx Beam Energies  Breast, Left: Breast_Lt 3D 40.05/40.05 2.67 15/15 6X, 10X  Breast, Left: Breast_Lt_Bst specialPort 10/10 2 5/5 15E    PAST MEDICAL HISTORY:  has a past medical history of Arthritis, Asthma, Blood transfusion, Cancer ([place]), CHF (congestive heart failure) ([place]), COVID (2020), Dysrhythmia, History of radiation therapy (09/07/19- 10/06/19), Hypertension, Hypothyroidism, Malignant neoplasm of left breast metastatic to lung ([place]) (06/22/2019), Malignant neoplasm of right upper lobe of lung ([place]) (01/23/2021), Personal history of radiation therapy, and Pre-diabetes.    PAST SURGICAL HISTORY: Past Surgical History:  Procedure Laterality Date   ABDOMINAL HYSTERECTOMY     32 yrs   AXILLARY SENTINEL NODE BIOPSY Left 11/26/2021   Procedure: LEFT AXILLARY SENTINEL NODE BIOPSY;  Surgeon: Stark Klein, MD;  Location: [place];  Service: General;  Laterality: Left;   BREAST LUMPECTOMY Left 08/11/2016   BREAST LUMPECTOMY Left 07/27/2019   BREAST LUMPECTOMY WITH RADIOACTIVE SEED AND SENTINEL LYMPH NODE BIOPSY Left 07/27/2019   Procedure: LEFT BREAST LUMPECTOMY WITH RADIOACTIVE SEED AND LEFT SENTINEL LYMPH NODE MAPPING;  Surgeon: Erroll Luna, MD;  Location: [place];  Service: General;  Laterality: Left;   BREAST LUMPECTOMY WITH RADIOACTIVE SEED LOCALIZATION Left 08/11/2016   Procedure: LEFT BREAST LUMPECTOMY WITH RADIOACTIVE SEED LOCALIZATION;  Surgeon: Erroll Luna, MD;  Location: [place];  Service: General;  Laterality:  Left;   BRONCHIAL BIOPSY  12/27/2020   Procedure: BRONCHIAL BIOPSIES;  Surgeon: Collene Gobble, MD;  Location: [facility] ENDOSCOPY;  Service: Pulmonary;;   BRONCHIAL BIOPSY  09/29/2022   Procedure: BRONCHIAL BIOPSIES;  Surgeon: Collene Gobble, MD;  Location: [facility] ENDOSCOPY;  Service: Pulmonary;;   BRONCHIAL BRUSHINGS  12/27/2020   Procedure: BRONCHIAL BRUSHINGS;  Surgeon: Collene Gobble, MD;  Location: [facility] ENDOSCOPY;  Service: Pulmonary;;   BRONCHIAL BRUSHINGS  09/29/2022   Procedure: BRONCHIAL BRUSHINGS;  Surgeon: Collene Gobble, MD;  Location: [facility] ENDOSCOPY;  Service: Pulmonary;;   BRONCHIAL NEEDLE ASPIRATION BIOPSY  12/27/2020   Procedure: BRONCHIAL NEEDLE ASPIRATION BIOPSIES;  Surgeon: Collene Gobble, MD;  Location: [facility] ENDOSCOPY;  Service: Pulmonary;;   BRONCHIAL NEEDLE ASPIRATION BIOPSY  09/29/2022   Procedure: BRONCHIAL NEEDLE ASPIRATION BIOPSIES;  Surgeon: Collene Gobble, MD;  Location: [facility] ENDOSCOPY;  Service: Pulmonary;;   BRONCHIAL WASHINGS  12/27/2020   Procedure: BRONCHIAL WASHINGS;  Surgeon: Collene Gobble, MD;  Location: [place];  Service: Pulmonary;;   BRONCHIAL WASHINGS  09/29/2022   Procedure: BRONCHIAL WASHINGS;  Surgeon: Collene Gobble, MD;  Location: [place];  Service: Pulmonary;;   CATARACT EXTRACTION, BILATERAL     CORONARY CALCIUM SCORE-CT ANGIOGRAM  04/2019   Small, < 92m nodule noted.  Difficult study to read.  Coronary calcium score was 11.  Significant motion artifact, but visible territory showed minimal disease.  Minimal calcium noted in the ostial LAD but no suggestion of significant CAD.   EYE SURGERY     FIDUCIAL MARKER PLACEMENT  12/27/2020   Procedure: FIDUCIAL MARKER PLACEMENT;  Surgeon: Collene Gobble, MD;  Location: [facility] ENDOSCOPY;  Service: Pulmonary;;   FIDUCIAL MARKER PLACEMENT  09/29/2022   Procedure: FIDUCIAL MARKER PLACEMENT;  Surgeon: Collene Gobble, MD;  Location: [facility] ENDOSCOPY;  Service: Pulmonary;;   JOINT REPLACEMENT Bilateral    left  knee 2012   KNEE ARTHROPLASTY  02/17/2012   Procedure: COMPUTER ASSISTED TOTAL KNEE ARTHROPLASTY;  Surgeon: Meredith Pel, MD;  Location: [place];  Service: Orthopedics;  Laterality: Right;  Right total knee replacement   MASTECTOMY W/ SENTINEL NODE BIOPSY Left 11/26/2021   Procedure: LEFT BREAST MASTECTOMY;  Surgeon: Stark Klein, MD;  Location: [place];  Service: General;  Laterality: Left;   TRANSTHORACIC ECHOCARDIOGRAM  04/02/2020   Normal LV function.  No R WMA.  Moderate LVH.  GRII DD with elevated filling pressures.  Moderate LA dilation with trivial MR.  Mild aortic valve sclerosis.   VIDEO BRONCHOSCOPY WITH ENDOBRONCHIAL NAVIGATION N/A 12/27/2020   Procedure: VIDEO BRONCHOSCOPY WITH ENDOBRONCHIAL NAVIGATION;  Surgeon: Collene Gobble, MD;  Location: [place] ENDOSCOPY;  Service: Pulmonary;  Laterality: N/A;   VIDEO BRONCHOSCOPY WITH RADIAL ENDOBRONCHIAL ULTRASOUND  09/29/2022   Procedure: VIDEO BRONCHOSCOPY WITH RADIAL ENDOBRONCHIAL ULTRASOUND;  Surgeon: Collene Gobble, MD;  Location: [facility] ENDOSCOPY;  Service: Pulmonary;;    FAMILY HISTORY: family history includes Diabetes in her brother and sister; Hypertension in her sister; Other in her mother.  SOCIAL HISTORY:  reports that she has never smoked. She has never used smokeless tobacco. She reports that she does not drink alcohol and does not use drugs.  ALLERGIES: Amlodipine, Benicar [olmesartan medoxomil], Doxepin hcl, Estradiol, Gabapentin, Levofloxacin, Lisinopril, Nebivolol hcl, Olmesartan, Tramadol hcl, Penicillins, and Sulfa antibiotics  MEDICATIONS:  Current Outpatient Medications  Medication Sig Dispense Refill   albuterol (PROVENTIL HFA;VENTOLIN HFA) 108 (90 Base) MCG/ACT inhaler Inhale 1-2 puffs into the lungs every 6 (six) hours as needed for wheezing or shortness of breath.      amLODipine (NORVASC) 10 MG tablet TAKE ONE TABLET BY MOUTH AT NOON Needs appointment for further refills 90 tablet 1   Artificial Tear Solution (SOOTHE  XP OP) Place 2 drops into both eyes 2 (two) times daily.     Biotin 5000 MCG TABS Take 10,000 mcg by mouth daily at 12 noon.     cetaphil (CETAPHIL) cream Apply 1 application  topically daily as needed (dry skin).     chlorthalidone (HYGROTON) 25 MG tablet TAKE ONE TABLET BY MOUTH AT NOON 90 tablet 1   clindamycin (CLEOCIN) 300 MG capsule Take 600 mg by mouth See admin instructions. Take before dental appointments     fluticasone (FLONASE) 50 MCG/ACT nasal spray Place 2 sprays into both nostrils daily as needed for allergies.     furosemide (LASIX) 20 MG tablet Take 20 mg by mouth daily as needed for fluid or edema.     levothyroxine (SYNTHROID, LEVOTHROID) 88 MCG tablet Take 88 mcg by mouth daily before breakfast.      meclizine (ANTIVERT) 12.5 MG tablet Take 12.5 mg by mouth 3 (three) times daily as needed for dizziness.     Multiple Vitamins-Minerals (PRESERVISION AREDS 2+MULTI VIT) CAPS Take 1 capsule by mouth in the morning and at bedtime.     potassium chloride SA (KLOR-CON) 20 MEQ tablet Take 20 mEq by mouth daily as needed (when taking furosemide).     Probiotic Product (ALIGN) 4 MG CAPS Take 2 capsules by mouth daily. gummy  valsartan (DIOVAN) 320 MG tablet Take 1 tablet (320 mg total) by mouth daily at 12 noon. Will need an appointment to for future refill 30 tablet 0   No current facility-administered medications for this encounter.    REVIEW OF SYSTEMS:  Notable for that above.   PHYSICAL EXAM:  vitals were not taken for this visit.   General: Alert and oriented, in no acute distress *** HEENT: Head is normocephalic. Extraocular movements are intact. Oropharynx is clear. Neck: Neck is supple, no palpable cervical or supraclavicular lymphadenopathy. Heart: Regular in rate and rhythm with no murmurs, rubs, or gallops. Chest: Clear to auscultation bilaterally, with no rhonchi, wheezes, or rales. Abdomen: Soft, nontender, nondistended, with no rigidity or guarding. Extremities:  No cyanosis or edema. Lymphatics: see Neck Exam Skin: No concerning lesions. Musculoskeletal: symmetric strength and muscle tone throughout. Neurologic: Cranial nerves II through XII are grossly intact. No obvious focalities. Speech is fluent. Coordination is intact. Psychiatric: Judgment and insight are intact. Affect is appropriate.   ECOG = ***  0 - Asymptomatic (Fully active, able to carry on all predisease activities without restriction)  1 - Symptomatic but completely ambulatory (Restricted in physically strenuous activity but ambulatory and able to carry out work of a light or sedentary nature. For example, light housework, office work)  2 - Symptomatic, <50% in bed during the day (Ambulatory and capable of all self care but unable to carry out any work activities. Up and about more than 50% of waking hours)  3 - Symptomatic, >50% in bed, but not bedbound (Capable of only limited self-care, confined to bed or chair 50% or more of waking hours)  4 - Bedbound (Completely disabled. Cannot carry on any self-care. Totally confined to bed or chair)  5 - Death   Eustace Pen MM, Creech RH, Tormey DC, et al. [phone]). "Toxicity and response criteria of the [facility] Group". [place] Oncol. 5 (6): 649-55   LABORATORY DATA:  Lab Results  Component Value Date   WBC 8.3 09/01/2022   HGB 10.7 (L) 09/01/2022   HCT 32.1 (L) 09/01/2022   MCV 93.9 09/01/2022   PLT 282 09/01/2022   CMP     Component Value Date/Time   NA 140 09/01/2022 1319   NA 141 01/24/2021 1627   K 3.6 09/01/2022 1319   CL 105 09/01/2022 1319   CO2 27 09/01/2022 1319   GLUCOSE 86 09/01/2022 1319   BUN 21 09/01/2022 1319   BUN 25 01/24/2021 1627   CREATININE 0.88 09/01/2022 1319   CREATININE 0.78 07/21/2019 1525   CALCIUM 9.2 09/01/2022 1319   PROT 7.0 09/01/2022 1319   ALBUMIN 4.5 09/01/2022 1319   AST 16 09/01/2022 1319   AST 15 07/21/2019 1525   ALT 10 09/01/2022 1319   ALT 19 07/21/2019  1525   ALKPHOS 61 09/01/2022 1319   BILITOT 0.3 09/01/2022 1319   BILITOT 0.2 (L) 07/21/2019 1525   GFRNONAA >60 09/01/2022 1319   GFRNONAA >60 07/21/2019 1525   GFRAA 76 12/13/2020 1035   GFRAA >60 07/21/2019 1525         RADIOGRAPHY: DG Chest Port 1 View  Result Date: 09/29/2022 CLINICAL DATA:  Status post bronchoscopy and biopsy EXAM: PORTABLE CHEST 1 VIEW COMPARISON:  PET-CT done on 08/29/2022, CT chest done on 08/09/2022 FINDINGS: Transverse diameter of heart is increased. Increased markings are seen in both lower lung fields, more so on the left side. There are no signs of pulmonary edema. There is  no significant pleural effusion or pneumothorax. There are metallic densities overlying the right upper lung field, possibly fiduciary markers. Surgical clips are seen in left chest wall. Small bilateral cervical ribs are seen. IMPRESSION: Cardiomegaly. Increased markings are seen in both lower lung fields, more so on the left side suggesting subsegmental atelectasis or early pneumonia. Possible minimal left pleural effusion. Electronically Signed   By: Elmer Picker M.D.   On: 09/29/2022 12:00   DG C-ARM BRONCHOSCOPY  Result Date: 09/29/2022 C-ARM BRONCHOSCOPY: Fluoroscopy was utilized by the requesting physician.  No radiographic interpretation.   MM DIAG BREAST TOMO UNI RIGHT  Result Date: 09/16/2022 CLINICAL DATA:  History of LEFT breast cancer status post mastectomy in 2023. Most recent pathology results indicate triple negative tumor. Evaluation of LEFT axilla requested as well as a RIGHT breast mammogram. EXAM: DIGITAL DIAGNOSTIC UNILATERAL RIGHT MAMMOGRAM WITH TOMOSYNTHESIS; [place] AXILLARY LEFT TECHNIQUE: Right digital diagnostic mammography and breast tomosynthesis was performed.; Targeted ultrasound examination of the left axilla was performed. COMPARISON:  Previous exams including most recent bilateral screening mammogram dated 09/11/2021. ACR Breast Density Category b: There  are scattered areas of fibroglandular density. FINDINGS: RIGHT breast diagnostic mammogram: There are no dominant masses, suspicious calcifications or secondary signs of malignancy within the RIGHT breast. Targeted ultrasound is performed, evaluating the LEFT axilla, showing no enlarged or morphologically lymph nodes. Targeted ultrasound is also performed of the upper-outer LEFT chest wall, showing only a thin residual simple-appearing fluid collection underlying the surgical scar, significantly improved compared to earlier chest CT of 01/08/2022. No surgical complicating features are identified today. IMPRESSION: 1. No evidence of malignancy within the RIGHT breast. 2. No evidence of lymphadenopathy within the LEFT axilla. RECOMMENDATION: 1. Screening mammogram for the RIGHT breast in 1 year. 2. Clinical follow-up for patient's previously diagnosed LEFT breast cancer status post mastectomy. I have discussed the findings and recommendations with the patient. If applicable, a reminder letter will be sent to the patient regarding the next appointment. BI-RADS CATEGORY  1: Negative. Electronically Signed   By: Franki Cabot M.D.   On: 09/16/2022 11:19  [place] AXILLA LEFT  Result Date: 09/16/2022 CLINICAL DATA:  History of LEFT breast cancer status post mastectomy in 2023. Most recent pathology results indicate triple negative tumor. Evaluation of LEFT axilla requested as well as a RIGHT breast mammogram. EXAM: DIGITAL DIAGNOSTIC UNILATERAL RIGHT MAMMOGRAM WITH TOMOSYNTHESIS; [place] AXILLARY LEFT TECHNIQUE: Right digital diagnostic mammography and breast tomosynthesis was performed.; Targeted ultrasound examination of the left axilla was performed. COMPARISON:  Previous exams including most recent bilateral screening mammogram dated 09/11/2021. ACR Breast Density Category b: There are scattered areas of fibroglandular density. FINDINGS: RIGHT breast diagnostic mammogram: There are no dominant masses, suspicious  calcifications or secondary signs of malignancy within the RIGHT breast. Targeted ultrasound is performed, evaluating the LEFT axilla, showing no enlarged or morphologically lymph nodes. Targeted ultrasound is also performed of the upper-outer LEFT chest wall, showing only a thin residual simple-appearing fluid collection underlying the surgical scar, significantly improved compared to earlier chest CT of 01/08/2022. No surgical complicating features are identified today. IMPRESSION: 1. No evidence of malignancy within the RIGHT breast. 2. No evidence of lymphadenopathy within the LEFT axilla. RECOMMENDATION: 1. Screening mammogram for the RIGHT breast in 1 year. 2. Clinical follow-up for patient's previously diagnosed LEFT breast cancer status post mastectomy. I have discussed the findings and recommendations with the patient. If applicable, a reminder letter will be sent to the patient regarding the next appointment. BI-RADS CATEGORY  1: Negative. Electronically Signed   By: Franki Cabot M.D.   On: 09/16/2022 11:19     IMPRESSION/PLAN:***    On date of service, in total, I spent *** minutes on this encounter. Patient was seen in person.   __________________________________________   Eppie Gibson, MD  This document serves as a record of services personally performed by Eppie Gibson, MD. It was created on her behalf by Roney Mans, a trained medical scribe. The creation of this record is based on the scribe's personal observations and the provider's statements to them. This document has been checked and approved by the attending provider.

## 2022-10-07 ENCOUNTER — Ambulatory Visit
Admission: RE | Admit: 2022-10-07 | Discharge: 2022-10-07 | Disposition: A | Discharge status: Home / Self Care | Payer: Medicare HMO | Source: Ambulatory Visit | Attending: Radiation Oncology | Admitting: Radiation Oncology

## 2022-10-07 ENCOUNTER — Encounter: Payer: Self-pay | Admitting: Radiation Oncology

## 2022-10-07 ENCOUNTER — Other Ambulatory Visit: Payer: Self-pay

## 2022-10-07 VITALS — BP 139/64 | HR 54 | Temp 98.0°F | Resp 18 | Ht 64.0 in | Wt 197.0 lb

## 2022-10-07 DIAGNOSIS — Z8616 Personal history of COVID-19: Secondary | ICD-10-CM | POA: Insufficient documentation

## 2022-10-07 DIAGNOSIS — C3411 Malignant neoplasm of upper lobe, right bronchus or lung: Secondary | ICD-10-CM | POA: Insufficient documentation

## 2022-10-07 DIAGNOSIS — Z853 Personal history of malignant neoplasm of breast: Secondary | ICD-10-CM | POA: Insufficient documentation

## 2022-10-07 DIAGNOSIS — C342 Malignant neoplasm of middle lobe, bronchus or lung: Secondary | ICD-10-CM | POA: Insufficient documentation

## 2022-10-07 DIAGNOSIS — Z79890 Hormone replacement therapy (postmenopausal): Secondary | ICD-10-CM | POA: Diagnosis not present

## 2022-10-07 DIAGNOSIS — Z923 Personal history of irradiation: Secondary | ICD-10-CM | POA: Diagnosis not present

## 2022-10-07 DIAGNOSIS — E039 Hypothyroidism, unspecified: Secondary | ICD-10-CM | POA: Diagnosis not present

## 2022-10-07 DIAGNOSIS — I509 Heart failure, unspecified: Secondary | ICD-10-CM | POA: Diagnosis not present

## 2022-10-07 DIAGNOSIS — I10 Essential (primary) hypertension: Secondary | ICD-10-CM | POA: Insufficient documentation

## 2022-10-07 DIAGNOSIS — R918 Other nonspecific abnormal finding of lung field: Secondary | ICD-10-CM | POA: Diagnosis not present

## 2022-10-07 DIAGNOSIS — Z79899 Other long term (current) drug therapy: Secondary | ICD-10-CM | POA: Insufficient documentation

## 2022-10-09 ENCOUNTER — Telehealth: Payer: Self-pay | Admitting: *Deleted

## 2022-10-09 DIAGNOSIS — E039 Hypothyroidism, unspecified: Secondary | ICD-10-CM | POA: Diagnosis not present

## 2022-10-09 DIAGNOSIS — M1990 Unspecified osteoarthritis, unspecified site: Secondary | ICD-10-CM | POA: Diagnosis not present

## 2022-10-09 DIAGNOSIS — E1169 Type 2 diabetes mellitus with other specified complication: Secondary | ICD-10-CM | POA: Diagnosis not present

## 2022-10-09 DIAGNOSIS — I10 Essential (primary) hypertension: Secondary | ICD-10-CM | POA: Diagnosis not present

## 2022-10-09 DIAGNOSIS — J45909 Unspecified asthma, uncomplicated: Secondary | ICD-10-CM | POA: Diagnosis not present

## 2022-10-09 NOTE — Telephone Encounter (Signed)
RECEIVED A PHONE CALL FROM RESPIRATORY @ [place] AND PATIENT WILL HAVE PFT ON 10-10-22 @ [place] AND PATIENT IS AWARE OF THIS TEST

## 2022-10-10 ENCOUNTER — Other Ambulatory Visit: Payer: Self-pay

## 2022-10-10 ENCOUNTER — Ambulatory Visit (HOSPITAL_COMMUNITY)
Admission: RE | Admit: 2022-10-10 | Discharge: 2022-10-10 | Disposition: A | Discharge status: Home / Self Care | Payer: Medicare HMO | Source: Ambulatory Visit | Attending: Radiology | Admitting: Radiology

## 2022-10-10 DIAGNOSIS — C3411 Malignant neoplasm of upper lobe, right bronchus or lung: Secondary | ICD-10-CM | POA: Diagnosis not present

## 2022-10-10 DIAGNOSIS — C342 Malignant neoplasm of middle lobe, bronchus or lung: Secondary | ICD-10-CM | POA: Diagnosis not present

## 2022-10-10 LAB — PULMONARY FUNCTION TEST
DL/VA % pred: 99 %
DL/VA: 4 ml/min/mmHg/L
DLCO unc % pred: 64 %
DLCO unc: 12.17 ml/min/mmHg
FEF 25-75 Pre: 1.43 L/sec
FEF2575-%Pred-Pre: 121 %
FEV1-%Pred-Pre: 79 %
FEV1-Pre: 1.44 L
FEV1FVC-%Pred-Pre: 112 %
FEV6-%Pred-Pre: 76 %
FEV6-Pre: 1.75 L
FEV6FVC-%Pred-Pre: 105 %
FVC-%Pred-Pre: 71 %
FVC-Pre: 1.76 L
Pre FEV1/FVC ratio: 82 %
Pre FEV6/FVC Ratio: 99 %
RV % pred: 324 %
RV: 8.16 L
TLC % pred: 195 %
TLC: 10.01 L

## 2022-10-10 MED ORDER — ALBUTEROL SULFATE (2.5 MG/3ML) 0.083% IN NEBU: 2.5000 mg | INHALATION_SOLUTION | Freq: Once | RESPIRATORY_TRACT | Status: DC

## 2022-10-10 NOTE — Progress Notes (Signed)
The proposed treatment discussed in conference is for discussion purpose only and is not a binding recommendation.  The patients have not been physically examined, or presented with their treatment options.  Therefore, final treatment plans cannot be decided.  

## 2022-10-14 DIAGNOSIS — C342 Malignant neoplasm of middle lobe, bronchus or lung: Secondary | ICD-10-CM | POA: Diagnosis not present

## 2022-10-14 DIAGNOSIS — C3411 Malignant neoplasm of upper lobe, right bronchus or lung: Secondary | ICD-10-CM | POA: Diagnosis not present

## 2022-10-21 ENCOUNTER — Ambulatory Visit
Admission: RE | Admit: 2022-10-21 | Discharge: 2022-10-21 | Disposition: A | Discharge status: Home / Self Care | Payer: Medicare HMO | Source: Ambulatory Visit | Attending: Radiation Oncology | Admitting: Radiation Oncology

## 2022-10-21 ENCOUNTER — Other Ambulatory Visit: Payer: Self-pay

## 2022-10-21 DIAGNOSIS — C342 Malignant neoplasm of middle lobe, bronchus or lung: Secondary | ICD-10-CM | POA: Diagnosis not present

## 2022-10-21 LAB — RAD ONC ARIA SESSION SUMMARY
Course Elapsed Days: 0
Plan Fractions Treated to Date: 1
Plan Fractions Treated to Date: 1
Plan Prescribed Dose Per Fraction: 11 Gy
Plan Prescribed Dose Per Fraction: 11 Gy
Plan Total Fractions Prescribed: 5
Plan Total Fractions Prescribed: 5
Plan Total Prescribed Dose: 55 Gy
Plan Total Prescribed Dose: 55 Gy
Reference Point Dosage Given to Date: 11 Gy
Reference Point Dosage Given to Date: 11 Gy
Reference Point Session Dosage Given: 11 Gy
Reference Point Session Dosage Given: 11 Gy
Session Number: 1

## 2022-10-21 MED ORDER — SONAFINE EX EMUL
1.0000 | Freq: Once | CUTANEOUS | Status: AC
  Administered 2022-10-21: 1 via TOPICAL

## 2022-10-22 ENCOUNTER — Ambulatory Visit: Payer: Medicare HMO | Admitting: Radiation Oncology

## 2022-10-23 ENCOUNTER — Other Ambulatory Visit: Payer: Self-pay

## 2022-10-23 ENCOUNTER — Ambulatory Visit
Admission: RE | Admit: 2022-10-23 | Discharge: 2022-10-23 | Disposition: A | Discharge status: Home / Self Care | Payer: Medicare HMO | Source: Ambulatory Visit | Attending: Radiation Oncology | Admitting: Radiation Oncology

## 2022-10-23 DIAGNOSIS — C342 Malignant neoplasm of middle lobe, bronchus or lung: Secondary | ICD-10-CM | POA: Diagnosis not present

## 2022-10-23 LAB — RAD ONC ARIA SESSION SUMMARY
Course Elapsed Days: 2
Plan Fractions Treated to Date: 2
Plan Fractions Treated to Date: 2
Plan Prescribed Dose Per Fraction: 11 Gy
Plan Prescribed Dose Per Fraction: 11 Gy
Plan Total Fractions Prescribed: 5
Plan Total Fractions Prescribed: 5
Plan Total Prescribed Dose: 55 Gy
Plan Total Prescribed Dose: 55 Gy
Reference Point Dosage Given to Date: 22 Gy
Reference Point Dosage Given to Date: 22 Gy
Reference Point Session Dosage Given: 11 Gy
Reference Point Session Dosage Given: 11 Gy
Session Number: 2

## 2022-10-24 ENCOUNTER — Ambulatory Visit: Payer: Medicare HMO | Admitting: Radiation Oncology

## 2022-10-27 ENCOUNTER — Other Ambulatory Visit: Payer: Self-pay

## 2022-10-27 ENCOUNTER — Ambulatory Visit
Admission: RE | Admit: 2022-10-27 | Discharge: 2022-10-27 | Disposition: A | Discharge status: Home / Self Care | Payer: Medicare HMO | Source: Ambulatory Visit | Attending: Radiation Oncology | Admitting: Radiation Oncology

## 2022-10-27 DIAGNOSIS — C342 Malignant neoplasm of middle lobe, bronchus or lung: Secondary | ICD-10-CM

## 2022-10-27 LAB — RAD ONC ARIA SESSION SUMMARY
Course Elapsed Days: 6
Plan Fractions Treated to Date: 3
Plan Fractions Treated to Date: 3
Plan Prescribed Dose Per Fraction: 11 Gy
Plan Prescribed Dose Per Fraction: 11 Gy
Plan Total Fractions Prescribed: 5
Plan Total Fractions Prescribed: 5
Plan Total Prescribed Dose: 55 Gy
Plan Total Prescribed Dose: 55 Gy
Reference Point Dosage Given to Date: 33 Gy
Reference Point Dosage Given to Date: 33 Gy
Reference Point Session Dosage Given: 11 Gy
Reference Point Session Dosage Given: 11 Gy
Session Number: 3

## 2022-10-29 ENCOUNTER — Ambulatory Visit
Admission: RE | Admit: 2022-10-29 | Discharge: 2022-10-29 | Disposition: A | Discharge status: Home / Self Care | Payer: Medicare HMO | Source: Ambulatory Visit | Attending: Radiation Oncology | Admitting: Radiation Oncology

## 2022-10-29 ENCOUNTER — Other Ambulatory Visit: Payer: Self-pay

## 2022-10-29 DIAGNOSIS — C342 Malignant neoplasm of middle lobe, bronchus or lung: Secondary | ICD-10-CM | POA: Diagnosis not present

## 2022-10-29 LAB — RAD ONC ARIA SESSION SUMMARY
Course Elapsed Days: 8
Plan Fractions Treated to Date: 4
Plan Fractions Treated to Date: 4
Plan Prescribed Dose Per Fraction: 11 Gy
Plan Prescribed Dose Per Fraction: 11 Gy
Plan Total Fractions Prescribed: 5
Plan Total Fractions Prescribed: 5
Plan Total Prescribed Dose: 55 Gy
Plan Total Prescribed Dose: 55 Gy
Reference Point Dosage Given to Date: 44 Gy
Reference Point Dosage Given to Date: 44 Gy
Reference Point Session Dosage Given: 11 Gy
Reference Point Session Dosage Given: 11 Gy
Session Number: 4

## 2022-10-31 ENCOUNTER — Other Ambulatory Visit: Payer: Self-pay

## 2022-10-31 ENCOUNTER — Ambulatory Visit
Admission: RE | Admit: 2022-10-31 | Discharge: 2022-10-31 | Disposition: A | Discharge status: Home / Self Care | Payer: Medicare HMO | Source: Ambulatory Visit | Attending: Radiation Oncology | Admitting: Radiation Oncology

## 2022-10-31 DIAGNOSIS — C342 Malignant neoplasm of middle lobe, bronchus or lung: Secondary | ICD-10-CM | POA: Diagnosis not present

## 2022-10-31 DIAGNOSIS — C3411 Malignant neoplasm of upper lobe, right bronchus or lung: Secondary | ICD-10-CM | POA: Diagnosis not present

## 2022-10-31 DIAGNOSIS — Z510 Encounter for antineoplastic radiation therapy: Secondary | ICD-10-CM | POA: Diagnosis not present

## 2022-10-31 LAB — RAD ONC ARIA SESSION SUMMARY
Course Elapsed Days: 10
Plan Fractions Treated to Date: 5
Plan Fractions Treated to Date: 5
Plan Prescribed Dose Per Fraction: 11 Gy
Plan Prescribed Dose Per Fraction: 11 Gy
Plan Total Fractions Prescribed: 5
Plan Total Fractions Prescribed: 5
Plan Total Prescribed Dose: 55 Gy
Plan Total Prescribed Dose: 55 Gy
Reference Point Dosage Given to Date: 55 Gy
Reference Point Dosage Given to Date: 55 Gy
Reference Point Session Dosage Given: 11 Gy
Reference Point Session Dosage Given: 11 Gy
Session Number: 5

## 2022-11-05 ENCOUNTER — Encounter: Payer: Self-pay | Admitting: Nurse Practitioner

## 2022-11-05 ENCOUNTER — Ambulatory Visit: Payer: Medicare HMO | Attending: Nurse Practitioner | Admitting: Nurse Practitioner

## 2022-11-05 VITALS — BP 134/56 | HR 53 | Ht 64.5 in | Wt 199.2 lb

## 2022-11-05 DIAGNOSIS — R5383 Other fatigue: Secondary | ICD-10-CM

## 2022-11-05 DIAGNOSIS — I10 Essential (primary) hypertension: Secondary | ICD-10-CM

## 2022-11-05 DIAGNOSIS — R001 Bradycardia, unspecified: Secondary | ICD-10-CM | POA: Diagnosis not present

## 2022-11-05 DIAGNOSIS — I5032 Chronic diastolic (congestive) heart failure: Secondary | ICD-10-CM

## 2022-11-05 NOTE — Patient Instructions (Signed)
Medication Instructions:  Your physician recommends that you continue on your current medications as directed. Please refer to the Current Medication list given to you today.   *If you need a refill on your cardiac medications before your next appointment, please call your pharmacy*   Lab Work: NONE ordered at this time of appointment   If you have labs (blood work) drawn today and your tests are completely normal, you will receive your results only by: MyChart Message (if you have MyChart) OR A paper copy in the mail If you have any lab test that is abnormal or we need to change your treatment, we will call you to review the results.   Testing/Procedures: NONE ordered at this time of appointment     Follow-Up: At [facility], you and your health needs are our priority.  As part of our continuing mission to provide you with exceptional heart care, we have created designated Provider Care Teams.  These Care Teams include your primary Cardiologist (physician) and Advanced Practice Providers (APPs -  Physician Assistants and Nurse Practitioners) who all work together to provide you with the care you need, when you need it.  We recommend signing up for the patient portal called "MyChart".  Sign up information is provided on this After Visit Summary.  MyChart is used to connect with patients for Virtual Visits (Telemedicine).  Patients are able to view lab/test results, encounter notes, upcoming appointments, etc.  Non-urgent messages can be sent to your provider as well.   To learn more about what you can do with MyChart, go to [email or web address].    Your next appointment:   6 month(s)  Provider:   Bryan Lemma, MD     Other Instructions

## 2022-11-05 NOTE — Progress Notes (Signed)
Office Visit    Patient Name: Alisha [name] Date of Encounter: 11/05/2022  Primary Care Provider:  [name], MD Primary Cardiologist:  Glenetta Hew, MD  Chief Complaint    88 year old female with a history of chronic diastolic heart failure, bradycardia, hypertension, aortic atherosclerosis, hypothyroidism, breast cancer, lung cancer who presents for follow-up related to heart failure and hypertension.  Past Medical History    Past Medical History:  Diagnosis Date   Arthritis    Asthma    Never smoker   Blood transfusion    over 104 y ears   Cancer [facility])    CHF (congestive heart failure) ([place])    diastolic   COVID 7673   n/v/diarrhea   Dysrhythmia    Bradycardia per Dr. Allison Quarry note on 04/2022   History of radiation therapy 09/07/19- 10/06/19   Left Breast 15 fractions of 2.67 Gy to total 40.05 Gy. Left breast boost 5 fractions of 2 Gy each to total 10 Gy   Hypertension    on meds   Hypothyroidism    on meds   Malignant neoplasm of left breast metastatic to lung ([place]) 06/22/2019   a) 9/'20 ->  Upper-Outer Quadrant of Left Breast: Clinical stage from 06/22/2019: Stage IB (cT1b, cN0, cM0, G3, ER-, PR-, HER2-) => L Lumpectomy & XRT - (f/u Path 11/21: Stage 1B (pT1b, pN0,cM0; 1/'23: Recurrent Upper-Inner Quadrant ( Clinical Stage cT1, cN0, cM0) -mastectomy; now treated with XRT.   Malignant neoplasm of right upper lobe of lung ([place]) 01/23/2021   Stage IB (cT1c, cN0, cM0, G2, ER-, PR-, HER2-   Personal history of radiation therapy    Pre-diabetes    Past Surgical History:  Procedure Laterality Date   ABDOMINAL HYSTERECTOMY     32 yrs   AXILLARY SENTINEL NODE BIOPSY Left 11/26/2021   Procedure: LEFT AXILLARY SENTINEL NODE BIOPSY;  Surgeon: Stark Klein, MD;  Location: [place];  Service: General;  Laterality: Left;   BREAST LUMPECTOMY Left 08/11/2016   BREAST LUMPECTOMY Left 07/27/2019   BREAST LUMPECTOMY WITH RADIOACTIVE SEED AND SENTINEL LYMPH NODE BIOPSY Left  07/27/2019   Procedure: LEFT BREAST LUMPECTOMY WITH RADIOACTIVE SEED AND LEFT SENTINEL LYMPH NODE MAPPING;  Surgeon: Erroll Luna, MD;  Location: [place];  Service: General;  Laterality: Left;   BREAST LUMPECTOMY WITH RADIOACTIVE SEED LOCALIZATION Left 08/11/2016   Procedure: LEFT BREAST LUMPECTOMY WITH RADIOACTIVE SEED LOCALIZATION;  Surgeon: Erroll Luna, MD;  Location: [place];  Service: General;  Laterality: Left;   BRONCHIAL BIOPSY  12/27/2020   Procedure: BRONCHIAL BIOPSIES;  Surgeon: Collene Gobble, MD;  Location: [place];  Service: Pulmonary;;   BRONCHIAL BIOPSY  09/29/2022   Procedure: BRONCHIAL BIOPSIES;  Surgeon: Collene Gobble, MD;  Location: [facility] ENDOSCOPY;  Service: Pulmonary;;   BRONCHIAL BRUSHINGS  12/27/2020   Procedure: BRONCHIAL BRUSHINGS;  Surgeon: Collene Gobble, MD;  Location: [facility] ENDOSCOPY;  Service: Pulmonary;;   BRONCHIAL BRUSHINGS  09/29/2022   Procedure: BRONCHIAL BRUSHINGS;  Surgeon: Collene Gobble, MD;  [facility];  Service: Pulmonary;;   BRONCHIAL NEEDLE ASPIRATION BIOPSY  12/27/2020   Procedure: BRONCHIAL NEEDLE ASPIRATION BIOPSIES;  Surgeon: Collene Gobble, MD;  Location: [facility] ENDOSCOPY;  Service: Pulmonary;;   BRONCHIAL NEEDLE ASPIRATION BIOPSY  09/29/2022   Procedure: BRONCHIAL NEEDLE ASPIRATION BIOPSIES;  Surgeon: Collene Gobble, MD;  Location: [place];  Service: Pulmonary;;   BRONCHIAL WASHINGS  12/27/2020   Procedure: BRONCHIAL WASHINGS;  Surgeon: Collene Gobble, MD;  Location:  [facility] ENDOSCOPY;  Service: Pulmonary;;   BRONCHIAL WASHINGS  09/29/2022   Procedure: BRONCHIAL WASHINGS;  Surgeon: Leslye Peer, MD;  Location: [facility] ENDOSCOPY;  Service: Pulmonary;;   CATARACT EXTRACTION, BILATERAL     CORONARY CALCIUM SCORE-CT ANGIOGRAM  04/2019   Small, < 79mm nodule noted.  Difficult study to read.  Coronary calcium score was 11.  Significant motion artifact, but visible territory showed minimal disease.  Minimal calcium  noted in the ostial LAD but no suggestion of significant CAD.   EYE SURGERY     FIDUCIAL MARKER PLACEMENT  12/27/2020   Procedure: FIDUCIAL MARKER PLACEMENT;  Surgeon: Leslye Peer, MD;  Location: [facility] ENDOSCOPY;  Service: Pulmonary;;   FIDUCIAL MARKER PLACEMENT  09/29/2022   Procedure: FIDUCIAL MARKER PLACEMENT;  Surgeon: Leslye Peer, MD;  Location: [facility] ENDOSCOPY;  Service: Pulmonary;;   JOINT REPLACEMENT Bilateral    left knee 2012   KNEE ARTHROPLASTY  02/17/2012   Procedure: COMPUTER ASSISTED TOTAL KNEE ARTHROPLASTY;  Surgeon: Cammy Copa, MD;  Location: [facility] OR;  Service: Orthopedics;  Laterality: Right;  Right total knee replacement   MASTECTOMY W/ SENTINEL NODE BIOPSY Left 11/26/2021   Procedure: LEFT BREAST MASTECTOMY;  Surgeon: Almond Lint, MD;  Location: [facility] OR;  Service: General;  Laterality: Left;   TRANSTHORACIC ECHOCARDIOGRAM  04/02/2020   Normal LV function.  No R WMA.  Moderate LVH.  GRII DD with elevated filling pressures.  Moderate LA dilation with trivial MR.  Mild aortic valve sclerosis.   VIDEO BRONCHOSCOPY WITH ENDOBRONCHIAL NAVIGATION N/A 12/27/2020   Procedure: VIDEO BRONCHOSCOPY WITH ENDOBRONCHIAL NAVIGATION;  Surgeon: Leslye Peer, MD;  Location[facility] ENDOSCOPY;  Service: Pulmonary;  Laterality: N/A;   VIDEO BRONCHOSCOPY WITH RADIAL ENDOBRONCHIAL ULTRASOUND  09/29/2022   Procedure: VIDEO BRONCHOSCOPY WITH RADIAL ENDOBRONCHIAL ULTRASOUND;  Surgeon: Leslye Peer, MD;  Location: [facility] ENDOSCOPY;  Service: Pulmonary;;    Allergies  Allergies  Allergen Reactions   Amlodipine Other (See Comments)    Other reaction(s): swelling, ha, itching   Benicar [Olmesartan Medoxomil] Other (See Comments)    Unknown   Doxepin Hcl     Other reaction(s): Body swelling   Estradiol     Other reaction(s): itching   Gabapentin     Other reaction(s): swelling   Levofloxacin     Other reaction(s): Unknown   Lisinopril Itching    No energy Other reaction(s): itching, hair  loss   Nebivolol Hcl     Other reaction(s): fatigue   Olmesartan     Other reaction(s): hair loss, nail changes   Tramadol Hcl     Other reaction(s): abdominal pain   Penicillins Itching and Rash    Has patient had a PCN reaction causing immediate rash, facial/tongue/throat swelling, SOB or lightheadedness with hypotension: No Has patient had a PCN reaction causing severe rash involving mucus membranes or skin necrosis: No Has patient had a PCN reaction that required hospitalization: No Has patient had a PCN reaction occurring within the last 10 years: No If all of the above answers are "NO", then may proceed with Cephalosporin use.   Sulfa Antibiotics Itching and Rash    Other reaction(s): Unknown     Labs/Other Studies Reviewed    The following studies were reviewed today: Echo 04/02/2020: IMPRESSIONS     1. Left ventricular ejection fraction, by estimation, is 60 to 65%. The  left ventricle has normal function. The left ventricle has no regional  wall motion abnormalities. There is moderate asymmetric left ventricular  hypertrophy. Left ventricular  diastolic parameters are consistent with Grade II diastolic dysfunction  (pseudonormalization). Elevated left ventricular end-diastolic pressure.   2. Right ventricular systolic function is normal. The right ventricular  size is normal. There is normal pulmonary artery systolic pressure. The  estimated right ventricular systolic pressure is 16.1 mmHg.   3. Left atrial size was moderately dilated.   4. The mitral valve is normal in structure. Trivial mitral valve  regurgitation. No evidence of mitral stenosis.   5. The aortic valve is tricuspid. Aortic valve regurgitation is trivial.  Mild aortic valve sclerosis is present, with no evidence of aortic valve  stenosis.   6. The inferior vena cava is normal in size with greater than 50%  respiratory variability, suggesting right atrial pressure of 3 mmHg.   Coronary CTA  05/12/2019: IMPRESSION: 1.  Calcium score only 11 which is 28 th percentile for age and sex   2.  Normal aortic root 3.1 cm   3. Non diagnostic study do to severe motion artifact. Portion of the ostial RCA and distal LAD not well visualized. However study would be considered low risk due to minimum calcium (only on non obstructive foci in ostial LAD. And no disease in remainder of visualized segments  Cardiac monitor 05/04/2019: Predominant rhythm is sinus with a minimum heart rate of 36 bpm, maximum heart rate 88 bpm. 7 runs of PAT/PSVT noted 6-8 beats. Fastest heart rate 162 bpm. Rare PACs with minimal PVCs noted. Patient noted lightheadedness with rates ranging from 49 to 61 bpm in sinus rhythm/sinus bradycardia. Slowest heart rate noted 36 bpm at 5:40 PM. 38 bpm at 11:36 AM   Based on these findings, would appear that the slowest heart rate episodes were all noted while she was still taking diltiazem according to plan.  Further bradycardia episodes were not recorded once she was no longer taking diltiazem   Although there were SVT/PAT runs, would recommend continue to hold AV nodal agents to avoid worsening bradycardia.   Recent Labs: 11/27/2021: Magnesium 2.0 04/29/2022: TSH 2.260 09/01/2022: ALT 10; BUN 21; Creatinine, Ser 0.88; Hemoglobin 10.7; Platelets 282; Potassium 3.6; Sodium 140  Recent Lipid Panel No results found for: "CHOL", "TRIG", "HDL", "CHOLHDL", "VLDL", "LDLCALC", "LDLDIRECT"  History of Present Illness    88 year old female with the above past medical history including chronic diastolic heart failure, bradycardia, hypertension, aortic atherosclerosis, hypothyroidism, breast cancer, and lung cancer.   Coronary CTA in 2020 showed calcium score of 11 (28th percentile).  Echocardiogram in 03/2020 showed EF 60 to 65%, normal LV function, no RWMA, G2 DD, elevated LVEDP, normal LV systolic function, no significant valvular abnormalities. She was last seen in the office on  05/19/2022 and was stable from a cardiac standpoint. She did note ongoing fatigue that was thought to be multifactorial in the setting of physical deconditioning, obesity, bradycardia, vitamin D deficiency, and breast cancer.  It was noted that should she have ongoing fatigue she may benefit from possible sleep study as she did have some symptoms consistent with encephalopathy.  She presents today for follow-up.  Since her last visit she has done well from a cardiac standpoint.  She notes that she was prescribed vitamin D by her oncologist and t she has noted significant improvement in her energy levels.  She denies chest pain, dyspnea, edema, PND, orthopnea, weight gain.  BP has been well-controlled.  HR remains borderline low.  She denies any palpitations, dizziness, presyncope, syncope.  She just finished her last radiation treatment  last Friday.  Overall, she reports feeling well.  Home Medications    Current Outpatient Medications  Medication Sig Dispense Refill   albuterol (PROVENTIL HFA;VENTOLIN HFA) 108 (90 Base) MCG/ACT inhaler Inhale 1-2 puffs into the lungs every 6 (six) hours as needed for wheezing or shortness of breath.      amLODipine (NORVASC) 10 MG tablet TAKE ONE TABLET BY MOUTH AT NOON Needs appointment for further refills 90 tablet 1   Artificial Tear Solution (SOOTHE XP OP) Place 2 drops into both eyes 2 (two) times daily.     Biotin 5000 MCG TABS Take 10,000 mcg by mouth daily at 12 noon.     cetaphil (CETAPHIL) cream Apply 1 application  topically daily as needed (dry skin).     chlorthalidone (HYGROTON) 25 MG tablet TAKE ONE TABLET BY MOUTH AT NOON 90 tablet 1   clindamycin (CLEOCIN) 300 MG capsule Take 600 mg by mouth See admin instructions. Take before dental appointments     fluticasone (FLONASE) 50 MCG/ACT nasal spray Place 2 sprays into both nostrils daily as needed for allergies.     furosemide (LASIX) 20 MG tablet Take 20 mg by mouth daily as needed for fluid or edema.      levothyroxine (SYNTHROID, LEVOTHROID) 88 MCG tablet Take 88 mcg by mouth daily before breakfast.      meclizine (ANTIVERT) 12.5 MG tablet Take 12.5 mg by mouth 3 (three) times daily as needed for dizziness.     Multiple Vitamins-Minerals (PRESERVISION AREDS 2+MULTI VIT) CAPS Take 1 capsule by mouth in the morning and at bedtime.     potassium chloride SA (KLOR-CON) 20 MEQ tablet Take 20 mEq by mouth daily as needed (when taking furosemide).     Probiotic Product (ALIGN) 4 MG CAPS Take 2 capsules by mouth daily. gummy     valsartan (DIOVAN) 320 MG tablet Take 1 tablet (320 mg total) by mouth daily at 12 noon. Will need an appointment to for future refill 30 tablet 0   No current facility-administered medications for this visit.     Review of Systems    She denies chest pain, palpitations, dyspnea, pnd, orthopnea, n, v, dizziness, syncope, edema, weight gain, or early satiety. All other systems reviewed and are otherwise negative except as noted above.   Physical Exam    VS:  BP (!) 134/56 (BP Location: Left Arm, Patient Position: Sitting, Cuff Size: Large)   Pulse (!) 53   Ht 5' 4.5" (1.638 m)   Wt 199 lb 3.2 oz (90.4 kg)   SpO2 99%   BMI 33.66 kg/m   GEN: Well nourished, well developed, in no acute distress. HEENT: normal. Neck: Supple, no JVD, carotid bruits, or masses. Cardiac: RRR, no murmurs, rubs, or gallops. No clubbing, cyanosis, edema.  Radials/DP/PT 2+ and equal bilaterally.  Respiratory:  Respirations regular and unlabored, clear to auscultation bilaterally. GI: Soft, nontender, nondistended, BS + x 4. MS: no deformity or atrophy. Skin: warm and dry, no rash. Neuro:  Strength and sensation are intact. Psych: Normal affect.  Accessory Clinical Findings    ECG personally reviewed by me today - Sinus bradycardia, 53 bpm, LBBB - no acute changes.   Lab Results  Component Value Date   WBC 8.3 09/01/2022   HGB 10.7 (L) 09/01/2022   HCT 32.1 (L) 09/01/2022   MCV 93.9  09/01/2022   PLT 282 09/01/2022   Lab Results  Component Value Date   CREATININE 0.88 09/01/2022   BUN 21 09/01/2022  NA 140 09/01/2022   K 3.6 09/01/2022   CL 105 09/01/2022   CO2 27 09/01/2022   Lab Results  Component Value Date   ALT 10 09/01/2022   AST 16 09/01/2022   ALKPHOS 61 09/01/2022   BILITOT 0.3 09/01/2022   No results found for: "CHOL", "HDL", "LDLCALC", "LDLDIRECT", "TRIG", "CHOLHDL"  Lab Results  Component Value Date   HGBA1C 5.3 11/22/2021    Assessment & Plan    1. Chronic diastolic heart failure: Echo in 03/2020 showed EF 60 to 65%, normal LV function, no RWMA, G2 DD, elevated LVEDP, normal RV systolic function, no significant valvular abnormalities. Euvolemic and well compensated on exam.  Continue valsartan, chlorthalidone, Lasix as needed.  2. Hypertension: BP well controlled. Continue current antihypertensive regimen.   3. Bradycardia/LBBB: EKG today shows sinus bradycardia, 53 bpm, LBBB.  LBBB appears to be new.  She denies any symptoms concerning for angina.  She is not on any nodal blocking agents. Continue to monitor.  4. Fatigue: Improved with vitamin D supplementation.  5. Disposition: Follow-up in 6 months.     Joylene Grapes, NP 11/05/2022, 11:32 AM

## 2022-11-06 NOTE — Radiation Completion Notes (Signed)
Patient Name: Alisha [name], Alisha [name] MRN: [id number] Date of Birth: 1935/02/19 Referring Physician: Farris Has, M.D. Date of Service: 2022-11-06 Radiation Oncologist: Lonie Peak, M.D. [facility]                             Radiation Oncology End of Treatment Note     Diagnosis: C34.11 Malignant neoplasm of upper lobe, right bronchus or lung; C34.2 Malignant neoplasm of middle lobe, bronchus or lung Staging on 2020-08-26: Malignant neoplasm of upper-outer quadrant of left breast in female, estrogen receptor negative (HCC) T=pT1b, N=pN0, M=cM0 Staging on 2019-06-22: Malignant neoplasm of upper-outer quadrant of left breast in female, estrogen receptor negative (HCC) T=cT1b, N=cN0, M=cM0 Staging on 2021-01-23: Malignant neoplasm of upper lobe of right lung (HCC) T=cT1, N=cN0, M=cM0 Staging on 2021-10-29: Malignant neoplasm of upper-inner quadrant of left breast in female, estrogen receptor negative (HCC) T=cT1c, N=cN0, M=cM0 Staging on 2022-10-07: Cancer of middle lobe of lung (HCC) T=cT1a, N=cN0, M=cM0 Intent: Curative     ==========DELIVERED PLANS==========  First Treatment Date: 2022-10-21 - Last Treatment Date: 2022-10-31   Plan Name: Lung_RUL_SBRT Site: Lung, Right Technique: SBRT/SRT-IMRT Mode: Photon Dose Per Fraction: 11 Gy Prescribed Dose (Delivered / Prescribed): 55 Gy / 55 Gy Prescribed Fxs (Delivered / Prescribed): 5 / 5   Plan Name: Lung_RML_SBRT Site: Lung, Right Technique: SBRT/SRT-IMRT Mode: Photon Dose Per Fraction: 11 Gy Prescribed Dose (Delivered / Prescribed): 55 Gy / 55 Gy Prescribed Fxs (Delivered / Prescribed): 5 / 5     ==========ON TREATMENT VISIT DATES========== 2022-10-21, 2022-10-21, 2022-10-21, 2022-10-23, 2022-10-23, 2022-10-27, 2022-10-27, 2022-10-27, 2022-10-29, 2022-10-29, 2022-10-31, 2022-10-31     ==========UPCOMING VISITS==========       ==========APPENDIX - ON TREATMENT VISIT NOTES==========    PatEd 2019-09-12 Ongoing education performed. completed 09/12/19   ImpPlan 2019-09-12 The patient is tolerating radiation. Continue treatment as planned.   PhysExam 2019-09-12 Alert, no acute distress.   ProgNote 2019-09-12 Alisha [name] presents after her 4th fraction of radiation to her left breast. She denies concerns. She was provided education today and will begin using sonafine as directed.    PatEd 2019-09-19 Ongoing education performed.   ImpPlan 2019-09-19 The patient is tolerating radiation. Continue treatment as planned.   PhysExam 2019-09-19 Alert, no acute distress.   ProgNote 2019-09-19 Alisha [name] presents after her 7th fraction of radiation to her left breast. She denies concerns and is feeling well. She is using sonafine as directed to her radiation site.    PatEd 2019-09-26 Ongoing education performed.   ImpPlan 2019-09-26 The patient is tolerating radiation. Continue treatment as planned.   PhysExam 2019-09-26 Alert, no acute distress.   ProgNote 2019-09-26 Patient seen on LINAC by Dr. Basilio Cairo   PatEd 2019-10-03 Ongoing education performed.   ImpPlan 2019-10-03 The patient is tolerating radiation. Continue treatment as planned.   PhysExam 2019-10-03 Alert, no acute distress.   ProgNote 2019-10-03 Alisha [name] presents after her 17th fraction of radiation to her left breast. She is feeling well. There is hyperpimentation present to her left breast radiation site. She is using sonafine as directed. She was given a one month follow up apt today.    PatEd 2021-02-20 Ongoing education performed.   ImpPlan 2021-02-20 The patient is tolerating radiation. Continue treatment as planned.   PhysExam 2021-02-20 Alert, no acute distress.   ProgNote 2021-02-20 Patient presents today after final SBRT treatment to her right lung. Reports intermittent fatigue, but states it's tolerable. Denies any new or worsening respiratory  concerns. Denies  any persistent cough (dry or productive). Denies any difficulty swallowing or a decrease in her appetite. Overall she reports she feels well and tolerated treatment without any difficulty. 1 month F/U card provided   PatEd 2022-10-21 Ongoing education performed.   ImpPlan 2022-10-21 The patient is tolerating radiation. Continue treatment as planned.   PhysExam 2022-10-21 Alert, no acute distress.   ProgNote 2022-10-21 Changes from last week/visit? [ No ] Pain? [ No ] Fatigue? [ Yes- Mild ] Skin irritation? [ No ] Shortness of breath? Cough? [ No ] Pain or diff. swallowing? [ No ] Getting chemo/immunotherapy? Last tx: [ No ] Need refills: [ No ] Additional  Weekly Progress Notes [  ]    RunningNotes 2022-10-21 10-21-22 education completed   PatEd 2022-10-27 Ongoing education performed.   ImpPlan 2022-10-27 The patient is tolerating radiation. Continue treatment as planned.   PhysExam 2022-10-27 Alert, no acute distress.   ProgNote 2022-10-27 Changes from last week/visit? [ No ] Pain? [ No ] Fatigue? [ Yes, mild ] Skin irritation? [ No ] Shortness of breath? Cough? [ No ] Pain or diff. swallowing? [ No ] Getting chemo/immunotherapy? Last tx: [ No ] Need refills: [ No ] Additional  Weekly Progress Notes [ no major concerns this week.  ]

## 2022-11-07 DIAGNOSIS — Z853 Personal history of malignant neoplasm of breast: Secondary | ICD-10-CM | POA: Diagnosis not present

## 2022-11-07 DIAGNOSIS — I700 Atherosclerosis of aorta: Secondary | ICD-10-CM | POA: Diagnosis not present

## 2022-11-07 DIAGNOSIS — E039 Hypothyroidism, unspecified: Secondary | ICD-10-CM | POA: Diagnosis not present

## 2022-11-07 DIAGNOSIS — E1169 Type 2 diabetes mellitus with other specified complication: Secondary | ICD-10-CM | POA: Diagnosis not present

## 2022-11-07 DIAGNOSIS — I10 Essential (primary) hypertension: Secondary | ICD-10-CM | POA: Diagnosis not present

## 2022-11-07 DIAGNOSIS — R609 Edema, unspecified: Secondary | ICD-10-CM | POA: Diagnosis not present

## 2022-11-07 DIAGNOSIS — C3491 Malignant neoplasm of unspecified part of right bronchus or lung: Secondary | ICD-10-CM | POA: Diagnosis not present

## 2022-11-07 DIAGNOSIS — E559 Vitamin D deficiency, unspecified: Secondary | ICD-10-CM | POA: Diagnosis not present

## 2022-11-10 DIAGNOSIS — H409 Unspecified glaucoma: Secondary | ICD-10-CM | POA: Diagnosis not present

## 2022-11-10 DIAGNOSIS — E039 Hypothyroidism, unspecified: Secondary | ICD-10-CM | POA: Diagnosis not present

## 2022-11-10 DIAGNOSIS — I10 Essential (primary) hypertension: Secondary | ICD-10-CM | POA: Diagnosis not present

## 2022-11-10 DIAGNOSIS — M1990 Unspecified osteoarthritis, unspecified site: Secondary | ICD-10-CM | POA: Diagnosis not present

## 2022-11-10 DIAGNOSIS — J45998 Other asthma: Secondary | ICD-10-CM | POA: Diagnosis not present

## 2022-11-10 DIAGNOSIS — E785 Hyperlipidemia, unspecified: Secondary | ICD-10-CM | POA: Diagnosis not present

## 2022-12-04 ENCOUNTER — Ambulatory Visit
Admission: RE | Admit: 2022-12-04 | Discharge: 2022-12-04 | Disposition: A | Discharge status: Home / Self Care | Payer: Medicare HMO | Source: Ambulatory Visit | Attending: Radiation Oncology | Admitting: Radiation Oncology

## 2022-12-04 ENCOUNTER — Encounter: Payer: Self-pay | Admitting: Radiation Oncology

## 2022-12-04 NOTE — Progress Notes (Addendum)
Alisha [name] is a 12/04/2022 telephone follow-up appt for Cancer of Middle Lobe of Lung.  Meaningful use complete.  They completed their radiation on: 10/31/2022   Alisha [name] is a 12/04/22 telephone appointment for follow up post radiation to the lung.  Lung Side: RT  Does the patient complain of any of the following: Pain: None Shortness of breath w/wo exertion: None Cough: None Hemoptysis: None Pain with swallowing: None Swallowing/choking concerns: None Appetite: Good Energy Level: Moderate fatigue Post radiation skin Changes: None  Additional comments if applicable: None   This concludes the interview.   Leandra Kern, LPN

## 2022-12-05 ENCOUNTER — Ambulatory Visit: Payer: Self-pay | Admitting: Radiation Oncology

## 2022-12-09 ENCOUNTER — Ambulatory Visit: Admission: RE | Admit: 2022-12-09 | Payer: Medicare HMO | Source: Ambulatory Visit | Admitting: Radiation Oncology

## 2022-12-15 ENCOUNTER — Telehealth: Payer: Self-pay | Admitting: Hematology and Oncology

## 2022-12-15 NOTE — Telephone Encounter (Signed)
Reached out to schedule patient per [place] being out of office, left voicemail for patient to call back for appointment.

## 2022-12-22 DIAGNOSIS — H353132 Nonexudative age-related macular degeneration, bilateral, intermediate dry stage: Secondary | ICD-10-CM | POA: Diagnosis not present

## 2022-12-22 DIAGNOSIS — H35033 Hypertensive retinopathy, bilateral: Secondary | ICD-10-CM | POA: Diagnosis not present

## 2022-12-22 DIAGNOSIS — H40053 Ocular hypertension, bilateral: Secondary | ICD-10-CM | POA: Diagnosis not present

## 2022-12-22 DIAGNOSIS — H402231 Chronic angle-closure glaucoma, bilateral, mild stage: Secondary | ICD-10-CM | POA: Diagnosis not present

## 2022-12-22 DIAGNOSIS — H524 Presbyopia: Secondary | ICD-10-CM | POA: Diagnosis not present

## 2022-12-23 ENCOUNTER — Other Ambulatory Visit: Payer: Self-pay

## 2022-12-23 ENCOUNTER — Telehealth: Payer: Self-pay | Admitting: Cardiology

## 2022-12-23 DIAGNOSIS — I10 Essential (primary) hypertension: Secondary | ICD-10-CM

## 2022-12-23 MED ORDER — AMLODIPINE BESYLATE 10 MG PO TABS: ORAL_TABLET | ORAL | 1 refills | Status: DC

## 2022-12-23 NOTE — Telephone Encounter (Signed)
*  STAT* If patient is at the pharmacy, call can be transferred to refill team.   1. Which medications need to be refilled? (please list name of each medication and dose if known)   amLODipine (NORVASC) 10 MG tablet  valsartan (DIOVAN) 320 MG tablet   2. Which pharmacy/location (including street and city if local pharmacy) is medication to be sent to? [facility] - [place], [place] - [place] [address]   3. Do they need a 30 day or 90 day supply? 90 day

## 2023-01-13 NOTE — Progress Notes (Signed)
Alisha [name] presents today for follow up for completion of radiation for lung cancer and to receive CT scan results from 01/26/2023.   She completed radiation on 10-31-22.  PAIN: Denies  RESPIRATORY: Denies any shortness of breath or productive cough  SWALLOWING/DIET: Reports a stable appetite and denies any difficulty swallowing Wt Readings from Last 3 Encounters:  01/27/23 198 lb (89.8 kg)  11/05/22 199 lb 3.2 oz (90.4 kg)  10/07/22 197 lb (89.4 kg)   OTHER:  She states that since she has been taking the D3 supplement consistently her energy level has improved significantly. Overall reports she feels good and is good spirits.

## 2023-01-26 ENCOUNTER — Ambulatory Visit (HOSPITAL_COMMUNITY)
Admission: RE | Admit: 2023-01-26 | Discharge: 2023-01-26 | Disposition: A | Discharge status: Home / Self Care | Payer: Medicare HMO | Source: Ambulatory Visit | Attending: Radiation Oncology | Admitting: Radiation Oncology

## 2023-01-26 DIAGNOSIS — C342 Malignant neoplasm of middle lobe, bronchus or lung: Secondary | ICD-10-CM | POA: Diagnosis not present

## 2023-01-26 DIAGNOSIS — C3490 Malignant neoplasm of unspecified part of unspecified bronchus or lung: Secondary | ICD-10-CM | POA: Diagnosis not present

## 2023-01-26 DIAGNOSIS — R918 Other nonspecific abnormal finding of lung field: Secondary | ICD-10-CM | POA: Diagnosis not present

## 2023-01-27 ENCOUNTER — Ambulatory Visit
Admission: RE | Admit: 2023-01-27 | Discharge: 2023-01-27 | Disposition: A | Discharge status: Home / Self Care | Payer: Medicare HMO | Source: Ambulatory Visit | Attending: Radiation Oncology | Admitting: Radiation Oncology

## 2023-01-27 ENCOUNTER — Other Ambulatory Visit: Payer: Self-pay

## 2023-01-27 VITALS — BP 148/50 | HR 49 | Temp 97.2°F | Resp 8 | Ht 64.5 in | Wt 198.0 lb

## 2023-01-27 DIAGNOSIS — C3411 Malignant neoplasm of upper lobe, right bronchus or lung: Secondary | ICD-10-CM | POA: Insufficient documentation

## 2023-01-27 DIAGNOSIS — C342 Malignant neoplasm of middle lobe, bronchus or lung: Secondary | ICD-10-CM | POA: Insufficient documentation

## 2023-01-27 NOTE — Progress Notes (Signed)
Radiation Oncology         [phone] ________________________________  Name: Alisha [name] MRN: [id number]  Date: 01/27/2023  DOB: [birth year]  Follow-Up Visit Note  CC: Farris HasMorrow, Aaron, MD  Farris HasMorrow, Aaron, MD  Diagnosis and Prior Radiotherapy:       ICD-10-CM   1. Malignant neoplasm of upper lobe of right lung  C34.11     2. Cancer of middle lobe of lung  C34.2       CHIEF COMPLAINT:  Here for follow-up and surveillance of lung cancer and to receive CT scan results from 01/26/23.  Patient completed radiation on 10/31/22.  First Treatment Date: 2022-10-21 - Last Treatment Date: 2022-10-31 Plan Name: Lung_RUL_SBRT Site: Lung, Right Technique: SBRT/SRT-IMRT Mode: Photon Dose Per Fraction: 11 Gy Prescribed Dose (Delivered / Prescribed): 55 Gy / 55 Gy Prescribed Fxs (Delivered / Prescribed): 5 / 5   Plan Name: Lung_RML_SBRT Site: Lung, Right Technique: SBRT/SRT-IMRT Mode: Photon Dose Per Fraction: 11 Gy Prescribed Dose (Delivered / Prescribed): 55 Gy / 55 Gy Prescribed Fxs (Delivered / Prescribed): 5 / 5   Radiation Treatment Dates: 02/13/2021 through 02/20/2021 Site Technique Total Dose (Gy) Dose per Fx (Gy) Completed Fx Beam Energies  Lung, Right: Lung_Rt_RUL IMRT 54/54 18 3/3 6XFFF    Radiation Treatment Dates: 09/07/2019 through 10/06/2019 Site Technique Total Dose (Gy) Dose per Fx (Gy) Completed Fx Beam Energies  Breast, Left: Breast_Lt 3D 40.05/40.05 2.67 15/15 6X, 10X  Breast, Left: Breast_Lt_Bst specialPort 10/10 2 5/5 15E    Narrative:  The patient returns today for routine follow-up.  Recent CT shows typical radiation changes in the right lung apex and lateral right middle lobe; 6 mm central RUL nodule, previously 8 mm on recent PET; and unchanged small bilateral pulmonary nodules.   PAIN: Denies RESPIRATORY: Denies any shortness of breath or productive cough. SWALLOWING/DIET: Reports a stable appetite and denies any difficulty swallowing    Wt  Readings from Last 3 Encounters:  01/27/23 198 lb (89.8 kg)  11/05/22 199 lb 3.2 oz (90.4 kg)  10/07/22 197 lb (89.4 kg)  OTHER:  She states that since she has been taking the D3 supplement consistently her energy level has improved significantly. Overall reports she feels good and is good spirits.         ALLERGIES:  is allergic to amlodipine, benicar [olmesartan medoxomil], doxepin hcl, estradiol, gabapentin, levofloxacin, lisinopril, nebivolol hcl, olmesartan, tramadol hcl, penicillins, and sulfa antibiotics.  Meds: Current Outpatient Medications  Medication Sig Dispense Refill   albuterol (PROVENTIL HFA;VENTOLIN HFA) 108 (90 Base) MCG/ACT inhaler Inhale 1-2 puffs into the lungs every 6 (six) hours as needed for wheezing or shortness of breath.      amLODipine (NORVASC) 10 MG tablet TAKE ONE TABLET BY MOUTH AT NOON Needs appointment for further refills 90 tablet 1   Artificial Tear Solution (SOOTHE XP OP) Place 2 drops into both eyes 2 (two) times daily.     Biotin 5000 MCG TABS Take 10,000 mcg by mouth daily at 12 noon.     cetaphil (CETAPHIL) cream Apply 1 application  topically daily as needed (dry skin).     chlorthalidone (HYGROTON) 25 MG tablet TAKE ONE TABLET BY MOUTH AT NOON 90 tablet 1   clindamycin (CLEOCIN) 300 MG capsule Take 600 mg by mouth See admin instructions. Take before dental appointments     fluticasone (FLONASE) 50 MCG/ACT nasal spray Place 2 sprays into both nostrils daily as needed for allergies.     furosemide (LASIX) 20  MG tablet Take 20 mg by mouth daily as needed for fluid or edema.     levothyroxine (SYNTHROID, LEVOTHROID) 88 MCG tablet Take 88 mcg by mouth daily before breakfast.      meclizine (ANTIVERT) 12.5 MG tablet Take 12.5 mg by mouth 3 (three) times daily as needed for dizziness.     Multiple Vitamins-Minerals (PRESERVISION AREDS 2+MULTI VIT) CAPS Take 1 capsule by mouth in the morning and at bedtime.     potassium chloride SA (KLOR-CON) 20 MEQ tablet  Take 20 mEq by mouth daily as needed (when taking furosemide).     Probiotic Product (ALIGN) 4 MG CAPS Take 2 capsules by mouth daily. gummy     valsartan (DIOVAN) 320 MG tablet Take 1 tablet (320 mg total) by mouth daily at 12 noon. Will need an appointment to for future refill 30 tablet 0   No current facility-administered medications for this encounter.    Physical Findings: The patient is in no acute distress. Patient is alert and oriented. Wt Readings from Last 3 Encounters:  01/27/23 198 lb (89.8 kg)  11/05/22 199 lb 3.2 oz (90.4 kg)  10/07/22 197 lb (89.4 kg)    height is 5' 4.5" (1.638 m) and weight is 198 lb (89.8 kg). Her temporal temperature is 97.2 F (36.2 C) (abnormal). Her blood pressure is 148/50 (abnormal) and her pulse is 49 (abnormal). Her respiration is 8 (abnormal) and oxygen saturation is 100%. .  General: Alert and oriented, in no acute distress; breathing comfortably, good coloring HEENT: Head is normocephalic. Extraocular movements are intact.  Neck: Neck is notable for no palpable lymphadenopathy.  Heart: Regular in rate and rhythm with no murmurs, rubs, or gallops. Chest: Clear to auscultation bilaterally, with no rhonchi, wheezes, or rales. Abdomen: Soft, nontender, nondistended, with no rigidity or guarding. Extremities: No cyanosis or edema. Lymphatics: see Neck Exam Psychiatric: Judgment and insight are intact. Affect is appropriate.   Lab Findings: Lab Results  Component Value Date   WBC 8.3 09/01/2022   HGB 10.7 (L) 09/01/2022   HCT 32.1 (L) 09/01/2022   MCV 93.9 09/01/2022   PLT 282 09/01/2022    Lab Results  Component Value Date   TSH 2.260 04/29/2022    Radiographic Findings: CT Chest Wo Contrast  Result Date: 01/27/2023 CLINICAL DATA:  Follow-up non-small cell lung cancer EXAM: CT CHEST WITHOUT CONTRAST TECHNIQUE: Multidetector CT imaging of the chest was performed following the standard protocol without IV contrast. RADIATION DOSE  REDUCTION: This exam was performed according to the departmental dose-optimization program which includes automated exposure control, adjustment of the mA and/or kV according to patient size and/or use of iterative reconstruction technique. COMPARISON:  PET-CT dated 08/29/2022 FINDINGS: Cardiovascular: The heart is normal in size. Trace anterior pericardial fluid. No evidence of thoracic aortic aneurysm. Atherosclerotic calcifications of the arch. Mild coronary atherosclerosis of the LAD. Mediastinum/Nodes: No suspicious mediastinal lymphadenopathy. Status post left axillary lymph node dissection. Visualized thyroid is unremarkable. Lungs/Pleura: Fiducial markers in the right lung apex with associated radiation changes (series 5/image 28), without associated focal hypermetabolism on prior PET. Additional 6 mm central right upper lobe nodule (series 5/image 41), previously 8 mm on recent PET. Improvement is reassuring, but this warrants continued attention on follow-up. Additional fiducial marker in the lateral right middle lobe with 2 adjacent nodules measuring up to 5 mm (series 5/image 1), grossly unchanged. Additional scattered bilateral pulmonary nodules, including: --5 mm nodule in the lateral right middle lobe (series 5/image 69), unchanged --5  mm nodule in the medial right lower lobe (series 5/image 76), unchanged --4 mm nodule in the central left lower lobe (series 5/image 78), grossly unchanged No focal consolidation. No pleural effusion or pneumothorax. Upper Abdomen: Visualized upper abdomen is notable for a mildly nodular hepatic contour, a small hiatal hernia, and vascular calcifications. Musculoskeletal: Status post left mastectomy. Degenerative changes of the visualized thoracolumbar spine. Stable sclerosis involving the sternum, non FDG avid on prior PET. IMPRESSION: Radiation changes in the right lung apex and lateral right middle lobe. 6 mm central right upper lobe nodule, previously 8 mm on recent  PET. Improvement is reassuring, but this warrants continued attention on follow-up. Additional small bilateral pulmonary nodules measuring up to 5 mm, grossly unchanged. Status post left mastectomy with left axillary lymph node dissection. Aortic Atherosclerosis (ICD10-I70.0). Electronically Signed   By: Charline Bills M.D.   On: 01/27/2023 00:19    Impression/Plan:    1) It was a pleasure seeing this patient again today. She continues to heal well from the radiation. Recent imaging shows favorable response to radiation treatment. I was very pleased to share these results with the patient.   2) Follow-up in 6 months with repeat chest CT without contrast. The patient was encouraged to call with any issues or questions before then.  On date of service, in total, I spent 20 minutes on this encounter. Patient was seen in person. _____________________________________   Joyice Faster, PA   Lonie Peak, MD

## 2023-01-28 ENCOUNTER — Encounter: Payer: Self-pay | Admitting: Radiation Oncology

## 2023-01-30 ENCOUNTER — Ambulatory Visit: Payer: Medicare HMO | Admitting: Podiatry

## 2023-01-30 VITALS — BP 149/57

## 2023-01-30 DIAGNOSIS — L84 Corns and callosities: Secondary | ICD-10-CM

## 2023-01-30 DIAGNOSIS — M79675 Pain in left toe(s): Secondary | ICD-10-CM

## 2023-01-30 DIAGNOSIS — G629 Polyneuropathy, unspecified: Secondary | ICD-10-CM | POA: Diagnosis not present

## 2023-01-30 DIAGNOSIS — B351 Tinea unguium: Secondary | ICD-10-CM | POA: Diagnosis not present

## 2023-01-30 DIAGNOSIS — M79674 Pain in right toe(s): Secondary | ICD-10-CM | POA: Diagnosis not present

## 2023-01-30 NOTE — Progress Notes (Unsigned)
  Subjective:  Patient ID: Alisha [name], female    DOB: [birth year],  MRN: [id number]  Alisha Celeste Syler presents to clinic today for {jgcomplaint:23593}  Chief Complaint  Patient presents with   Nail Problem    RFC/pain on lateral side of foot PCP-[facility] PCP VST-10/2022   New problem(s): None. {jgcomplaint:23593}  PCP is Farris Has, MD.  Allergies  Allergen Reactions   Amlodipine Other (See Comments)    Other reaction(s): swelling, ha, itching   Benicar [Olmesartan Medoxomil] Other (See Comments)    Unknown   Doxepin Hcl     Other reaction(s): Body swelling   Estradiol     Other reaction(s): itching   Gabapentin     Other reaction(s): swelling   Levofloxacin     Other reaction(s): Unknown   Lisinopril Itching    No energy Other reaction(s): itching, hair loss   Nebivolol Hcl     Other reaction(s): fatigue   Olmesartan     Other reaction(s): hair loss, nail changes   Tramadol Hcl     Other reaction(s): abdominal pain   Penicillins Itching and Rash    Has patient had a PCN reaction causing immediate rash, facial/tongue/throat swelling, SOB or lightheadedness with hypotension: No Has patient had a PCN reaction causing severe rash involving mucus membranes or skin necrosis: No Has patient had a PCN reaction that required hospitalization: No Has patient had a PCN reaction occurring within the last 10 years: No If all of the above answers are "NO", then may proceed with Cephalosporin use.   Sulfa Antibiotics Itching and Rash    Other reaction(s): Unknown    Review of Systems: Negative except as noted in the HPI.  Objective: No changes noted in today's physical examination. Vitals:   01/30/23 1002  BP: (!) 149/57    Alisha [name] is a pleasant 88 y.o. female {jgbodyhabitus:24098} AAO x 3.  Vascular CFT <3 seconds b/l LE. Palpable pedal pulses b/l LE. Pedal hair sparse. No pain with calf compression b/l. No edema noted b/l LE. No cyanosis or  clubbing noted b/l LE.  Neurologic Normal speech. Oriented to person, place, and time. Pt has subjective symptoms of neuropathy.Protective sensation intact 5/5 intact bilaterally with 10g monofilament b/l.  Dermatologic Pedal skin warm and supple b/l.  No open wounds b/l. No interdigital macerations. Toenails 1-5 b/l elongated, thickened, discolored with subungual debris. +Tenderness with dorsal palpation of nailplates. Porokeratotic lesion(s) noted submet head 5 bilaterally. No erythema, no edema, no drainage, no fluctuance.  Orthopedic: Muscle strength 5/5 to all lower extremity muscle groups bilaterally. No pain, crepitus or joint limitation noted with ROM bilateral LE. Hammertoe(s) noted to the left great toe, L 2nd toe, and L 3rd toe.   Radiographs: None  Assessment/Plan: No diagnosis found.  No orders of the defined types were placed in this encounter.   None {Jgplan:23602::"-Patient/POA to call should there be question/concern in the interim."}   No follow-ups on file.  Freddie Breech, DPM

## 2023-02-02 ENCOUNTER — Encounter: Payer: Self-pay | Admitting: Podiatry

## 2023-02-13 DIAGNOSIS — H524 Presbyopia: Secondary | ICD-10-CM | POA: Diagnosis not present

## 2023-02-16 DIAGNOSIS — C50912 Malignant neoplasm of unspecified site of left female breast: Secondary | ICD-10-CM | POA: Diagnosis not present

## 2023-03-03 ENCOUNTER — Ambulatory Visit: Payer: Medicare HMO | Admitting: Hematology and Oncology

## 2023-03-04 DIAGNOSIS — C50912 Malignant neoplasm of unspecified site of left female breast: Secondary | ICD-10-CM | POA: Diagnosis not present

## 2023-03-10 DIAGNOSIS — C50912 Malignant neoplasm of unspecified site of left female breast: Secondary | ICD-10-CM | POA: Diagnosis not present

## 2023-03-11 ENCOUNTER — Ambulatory Visit: Payer: Medicare HMO | Admitting: Hematology and Oncology

## 2023-03-26 ENCOUNTER — Other Ambulatory Visit: Payer: Self-pay

## 2023-03-26 ENCOUNTER — Inpatient Hospital Stay: Payer: Medicare HMO | Attending: Hematology and Oncology | Admitting: Hematology and Oncology

## 2023-03-26 VITALS — BP 145/87 | HR 55 | Temp 97.7°F | Resp 18 | Wt 200.0 lb

## 2023-03-26 DIAGNOSIS — Z9012 Acquired absence of left breast and nipple: Secondary | ICD-10-CM

## 2023-03-26 DIAGNOSIS — C50412 Malignant neoplasm of upper-outer quadrant of left female breast: Secondary | ICD-10-CM

## 2023-03-26 DIAGNOSIS — C3411 Malignant neoplasm of upper lobe, right bronchus or lung: Secondary | ICD-10-CM

## 2023-03-26 DIAGNOSIS — Z171 Estrogen receptor negative status [ER-]: Secondary | ICD-10-CM | POA: Diagnosis not present

## 2023-03-26 NOTE — Progress Notes (Signed)
[facility]  Telephone:([phone]) [phone] [phone]     ID: Alisha [name] DOB: 1935/03/08  MR#: [id number]  ONG#:[id number]  Patient Care Team: Farris Has, MD as PCP - General (Family Medicine) Marykay Lex, MD as PCP - Cardiology (Cardiology) Lonie Peak, MD as Attending Physician (Radiation Oncology) Marykay Lex, MD as Consulting Physician (Cardiology) Leslye Peer, MD as Consulting Physician (Pulmonary Disease) Rachel Moulds, MD as Consulting Physician (Hematology and Oncology) Almond Lint, MD as Consulting Physician (General Surgery) Rachel Moulds, MD  CHIEF COMPLAINT: triple negative breast cancer; squamous cell lung cancer  CURRENT TREATMENT: Observation   INTERVAL HISTORY:  Dale returns today for follow up of her triple negative breast cancer and non small cell lung cancer. She is doing well today except for some fatigue. Fatigue happens in episodes. She is now on Vit D supplementation and this has helped tremendously. She says she has been walking some and feels better. She denies any breast masses. No cough, chest pain or SOB. Rest of the pertinent 10 point ROS reviewed and negative.   COVID 19 VACCINATION STATUS: infection 11/2019; Pfizer x4, most recently 03/2021   Oncology History  Alisha [name] was noted to have a left breast upper-outer quadrant distortion on screening mammogram in 2017. She proceeded to biopsy ([id number]), which showed a complex sclerosing lesion. She opted to undergo left lumpectomy ([id number]) on 08/11/2016 under Dr. Luisa Hart, which revealed a radial scar with calcifications.   She presented for routine screening mammogram, which showed a possible mass and adjacent calcifications in the left breast. She was referred to [facility] for further imaging on 06/16/2019. Physical exam performed that day showed a palpable soft thickening in the upper central aspect of the left breast. She  underwent left diagnostic mammography with tomography and ultrasonography showing: breast density category B; suspicious 7 mm mass in the 12 o'clock location of the left breast; small indeterminate group of calcifications 4 mm anterior to the mass warrant excision if the mass is positive for malignancy; left axilla negative for adenopathy.  Accordingly on 06/22/2019 she proceeded to biopsy of the left breast area in question. The pathology from this procedure ([id number]) showed: invasive ductal carcinoma, grade 3. Prognostic indicators significant for: estrogen receptor, 0% negative and progesterone receptor, 0% negative. Proliferation marker Ki67 at 30%. HER2 negative by immunohistochemistry (0).  (1) status post left lumpectomy and sentinel lymph node sampling 07/27/2019 for a pT1b pN0, stage IB invasive ductal carcinoma, with clear margins. (a) 2 left axillary lymph nodes were removed No adjuvant chemotherapy planned  Adjuvant radiation 09/07/2019 through 10/06/2019 Site Technique Total Dose (Gy) Dose per Fx (Gy) Completed Fx Beam Energies  Breast, Left: Breast_Lt 3D 40.05/40.05 2.67 15/15 6X, 10X  Breast, Left: Breast_Lt_Bst specialPort 10/10 2 5/5 15E    LUNG CANCER CT of the chest 11/14/2020 showed a new (as compared to July 2020) right upper lobe nodule measuring 0.9 cm, with no other findings of concern  (a) PET scan 12/07/2020 find the right upper lobe nodule in question to be hypermetabolic, with no other hypermetabolic areas elsewhere in the chest or abdomen  (b) CA 27-29 and CEA obtained 12/20/2020 were in the normal range  (c) bronchoscopic biopsy 12/27/2020 read as consistent with squamous cell carcinoma  Radiation to lung with curative intent 02/13/2021 through 02/20/2021 Site Technique Total Dose (Gy) Dose per Fx (Gy) Completed Fx Beam Energies  Lung, Right: Lung_Rt_RUL IMRT 54/54 18 3/3 6XFFF    10/18/2021  Left  mammogram showed a 1.0 cm mass in the left breast at 10:00, no  evidence of left axillary lymphadenopathy Biopsy from this mass showed invasive ductal carcinoma grade 2 of 3, prognostic markers show ER 0% negative, PR 0% negative, KI of 20% and HER2 negative.  11/26/2021 patient underwent left mastectomy, final pathology showed invasive ductal carcinoma, 2 cm, grade, resection margins are -3 sentinel lymph nodes negative for carcinoma, prognostics not repeated.  Initial prognostics ER 0% negative, PR 0% negative, HER2 negative, Ki-67 of 20%.  During her last visit, we have discussed about TC adjuvant chemotherapy for 4 cycles. She declined chemotherapy  Dec 2023, RUL RML NSCLC, pt completed SBRT on 10/31/2022 Most recent CT showed improvement in lung nodules, 6 month follow up recommended by Dr Basilio Cairo.  PAST MEDICAL HISTORY: Past Medical History:  Diagnosis Date   Arthritis    Asthma    Never smoker   Blood transfusion    over 40 y ears   Cancer (HCC)    CHF (congestive heart failure) (HCC)    diastolic   COVID 7829   n/v/diarrhea   Dysrhythmia    Bradycardia per Dr. Elissa Hefty note on 04/2022   History of radiation therapy 09/07/19- 10/06/19   Left Breast 15 fractions of 2.67 Gy to total 40.05 Gy. Left breast boost 5 fractions of 2 Gy each to total 10 Gy   Hypertension    on meds   Hypothyroidism    on meds   Malignant neoplasm of left breast metastatic to lung (HCC) 06/22/2019   a) 9/'20 ->  Upper-Outer Quadrant of Left Breast: Clinical stage from 06/22/2019: Stage IB (cT1b, cN0, cM0, G3, ER-, PR-, HER2-) => L Lumpectomy & XRT - (f/u Path 11/21: Stage 1B (pT1b, pN0,cM0; 1/'23: Recurrent Upper-Inner Quadrant ( Clinical Stage cT1, cN0, cM0) -mastectomy; now treated with XRT.   Malignant neoplasm of right upper lobe of lung (HCC) 01/23/2021   Stage IB (cT1c, cN0, cM0, G2, ER-, PR-, HER2-   Personal history of radiation therapy    Pre-diabetes     PAST SURGICAL HISTORY: Past Surgical History:  Procedure Laterality Date   ABDOMINAL HYSTERECTOMY      32 yrs   AXILLARY SENTINEL NODE BIOPSY Left 11/26/2021   Procedure: LEFT AXILLARY SENTINEL NODE BIOPSY;  Surgeon: Almond Lint, MD;  Location: [facility];  Service: General;  Laterality: Left;   BREAST LUMPECTOMY Left 08/11/2016   BREAST LUMPECTOMY Left 07/27/2019   BREAST LUMPECTOMY WITH RADIOACTIVE SEED AND SENTINEL LYMPH NODE BIOPSY Left 07/27/2019   Procedure: LEFT BREAST LUMPECTOMY WITH RADIOACTIVE SEED AND LEFT SENTINEL LYMPH NODE MAPPING;  Surgeon: Harriette Bouillon, MD;  Location: [facility];  Service: General;  Laterality: Left;   BREAST LUMPECTOMY WITH RADIOACTIVE SEED LOCALIZATION Left 08/11/2016   Procedure: LEFT BREAST LUMPECTOMY WITH RADIOACTIVE SEED LOCALIZATION;  Surgeon: Harriette Bouillon, MD;  Location: [facility];  Service: General;  Laterality: Left;   BRONCHIAL BIOPSY  12/27/2020   Procedure: BRONCHIAL BIOPSIES;  Surgeon: Leslye Peer, MD;  Location: [facility] ENDOSCOPY;  Service: Pulmonary;;   BRONCHIAL BIOPSY  09/29/2022   Procedure: BRONCHIAL BIOPSIES;  Surgeon: Leslye Peer, MD;  Location: [facility] ENDOSCOPY;  Service: Pulmonary;;   BRONCHIAL BRUSHINGS  12/27/2020   Procedure: BRONCHIAL BRUSHINGS;  Surgeon: Leslye Peer, MD;  Location: [facility] ENDOSCOPY;  Service: Pulmonary;;   BRONCHIAL BRUSHINGS  09/29/2022   Procedure: BRONCHIAL BRUSHINGS;  Surgeon: Leslye Peer, MD;  Location: [facility] ENDOSCOPY;  Service: Pulmonary;;   BRONCHIAL NEEDLE ASPIRATION  BIOPSY  12/27/2020   Procedure: BRONCHIAL NEEDLE ASPIRATION BIOPSIES;  Surgeon: Leslye Peer, MD;  Location: [facility] ENDOSCOPY;  Service: Pulmonary;;   BRONCHIAL NEEDLE ASPIRATION BIOPSY  09/29/2022   Procedure: BRONCHIAL NEEDLE ASPIRATION BIOPSIES;  Surgeon: Leslye Peer, MD;  Location: [facility] ENDOSCOPY;  Service: Pulmonary;;   BRONCHIAL WASHINGS  12/27/2020   Procedure: BRONCHIAL WASHINGS;  Surgeon: Leslye Peer, MD;  Location: [facility] ENDOSCOPY;  Service: Pulmonary;;   BRONCHIAL WASHINGS  09/29/2022   Procedure: BRONCHIAL WASHINGS;   Surgeon: Leslye Peer, MD;  Location: [facility] ENDOSCOPY;  Service: Pulmonary;;   CATARACT EXTRACTION, BILATERAL     CORONARY CALCIUM SCORE-CT ANGIOGRAM  04/2019   Small, < 5mm nodule noted.  Difficult study to read.  Coronary calcium score was 11.  Significant motion artifact, but visible territory showed minimal disease.  Minimal calcium noted in the ostial LAD but no suggestion of significant CAD.   EYE SURGERY     FIDUCIAL MARKER PLACEMENT  12/27/2020   Procedure: FIDUCIAL MARKER PLACEMENT;  Surgeon: Leslye Peer, MD;  Location: [facility] ENDOSCOPY;  Service: Pulmonary;;   FIDUCIAL MARKER PLACEMENT  09/29/2022   Procedure: FIDUCIAL MARKER PLACEMENT;  Surgeon: Leslye Peer, MD;  Location: [facility] ENDOSCOPY;  Service: Pulmonary;;   JOINT REPLACEMENT Bilateral    left knee 2012   KNEE ARTHROPLASTY  02/17/2012   Procedure: COMPUTER ASSISTED TOTAL KNEE ARTHROPLASTY;  Surgeon: Cammy Copa, MD;  Location: [facility];  Service: Orthopedics;  Laterality: Right;  Right total knee replacement   MASTECTOMY W/ SENTINEL NODE BIOPSY Left 11/26/2021   Procedure: LEFT BREAST MASTECTOMY;  Surgeon: Almond Lint, MD;  Location: [facility];  Service: General;  Laterality: Left;   TRANSTHORACIC ECHOCARDIOGRAM  04/02/2020   Normal LV function.  No R WMA.  Moderate LVH.  GRII DD with elevated filling pressures.  Moderate LA dilation with trivial MR.  Mild aortic valve sclerosis.   VIDEO BRONCHOSCOPY WITH ENDOBRONCHIAL NAVIGATION N/A 12/27/2020   Procedure: VIDEO BRONCHOSCOPY WITH ENDOBRONCHIAL NAVIGATION;  Surgeon: Leslye Peer, MD;  Location: [facility] ENDOSCOPY;  Service: Pulmonary;  Laterality: N/A;   VIDEO BRONCHOSCOPY WITH RADIAL ENDOBRONCHIAL ULTRASOUND  09/29/2022   Procedure: VIDEO BRONCHOSCOPY WITH RADIAL ENDOBRONCHIAL ULTRASOUND;  Surgeon: Leslye Peer, MD;  Location: [facility] ENDOSCOPY;  Service: Pulmonary;;    FAMILY HISTORY: Family History  Problem Relation Age of Onset   Other Mother        died @ 73 - but was  healthy   Hypertension Sister    Diabetes Sister    Diabetes Brother        no longer on medications.    Anesthesia problems Neg Hx    Hypotension Neg Hx    Malignant hyperthermia Neg Hx    Pseudochol deficiency Neg Hx   Patient's father was in his mid 10s years old when he died from unknown causes. Patient's mother died from "natural causes" at age 76. The patient denies a family hx of breast or ovarian cancer. She has 5 brothers and 1 sister.   GYNECOLOGIC HISTORY:  No LMP recorded. Patient is postmenopausal. 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  She is now retired.  She describes herself a single.  Her daughter  [employer] lives in [place].  She has multiple medical problems.  The patient's son died at age 68.  The patient has 1 grandchild, Baldomero Lamy, who is buying a home 5 minutes from the patient t and works for [employer].  He has 4 children, 2 have gone through college, one is "in between" and the other 1 is currently in college.  The patient herself attends a local [employer]   ADVANCED DIRECTIVES: The patient intends to name her grandson Baldomero Lamy as her healthcare power of attorney.  He can be reached at [phone].  She was given the appropriate documents to complete and notarize at her discretion at the time of her 07/21/2019 visit   HEALTH MAINTENANCE: Social History   Tobacco Use   Smoking status: Never   Smokeless tobacco: Never  Vaping Use   Vaping Use: Never used  Substance Use Topics   Alcohol use: No   Drug use: No     Allergies  Allergen Reactions   Amlodipine Other (See Comments)    Other reaction(s): swelling, ha, itching   Benicar  [Olmesartan Medoxomil] Other (See Comments)    Unknown   Doxepin Hcl     Other reaction(s): Body swelling   Estradiol     Other reaction(s): itching   Gabapentin     Other reaction(s): swelling   Levofloxacin     Other reaction(s): Unknown   Lisinopril Itching    No energy Other reaction(s): itching, hair loss   Nebivolol Hcl     Other reaction(s): fatigue   Olmesartan     Other reaction(s): hair loss, nail changes   Tramadol Hcl     Other reaction(s): abdominal pain   Penicillins Itching and Rash    Has patient had a PCN reaction causing immediate rash, facial/tongue/throat swelling, SOB or lightheadedness with hypotension: No Has patient had a PCN reaction causing severe rash involving mucus membranes or skin necrosis: No Has patient had a PCN reaction that required hospitalization: No Has patient had a PCN reaction occurring within the last 10 years: No If all of the above answers are "NO", then may proceed with Cephalosporin use.   Sulfa Antibiotics Itching and Rash    Other reaction(s): Unknown    Current Outpatient Medications  Medication Sig Dispense Refill   albuterol (PROVENTIL HFA;VENTOLIN HFA) 108 (90 Base) MCG/ACT inhaler Inhale 1-2 puffs into the lungs every 6 (six) hours as needed for wheezing or shortness of breath.      amLODipine (NORVASC) 10 MG tablet TAKE ONE TABLET BY MOUTH AT NOON Needs appointment for further refills 90 tablet 1   Artificial Tear Solution (SOOTHE XP OP) Place 2 drops into both eyes 2 (two) times daily.     Biotin 5000 MCG TABS Take 10,000 mcg by mouth daily at 12 noon.     cetaphil (CETAPHIL) cream Apply 1 application  topically daily as needed (dry skin).     chlorthalidone (HYGROTON) 25 MG tablet TAKE ONE TABLET BY MOUTH AT NOON 90 tablet 1   clindamycin (CLEOCIN) 300 MG capsule Take 600 mg by mouth See admin instructions. Take before dental appointments     fluticasone (FLONASE) 50 MCG/ACT nasal spray Place 2 sprays into both nostrils  daily as needed for allergies.     furosemide (LASIX) 20 MG tablet Take 20 mg by mouth daily as needed for fluid or edema.     levothyroxine (SYNTHROID, LEVOTHROID) 88 MCG tablet Take 88 mcg by mouth daily before breakfast.  meclizine (ANTIVERT) 12.5 MG tablet Take 12.5 mg by mouth 3 (three) times daily as needed for dizziness.     Multiple Vitamins-Minerals (PRESERVISION AREDS 2+MULTI VIT) CAPS Take 1 capsule by mouth in the morning and at bedtime.     potassium chloride SA (KLOR-CON) 20 MEQ tablet Take 20 mEq by mouth daily as needed (when taking furosemide).     Probiotic Product (ALIGN) 4 MG CAPS Take 2 capsules by mouth daily. gummy     valsartan (DIOVAN) 320 MG tablet Take 1 tablet (320 mg total) by mouth daily at 12 noon. Will need an appointment to for future refill 30 tablet 0   No current facility-administered medications for this visit.    OBJECTIVE: African-American woman who appears stated age  There were no vitals filed for this visit.     There is no height or weight on file to calculate BMI.   Wt Readings from Last 3 Encounters:  01/27/23 198 lb (89.8 kg)  11/05/22 199 lb 3.2 oz (90.4 kg)  10/07/22 197 lb (89.4 kg)      ECOG FS:2 - Symptomatic, <50% confined to bed  Physical Exam Constitutional:      Appearance: Normal appearance.  Chest:     Comments: Right breast normal to inspection and palpation. Left breast s/p mastectomy. No palpable changes No lymphadenopathy Musculoskeletal:     Cervical back: Normal range of motion and neck supple. No rigidity.  Lymphadenopathy:     Cervical: No cervical adenopathy.  Neurological:     Mental Status: She is alert.      LAB RESULTS:  CMP     Component Value Date/Time   NA 140 09/01/2022 1319   NA 141 01/24/2021 1627   K 3.6 09/01/2022 1319   CL 105 09/01/2022 1319   CO2 27 09/01/2022 1319   GLUCOSE 86 09/01/2022 1319   BUN 21 09/01/2022 1319   BUN 25 01/24/2021 1627   CREATININE 0.88 09/01/2022 1319    CREATININE 0.78 07/21/2019 1525   CALCIUM 9.2 09/01/2022 1319   PROT 7.0 09/01/2022 1319   ALBUMIN 4.5 09/01/2022 1319   AST 16 09/01/2022 1319   AST 15 07/21/2019 1525   ALT 10 09/01/2022 1319   ALT 19 07/21/2019 1525   ALKPHOS 61 09/01/2022 1319   BILITOT 0.3 09/01/2022 1319   BILITOT 0.2 (L) 07/21/2019 1525   GFRNONAA >60 09/01/2022 1319   GFRNONAA >60 07/21/2019 1525   GFRAA 76 12/13/2020 1035   GFRAA >60 07/21/2019 1525    No results found for: "TOTALPROTELP", "ALBUMINELP", "A1GS", "A2GS", "BETS", "BETA2SER", "GAMS", "MSPIKE", "SPEI"  No results found for: "KPAFRELGTCHN", "LAMBDASER", "KAPLAMBRATIO"  Lab Results  Component Value Date   WBC 8.3 09/01/2022   NEUTROABS 4.8 09/01/2022   HGB 10.7 (L) 09/01/2022   HCT 32.1 (L) 09/01/2022   MCV 93.9 09/01/2022   PLT 282 09/01/2022   No results found for: "LABCA2"  No components found for: "ZOXWRU045"  No results for input(s): "INR" in the last 168 hours.  No results found for: "LABCA2"  No results found for: "WUJ811"  No results found for: "CAN125"  No results found for: "BJY782"  Lab Results  Component Value Date   CA2729 15.8 12/20/2020    No components found for: "HGQUANT"  Lab Results  Component Value Date   CEA1 1.24 12/20/2020   /  CEA ([facility])  Date Value Ref Range Status  12/20/2020 1.24 0.00 - 5.00 ng/mL Final    Comment:    (  NOTE) This test was performed using Architect's Chemiluminescent Microparticle Immunoassay. Values obtained from different assay methods cannot be used interchangeably. Please note that 5-10% of patients who smoke may see CEA levels up to 6.9 ng/mL. Performed at [facility], [address] [place], [place] [address]      No results found for: "AFPTUMOR"  No results found for: "CHROMOGRNA"  No results found for: "HGBA", "HGBA2QUANT", "HGBFQUANT", "HGBSQUAN" (Hemoglobinopathy evaluation)   No results found for: "LDH"  Lab Results   Component Value Date   IRON 62 04/14/2022   TIBC 256 04/14/2022   IRONPCTSAT 24 04/14/2022   (Iron and TIBC)  Lab Results  Component Value Date   FERRITIN 204 04/14/2022    Urinalysis    Component Value Date/Time   COLORURINE YELLOW 07/02/2018 1752   APPEARANCEUR CLEAR 07/02/2018 1752   LABSPEC 1.021 07/02/2018 1752   PHURINE 5.0 07/02/2018 1752   GLUCOSEU NEGATIVE 07/02/2018 1752   HGBUR NEGATIVE 07/02/2018 1752   BILIRUBINUR NEGATIVE 07/02/2018 1752   KETONESUR 20 (A) 07/02/2018 1752   PROTEINUR NEGATIVE 07/02/2018 1752   UROBILINOGEN 1.0 02/20/2012 0927   NITRITE NEGATIVE 07/02/2018 1752   LEUKOCYTESUR NEGATIVE 07/02/2018 1752    STUDIES: No results found.   ELIGIBLE FOR AVAILABLE RESEARCH PROTOCOL: no  ASSESSMENT:   88 y.o. [employer], [place] with recently diagnosed left breast upper outer quadrant invasive ductal carcinoma, triple negative who is here for follow-up.  Please refer to oncological history for complete history.  Her last breast cancer back in 2020 was in the left breast at 12 o'clock position again IDC triple negative.  At that time she had surgery, adjuvant radiation, no adjuvant chemotherapy.  Since then she also had lung cancer which was treated with SBRT with excellent response.  She then had an abnormal mammogram of the left breast which showed a 1 cm tumor, triple negative on the initial biopsy, grade 2.  Discussion was to consider upfront surgery given her age and she is now status post left mastectomy.  Final pathology showed 2 cm, grade 2 tumor, prognostics not repeated, initial prognostics ER 0%, PR 0% and HER2 negative. Given triple negative pathology and tumor greater than 5 mm, we have discussed about adjuvant docetaxel and cyclophosphamide every 21 days for 4 cycles especially since she has robust performance status. We have discussed about adverse effects of chemotherapy including but not limited to fatigue, nausea, vomiting, diarrhea,  increased risk of cytopenias, neuropathy, infections etc.  She declined adjuvant chemotherapy. Since last visit she had a PET imaging which showed 8 mm RUL nodule, borderline in size for PET resolution, no other major concerns. Sheis currently scheduled for navigational bronchoscopy by Dr Delton Coombes on 11.20. This showed NSCLC, completed SBRT No concerns for recurrence. Mammogram and  left axillary [place], recommended by Dr Donell Beers scheduled for Nov. At this time, no concerns for breast cancer recurrence.  I ordered her repeat mammogram and left axillary ultrasound for November.  She will continue to follow-up with Dr. Basilio Cairo for management of her non-small cell lung cancer with repeat CT anticipated in about 6 months.  She was encouraged to call us with any new or worsening concerns.  Total encounter time 30 minutes.  *Total Encounter Time as defined by the Centers for Medicare and Medicaid Services includes, in addition to the face-to-face time of a patient visit (documented in the note above) non-face-to-face time: obtaining and reviewing outside history, ordering and reviewing medications, tests or procedures, care coordination (  communications with other health care professionals or caregivers) and documentation in the medical record.

## 2023-04-07 ENCOUNTER — Other Ambulatory Visit: Payer: Self-pay | Admitting: Cardiology

## 2023-05-14 DIAGNOSIS — E119 Type 2 diabetes mellitus without complications: Secondary | ICD-10-CM | POA: Diagnosis not present

## 2023-05-14 DIAGNOSIS — E559 Vitamin D deficiency, unspecified: Secondary | ICD-10-CM | POA: Diagnosis not present

## 2023-05-14 DIAGNOSIS — E1169 Type 2 diabetes mellitus with other specified complication: Secondary | ICD-10-CM | POA: Diagnosis not present

## 2023-05-14 DIAGNOSIS — E039 Hypothyroidism, unspecified: Secondary | ICD-10-CM | POA: Diagnosis not present

## 2023-05-14 DIAGNOSIS — I700 Atherosclerosis of aorta: Secondary | ICD-10-CM | POA: Diagnosis not present

## 2023-05-14 DIAGNOSIS — I110 Hypertensive heart disease with heart failure: Secondary | ICD-10-CM | POA: Diagnosis not present

## 2023-05-14 DIAGNOSIS — Z853 Personal history of malignant neoplasm of breast: Secondary | ICD-10-CM | POA: Diagnosis not present

## 2023-05-14 DIAGNOSIS — I5032 Chronic diastolic (congestive) heart failure: Secondary | ICD-10-CM | POA: Diagnosis not present

## 2023-05-14 DIAGNOSIS — Z0000 Encounter for general adult medical examination without abnormal findings: Secondary | ICD-10-CM | POA: Diagnosis not present

## 2023-05-14 DIAGNOSIS — D649 Anemia, unspecified: Secondary | ICD-10-CM | POA: Diagnosis not present

## 2023-05-14 DIAGNOSIS — R5383 Other fatigue: Secondary | ICD-10-CM | POA: Diagnosis not present

## 2023-05-25 ENCOUNTER — Encounter: Payer: Self-pay | Admitting: Nurse Practitioner

## 2023-05-25 ENCOUNTER — Ambulatory Visit: Payer: Medicare HMO | Admitting: Nurse Practitioner

## 2023-05-25 VITALS — BP 112/58 | HR 55 | Ht 64.0 in | Wt 196.0 lb

## 2023-05-25 DIAGNOSIS — I5032 Chronic diastolic (congestive) heart failure: Secondary | ICD-10-CM | POA: Diagnosis not present

## 2023-05-25 DIAGNOSIS — R5383 Other fatigue: Secondary | ICD-10-CM

## 2023-05-25 DIAGNOSIS — I10 Essential (primary) hypertension: Secondary | ICD-10-CM | POA: Diagnosis not present

## 2023-05-25 DIAGNOSIS — R001 Bradycardia, unspecified: Secondary | ICD-10-CM

## 2023-05-25 NOTE — Patient Instructions (Signed)
Medication Instructions:  Your physician recommends that you continue on your current medications as directed. Please refer to the Current Medication list given to you today.  *If you need a refill on your cardiac medications before your next appointment, please call your pharmacy*   Lab Work: NONE ordered at this time of appointment    Testing/Procedures: NONE ordered at this time of appointment     Follow-Up: At [facility], you and your health needs are our priority.  As part of our continuing mission to provide you with exceptional heart care, we have created designated Provider Care Teams.  These Care Teams include your primary Cardiologist (physician) and Advanced Practice Providers (APPs -  Physician Assistants and Nurse Practitioners) who all work together to provide you with the care you need, when you need it.  We recommend signing up for the patient portal called "MyChart".  Sign up information is provided on this After Visit Summary.  MyChart is used to connect with patients for Virtual Visits (Telemedicine).  Patients are able to view lab/test results, encounter notes, upcoming appointments, etc.  Non-urgent messages can be sent to your provider as well.   To learn more about what you can do with MyChart, go to [email or web address].    Your next appointment:   3 month(s)  Provider:   Emily Monge, NP           

## 2023-05-25 NOTE — Progress Notes (Signed)
Office Visit    Patient Name: Alisha [name] Date of Encounter: 05/25/2023  Primary Care Provider:  Farris Has, MD Primary Cardiologist:  Bryan Lemma, MD  Chief Complaint    88 year old female with a history of chronic diastolic heart failure, bradycardia, hypertension, aortic atherosclerosis, hypothyroidism, breast cancer, lung cancer who presents for follow-up related to heart failure and hypertension.   Past Medical History    Past Medical History:  Diagnosis Date   Arthritis    Asthma    Never smoker   Blood transfusion    over 40 y ears   Cancer [facility])    CHF (congestive heart failure) (HCC)    diastolic   COVID 4098   n/v/diarrhea   Dysrhythmia    Bradycardia per Dr. Elissa Hefty note on 04/2022   History of radiation therapy 09/07/19- 10/06/19   Left Breast 15 fractions of 2.67 Gy to total 40.05 Gy. Left breast boost 5 fractions of 2 Gy each to total 10 Gy   Hypertension    on meds   Hypothyroidism    on meds   Malignant neoplasm of left breast metastatic to lung (HCC) 06/22/2019   a) 9/'20 ->  Upper-Outer Quadrant of Left Breast: Clinical stage from 06/22/2019: Stage IB (cT1b, cN0, cM0, G3, ER-, PR-, HER2-) => L Lumpectomy & XRT - (f/u Path 11/21: Stage 1B (pT1b, pN0,cM0; 1/'23: Recurrent Upper-Inner Quadrant ( Clinical Stage cT1, cN0, cM0) -mastectomy; now treated with XRT.   Malignant neoplasm of right upper lobe of lung (HCC) 01/23/2021   Stage IB (cT1c, cN0, cM0, G2, ER-, PR-, HER2-   Personal history of radiation therapy    Pre-diabetes    Past Surgical History:  Procedure Laterality Date   ABDOMINAL HYSTERECTOMY     32 yrs   AXILLARY SENTINEL NODE BIOPSY Left 11/26/2021   Procedure: LEFT AXILLARY SENTINEL NODE BIOPSY;  Surgeon: Almond Lint, MD;  Location: [facility] OR;  Service: General;  Laterality: Left;   BREAST LUMPECTOMY Left 08/11/2016   BREAST LUMPECTOMY Left 07/27/2019   BREAST LUMPECTOMY WITH RADIOACTIVE SEED AND SENTINEL LYMPH NODE BIOPSY Left  07/27/2019   Procedure: LEFT BREAST LUMPECTOMY WITH RADIOACTIVE SEED AND LEFT SENTINEL LYMPH NODE MAPPING;  Surgeon: Harriette Bouillon, MD;  Location: [facility] OR;  Service: General;  Laterality: Left;   BREAST LUMPECTOMY WITH RADIOACTIVE SEED LOCALIZATION Left 08/11/2016   Procedure: LEFT BREAST LUMPECTOMY WITH RADIOACTIVE SEED LOCALIZATION;  Surgeon: Harriette Bouillon, MD;  Location: [facility];  Service: General;  Laterality: Left;   BRONCHIAL BIOPSY  12/27/2020   Procedure: BRONCHIAL BIOPSIES;  Surgeon: Leslye Peer, MD;  Location: [facility] ENDOSCOPY;  Service: Pulmonary;;   BRONCHIAL BIOPSY  09/29/2022   Procedure: BRONCHIAL BIOPSIES;  Surgeon: Leslye Peer, MD;  Location: [facility] ENDOSCOPY;  Service: Pulmonary;;   BRONCHIAL BRUSHINGS  12/27/2020   Procedure: BRONCHIAL BRUSHINGS;  Surgeon: Leslye Peer, MD;  Location: [facility] ENDOSCOPY;  Service: Pulmonary;;   BRONCHIAL BRUSHINGS  09/29/2022   Procedure: BRONCHIAL BRUSHINGS;  Surgeon: Leslye Peer, MD;  Location: [facility] ENDOSCOPY;  Service: Pulmonary;;   BRONCHIAL NEEDLE ASPIRATION BIOPSY  12/27/2020   Procedure: BRONCHIAL NEEDLE ASPIRATION BIOPSIES;  Surgeon: Leslye Peer, MD;  Location: [facility] ENDOSCOPY;  Service: Pulmonary;;   BRONCHIAL NEEDLE ASPIRATION BIOPSY  09/29/2022   Procedure: BRONCHIAL NEEDLE ASPIRATION BIOPSIES;  Surgeon: Leslye Peer, MD;  Location: [facility] ENDOSCOPY;  Service: Pulmonary;;   BRONCHIAL WASHINGS  12/27/2020   Procedure: BRONCHIAL WASHINGS;  Surgeon: Leslye Peer, MD;  Location: [facility] ENDOSCOPY;  Service: Pulmonary;;   BRONCHIAL WASHINGS  09/29/2022   Procedure: BRONCHIAL WASHINGS;  Surgeon: Leslye Peer, MD;  Location: [facility] ENDOSCOPY;  Service: Pulmonary;;   CATARACT EXTRACTION, BILATERAL     CORONARY CALCIUM SCORE-CT ANGIOGRAM  04/2019   Small, < 5mm nodule noted.  Difficult study to read.  Coronary calcium score was 11.  Significant motion artifact, but visible territory showed minimal disease.  Minimal calcium  noted in the ostial LAD but no suggestion of significant CAD.   EYE SURGERY     FIDUCIAL MARKER PLACEMENT  12/27/2020   Procedure: FIDUCIAL MARKER PLACEMENT;  Surgeon: Leslye Peer, MD;  Location: [facility] ENDOSCOPY;  Service: Pulmonary;;   FIDUCIAL MARKER PLACEMENT  09/29/2022   Procedure: FIDUCIAL MARKER PLACEMENT;  Surgeon: Leslye Peer, MD;  Location: [facility] ENDOSCOPY;  Service: Pulmonary;;   JOINT REPLACEMENT Bilateral    left knee 2012   KNEE ARTHROPLASTY  02/17/2012   Procedure: COMPUTER ASSISTED TOTAL KNEE ARTHROPLASTY;  Surgeon: Cammy Copa, MD;  Location: [facility];  Service: Orthopedics;  Laterality: Right;  Right total knee replacement   MASTECTOMY W/ SENTINEL NODE BIOPSY Left 11/26/2021   Procedure: LEFT BREAST MASTECTOMY;  Surgeon: Almond Lint, MD;  Location: [facility] OR;  Service: General;  Laterality: Left;   TRANSTHORACIC ECHOCARDIOGRAM  04/02/2020   Normal LV function.  No R WMA.  Moderate LVH.  GRII DD with elevated filling pressures.  Moderate LA dilation with trivial MR.  Mild aortic valve sclerosis.   VIDEO BRONCHOSCOPY WITH ENDOBRONCHIAL NAVIGATION N/A 12/27/2020   Procedure: VIDEO BRONCHOSCOPY WITH ENDOBRONCHIAL NAVIGATION;  Surgeon: Leslye Peer, MD;  Location: [facility] ENDOSCOPY;  Service: Pulmonary;  Laterality: N/A;   VIDEO BRONCHOSCOPY WITH RADIAL ENDOBRONCHIAL ULTRASOUND  09/29/2022   Procedure: VIDEO BRONCHOSCOPY WITH RADIAL ENDOBRONCHIAL ULTRASOUND;  Surgeon: Leslye Peer, MD;  Location: [facility] ENDOSCOPY;  Service: Pulmonary;;    Allergies  Allergies  Allergen Reactions   Amlodipine Other (See Comments)    Other reaction(s): swelling, ha, itching   Benicar [Olmesartan Medoxomil] Other (See Comments)    Unknown   Doxepin Hcl     Other reaction(s): Body swelling   Estradiol     Other reaction(s): itching   Gabapentin     Other reaction(s): swelling   Levofloxacin     Other reaction(s): Unknown   Lisinopril Itching    No energy Other reaction(s): itching, hair  loss   Nebivolol Hcl     Other reaction(s): fatigue   Olmesartan     Other reaction(s): hair loss, nail changes   Tramadol Hcl     Other reaction(s): abdominal pain   Penicillins Itching and Rash    Has patient had a PCN reaction causing immediate rash, facial/tongue/throat swelling, SOB or lightheadedness with hypotension: No Has patient had a PCN reaction causing severe rash involving mucus membranes or skin necrosis: No Has patient had a PCN reaction that required hospitalization: No Has patient had a PCN reaction occurring within the last 10 years: No If all of the above answers are "NO", then may proceed with Cephalosporin use.   Sulfa Antibiotics Itching and Rash    Other reaction(s): Unknown     Labs/Other Studies Reviewed    The following studies were reviewed today:  Cardiac Studies & Procedures       ECHOCARDIOGRAM  ECHOCARDIOGRAM COMPLETE 04/02/2020  Narrative ECHOCARDIOGRAM REPORT    Patient Name:   MICIAH MAGINNIS Date of Exam: 04/02/2020 Medical Rec #:  [id number]  Height:       64.5 in Accession #:    [id number]           Weight:       216.8 lb Date of Birth:  1935-03-31            BSA:          2.035 m Patient Age:    85 years             BP:           156/84 mmHg Patient Gender: F                    HR:           52 bpm. Exam Location:  [address]  Procedure: 2D Echo, Cardiac Doppler and Color Doppler  Indications:    R06.00  History:        Patient has no prior history of Echocardiogram examinations. Bradycardia, Signs/Symptoms:Shortness of Breath and Fatigue; Risk Factors:Hypertension.  Sonographer:    Samule Ohm RDCS Referring Phys: 75 DAVID W HARDING  IMPRESSIONS   1. Left ventricular ejection fraction, by estimation, is 60 to 65%. The left ventricle has normal function. The left ventricle has no regional wall motion abnormalities. There is moderate asymmetric left ventricular hypertrophy. Left ventricular diastolic  parameters are consistent with Grade II diastolic dysfunction (pseudonormalization). Elevated left ventricular end-diastolic pressure. 2. Right ventricular systolic function is normal. The right ventricular size is normal. There is normal pulmonary artery systolic pressure. The estimated right ventricular systolic pressure is 23.8 mmHg. 3. Left atrial size was moderately dilated. 4. The mitral valve is normal in structure. Trivial mitral valve regurgitation. No evidence of mitral stenosis. 5. The aortic valve is tricuspid. Aortic valve regurgitation is trivial. Mild aortic valve sclerosis is present, with no evidence of aortic valve stenosis. 6. The inferior vena cava is normal in size with greater than 50% respiratory variability, suggesting right atrial pressure of 3 mmHg.  FINDINGS Left Ventricle: Left ventricular ejection fraction, by estimation, is 60 to 65%. The left ventricle has normal function. The left ventricle has no regional wall motion abnormalities. The left ventricular internal cavity size was normal in size. There is moderate asymmetric left ventricular hypertrophy. Left ventricular diastolic parameters are consistent with Grade II diastolic dysfunction (pseudonormalization). Elevated left ventricular end-diastolic pressure.  Right Ventricle: The right ventricular size is normal. No increase in right ventricular wall thickness. Right ventricular systolic function is normal. There is normal pulmonary artery systolic pressure. The tricuspid regurgitant velocity is 2.28 m/s, and with an assumed right atrial pressure of 3 mmHg, the estimated right ventricular systolic pressure is 23.8 mmHg.  Left Atrium: Left atrial size was moderately dilated.  Right Atrium: Right atrial size was normal in size.  Pericardium: There is no evidence of pericardial effusion.  Mitral Valve: The mitral valve is normal in structure. Normal mobility of the mitral valve leaflets. Trivial mitral valve  regurgitation. No evidence of mitral valve stenosis.  Tricuspid Valve: The tricuspid valve is normal in structure. Tricuspid valve regurgitation is trivial. No evidence of tricuspid stenosis.  Aortic Valve: The aortic valve is tricuspid. Aortic valve regurgitation is trivial. Mild aortic valve sclerosis is present, with no evidence of aortic valve stenosis. Aortic valve mean gradient measures 7.7 mmHg. Aortic valve peak gradient measures 15.9 mmHg. Aortic valve area, by VTI measures 2.08 cm.  Pulmonic Valve: The pulmonic valve was normal in structure. Pulmonic valve regurgitation is not visualized.  No evidence of pulmonic stenosis.  Aorta: The aortic root is normal in size and structure.  Venous: The inferior vena cava is normal in size with greater than 50% respiratory variability, suggesting right atrial pressure of 3 mmHg.  IAS/Shunts: No atrial level shunt detected by color flow Doppler.   LEFT VENTRICLE PLAX 2D LVIDd:         5.10 cm  Diastology LVIDs:         2.50 cm  LV e' lateral:   6.96 cm/s LV PW:         1.10 cm  LV E/e' lateral: 13.2 LV IVS:        1.30 cm  LV e' medial:    6.85 cm/s LVOT diam:     2.00 cm  LV E/e' medial:  13.5 LV SV:         96 LV SV Index:   47 LVOT Area:     3.14 cm   RIGHT VENTRICLE             IVC RV S prime:     12.40 cm/s  IVC diam: 1.30 cm TAPSE (M-mode): 2.1 cm RVSP:           23.8 mmHg  LEFT ATRIUM              Index       RIGHT ATRIUM           Index LA diam:        3.50 cm  1.72 cm/m  RA Pressure: 3.00 mmHg LA Vol (A2C):   115.0 ml 56.50 ml/m RA Area:     17.60 cm LA Vol (A4C):   66.0 ml  32.43 ml/m RA Volume:   46.10 ml  22.65 ml/m LA Biplane Vol: 90.0 ml  44.22 ml/m AORTIC VALVE AV Area (Vmax):    2.16 cm AV Area (Vmean):   2.16 cm AV Area (VTI):     2.08 cm AV Vmax:           199.67 cm/s AV Vmean:          127.667 cm/s AV VTI:            0.462 m AV Peak Grad:      15.9 mmHg AV Mean Grad:      7.7 mmHg LVOT Vmax:          137.00 cm/s LVOT Vmean:        87.800 cm/s LVOT VTI:          0.305 m LVOT/AV VTI ratio: 0.66  AORTA Ao Root diam: 3.50 cm Ao Asc diam:  3.40 cm  MV E velocity: 92.20 cm/s  TRICUSPID VALVE MV A velocity: 87.80 cm/s  TR Peak grad:   20.8 mmHg MV E/A ratio:  1.05        TR Vmax:        228.00 cm/s Estimated RAP:  3.00 mmHg RVSP:           23.8 mmHg  SHUNTS Systemic VTI:  0.30 m Systemic Diam: 2.00 cm  Armanda Magic MD Electronically signed by Armanda Magic MD Signature Date/Time: 04/02/2020/5:13:40 PM    Final    MONITORS  LONG TERM MONITOR (3-14 DAYS) 05/04/2019  Narrative  Predominant rhythm is sinus with a minimum heart rate of 36 bpm, maximum heart rate 88 bpm.  7 runs of PAT/PSVT noted 6-8 beats. Fastest heart rate 162 bpm.  Rare PACs with minimal PVCs noted.  Patient noted lightheadedness  with rates ranging from 49 to 61 bpm in sinus rhythm/sinus bradycardia.  Slowest heart rate noted 36 bpm at 5:40 PM. 38 bpm at 11:36 AM  Based on these findings, would appear that the slowest heart rate episodes were all noted while she was still taking diltiazem according to plan.  Further bradycardia episodes were not recorded once she was no longer taking diltiazem  Although there were SVT/PAT runs, would recommend continue to hold AV nodal agents to avoid worsening bradycardia.  Bryan Lemma, MD   CT SCANS  CT CORONARY MORPH W/CTA COR W/SCORE 05/12/2019  Addendum 05/12/2019  5:00 PM ADDENDUM REPORT: 05/12/2019 16:58  EXAM: OVER-READ INTERPRETATION  CT CHEST  The following report is an over-read performed by radiologist Dr. Noe Gens [facility] on 05/12/2019. This over-read does not include interpretation of cardiac or coronary anatomy or pathology. The coronary CTA interpretation by the cardiologist is attached.  COMPARISON:  03/08/2019  FINDINGS: Heart is borderline in size. Visualized aorta normal caliber. No adenopathy in the lower  mediastinum or hila. Small nodules peripherally in the right upper lobe measuring 4 mm, stable. Right middle lobe nodule measures 5 mm and is stable. No confluent opacities or effusions. Small hiatal hernia. Imaging into the upper abdomen shows no acute findings. Chest wall soft tissues are unremarkable.  IMPRESSION: Small pulmonary nodules, 5 mm or less in size, stable. No follow-up needed if patient is low-risk (and has no known or suspected primary neoplasm). Non-contrast chest CT can be considered in 12 months if patient is high-risk. This recommendation follows the consensus statement: Guidelines for Management of Incidental Pulmonary Nodules Detected on CT Images: From the Fleischner Society 2017; Radiology 2017; 284:228-243.   Electronically Signed By: Charlett Nose M.D. On: 05/12/2019 16:58  Narrative CLINICAL DATA:  Chest pain  EXAM: Cardiac CTA  MEDICATIONS: Sub lingual nitro.  4mg   : The patient was scanned on a Siemens Force 192 slice scanner. Gantry rotation speed was 250 msecs. Collimation was .6 mm. A 100 kV prospective scan was triggered in the ascending thoracic aorta at 140 HU's Full mA was used between 35% and 75% of the R-R interval. Average HR during the scan was 48 bpm. The 3D data set was interpreted on a dedicated work station using MPR, MIP and VRT modes. A total of 80 cc of contrast was used.  FINDINGS: Non-cardiac: See separate report from [facility]. No significant findings on limited lung and soft tissue windows.  Calcium Score: Only one small area of ostial LAD calcium noted  Coronary Arteries: Right dominant with no anomalies  LM: Normal  LAD: 1-24% ostial calcified stenosis. Portion of distal LAD not well visualized  D1: Normal  D2: Normal  IM: Normal  Circumflex: Normal  OM1: Normal  OM2: Normal  RCA: Portion of the ostial/ proximal RCA hard to visualize due to motion artifact otherwise normal  PDA:  Normal  PLA: Normal  IMPRESSION: 1.  Calcium score only 11 which is 28 th percentile for age and sex  2.  Normal aortic root 3.1 cm  3. Non diagnostic study do to severe motion artifact. Portion of the ostial RCA and distal LAD not well visualized. However study would be considered low risk due to minimum calcium (only on non obstructive foci in ostial LAD. And no disease in remainder of visualized segments  Charlton Haws  Electronically Signed: By: Charlton Haws M.D. On: 05/12/2019 16:11         Recent Labs: 09/01/2022:  ALT 10; BUN 21; Creatinine, Ser 0.88; Hemoglobin 10.7; Platelets 282; Potassium 3.6; Sodium 140  Recent Lipid Panel No results found for: "CHOL", "TRIG", "HDL", "CHOLHDL", "VLDL", "LDLCALC", "LDLDIRECT"  History of Present Illness    88 year old female with the above past medical history including chronic diastolic heart failure, bradycardia, hypertension, aortic atherosclerosis, hypothyroidism, breast cancer, and lung cancer.    Coronary CTA in 2020 showed calcium score of 11 (28th percentile).  Echocardiogram in 03/2020 showed EF 60 to 65%, normal LV function, no RWMA, G2 DD, elevated LVEDP, normal LV systolic function, no significant valvular abnormalities. At her follow-up visit in 04/2022 she noted ongoing fatigue that was thought to be multifactorial in the setting of physical deconditioning, obesity, bradycardia, vitamin D deficiency, and breast cancer.  It was noted that should she have ongoing fatigue she may benefit from possible sleep study as she did have some symptoms consistent with sleep apnea. She was last seen in the office on 11/05/2022 and was stable from a cardiac standpoint.  She noted significant improvement in her energy levels with prescription vitamin D.    She presents today for follow-up.  Since her last visit she has been stable overall from a cardiac standpoint. She does note recent increase in fatigue, mildly decreased activity tolerance.   She denies any chest pain, dyspnea, dizziness, palpitations, edema, PND, orthopnea, weight gain.  BP has been stable. She states she saw her PCP and labs were overall unremarkable including TSH, vitamin D, CBC, and BMET.  She questions if stress could be contributing to her symptoms, as her sister died just 2 weeks ago.  Other than her recent fatigue, she reports feeling well.   Home Medications    Current Outpatient Medications  Medication Sig Dispense Refill   amLODipine (NORVASC) 10 MG tablet TAKE ONE TABLET BY MOUTH AT NOON Needs appointment for further refills 90 tablet 1   Artificial Tear Solution (SOOTHE XP OP) Place 2 drops into both eyes 2 (two) times daily.     cetaphil (CETAPHIL) cream Apply 1 application  topically daily as needed (dry skin).     chlorthalidone (HYGROTON) 25 MG tablet TAKE ONE TABLET BY MOUTH AT NOON 90 tablet 1   clindamycin (CLEOCIN) 300 MG capsule Take 600 mg by mouth See admin instructions. Take before dental appointments     fluticasone (FLONASE) 50 MCG/ACT nasal spray Place 2 sprays into both nostrils daily as needed for allergies.     furosemide (LASIX) 20 MG tablet Take 20 mg by mouth daily as needed for fluid or edema.     levothyroxine (SYNTHROID, LEVOTHROID) 88 MCG tablet Take 88 mcg by mouth daily before breakfast.      meclizine (ANTIVERT) 12.5 MG tablet Take 12.5 mg by mouth 3 (three) times daily as needed for dizziness.     Multiple Vitamins-Minerals (PRESERVISION AREDS 2+MULTI VIT) CAPS Take 1 capsule by mouth in the morning and at bedtime.     potassium chloride SA (KLOR-CON) 20 MEQ tablet Take 20 mEq by mouth daily as needed (when taking furosemide).     Probiotic Product (ALIGN) 4 MG CAPS Take 2 capsules by mouth daily. gummy     valsartan (DIOVAN) 320 MG tablet Take 1 tablet (320 mg total) by mouth daily at 12 noon. Will need an appointment to for future refill 30 tablet 0   No current facility-administered medications for this visit.     Review  of Systems    She denies chest pain, palpitations, dyspnea, pnd,  orthopnea, n, v, dizziness, syncope, edema, weight gain, or early satiety. All other systems reviewed and are otherwise negative except as noted above.   Physical Exam    VS:  BP (!) 112/58   Pulse (!) 55   Ht 5\' 4"  (1.626 m)   Wt 196 lb (88.9 kg)   SpO2 99%   BMI 33.64 kg/m   GEN: Well nourished, well developed, in no acute distress. HEENT: normal. Neck: Supple, no JVD, carotid bruits, or masses. Cardiac: RRR, no murmurs, rubs, or gallops. No clubbing, cyanosis, edema.  Radials/DP/PT 2+ and equal bilaterally.  Respiratory:  Respirations regular and unlabored, clear to auscultation bilaterally. GI: Soft, nontender, nondistended, BS + x 4. MS: no deformity or atrophy. Skin: warm and dry, no rash. Neuro:  Strength and sensation are intact. Psych: Normal affect.  Accessory Clinical Findings    ECG personally reviewed by me today -    - no EKG in office today.   Lab Results  Component Value Date   WBC 8.3 09/01/2022   HGB 10.7 (L) 09/01/2022   HCT 32.1 (L) 09/01/2022   MCV 93.9 09/01/2022   PLT 282 09/01/2022   Lab Results  Component Value Date   CREATININE 0.88 09/01/2022   BUN 21 09/01/2022   NA 140 09/01/2022   K 3.6 09/01/2022   CL 105 09/01/2022   CO2 27 09/01/2022   Lab Results  Component Value Date   ALT 10 09/01/2022   AST 16 09/01/2022   ALKPHOS 61 09/01/2022   BILITOT 0.3 09/01/2022   No results found for: "CHOL", "HDL", "LDLCALC", "LDLDIRECT", "TRIG", "CHOLHDL"  Lab Results  Component Value Date   HGBA1C 5.3 11/22/2021    Assessment & Plan    1. Chronic diastolic heart failure: Echo in 03/2020 showed EF 60 to 65%, normal LV function, no RWMA, G2 DD, elevated LVEDP, normal RV systolic function, no significant valvular abnormalities. Euvolemic and well compensated on exam.  Continue valsartan, chlorthalidone, Lasix as needed.   2. Hypertension: BP well controlled. Continue current  antihypertensive regimen.    3. Bradycardia/LBBB: HR 55 bpm in office today. Denies symptoms concerning for angina. She is not on any nodal blocking agents. Continue to monitor.   4. Fatigue: Initially improved with vitamin D supplementation.  It was previously noted that she might benefit from sleep study, however, she declines today.  Fatigue has worsened over the past 2 weeks.  She notes somewhat decreased activity tolerance.  She denies chest pain or shortness of breath.  Recent labs per PCP including CBC, BMET, TSH, and vitamin D were unremarkable.  She reports a significant amount of stress as her sister died 2 weeks ago.  She wonders if stress is contributing to her fatigue.  If symptoms persist or worsen, consider need for possible ischemic evaluation.   5. Disposition: Follow-up in 3 months, sooner if needed.       Joylene Grapes, NP 05/25/2023, 11:26 AM

## 2023-06-15 DIAGNOSIS — H402231 Chronic angle-closure glaucoma, bilateral, mild stage: Secondary | ICD-10-CM | POA: Diagnosis not present

## 2023-06-15 DIAGNOSIS — H40053 Ocular hypertension, bilateral: Secondary | ICD-10-CM | POA: Diagnosis not present

## 2023-06-17 ENCOUNTER — Ambulatory Visit (INDEPENDENT_AMBULATORY_CARE_PROVIDER_SITE_OTHER): Payer: Medicare HMO | Admitting: Podiatry

## 2023-06-17 DIAGNOSIS — Z5320 Procedure and treatment not carried out because of patient's decision for unspecified reasons: Secondary | ICD-10-CM

## 2023-06-17 NOTE — Progress Notes (Signed)
Did not wait for treatment  Wait was too long, as doctor was running behind.  Patient left.

## 2023-06-22 IMAGING — US US BREAST BX W LOC DEV 1ST LESION IMG BX SPEC US GUIDE*L*
1 series · 10 of 10 positions shown · non-contrast
Comparison: Previous exam(s).
COMPARISON: Previous exam(s).

Addendum:
CLINICAL DATA: 86-year-old female with a suspicious left breast
mass.

EXAM:
ULTRASOUND GUIDED LEFT BREAST CORE NEEDLE BIOPSY

[Series 1: us breast bx w loc dev 1st lesion img bx spec us g · 0.07mm/px · 10 of 10 slices shown]
[im 1/10]
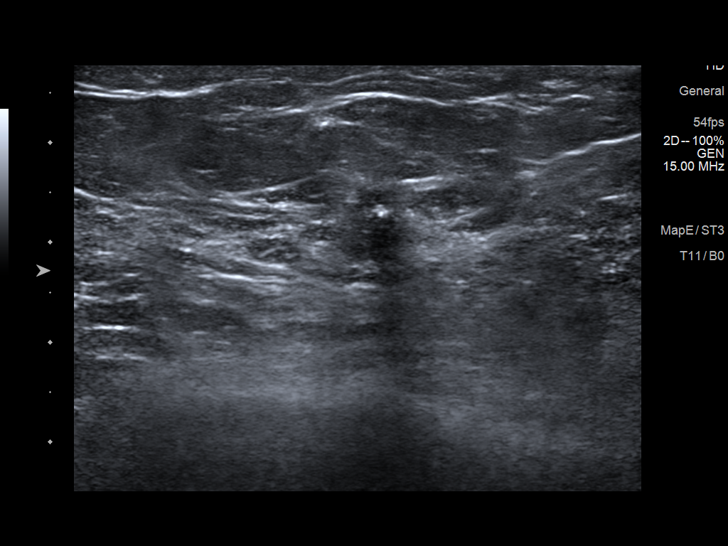
[im 2/10]
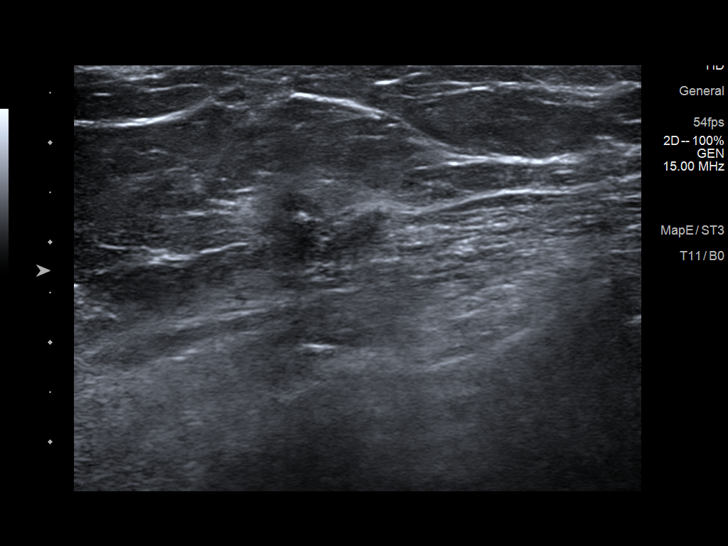
[im 3/10]
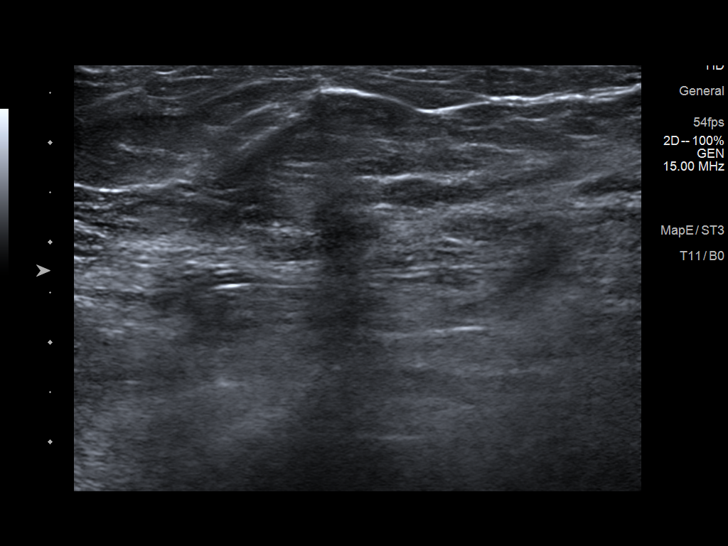
[im 4/10]
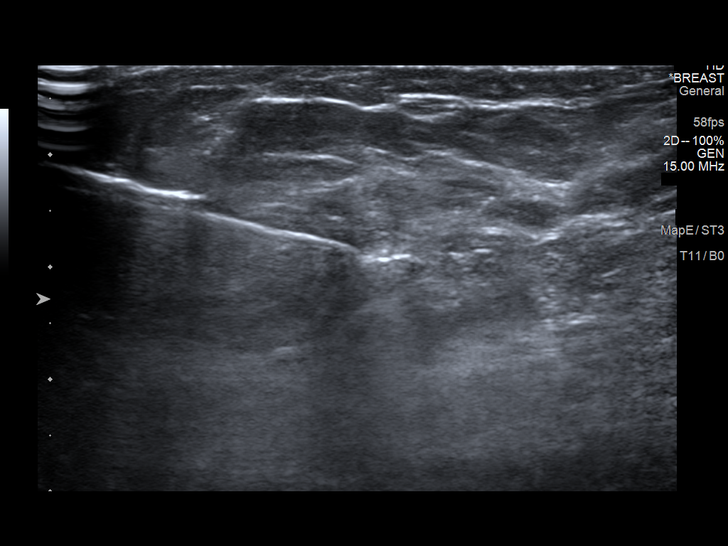
[im 5/10]
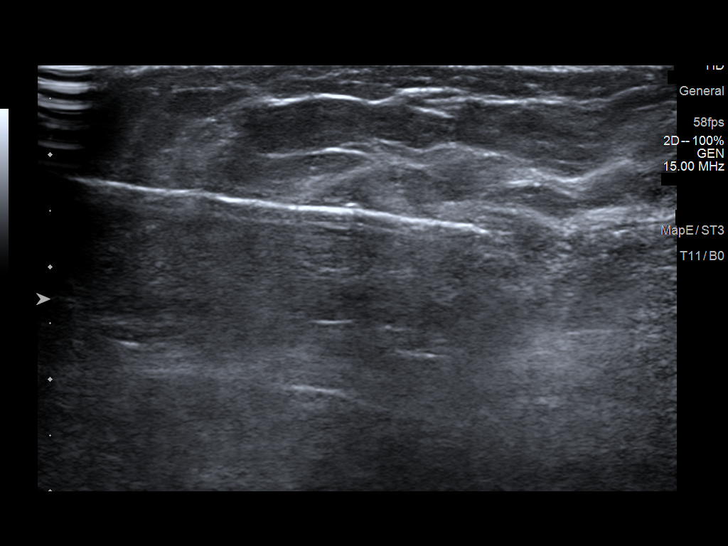
[im 6/10]
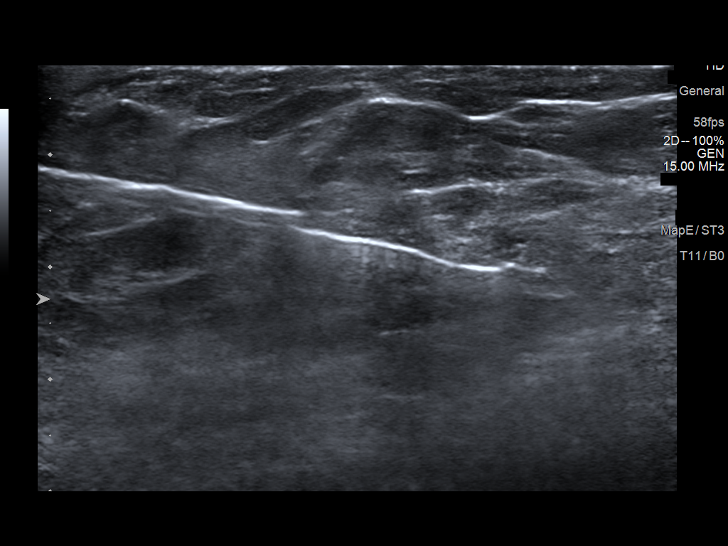
[im 7/10]
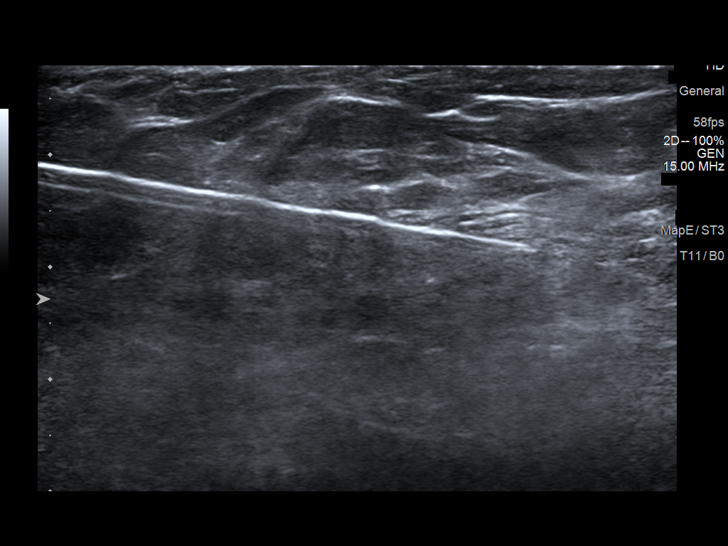
[im 8/10]
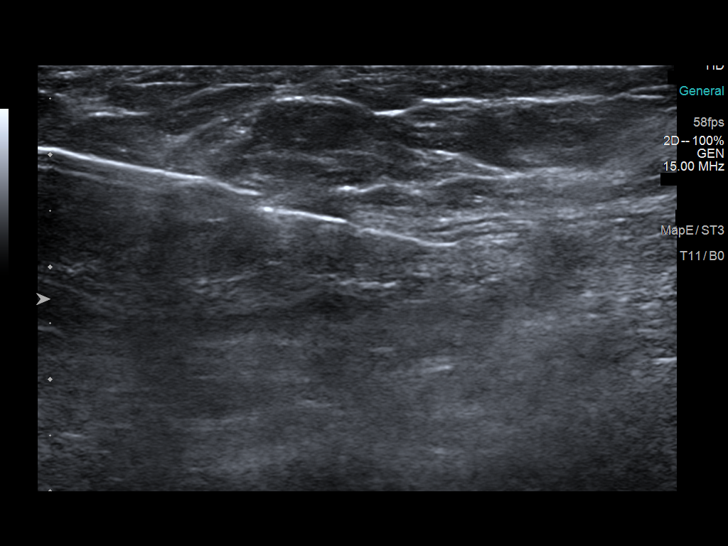
[im 9/10]
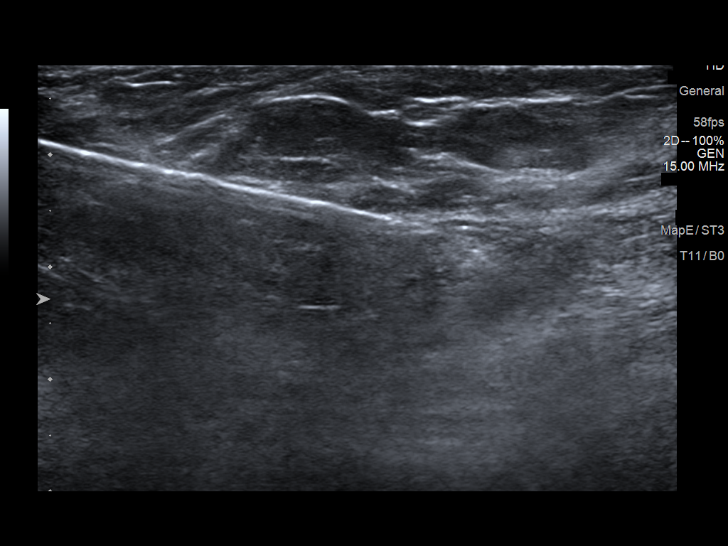
[im 10/10]
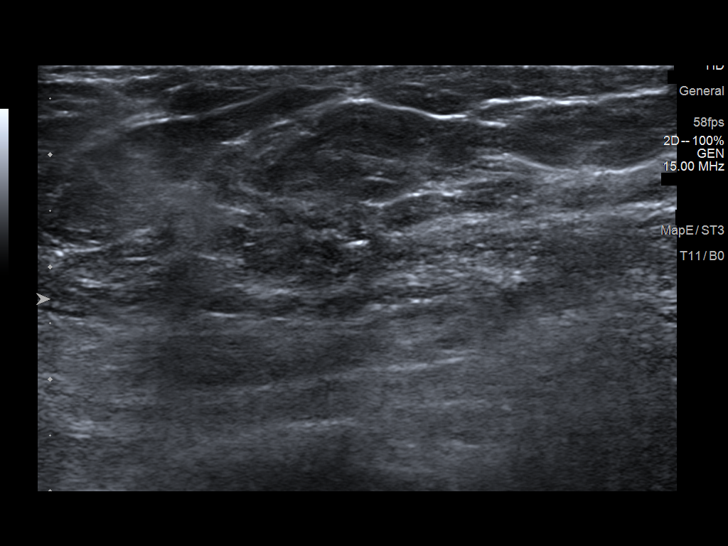

[10 of 10 positions shown; findings below may reference images not displayed]



Lesion quadrant: Upper inner quadrant

Using sterile technique and 1% Lidocaine as local anesthetic, under
direct ultrasound visualization, a 14 gauge Ayagle device was
used to perform biopsy of a mass at the 10 o'clock position using a
inferior approach. At the conclusion of the procedure a ribbon
shaped tissue marker clip was deployed into the biopsy cavity.
Follow up 2 view mammogram was performed and dictated separately.
IMPRESSION: Ultrasound guided biopsy of the left breast. No apparent
complications.

ADDENDUM:
Pathology revealed GRADE II INVASIVE DUCTAL CARCINOMA of the LEFT
breast, 10 o'clock, 8cmfn (ribbon clip). This was found to be
concordant by Dr. Yusara Omireji.

Pathology results were discussed with the patient by telephone. The
patient reported doing well after the biopsy with tenderness at the
site. Post biopsy instructions and care were reviewed and questions
were answered. The patient was encouraged to call The [REDACTED] for any additional concerns. My direct phone
number was provided.

Surgical consultation has been arranged with Dr. Dinis Tiger at
[REDACTED] on November 05, 2021.

Medical oncology consultation has been arranged with Dr. Daveiga
Jarryd at [HOSPITAL] [HOSPITAL] on November 11, 2021.

Dr. Jarryd was notified of biopsy results via [REDACTED] message on [facility]

Pathology results reported by Mohamed Princes, RN on 10/31/2021.



Lesion quadrant: Upper inner quadrant

Using sterile technique and 1% Lidocaine as local anesthetic, under
direct ultrasound visualization, a 14 gauge Ayagle device was
used to perform biopsy of a mass at the 10 o'clock position using a
inferior approach. At the conclusion of the procedure a ribbon
shaped tissue marker clip was deployed into the biopsy cavity.
Follow up 2 view mammogram was performed and dictated separately.
IMPRESSION: Ultrasound guided biopsy of the left breast. No apparent
complications.

## 2023-06-22 IMAGING — MG MM BREAST LOCALIZATION CLIP
4 series · 4 of 12 positions shown · non-contrast
Comparison: Previous exam(s).

CLINICAL DATA: Status post ultrasound-guided biopsy of the left
breast.

EXAM:
3D DIAGNOSTIC LEFT MAMMOGRAM POST ULTRASOUND BIOPSY

[L ML synth-2D]
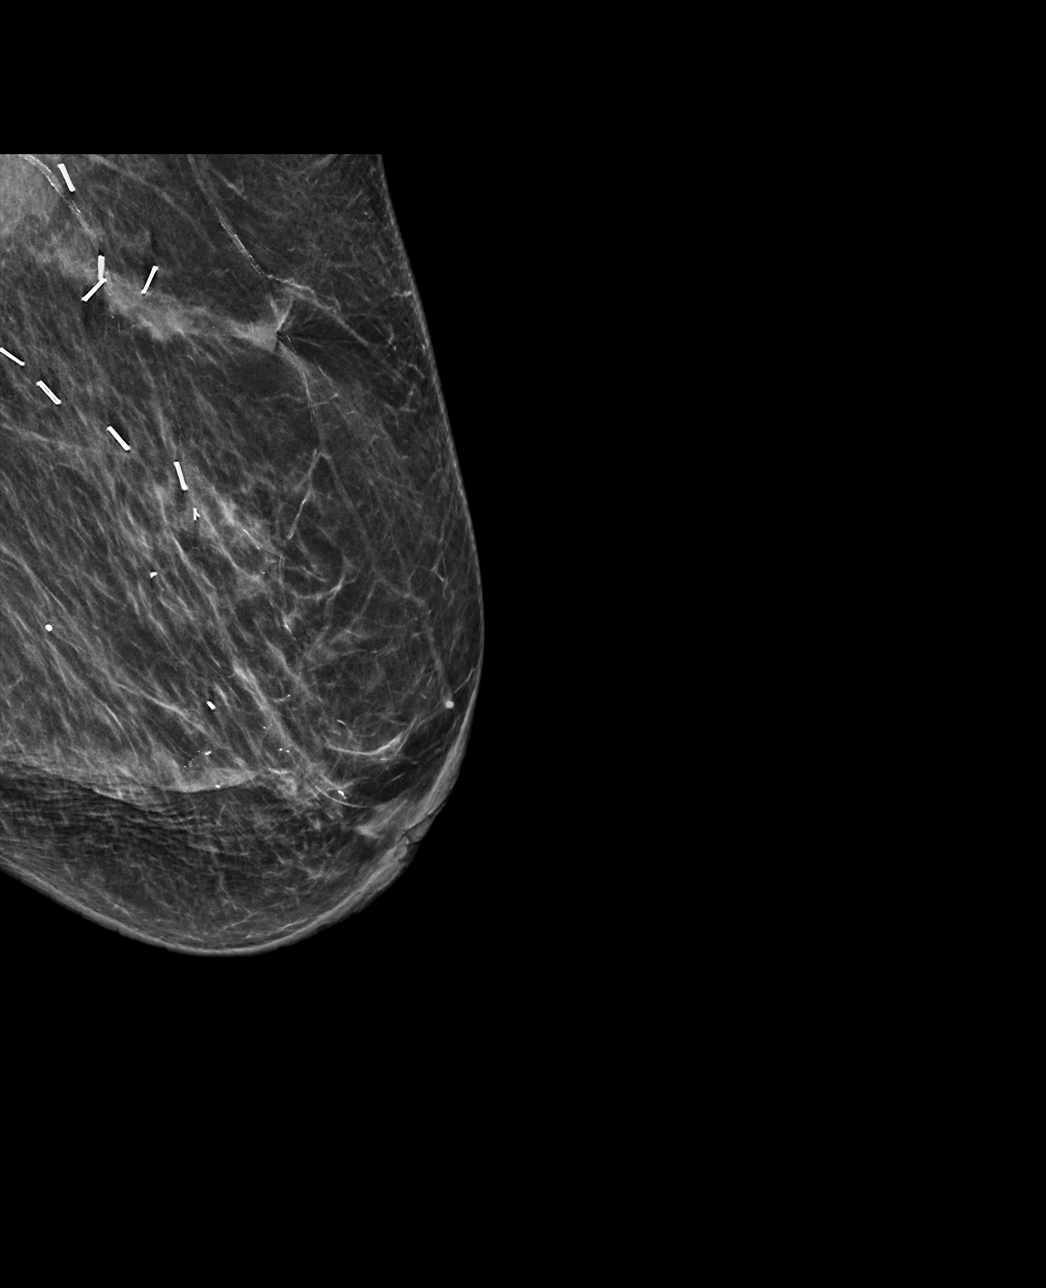

[L CC synth-2D]
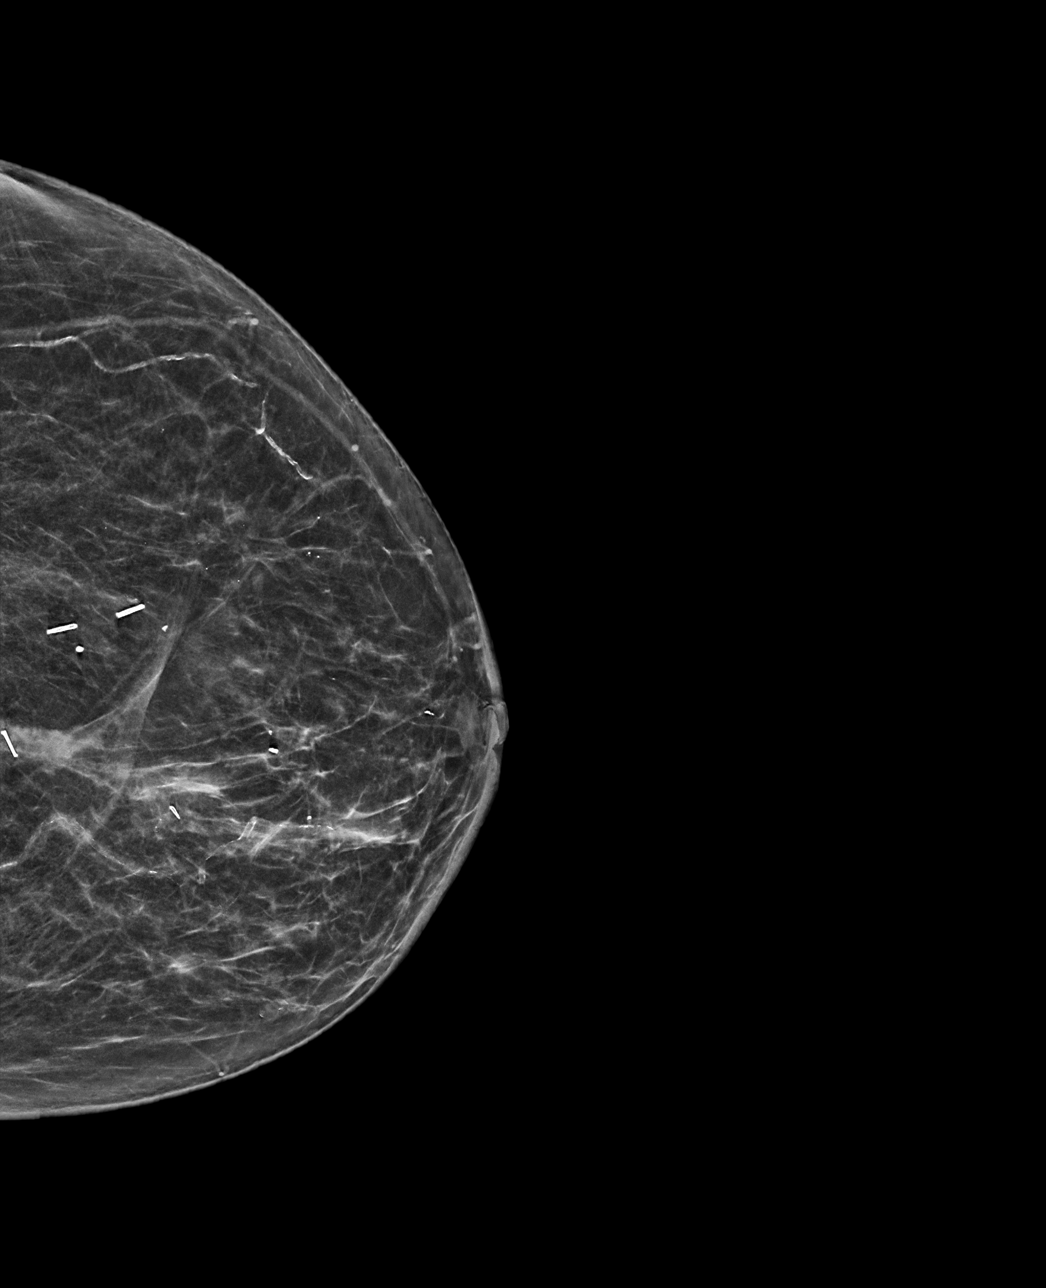

[L ML tomo · tomo slice 33/66.0]
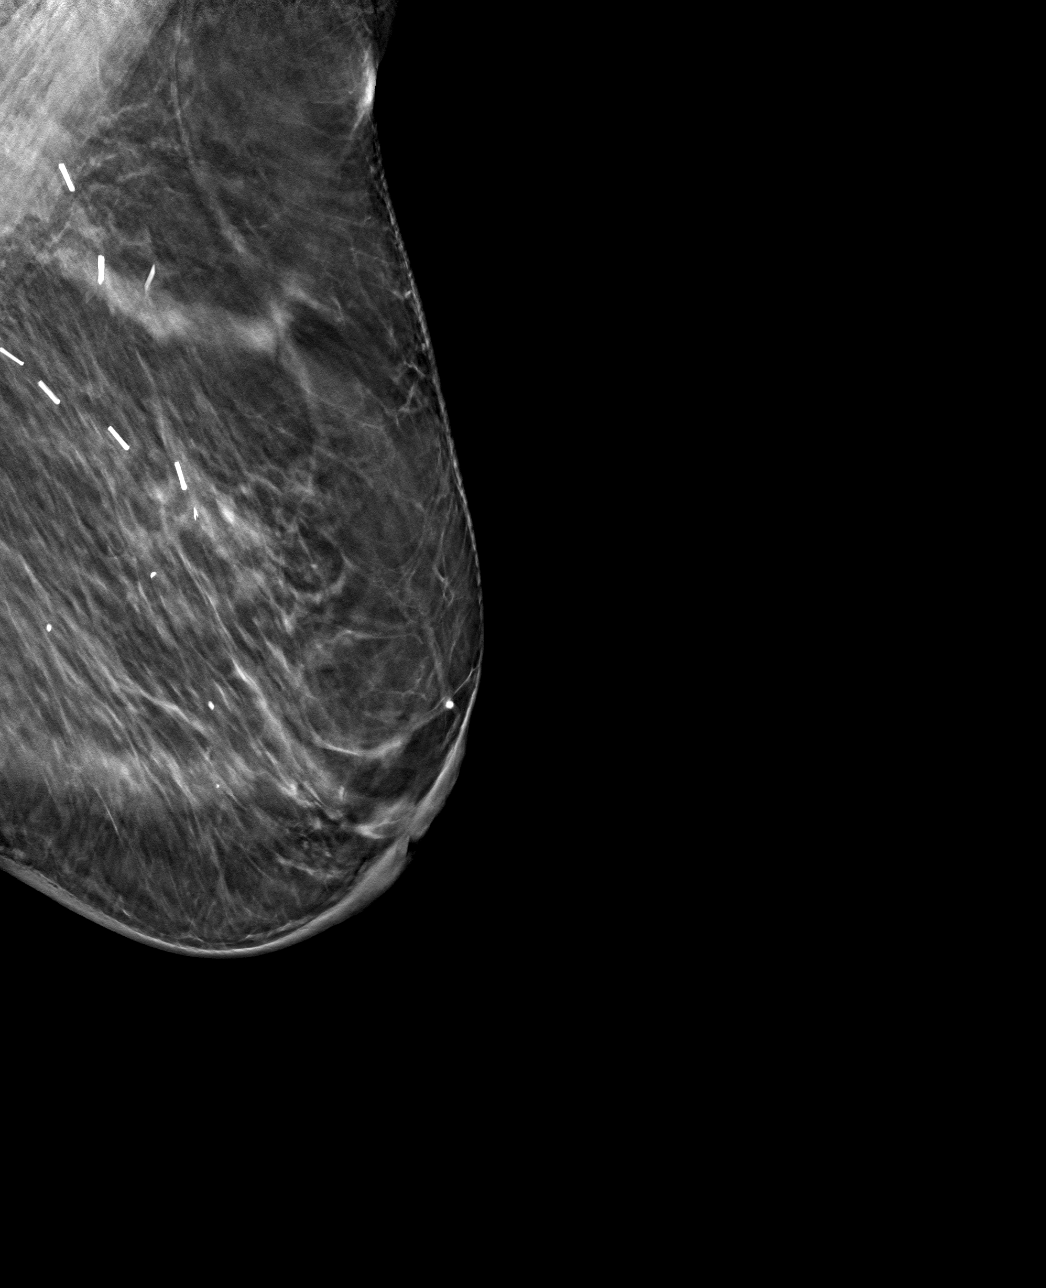

[L CC tomo · tomo slice 29/57.0]
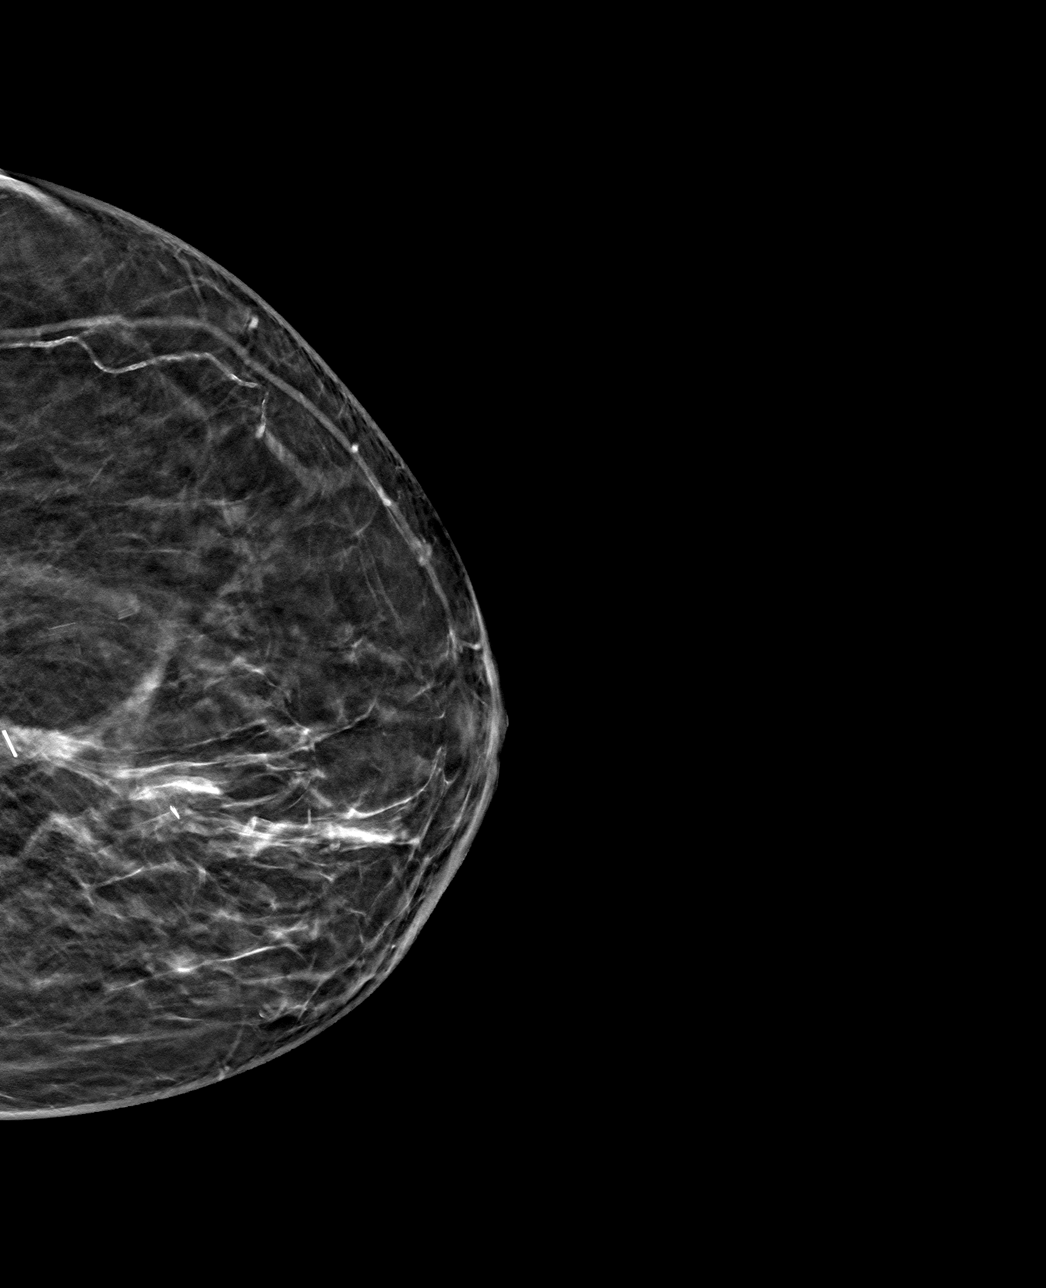

[4 of 12 positions shown; findings below may reference images not displayed]

FINDINGS: 3D Mammographic images were obtained following ultrasound guided
biopsy of the left breast. The biopsy marking clip is in expected
position at the site of biopsy.
IMPRESSION: Appropriate positioning of the ribbon shaped biopsy marking clip at
the site of biopsy in the upper inner left breast.

Final Assessment: Post Procedure Mammograms for Marker Placement

## 2023-07-07 ENCOUNTER — Telehealth: Payer: Self-pay | Admitting: *Deleted

## 2023-07-07 NOTE — Telephone Encounter (Signed)
CALLED PATIENT TO INFORM OF CT FOR 07-29-23- ARRIVAL TIME- 11:45 AM @ [facility], NO RESTRICTIONS TO SCAN, PATIENT TO RECEIVE RESULTS FROM DR. SQUIRE ON 08-04-23 @ 2:20 PM, SPOKE WITH PATIENT AND SHE IS AWARE OF THESE APPTS.AND THE INSTRUCTIONS

## 2023-07-13 DIAGNOSIS — M79672 Pain in left foot: Secondary | ICD-10-CM | POA: Diagnosis not present

## 2023-07-13 DIAGNOSIS — L03032 Cellulitis of left toe: Secondary | ICD-10-CM | POA: Diagnosis not present

## 2023-07-13 DIAGNOSIS — L03031 Cellulitis of right toe: Secondary | ICD-10-CM | POA: Diagnosis not present

## 2023-07-13 DIAGNOSIS — L600 Ingrowing nail: Secondary | ICD-10-CM | POA: Diagnosis not present

## 2023-07-13 DIAGNOSIS — M79671 Pain in right foot: Secondary | ICD-10-CM | POA: Diagnosis not present

## 2023-07-13 DIAGNOSIS — B351 Tinea unguium: Secondary | ICD-10-CM | POA: Diagnosis not present

## 2023-07-21 NOTE — Progress Notes (Signed)
Alisha [name] presents today for follow up for completion of radiation for lung cancer and to receive CT scan results from 07-29-23.    PAIN: Pt denies pain  RESPIRATORY: None. Pt is on room air. Noted  Dryness and Pruritus.  SWALLOWING/DIET: Pt denies dysphagia. The patient eats a regular, healthy diet..  OTHER: Pt complains of fatigue. No major concerns or questions.   Vitals:   08/04/23 1355  BP: (!) 155/58  Pulse: 61  Resp: 18  Temp: (!) 97.3 F (36.3 C)  SpO2: 100%   Wt Readings from Last 3 Encounters:  08/04/23 198 lb 2 oz (89.9 kg)  05/25/23 196 lb (88.9 kg)  03/26/23 200 lb (90.7 kg)      Wt Readings from Last 3 Encounters:  05/25/23 196 lb (88.9 kg)  03/26/23 200 lb (90.7 kg)  01/27/23 198 lb (89.8 kg)

## 2023-07-29 ENCOUNTER — Ambulatory Visit (HOSPITAL_COMMUNITY)
Admission: RE | Admit: 2023-07-29 | Discharge: 2023-07-29 | Disposition: A | Discharge status: Home / Self Care | Payer: Medicare HMO | Source: Ambulatory Visit | Attending: Radiation Oncology | Admitting: Radiation Oncology

## 2023-07-29 ENCOUNTER — Encounter (HOSPITAL_COMMUNITY): Payer: Self-pay

## 2023-07-29 DIAGNOSIS — C3490 Malignant neoplasm of unspecified part of unspecified bronchus or lung: Secondary | ICD-10-CM | POA: Diagnosis not present

## 2023-07-29 DIAGNOSIS — I700 Atherosclerosis of aorta: Secondary | ICD-10-CM | POA: Diagnosis not present

## 2023-07-29 DIAGNOSIS — C3411 Malignant neoplasm of upper lobe, right bronchus or lung: Secondary | ICD-10-CM | POA: Diagnosis not present

## 2023-08-04 ENCOUNTER — Ambulatory Visit
Admission: RE | Admit: 2023-08-04 | Discharge: 2023-08-04 | Disposition: A | Discharge status: Home / Self Care | Payer: Medicare HMO | Source: Ambulatory Visit | Attending: Radiation Oncology | Admitting: Radiation Oncology

## 2023-08-04 ENCOUNTER — Encounter: Payer: Self-pay | Admitting: Radiation Oncology

## 2023-08-04 VITALS — BP 155/58 | HR 61 | Temp 97.3°F | Resp 18 | Ht 64.0 in | Wt 198.1 lb

## 2023-08-04 DIAGNOSIS — I700 Atherosclerosis of aorta: Secondary | ICD-10-CM | POA: Insufficient documentation

## 2023-08-04 DIAGNOSIS — I2510 Atherosclerotic heart disease of native coronary artery without angina pectoris: Secondary | ICD-10-CM | POA: Insufficient documentation

## 2023-08-04 DIAGNOSIS — Z79890 Hormone replacement therapy (postmenopausal): Secondary | ICD-10-CM | POA: Diagnosis not present

## 2023-08-04 DIAGNOSIS — C3411 Malignant neoplasm of upper lobe, right bronchus or lung: Secondary | ICD-10-CM | POA: Insufficient documentation

## 2023-08-04 DIAGNOSIS — C50412 Malignant neoplasm of upper-outer quadrant of left female breast: Secondary | ICD-10-CM | POA: Diagnosis not present

## 2023-08-04 DIAGNOSIS — Z923 Personal history of irradiation: Secondary | ICD-10-CM | POA: Diagnosis not present

## 2023-08-04 DIAGNOSIS — C342 Malignant neoplasm of middle lobe, bronchus or lung: Secondary | ICD-10-CM | POA: Diagnosis not present

## 2023-08-04 DIAGNOSIS — Z171 Estrogen receptor negative status [ER-]: Secondary | ICD-10-CM | POA: Insufficient documentation

## 2023-08-04 DIAGNOSIS — Z79899 Other long term (current) drug therapy: Secondary | ICD-10-CM | POA: Insufficient documentation

## 2023-08-04 NOTE — Progress Notes (Signed)
Radiation Oncology         ([phone]) [phone] ________________________________  Name: Alisha [name] MRN: [id number]  Date: 08/04/2023  DOB: [birth year]  Follow-Up Visit Note  CC: Farris Has, MD  Farris Has, MD  Diagnosis and Prior Radiotherapy:       ICD-10-CM   1. Malignant neoplasm of upper lobe of right lung [facility])  C34.11 CT Chest Wo Contrast      Cancer Staging  Cancer of middle lobe of lung [facility]) Staging form: Lung, AJCC 8th Edition - Clinical stage from 10/07/2022: Stage IA1 (cT1a, cN0, cM0) - Signed by Lonie Peak, MD on 10/07/2022 Stage prefix: Initial diagnosis  Malignant neoplasm of upper lobe of right lung [facility]) Staging form: Lung, AJCC 8th Edition - Clinical stage from 01/23/2021: cT1, cN0, cM0 - Signed by Lonie Peak, MD on 01/23/2021 Stage prefix: Initial diagnosis Laterality: Right  Malignant neoplasm of upper-inner quadrant of left breast in female, estrogen receptor negative (HCC) Staging form: Breast, AJCC 8th Edition - Clinical stage from 10/29/2021: Stage IB (cT1c, cN0, cM0, G2, ER-, PR-, HER2-) - Signed by Loa Socks, NP on 11/06/2021 Stage prefix: Initial diagnosis Histologic grading system: 3 grade system  Malignant neoplasm of upper-outer quadrant of left breast in female, estrogen receptor negative (HCC) Staging form: Breast, AJCC 8th Edition - Clinical stage from 06/22/2019: Stage IB (cT1b, cN0, cM0, G3, ER-, PR-, HER2-) - Signed by Loa Socks, NP on 06/29/2019 Stage prefix: Initial diagnosis Histologic grading system: 3 grade system - Pathologic: Stage IB (pT1b, pN0, cM0, G3, ER-, PR-, HER2-) - Signed by Loa Socks, NP on 08/26/2020 Stage prefix: Initial diagnosis Histologic grading system: 3 grade system    CHIEF COMPLAINT:  Here for follow-up and surveillance of lung cancer and to receive CT scan results from 01/26/23.   First Treatment Date: 2022-10-21 - Last Treatment Date: 2022-10-31 Plan Name:  Lung_RUL_SBRT Site: Lung, Right Technique: SBRT/SRT-IMRT Mode: Photon Dose Per Fraction: 11 Gy Prescribed Dose (Delivered / Prescribed): 55 Gy / 55 Gy Prescribed Fxs (Delivered / Prescribed): 5 / 5   Plan Name: Lung_RML_SBRT Site: Lung, Right Technique: SBRT/SRT-IMRT Mode: Photon Dose Per Fraction: 11 Gy Prescribed Dose (Delivered / Prescribed): 55 Gy / 55 Gy Prescribed Fxs (Delivered / Prescribed): 5 / 5   Radiation Treatment Dates: 02/13/2021 through 02/20/2021 Site Technique Total Dose (Gy) Dose per Fx (Gy) Completed Fx Beam Energies  Lung, Right: Lung_Rt_RUL IMRT 54/54 18 3/3 6XFFF    Radiation Treatment Dates: 09/07/2019 through 10/06/2019 Site Technique Total Dose (Gy) Dose per Fx (Gy) Completed Fx Beam Energies  Breast, Left: Breast_Lt 3D 40.05/40.05 2.67 15/15 6X, 10X  Breast, Left: Breast_Lt_Bst specialPort 10/10 2 5/5 15E    Narrative:  The patient returns today for routine follow-up.  Recent CT shows evolving changes of radiation therapy in the apical and posterior segments of the right upper lobe. The previously discussed 6 mm anterior segment in the right upper lobe nodule is now obscured by evolving radiation changes. Scattered small pulmonary nodules appear to remain stable.   Patient states to be doing well overall today. She denies any shortness of breath, cough, hemoptysis or chest pain. She endorses some on-going fatigue along with a decreased appetite. She also has noticed some dry skin on her upper thighs.          ALLERGIES:  is allergic to amlodipine, benicar [olmesartan medoxomil], doxepin hcl, estradiol, gabapentin, levofloxacin, lisinopril, nebivolol hcl, olmesartan, tramadol hcl, penicillins, and sulfa antibiotics.  Meds: Current Outpatient  Medications  Medication Sig Dispense Refill   amLODipine (NORVASC) 10 MG tablet TAKE ONE TABLET BY MOUTH AT NOON Needs appointment for further refills 90 tablet 1   Artificial Tear Solution (SOOTHE XP OP) Place 2 drops  into both eyes 2 (two) times daily.     cetaphil (CETAPHIL) cream Apply 1 application  topically daily as needed (dry skin).     chlorthalidone (HYGROTON) 25 MG tablet TAKE ONE TABLET BY MOUTH AT NOON 90 tablet 1   fluticasone (FLONASE) 50 MCG/ACT nasal spray Place 2 sprays into both nostrils daily as needed for allergies.     furosemide (LASIX) 20 MG tablet Take 20 mg by mouth daily as needed for fluid or edema.     levothyroxine (SYNTHROID, LEVOTHROID) 88 MCG tablet Take 88 mcg by mouth daily before breakfast.      Multiple Vitamins-Minerals (PRESERVISION AREDS 2+MULTI VIT) CAPS Take 1 capsule by mouth in the morning and at bedtime.     potassium chloride SA (KLOR-CON) 20 MEQ tablet Take 20 mEq by mouth daily as needed (when taking furosemide).     Probiotic Product (ALIGN) 4 MG CAPS Take 2 capsules by mouth daily. gummy     valsartan (DIOVAN) 320 MG tablet Take 1 tablet (320 mg total) by mouth daily at 12 noon. Will need an appointment to for future refill 30 tablet 0   clindamycin (CLEOCIN) 300 MG capsule Take 600 mg by mouth See admin instructions. Take before dental appointments (Patient not taking: Reported on 08/04/2023)     meclizine (ANTIVERT) 12.5 MG tablet Take 12.5 mg by mouth 3 (three) times daily as needed for dizziness. (Patient not taking: Reported on 08/04/2023)     No current facility-administered medications for this encounter.    Physical Findings: The patient is in no acute distress. Patient is alert and oriented. Wt Readings from Last 3 Encounters:  08/04/23 198 lb 2 oz (89.9 kg)  05/25/23 196 lb (88.9 kg)  03/26/23 200 lb (90.7 kg)    height is 5\' 4"  (1.626 m) and weight is 198 lb 2 oz (89.9 kg). Her temporal temperature is 97.3 F (36.3 C) (abnormal). Her blood pressure is 155/58 (abnormal) and her pulse is 61. Her respiration is 18 and oxygen saturation is 100%. .  General: Alert and oriented, in no acute distress; breathing comfortably, good coloring HEENT: Head  is normocephalic. Extraocular movements are intact. Oropharynx is clear, tongue shows no evidence of thrush.  Neck: Neck is notable for no palpable lymphadenopathy.  Heart: Regular in rate and rhythm with no murmurs, rubs, or gallops. Chest: Clear to auscultation bilaterally, with no rhonchi, wheezes, or rales. Abdomen: Soft, nontender, nondistended, with no rigidity or guarding. Extremities: No cyanosis or edema. Lymphatics: see Neck Exam Psychiatric: Judgment and insight are intact. Affect is appropriate.   Lab Findings: Lab Results  Component Value Date   WBC 8.3 09/01/2022   HGB 10.7 (L) 09/01/2022   HCT 32.1 (L) 09/01/2022   MCV 93.9 09/01/2022   PLT 282 09/01/2022    Lab Results  Component Value Date   TSH 2.260 04/29/2022    Radiographic Findings: CT Chest Wo Contrast  Result Date: 08/03/2023 CLINICAL DATA:  Non-small cell lung cancer, radiation therapy complete. * Tracking Code: BO * EXAM: CT CHEST WITHOUT CONTRAST TECHNIQUE: Multidetector CT imaging of the chest was performed following the standard protocol without IV contrast. RADIATION DOSE REDUCTION: This exam was performed according to the departmental dose-optimization program which includes automated exposure control, adjustment  of the mA and/or kV according to patient size and/or use of iterative reconstruction technique. COMPARISON:  01/26/2023. FINDINGS: Cardiovascular: Atherosclerotic calcification of the aorta and coronary arteries. Heart is enlarged. No pericardial effusion. Mediastinum/Nodes: Mediastinal lymph nodes measure up to 10 mm in the low right paratracheal station, as before. Hilar regions are difficult to definitively evaluate without IV contrast. Surgical clips in the left axilla, left mastectomy. No axillary adenopathy. Esophagus is grossly unremarkable. Lungs/Pleura: Slight increase in post treatment consolidation in the apical segment right upper lobe (8/36) with increasing surrounding architectural  distortion and bronchiectasis. Associated fiducial markers. Separate fiducial marker with surrounding consolidation, ground-glass, bronchiectasis and architectural distortion in the posterior segment right upper lobe (8/59), new from 01/26/2023. Additional small scattered bilateral pulmonary nodules measure up to 5 mm in the medial right lower lobe (8/83), stable. No pleural fluid. Airway is unremarkable. Upper Abdomen: Visualized portions of the liver, adrenal glands, kidneys, spleen, pancreas, stomach and bowel are grossly unremarkable with the exception of a small hiatal hernia. No upper abdominal adenopathy. Musculoskeletal: Degenerative changes in the spine. No worrisome lytic or sclerotic lesions. IMPRESSION: 1. Evolving changes of radiation therapy in the apical and posterior segments of the right upper lobe note is made that there is increasing consolidation associated with the apical segment treatment area. Difficult to exclude disease progression. Continued attention on follow-up recommended. 2. Previously discussed 6 mm anterior segment right upper lobe nodule is now obscured by evolving radiation treatment changes. 3. Stable scattered small pulmonary nodules. Recommend continued attention on follow-up. 4. Aortic atherosclerosis (ICD10-I70.0). Coronary artery calcification. Electronically Signed   By: Leanna Battles M.D.   On: 08/03/2023 09:42    Impression/Plan:  Stage I NSCLC of the RUL and RML; s/p SBRT in 2022 and 2024  1) It was a pleasure seeing this patient again today. She has recovered well since the radiation treatment. Recent imaging shows favorable response to treatment. I personally reviewed these images with the patient. She was happy to hear these results.   2) Follow-up: Dr. Basilio Cairo recommends repeat chest CT without contrast in 6 months. The patient was encouraged to call with any issues or questions before then.  On date of service, in total, I spent 20 minutes on this encounter.  Patient was seen in person. _____________________________________   Joyice Faster, PA

## 2023-08-10 DIAGNOSIS — M79674 Pain in right toe(s): Secondary | ICD-10-CM | POA: Diagnosis not present

## 2023-08-10 DIAGNOSIS — B351 Tinea unguium: Secondary | ICD-10-CM | POA: Diagnosis not present

## 2023-08-10 DIAGNOSIS — M79675 Pain in left toe(s): Secondary | ICD-10-CM | POA: Diagnosis not present

## 2023-08-10 DIAGNOSIS — L600 Ingrowing nail: Secondary | ICD-10-CM | POA: Diagnosis not present

## 2023-08-27 ENCOUNTER — Ambulatory Visit: Payer: Medicare HMO | Admitting: Nurse Practitioner

## 2023-09-22 ENCOUNTER — Ambulatory Visit
Admission: RE | Admit: 2023-09-22 | Discharge: 2023-09-22 | Disposition: A | Discharge status: Home / Self Care | Payer: Medicare HMO | Source: Ambulatory Visit | Attending: Hematology and Oncology

## 2023-09-22 DIAGNOSIS — Z1231 Encounter for screening mammogram for malignant neoplasm of breast: Secondary | ICD-10-CM | POA: Diagnosis not present

## 2023-09-22 DIAGNOSIS — Z171 Estrogen receptor negative status [ER-]: Secondary | ICD-10-CM

## 2023-10-01 ENCOUNTER — Encounter: Payer: Self-pay | Admitting: Nurse Practitioner

## 2023-10-01 ENCOUNTER — Ambulatory Visit: Payer: Medicare HMO | Attending: Nurse Practitioner | Admitting: Nurse Practitioner

## 2023-10-01 VITALS — BP 148/68 | HR 52 | Ht 64.5 in | Wt 198.4 lb

## 2023-10-01 DIAGNOSIS — R001 Bradycardia, unspecified: Secondary | ICD-10-CM

## 2023-10-01 DIAGNOSIS — I10 Essential (primary) hypertension: Secondary | ICD-10-CM | POA: Diagnosis not present

## 2023-10-01 DIAGNOSIS — I5032 Chronic diastolic (congestive) heart failure: Secondary | ICD-10-CM

## 2023-10-01 DIAGNOSIS — R5383 Other fatigue: Secondary | ICD-10-CM | POA: Diagnosis not present

## 2023-10-01 NOTE — Patient Instructions (Signed)
Medication Instructions:  Your physician recommends that you continue on your current medications as directed. Please refer to the Current Medication list given to you today.  *If you need a refill on your cardiac medications before your next appointment, please call your pharmacy*   Lab Work: NONE ordered at this time of appointment    Testing/Procedures: NONE ordered at this time of appointment     Follow-Up: At [facility], you and your health needs are our priority.  As part of our continuing mission to provide you with exceptional heart care, we have created designated Provider Care Teams.  These Care Teams include your primary Cardiologist (physician) and Advanced Practice Providers (APPs -  Physician Assistants and Nurse Practitioners) who all work together to provide you with the care you need, when you need it.  We recommend signing up for the patient portal called "MyChart".  Sign up information is provided on this After Visit Summary.  MyChart is used to connect with patients for Virtual Visits (Telemedicine).  Patients are able to view lab/test results, encounter notes, upcoming appointments, etc.  Non-urgent messages can be sent to your provider as well.   To learn more about what you can do with MyChart, go to [email or web address].    Your next appointment:   6 month(s)  Provider:   David Harding, MD     Other Instructions   

## 2023-10-01 NOTE — Progress Notes (Signed)
Office Visit    Patient Name: Alisha [name] Date of Encounter: 10/01/2023  Primary Care Provider:  Farris Has, MD Primary Cardiologist:  Bryan Lemma, MD  Chief Complaint    88 year old female with a history of chronic diastolic heart failure, bradycardia, hypertension, aortic atherosclerosis, hypothyroidism, breast cancer, and lung cancer who presents for follow-up related to heart failure and hypertension.   Past Medical History    Past Medical History:  Diagnosis Date   Arthritis    Asthma    Never smoker   Blood transfusion    over 40 y ears   Cancer [facility])    CHF (congestive heart failure) (HCC)    diastolic   COVID 2595   n/v/diarrhea   Dysrhythmia    Bradycardia per Dr. Elissa Hefty note on 04/2022   History of radiation therapy 09/07/19- 10/06/19   Left Breast 15 fractions of 2.67 Gy to total 40.05 Gy. Left breast boost 5 fractions of 2 Gy each to total 10 Gy   Hypertension    on meds   Hypothyroidism    on meds   Malignant neoplasm of left breast metastatic to lung (HCC) 06/22/2019   a) 9/'20 ->  Upper-Outer Quadrant of Left Breast: Clinical stage from 06/22/2019: Stage IB (cT1b, cN0, cM0, G3, ER-, PR-, HER2-) => L Lumpectomy & XRT - (f/u Path 11/21: Stage 1B (pT1b, pN0,cM0; 1/'23: Recurrent Upper-Inner Quadrant ( Clinical Stage cT1, cN0, cM0) -mastectomy; now treated with XRT.   Malignant neoplasm of right upper lobe of lung (HCC) 01/23/2021   Stage IB (cT1c, cN0, cM0, G2, ER-, PR-, HER2-   Personal history of radiation therapy    Pre-diabetes    Past Surgical History:  Procedure Laterality Date   ABDOMINAL HYSTERECTOMY     32 yrs   AXILLARY SENTINEL NODE BIOPSY Left 11/26/2021   Procedure: LEFT AXILLARY SENTINEL NODE BIOPSY;  Surgeon: Almond Lint, MD;  Location: [facility] OR;  Service: General;  Laterality: Left;   BREAST LUMPECTOMY Left 08/11/2016   BREAST LUMPECTOMY Left 07/27/2019   BREAST LUMPECTOMY WITH RADIOACTIVE SEED AND SENTINEL LYMPH NODE BIOPSY  Left 07/27/2019   Procedure: LEFT BREAST LUMPECTOMY WITH RADIOACTIVE SEED AND LEFT SENTINEL LYMPH NODE MAPPING;  Surgeon: Harriette Bouillon, MD;  Location: [facility] OR;  Service: General;  Laterality: Left;   BREAST LUMPECTOMY WITH RADIOACTIVE SEED LOCALIZATION Left 08/11/2016   Procedure: LEFT BREAST LUMPECTOMY WITH RADIOACTIVE SEED LOCALIZATION;  Surgeon: Harriette Bouillon, MD;  Location: [facility];  Service: General;  Laterality: Left;   BRONCHIAL BIOPSY  12/27/2020   Procedure: BRONCHIAL BIOPSIES;  Surgeon: Leslye Peer, MD;  Location: [facility] ENDOSCOPY;  Service: Pulmonary;;   BRONCHIAL BIOPSY  09/29/2022   Procedure: BRONCHIAL BIOPSIES;  Surgeon: Leslye Peer, MD;  Location: [facility] ENDOSCOPY;  Service: Pulmonary;;   BRONCHIAL BRUSHINGS  12/27/2020   Procedure: BRONCHIAL BRUSHINGS;  Surgeon: Leslye Peer, MD;  Location: [facility] ENDOSCOPY;  Service: Pulmonary;;   BRONCHIAL BRUSHINGS  09/29/2022   Procedure: BRONCHIAL BRUSHINGS;  Surgeon: Leslye Peer, MD;  Location: [facility] ENDOSCOPY;  Service: Pulmonary;;   BRONCHIAL NEEDLE ASPIRATION BIOPSY  12/27/2020   Procedure: BRONCHIAL NEEDLE ASPIRATION BIOPSIES;  Surgeon: Leslye Peer, MD;  Location: [facility] ENDOSCOPY;  Service: Pulmonary;;   BRONCHIAL NEEDLE ASPIRATION BIOPSY  09/29/2022   Procedure: BRONCHIAL NEEDLE ASPIRATION BIOPSIES;  Surgeon: Leslye Peer, MD;  Location: [facility] ENDOSCOPY;  Service: Pulmonary;;   BRONCHIAL WASHINGS  12/27/2020   Procedure: BRONCHIAL WASHINGS;  Surgeon: Leslye Peer, MD;  Location: [facility] ENDOSCOPY;  Service: Pulmonary;;   BRONCHIAL WASHINGS  09/29/2022   Procedure: BRONCHIAL WASHINGS;  Surgeon: Leslye Peer, MD;  Location: [facility] ENDOSCOPY;  Service: Pulmonary;;   CATARACT EXTRACTION, BILATERAL     CORONARY CALCIUM SCORE-CT ANGIOGRAM  04/2019   Small, < 5mm nodule noted.  Difficult study to read.  Coronary calcium score was 11.  Significant motion artifact, but visible territory showed minimal disease.  Minimal  calcium noted in the ostial LAD but no suggestion of significant CAD.   EYE SURGERY     FIDUCIAL MARKER PLACEMENT  12/27/2020   Procedure: FIDUCIAL MARKER PLACEMENT;  Surgeon: Leslye Peer, MD;  Location: [facility] ENDOSCOPY;  Service: Pulmonary;;   FIDUCIAL MARKER PLACEMENT  09/29/2022   Procedure: FIDUCIAL MARKER PLACEMENT;  Surgeon: Leslye Peer, MD;  Location: [facility] ENDOSCOPY;  Service: Pulmonary;;   JOINT REPLACEMENT Bilateral    left knee 2012   KNEE ARTHROPLASTY  02/17/2012   Procedure: COMPUTER ASSISTED TOTAL KNEE ARTHROPLASTY;  Surgeon: Cammy Copa, MD;  Location: [facility];  Service: Orthopedics;  Laterality: Right;  Right total knee replacement   MASTECTOMY W/ SENTINEL NODE BIOPSY Left 11/26/2021   Procedure: LEFT BREAST MASTECTOMY;  Surgeon: Almond Lint, MD;  Location: [facility];  Service: General;  Laterality: Left;   TRANSTHORACIC ECHOCARDIOGRAM  04/02/2020   Normal LV function.  No R WMA.  Moderate LVH.  GRII DD with elevated filling pressures.  Moderate LA dilation with trivial MR.  Mild aortic valve sclerosis.   VIDEO BRONCHOSCOPY WITH ENDOBRONCHIAL NAVIGATION N/A 12/27/2020   Procedure: VIDEO BRONCHOSCOPY WITH ENDOBRONCHIAL NAVIGATION;  Surgeon: Leslye Peer, MD;  Location: [facility] ENDOSCOPY;  Service: Pulmonary;  Laterality: N/A;   VIDEO BRONCHOSCOPY WITH RADIAL ENDOBRONCHIAL ULTRASOUND  09/29/2022   Procedure: VIDEO BRONCHOSCOPY WITH RADIAL ENDOBRONCHIAL ULTRASOUND;  Surgeon: Leslye Peer, MD;  Location: [facility] ENDOSCOPY;  Service: Pulmonary;;    Allergies  Allergies  Allergen Reactions   Amlodipine Other (See Comments)    Other reaction(s): swelling, ha, itching   Benicar [Olmesartan Medoxomil] Other (See Comments)    Unknown   Doxepin Hcl     Other reaction(s): Body swelling   Estradiol     Other reaction(s): itching   Gabapentin     Other reaction(s): swelling   Levofloxacin     Other reaction(s): Unknown   Lisinopril Itching    No energy Other reaction(s):  itching, hair loss   Nebivolol Hcl     Other reaction(s): fatigue   Olmesartan     Other reaction(s): hair loss, nail changes   Tramadol Hcl     Other reaction(s): abdominal pain   Penicillins Itching and Rash    Has patient had a PCN reaction causing immediate rash, facial/tongue/throat swelling, SOB or lightheadedness with hypotension: No Has patient had a PCN reaction causing severe rash involving mucus membranes or skin necrosis: No Has patient had a PCN reaction that required hospitalization: No Has patient had a PCN reaction occurring within the last 10 years: No If all of the above answers are "NO", then may proceed with Cephalosporin use.   Sulfa Antibiotics Itching and Rash    Other reaction(s): Unknown     Labs/Other Studies Reviewed    The following studies were reviewed today:  Cardiac Studies & Procedures      ECHOCARDIOGRAM  ECHOCARDIOGRAM COMPLETE 04/02/2020  Narrative ECHOCARDIOGRAM REPORT    Patient Name:   ATHEA HEILIGER Date of Exam: 04/02/2020 Medical Rec #:  [id number]  Height:       64.5 in Accession #:    [id number]           Weight:       216.8 lb Date of Birth:  March 26, 1935            BSA:          2.035 m Patient Age:    85 years             BP:           156/84 mmHg Patient Gender: F                    HR:           52 bpm. Exam Location:  [address]  Procedure: 2D Echo, Cardiac Doppler and Color Doppler  Indications:    R06.00  History:        Patient has no prior history of Echocardiogram examinations. Bradycardia, Signs/Symptoms:Shortness of Breath and Fatigue; Risk Factors:Hypertension.  Sonographer:    Samule Ohm RDCS Referring Phys: [id number] DAVID W HARDING  IMPRESSIONS   1. Left ventricular ejection fraction, by estimation, is 60 to 65%. The left ventricle has normal function. The left ventricle has no regional wall motion abnormalities. There is moderate asymmetric left ventricular hypertrophy. Left  ventricular diastolic parameters are consistent with Grade II diastolic dysfunction (pseudonormalization). Elevated left ventricular end-diastolic pressure. 2. Right ventricular systolic function is normal. The right ventricular size is normal. There is normal pulmonary artery systolic pressure. The estimated right ventricular systolic pressure is 23.8 mmHg. 3. Left atrial size was moderately dilated. 4. The mitral valve is normal in structure. Trivial mitral valve regurgitation. No evidence of mitral stenosis. 5. The aortic valve is tricuspid. Aortic valve regurgitation is trivial. Mild aortic valve sclerosis is present, with no evidence of aortic valve stenosis. 6. The inferior vena cava is normal in size with greater than 50% respiratory variability, suggesting right atrial pressure of 3 mmHg.  FINDINGS Left Ventricle: Left ventricular ejection fraction, by estimation, is 60 to 65%. The left ventricle has normal function. The left ventricle has no regional wall motion abnormalities. The left ventricular internal cavity size was normal in size. There is moderate asymmetric left ventricular hypertrophy. Left ventricular diastolic parameters are consistent with Grade II diastolic dysfunction (pseudonormalization). Elevated left ventricular end-diastolic pressure.  Right Ventricle: The right ventricular size is normal. No increase in right ventricular wall thickness. Right ventricular systolic function is normal. There is normal pulmonary artery systolic pressure. The tricuspid regurgitant velocity is 2.28 m/s, and with an assumed right atrial pressure of 3 mmHg, the estimated right ventricular systolic pressure is 23.8 mmHg.  Left Atrium: Left atrial size was moderately dilated.  Right Atrium: Right atrial size was normal in size.  Pericardium: There is no evidence of pericardial effusion.  Mitral Valve: The mitral valve is normal in structure. Normal mobility of the mitral valve leaflets.  Trivial mitral valve regurgitation. No evidence of mitral valve stenosis.  Tricuspid Valve: The tricuspid valve is normal in structure. Tricuspid valve regurgitation is trivial. No evidence of tricuspid stenosis.  Aortic Valve: The aortic valve is tricuspid. Aortic valve regurgitation is trivial. Mild aortic valve sclerosis is present, with no evidence of aortic valve stenosis. Aortic valve mean gradient measures 7.7 mmHg. Aortic valve peak gradient measures 15.9 mmHg. Aortic valve area, by VTI measures 2.08 cm.  Pulmonic Valve: The pulmonic valve was normal in structure. Pulmonic valve regurgitation is not visualized.  No evidence of pulmonic stenosis.  Aorta: The aortic root is normal in size and structure.  Venous: The inferior vena cava is normal in size with greater than 50% respiratory variability, suggesting right atrial pressure of 3 mmHg.  IAS/Shunts: No atrial level shunt detected by color flow Doppler.   LEFT VENTRICLE PLAX 2D LVIDd:         5.10 cm  Diastology LVIDs:         2.50 cm  LV e' lateral:   6.96 cm/s LV PW:         1.10 cm  LV E/e' lateral: 13.2 LV IVS:        1.30 cm  LV e' medial:    6.85 cm/s LVOT diam:     2.00 cm  LV E/e' medial:  13.5 LV SV:         96 LV SV Index:   47 LVOT Area:     3.14 cm   RIGHT VENTRICLE             IVC RV S prime:     12.40 cm/s  IVC diam: 1.30 cm TAPSE (M-mode): 2.1 cm RVSP:           23.8 mmHg  LEFT ATRIUM              Index       RIGHT ATRIUM           Index LA diam:        3.50 cm  1.72 cm/m  RA Pressure: 3.00 mmHg LA Vol (A2C):   115.0 ml 56.50 ml/m RA Area:     17.60 cm LA Vol (A4C):   66.0 ml  32.43 ml/m RA Volume:   46.10 ml  22.65 ml/m LA Biplane Vol: 90.0 ml  44.22 ml/m AORTIC VALVE AV Area (Vmax):    2.16 cm AV Area (Vmean):   2.16 cm AV Area (VTI):     2.08 cm AV Vmax:           199.67 cm/s AV Vmean:          127.667 cm/s AV VTI:            0.462 m AV Peak Grad:      15.9 mmHg AV Mean Grad:       7.7 mmHg LVOT Vmax:         137.00 cm/s LVOT Vmean:        87.800 cm/s LVOT VTI:          0.305 m LVOT/AV VTI ratio: 0.66  AORTA Ao Root diam: 3.50 cm Ao Asc diam:  3.40 cm  MV E velocity: 92.20 cm/s  TRICUSPID VALVE MV A velocity: 87.80 cm/s  TR Peak grad:   20.8 mmHg MV E/A ratio:  1.05        TR Vmax:        228.00 cm/s Estimated RAP:  3.00 mmHg RVSP:           23.8 mmHg  SHUNTS Systemic VTI:  0.30 m Systemic Diam: 2.00 cm  Armanda Magic MD Electronically signed by Armanda Magic MD Signature Date/Time: 04/02/2020/5:13:40 PM    Final   MONITORS  LONG TERM MONITOR (3-14 DAYS) 05/04/2019  Narrative  Predominant rhythm is sinus with a minimum heart rate of 36 bpm, maximum heart rate 88 bpm.  7 runs of PAT/PSVT noted 6-8 beats. Fastest heart rate 162 bpm.  Rare PACs with minimal PVCs noted.  Patient noted lightheadedness with  rates ranging from 49 to 61 bpm in sinus rhythm/sinus bradycardia.  Slowest heart rate noted 36 bpm at 5:40 PM. 38 bpm at 11:36 AM  Based on these findings, would appear that the slowest heart rate episodes were all noted while she was still taking diltiazem according to plan.  Further bradycardia episodes were not recorded once she was no longer taking diltiazem  Although there were SVT/PAT runs, would recommend continue to hold AV nodal agents to avoid worsening bradycardia.  Bryan Lemma, MD  CT SCANS  CT CORONARY MORPH W/CTA COR W/SCORE 05/12/2019  Addendum 05/12/2019  5:00 PM ADDENDUM REPORT: 05/12/2019 16:58  EXAM: OVER-READ INTERPRETATION  CT CHEST  The following report is an over-read performed by radiologist Dr. Noe Gens [facility] on 05/12/2019. This over-read does not include interpretation of cardiac or coronary anatomy or pathology. The coronary CTA interpretation by the cardiologist is attached.  COMPARISON:  03/08/2019  FINDINGS: Heart is borderline in size. Visualized aorta normal caliber.  No adenopathy in the lower mediastinum or hila. Small nodules peripherally in the right upper lobe measuring 4 mm, stable. Right middle lobe nodule measures 5 mm and is stable. No confluent opacities or effusions. Small hiatal hernia. Imaging into the upper abdomen shows no acute findings. Chest wall soft tissues are unremarkable.  IMPRESSION: Small pulmonary nodules, 5 mm or less in size, stable. No follow-up needed if patient is low-risk (and has no known or suspected primary neoplasm). Non-contrast chest CT can be considered in 12 months if patient is high-risk. This recommendation follows the consensus statement: Guidelines for Management of Incidental Pulmonary Nodules Detected on CT Images: From the Fleischner Society 2017; Radiology 2017; 284:228-243.   Electronically Signed By: Charlett Nose M.D. On: 05/12/2019 16:58  Narrative CLINICAL DATA:  Chest pain  EXAM: Cardiac CTA  MEDICATIONS: Sub lingual nitro.  4mg   : The patient was scanned on a Siemens Force 192 slice scanner. Gantry rotation speed was 250 msecs. Collimation was .6 mm. A 100 kV prospective scan was triggered in the ascending thoracic aorta at 140 HU's Full mA was used between 35% and 75% of the R-R interval. Average HR during the scan was 48 bpm. The 3D data set was interpreted on a dedicated work station using MPR, MIP and VRT modes. A total of 80 cc of contrast was used.  FINDINGS: Non-cardiac: See separate report from [facility]. No significant findings on limited lung and soft tissue windows.  Calcium Score: Only one small area of ostial LAD calcium noted  Coronary Arteries: Right dominant with no anomalies  LM: Normal  LAD: 1-24% ostial calcified stenosis. Portion of distal LAD not well visualized  D1: Normal  D2: Normal  IM: Normal  Circumflex: Normal  OM1: Normal  OM2: Normal  RCA: Portion of the ostial/ proximal RCA hard to visualize due to motion artifact  otherwise normal  PDA: Normal  PLA: Normal  IMPRESSION: 1.  Calcium score only 11 which is 28 th percentile for age and sex  2.  Normal aortic root 3.1 cm  3. Non diagnostic study do to severe motion artifact. Portion of the ostial RCA and distal LAD not well visualized. However study would be considered low risk due to minimum calcium (only on non obstructive foci in ostial LAD. And no disease in remainder of visualized segments  Charlton Haws  Electronically Signed: By: Charlton Haws M.D. On: 05/12/2019 16:11         Recent Labs: No results found  for requested labs within last 365 days.  Recent Lipid Panel No results found for: "CHOL", "TRIG", "HDL", "CHOLHDL", "VLDL", "LDLCALC", "LDLDIRECT"  History of Present Illness    88 year old female with the above past medical history including chronic diastolic heart failure, bradycardia, hypertension, aortic atherosclerosis, hypothyroidism, breast cancer, and lung cancer.    Coronary CTA in 2020 showed calcium score of 11 (28th percentile).  Echocardiogram in 03/2020 showed EF 60 to 65%, normal LV function, no RWMA, G2 DD, elevated LVEDP, normal LV systolic function, no significant valvular abnormalities. At her follow-up visit in 04/2022 she noted ongoing fatigue that was thought to be multifactorial in the setting of physical deconditioning, obesity, bradycardia, vitamin D deficiency, and breast cancer.  It was noted that should she have ongoing fatigue she may benefit from possible sleep study as she did have some symptoms consistent with sleep apnea. She noted significant improvement in her energy levels with prescription vitamin D.  She was last seen in the office on 05/25/2023 and was stable from a cardiac standpoint.  She did note some mild fatigue.  Of note, her sister had died 2 weeks prior to her visit.   She presents today for follow-up.  Since her last visit she has well from a cardiac standpoint.  She denies any significant  fatigue, denies chest pain, palpitations, dizziness, dyspnea, edema, PND, orthopnea, weight gain.  Unfortunately, her great grandson died last week at the age of 16.  Otherwise, she reports feeling well.  Home Medications    Current Outpatient Medications  Medication Sig Dispense Refill   amLODipine (NORVASC) 10 MG tablet TAKE ONE TABLET BY MOUTH AT NOON Needs appointment for further refills 90 tablet 1   Artificial Tear Solution (SOOTHE XP OP) Place 2 drops into both eyes 2 (two) times daily.     cetaphil (CETAPHIL) cream Apply 1 application  topically daily as needed (dry skin).     chlorthalidone (HYGROTON) 25 MG tablet TAKE ONE TABLET BY MOUTH AT NOON 90 tablet 1   clindamycin (CLEOCIN) 300 MG capsule Take 600 mg by mouth See admin instructions. Take before dental appointments     fluticasone (FLONASE) 50 MCG/ACT nasal spray Place 2 sprays into both nostrils daily as needed for allergies.     furosemide (LASIX) 20 MG tablet Take 20 mg by mouth daily as needed for fluid or edema.     levothyroxine (SYNTHROID, LEVOTHROID) 88 MCG tablet Take 88 mcg by mouth daily before breakfast.      meclizine (ANTIVERT) 12.5 MG tablet Take 12.5 mg by mouth 3 (three) times daily as needed for dizziness.     Multiple Vitamins-Minerals (PRESERVISION AREDS 2+MULTI VIT) CAPS Take 1 capsule by mouth in the morning and at bedtime.     potassium chloride SA (KLOR-CON) 20 MEQ tablet Take 20 mEq by mouth daily as needed (when taking furosemide).     Probiotic Product (ALIGN) 4 MG CAPS Take 2 capsules by mouth daily. gummy     valsartan (DIOVAN) 320 MG tablet Take 1 tablet (320 mg total) by mouth daily at 12 noon. Will need an appointment to for future refill 30 tablet 0   No current facility-administered medications for this visit.     Review of Systems    She denies chest pain, palpitations, dyspnea, pnd, orthopnea, n, v, dizziness, syncope, edema, weight gain, or early satiety. All other systems reviewed and are  otherwise negative except as noted above.   Physical Exam    VS:  BP Marland Kitchen)  148/68   Pulse (!) 52   Ht 5' 4.5" (1.638 m)   Wt 198 lb 6.4 oz (90 kg)   SpO2 98%   BMI 33.53 kg/m   GEN: Well nourished, well developed, in no acute distress. HEENT: normal. Neck: Supple, no JVD, carotid bruits, or masses. Cardiac: RRR, no murmurs, rubs, or gallops. No clubbing, cyanosis, edema.  Radials/DP/PT 2+ and equal bilaterally.  Respiratory:  Respirations regular and unlabored, clear to auscultation bilaterally. GI: Soft, nontender, nondistended, BS + x 4. MS: no deformity or atrophy. Skin: warm and dry, no rash. Neuro:  Strength and sensation are intact. Psych: Normal affect.  Accessory Clinical Findings    ECG personally reviewed by me today - EKG Interpretation Date/Time:  Thursday October 01 2023 15:05:43 EST Ventricular Rate:  52 PR Interval:  200 QRS Duration:  90 QT Interval:  438 QTC Calculation: 407 R Axis:   34  Text Interpretation: Sinus bradycardia Nonspecific ST abnormality When compared with ECG of 22-Nov-2021 14:30, No significant change was found Confirmed by Bernadene Person ([id number]) on 10/01/2023 3:40:43 PM  - no acute changes.   Lab Results  Component Value Date   WBC 8.3 09/01/2022   HGB 10.7 (L) 09/01/2022   HCT 32.1 (L) 09/01/2022   MCV 93.9 09/01/2022   PLT 282 09/01/2022   Lab Results  Component Value Date   CREATININE 0.88 09/01/2022   BUN 21 09/01/2022   NA 140 09/01/2022   K 3.6 09/01/2022   CL 105 09/01/2022   CO2 27 09/01/2022   Lab Results  Component Value Date   ALT 10 09/01/2022   AST 16 09/01/2022   ALKPHOS 61 09/01/2022   BILITOT 0.3 09/01/2022   No results found for: "CHOL", "HDL", "LDLCALC", "LDLDIRECT", "TRIG", "CHOLHDL"  Lab Results  Component Value Date   HGBA1C 5.3 11/22/2021    Assessment & Plan    1. Chronic diastolic heart failure: Echo in 03/2020 showed EF 60 to 65%, normal LV function, no RWMA, G2 DD, elevated LVEDP, normal RV  systolic function, no significant valvular abnormalities. Euvolemic and well compensated on exam.  Continue valsartan, chlorthalidone, Lasix as needed.   2. Hypertension: BP mildly elevated in office today (she has not taken her medicine today), generally well controlled. Continue current antihypertensive regimen.    3. Bradycardia/LBBB: Stable on today's EKG.  Denies symptoms concerning for angina. She is not on any nodal blocking agents. Continue to monitor.   4. Fatigue: Improved with vitamin D supplementation. No indication for further testing at this time.    5. Disposition: Follow-up in 6 months, sooner if needed.  HYPERTENSION CONTROL Vitals:   10/01/23 1506 10/01/23 1552  BP: (!) 170/66 (!) 148/68    The patient's blood pressure is elevated above target today.  In order to address the patient's elevated BP: Blood pressure will be monitored at home to determine if medication changes need to be made.; Follow up with general cardiology has been recommended.       Joylene Grapes, NP 10/01/2023, 4:00 PM

## 2023-10-19 DIAGNOSIS — L84 Corns and callosities: Secondary | ICD-10-CM | POA: Diagnosis not present

## 2023-10-19 DIAGNOSIS — I739 Peripheral vascular disease, unspecified: Secondary | ICD-10-CM | POA: Diagnosis not present

## 2023-10-19 DIAGNOSIS — L603 Nail dystrophy: Secondary | ICD-10-CM | POA: Diagnosis not present

## 2023-10-26 ENCOUNTER — Encounter: Payer: Self-pay | Admitting: Hematology and Oncology

## 2023-10-26 ENCOUNTER — Inpatient Hospital Stay: Payer: Medicare HMO | Attending: Hematology and Oncology | Admitting: Hematology and Oncology

## 2023-10-26 VITALS — BP 150/45 | HR 55 | Temp 99.7°F | Resp 18 | Wt 197.6 lb

## 2023-10-26 DIAGNOSIS — Z90710 Acquired absence of both cervix and uterus: Secondary | ICD-10-CM | POA: Diagnosis not present

## 2023-10-26 DIAGNOSIS — Z90722 Acquired absence of ovaries, bilateral: Secondary | ICD-10-CM | POA: Diagnosis not present

## 2023-10-26 DIAGNOSIS — Z9012 Acquired absence of left breast and nipple: Secondary | ICD-10-CM | POA: Insufficient documentation

## 2023-10-26 DIAGNOSIS — Z1722 Progesterone receptor negative status: Secondary | ICD-10-CM | POA: Diagnosis not present

## 2023-10-26 DIAGNOSIS — R5383 Other fatigue: Secondary | ICD-10-CM | POA: Diagnosis not present

## 2023-10-26 DIAGNOSIS — C50412 Malignant neoplasm of upper-outer quadrant of left female breast: Secondary | ICD-10-CM | POA: Diagnosis not present

## 2023-10-26 DIAGNOSIS — C3411 Malignant neoplasm of upper lobe, right bronchus or lung: Secondary | ICD-10-CM | POA: Diagnosis not present

## 2023-10-26 DIAGNOSIS — Z171 Estrogen receptor negative status [ER-]: Secondary | ICD-10-CM | POA: Insufficient documentation

## 2023-10-26 DIAGNOSIS — Z1732 Human epidermal growth factor receptor 2 negative status: Secondary | ICD-10-CM | POA: Insufficient documentation

## 2023-10-26 DIAGNOSIS — C50212 Malignant neoplasm of upper-inner quadrant of left female breast: Secondary | ICD-10-CM | POA: Insufficient documentation

## 2023-10-26 DIAGNOSIS — Z923 Personal history of irradiation: Secondary | ICD-10-CM | POA: Insufficient documentation

## 2023-10-26 NOTE — Progress Notes (Signed)
 [facility]  Telephone:([phone]) [phone] Fax:(336) [phone]     ID: Alisha [name] DOB: 06/02/1935  MR#: [id number]  RDW#:[id number]  Patient Care Team: Kip Righter, MD as PCP - General (Family Medicine) Anner Alm ORN, MD as PCP - Cardiology (Cardiology) Izell Domino, MD as Attending Physician (Radiation Oncology) Anner Alm ORN, MD as Consulting Physician (Cardiology) Shelah Lamar RAMAN, MD as Consulting Physician (Pulmonary Disease) Loretha Ash, MD as Consulting Physician (Hematology and Oncology) Aron Shoulders, MD as Consulting Physician (General Surgery) Ash Loretha, MD  CHIEF COMPLAINT: triple negative breast cancer; squamous cell lung cancer  CURRENT TREATMENT: Observation  INTERVAL HISTORY:  Minda returns today for follow up of her triple negative breast cancer and non small cell lung cancer.  The patient, with a history of breast cancer, presents for a routine follow-up after a mammogram. She reports overall good health with no changes since the last visit. She previously complained of fatigue, which has improved and is not as severe as before. She has been able to exercise, which she believes is helping with her fatigue. She denies any changes in her breathing, cough, chest pain, shortness of breath, changes in the breast area, bone pain, bowel issues, or trouble urinating.  The patient was prescribed a foot medication by her podiatrist, which she stopped taking as it was not effective. She plans to discuss this with her podiatrist at her next visit. She also mentions a personal concern about her daughter's health and her husband's recent stroke, which has been causing her some distress.  Rest of the pertinent 10 point ROS reviewed and negative.   COVID 19 VACCINATION STATUS: infection 11/2019; Pfizer x4, most recently 03/2021   Oncology History  Bernell Haynie Modeste was noted to have a left breast upper-outer quadrant distortion on screening  mammogram in 2017. She proceeded to biopsy ([id number]), which showed a complex sclerosing lesion. She opted to undergo left lumpectomy ([id number]) on 08/11/2016 under Dr. Vanderbilt, which revealed a radial scar with calcifications.   She presented for routine screening mammogram, which showed a possible mass and adjacent calcifications in the left breast. She was referred to [facility] for further imaging on 06/16/2019. Physical exam performed that day showed a palpable soft thickening in the upper central aspect of the left breast. She underwent left diagnostic mammography with tomography and ultrasonography showing: breast density category B; suspicious 7 mm mass in the 12 o'clock location of the left breast; small indeterminate group of calcifications 4 mm anterior to the mass warrant excision if the mass is positive for malignancy; left axilla negative for adenopathy.  Accordingly on 06/22/2019 she proceeded to biopsy of the left breast area in question. The pathology from this procedure ([id number]) showed: invasive ductal carcinoma, grade 3. Prognostic indicators significant for: estrogen receptor, 0% negative and progesterone receptor, 0% negative. Proliferation marker Ki67 at 30%. HER2 negative by immunohistochemistry (0).  (1) status post left lumpectomy and sentinel lymph node sampling 07/27/2019 for a pT1b pN0, stage IB invasive ductal carcinoma, with clear margins. (a) 2 left axillary lymph nodes were removed No adjuvant chemotherapy planned  Adjuvant radiation 09/07/2019 through 10/06/2019 Site Technique Total Dose (Gy) Dose per Fx (Gy) Completed Fx Beam Energies  Breast, Left: Breast_Lt 3D 40.05/40.05 2.67 15/15 6X, 10X  Breast, Left: Breast_Lt_Bst specialPort 10/10 2 5/5 15E    LUNG CANCER CT of the chest 11/14/2020 showed a new (as compared to July 2020) right upper lobe nodule measuring 0.9 cm, with no other findings of  concern  (a) PET scan 12/07/2020 find the right upper lobe  nodule in question to be hypermetabolic, with no other hypermetabolic areas elsewhere in the chest or abdomen  (b) CA 27-29 and CEA obtained 12/20/2020 were in the normal range  (c) bronchoscopic biopsy 12/27/2020 read as consistent with squamous cell carcinoma  Radiation to lung with curative intent 02/13/2021 through 02/20/2021 Site Technique Total Dose (Gy) Dose per Fx (Gy) Completed Fx Beam Energies  Lung, Right: Lung_Rt_RUL IMRT 54/54 18 3/3 6XFFF    10/18/2021  Left mammogram showed a 1.0 cm mass in the left breast at 10:00, no evidence of left axillary lymphadenopathy Biopsy from this mass showed invasive ductal carcinoma grade 2 of 3, prognostic markers show ER 0% negative, PR 0% negative, KI of 20% and HER2 negative.  11/26/2021 patient underwent left mastectomy, final pathology showed invasive ductal carcinoma, 2 cm, grade, resection margins are -3 sentinel lymph nodes negative for carcinoma, prognostics not repeated.  Initial prognostics ER 0% negative, PR 0% negative, HER2 negative, Ki-67 of 20%.  During her last visit, we have discussed about TC adjuvant chemotherapy for 4 cycles. She declined chemotherapy  Dec 2023, RUL RML NSCLC, pt completed SBRT on 10/31/2022 Most recent CT showed improvement in lung nodules, 6 month follow up recommended by Dr Izell.  PAST MEDICAL HISTORY: Past Medical History:  Diagnosis Date   Arthritis    Asthma    Never smoker   Blood transfusion    over 40 y ears   Cancer (HCC)    CHF (congestive heart failure) (HCC)    diastolic   COVID 7979   n/v/diarrhea   Dysrhythmia    Bradycardia per Dr. Genice note on 04/2022   History of radiation therapy 09/07/19- 10/06/19   Left Breast 15 fractions of 2.67 Gy to total 40.05 Gy. Left breast boost 5 fractions of 2 Gy each to total 10 Gy   Hypertension    on meds   Hypothyroidism    on meds   Malignant neoplasm of left breast metastatic to lung (HCC) 06/22/2019   a) 9/'20 ->  Upper-Outer Quadrant  of Left Breast: Clinical stage from 06/22/2019: Stage IB (cT1b, cN0, cM0, G3, ER-, PR-, HER2-) => L Lumpectomy & XRT - (f/u Path 11/21: Stage 1B (pT1b, pN0,cM0; 1/'23: Recurrent Upper-Inner Quadrant ( Clinical Stage cT1, cN0, cM0) -mastectomy; now treated with XRT.   Malignant neoplasm of right upper lobe of lung (HCC) 01/23/2021   Stage IB (cT1c, cN0, cM0, G2, ER-, PR-, HER2-   Personal history of radiation therapy    Pre-diabetes     PAST SURGICAL HISTORY: Past Surgical History:  Procedure Laterality Date   ABDOMINAL HYSTERECTOMY     32 yrs   AXILLARY SENTINEL NODE BIOPSY Left 11/26/2021   Procedure: LEFT AXILLARY SENTINEL NODE BIOPSY;  Surgeon: Aron Shoulders, MD;  Location: [facility] OR;  Service: General;  Laterality: Left;   BREAST LUMPECTOMY Left 08/11/2016   BREAST LUMPECTOMY Left 07/27/2019   BREAST LUMPECTOMY WITH RADIOACTIVE SEED AND SENTINEL LYMPH NODE BIOPSY Left 07/27/2019   Procedure: LEFT BREAST LUMPECTOMY WITH RADIOACTIVE SEED AND LEFT SENTINEL LYMPH NODE MAPPING;  Surgeon: Vanderbilt Ned, MD;  Location: [facility] OR;  Service: General;  Laterality: Left;   BREAST LUMPECTOMY WITH RADIOACTIVE SEED LOCALIZATION Left 08/11/2016   Procedure: LEFT BREAST LUMPECTOMY WITH RADIOACTIVE SEED LOCALIZATION;  Surgeon: Ned Vanderbilt, MD;  Location: [facility];  Service: General;  Laterality: Left;   BRONCHIAL BIOPSY  12/27/2020   Procedure: BRONCHIAL  BIOPSIES;  Surgeon: Shelah Lamar RAMAN, MD;  Location: [facility] ENDOSCOPY;  Service: Pulmonary;;   BRONCHIAL BIOPSY  09/29/2022   Procedure: BRONCHIAL BIOPSIES;  Surgeon: Shelah Lamar RAMAN, MD;  Location: [facility] ENDOSCOPY;  Service: Pulmonary;;   BRONCHIAL BRUSHINGS  12/27/2020   Procedure: BRONCHIAL BRUSHINGS;  Surgeon: Shelah Lamar RAMAN, MD;  Location: [facility] ENDOSCOPY;  Service: Pulmonary;;   BRONCHIAL BRUSHINGS  09/29/2022   Procedure: BRONCHIAL BRUSHINGS;  Surgeon: Shelah Lamar RAMAN, MD;  Location: [facility] ENDOSCOPY;  Service: Pulmonary;;   BRONCHIAL NEEDLE  ASPIRATION BIOPSY  12/27/2020   Procedure: BRONCHIAL NEEDLE ASPIRATION BIOPSIES;  Surgeon: Shelah Lamar RAMAN, MD;  Location: [facility] ENDOSCOPY;  Service: Pulmonary;;   BRONCHIAL NEEDLE ASPIRATION BIOPSY  09/29/2022   Procedure: BRONCHIAL NEEDLE ASPIRATION BIOPSIES;  Surgeon: Shelah Lamar RAMAN, MD;  Location: [facility] ENDOSCOPY;  Service: Pulmonary;;   BRONCHIAL WASHINGS  12/27/2020   Procedure: BRONCHIAL WASHINGS;  Surgeon: Shelah Lamar RAMAN, MD;  Location: [facility] ENDOSCOPY;  Service: Pulmonary;;   BRONCHIAL WASHINGS  09/29/2022   Procedure: BRONCHIAL WASHINGS;  Surgeon: Shelah Lamar RAMAN, MD;  Location: [facility] ENDOSCOPY;  Service: Pulmonary;;   CATARACT EXTRACTION, BILATERAL     CORONARY CALCIUM SCORE-CT ANGIOGRAM  04/2019   Small, < 5mm nodule noted.  Difficult study to read.  Coronary calcium score was 11.  Significant motion artifact, but visible territory showed minimal disease.  Minimal calcium noted in the ostial LAD but no suggestion of significant CAD.   EYE SURGERY     FIDUCIAL MARKER PLACEMENT  12/27/2020   Procedure: FIDUCIAL MARKER PLACEMENT;  Surgeon: Shelah Lamar RAMAN, MD;  Location: [facility] ENDOSCOPY;  Service: Pulmonary;;   FIDUCIAL MARKER PLACEMENT  09/29/2022   Procedure: FIDUCIAL MARKER PLACEMENT;  Surgeon: Shelah Lamar RAMAN, MD;  Location: [facility] ENDOSCOPY;  Service: Pulmonary;;   JOINT REPLACEMENT Bilateral    left knee 2012   KNEE ARTHROPLASTY  02/17/2012   Procedure: COMPUTER ASSISTED TOTAL KNEE ARTHROPLASTY;  Surgeon: Cordella Glendia Hutchinson, MD;  Location: [facility];  Service: Orthopedics;  Laterality: Right;  Right total knee replacement   MASTECTOMY W/ SENTINEL NODE BIOPSY Left 11/26/2021   Procedure: LEFT BREAST MASTECTOMY;  Surgeon: Aron Shoulders, MD;  Location: [facility];  Service: General;  Laterality: Left;   TRANSTHORACIC ECHOCARDIOGRAM  04/02/2020   Normal LV function.  No R WMA.  Moderate LVH.  GRII DD with elevated filling pressures.  Moderate LA dilation with trivial MR.  Mild aortic valve sclerosis.   VIDEO  BRONCHOSCOPY WITH ENDOBRONCHIAL NAVIGATION N/A 12/27/2020   Procedure: VIDEO BRONCHOSCOPY WITH ENDOBRONCHIAL NAVIGATION;  Surgeon: Shelah Lamar RAMAN, MD;  Location: [facility] ENDOSCOPY;  Service: Pulmonary;  Laterality: N/A;   VIDEO BRONCHOSCOPY WITH RADIAL ENDOBRONCHIAL ULTRASOUND  09/29/2022   Procedure: VIDEO BRONCHOSCOPY WITH RADIAL ENDOBRONCHIAL ULTRASOUND;  Surgeon: Shelah Lamar RAMAN, MD;  Location: [facility] ENDOSCOPY;  Service: Pulmonary;;    FAMILY HISTORY: Family History  Problem Relation Age of Onset   Other Mother        died @ 18 - but was healthy   Hypertension Sister    Diabetes Sister    Diabetes Brother        no longer on medications.    Anesthesia problems Neg Hx    Hypotension Neg Hx    Malignant hyperthermia Neg Hx    Pseudochol deficiency Neg Hx   Patient's father was in his mid 76s years old when he died from unknown causes. Patient's mother died from natural causes at age 82. The patient denies a family hx  of breast or ovarian cancer. She has 5 brothers and 1 sister.   GYNECOLOGIC HISTORY:  No LMP recorded. Patient is postmenopausal. 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  She is now retired.  She describes herself a single.  Her daughter Othel Roch lives in [place].  She has multiple medical problems.  The patient's son died at age 88.  The patient has 1 grandchild, Kayla Lunger, who is buying a home 5 minutes from the patient t and works for [employer].  He has 4 children, 2 have gone through college, one is in between and the other 1 is currently in college.  The patient herself attends a local [employer]   ADVANCED DIRECTIVES: The patient intends to name her  grandson Kayla Lunger as her healthcare power of attorney.  He can be reached at [phone].  She was given the appropriate documents to complete and notarize at her discretion at the time of her 07/21/2019 visit   HEALTH MAINTENANCE: Social History   Tobacco Use   Smoking status: Never   Smokeless tobacco: Never  Vaping Use   Vaping status: Never Used  Substance Use Topics   Alcohol  use: No   Drug use: No     Allergies  Allergen Reactions   Amlodipine  Other (See Comments)    Other reaction(s): swelling, ha, itching   Benicar  [Olmesartan  Medoxomil] Other (See Comments)    Unknown   Doxepin Hcl     Other reaction(s): Body swelling   Estradiol     Other reaction(s): itching   Gabapentin      Other reaction(s): swelling   Levofloxacin     Other reaction(s): Unknown   Lisinopril Itching    No energy Other reaction(s): itching, hair loss   Nebivolol Hcl     Other reaction(s): fatigue   Olmesartan      Other reaction(s): hair loss, nail changes   Tramadol Hcl     Other reaction(s): abdominal pain   Penicillins Itching and Rash    Has patient had a PCN reaction causing immediate rash, facial/tongue/throat swelling, SOB or lightheadedness with hypotension: No Has patient had a PCN reaction causing severe rash involving mucus membranes or skin necrosis: No Has patient had a PCN reaction that required hospitalization: No Has patient had a PCN reaction occurring within the last 10 years: No If all of the above answers are NO, then may proceed with Cephalosporin use.   Sulfa Antibiotics Itching and Rash    Other reaction(s): Unknown    Current Outpatient Medications  Medication Sig Dispense Refill   amLODipine  (NORVASC ) 10 MG tablet TAKE ONE TABLET BY MOUTH AT NOON Needs appointment for further refills 90 tablet 1   Artificial Tear Solution (SOOTHE XP OP) Place 2 drops into both eyes 2 (two) times daily.     cetaphil (CETAPHIL) cream Apply 1 application  topically daily  as needed (dry skin).     chlorthalidone  (HYGROTON ) 25 MG tablet TAKE ONE TABLET BY MOUTH AT NOON 90 tablet 1   clindamycin  (CLEOCIN ) 300 MG capsule Take 600 mg by mouth See admin instructions. Take before dental appointments     fluticasone  (FLONASE ) 50 MCG/ACT nasal spray Place 2 sprays into both nostrils daily as  needed for allergies.     furosemide  (LASIX ) 20 MG tablet Take 20 mg by mouth daily as needed for fluid or edema.     levothyroxine  (SYNTHROID , LEVOTHROID) 88 MCG tablet Take 88 mcg by mouth daily before breakfast.      meclizine  (ANTIVERT ) 12.5 MG tablet Take 12.5 mg by mouth 3 (three) times daily as needed for dizziness.     Multiple Vitamins-Minerals (PRESERVISION AREDS 2+MULTI VIT) CAPS Take 1 capsule by mouth in the morning and at bedtime.     potassium chloride  SA (KLOR-CON ) 20 MEQ tablet Take 20 mEq by mouth daily as needed (when taking furosemide ).     Probiotic Product (ALIGN) 4 MG CAPS Take 2 capsules by mouth daily. gummy     valsartan  (DIOVAN ) 320 MG tablet Take 1 tablet (320 mg total) by mouth daily at 12 noon. Will need an appointment to for future refill 30 tablet 0   No current facility-administered medications for this visit.    OBJECTIVE: African-American woman who appears stated age  Vitals:   10/26/23 1123 10/26/23 1124  BP: (!) 171/45 (!) 150/45  Pulse: (!) 55   Resp: 18   Temp: 99.7 F (37.6 C)   SpO2: 100%       Body mass index is 33.39 kg/m.   Wt Readings from Last 3 Encounters:  10/26/23 197 lb 9.6 oz (89.6 kg)  10/01/23 198 lb 6.4 oz (90 kg)  08/04/23 198 lb 2 oz (89.9 kg)      ECOG FS:2 - Symptomatic, <50% confined to bed  Physical Exam Constitutional:      Appearance: Normal appearance.  Chest:     Comments: Right breast normal to inspection and palpation. Left breast s/p mastectomy. No palpable changes No lymphadenopathy Musculoskeletal:     Cervical back: Normal range of motion and neck supple. No rigidity.  Lymphadenopathy:      Cervical: No cervical adenopathy.  Neurological:     Mental Status: She is alert.      LAB RESULTS:  CMP     Component Value Date/Time   NA 140 09/01/2022 1319   NA 141 01/24/2021 1627   K 3.6 09/01/2022 1319   CL 105 09/01/2022 1319   CO2 27 09/01/2022 1319   GLUCOSE 86 09/01/2022 1319   BUN 21 09/01/2022 1319   BUN 25 01/24/2021 1627   CREATININE 0.88 09/01/2022 1319   CREATININE 0.78 07/21/2019 1525   CALCIUM 9.2 09/01/2022 1319   PROT 7.0 09/01/2022 1319   ALBUMIN 4.5 09/01/2022 1319   AST 16 09/01/2022 1319   AST 15 07/21/2019 1525   ALT 10 09/01/2022 1319   ALT 19 07/21/2019 1525   ALKPHOS 61 09/01/2022 1319   BILITOT 0.3 09/01/2022 1319   BILITOT 0.2 (L) 07/21/2019 1525   GFRNONAA >60 09/01/2022 1319   GFRNONAA >60 07/21/2019 1525   GFRAA 76 12/13/2020 1035   GFRAA >60 07/21/2019 1525    No results found for: TOTALPROTELP, ALBUMINELP, A1GS, A2GS, BETS, BETA2SER, GAMS, MSPIKE, SPEI  No results found for: KPAFRELGTCHN, LAMBDASER, KAPLAMBRATIO  Lab Results  Component Value Date   WBC 8.3 09/01/2022   NEUTROABS 4.8 09/01/2022   HGB 10.7 (L) 09/01/2022   HCT 32.1 (L) 09/01/2022   MCV 93.9 09/01/2022   PLT 282 09/01/2022   No results found for: LABCA2  No components found for: [id number]  No results for input(s): INR in the last 168 hours.  No results found for: LABCA2  No results found for: CAN199  No results found for: CAN125  No results found for: RJW846  Lab Results  Component Value Date   CA2729 15.8 12/20/2020    No components found for: HGQUANT  Lab Results  Component Value Date   CEA1 1.24 12/20/2020   /  CEA ([facility])  Date Value Ref Range Status  12/20/2020 1.24 0.00 - 5.00 ng/mL Final    Comment:    (NOTE) This test was performed using Architect's Chemiluminescent Microparticle Immunoassay. Values obtained from different assay methods cannot be used interchangeably. Please note  that 5-10% of patients who smoke may see CEA levels up to 6.9 ng/mL. Performed at [facility], [address] [place], [place] [address]      No results found for: AFPTUMOR  No results found for: CHROMOGRNA  No results found for: HGBA, HGBA2QUANT, HGBFQUANT, HGBSQUAN (Hemoglobinopathy evaluation)   No results found for: LDH  Lab Results  Component Value Date   IRON 62 04/14/2022   TIBC 256 04/14/2022   IRONPCTSAT 24 04/14/2022   (Iron and TIBC)  Lab Results  Component Value Date   FERRITIN 204 04/14/2022    Urinalysis    Component Value Date/Time   COLORURINE YELLOW 07/02/2018 1752   APPEARANCEUR CLEAR 07/02/2018 1752   LABSPEC 1.021 07/02/2018 1752   PHURINE 5.0 07/02/2018 1752   GLUCOSEU NEGATIVE 07/02/2018 1752   HGBUR NEGATIVE 07/02/2018 1752   BILIRUBINUR NEGATIVE 07/02/2018 1752   KETONESUR 20 (A) 07/02/2018 1752   PROTEINUR NEGATIVE 07/02/2018 1752   UROBILINOGEN 1.0 02/20/2012 0927   NITRITE NEGATIVE 07/02/2018 1752   LEUKOCYTESUR NEGATIVE 07/02/2018 1752    STUDIES: No results found.   ELIGIBLE FOR AVAILABLE RESEARCH PROTOCOL: no  ASSESSMENT:   89 y.o. [employer], [place] with recently diagnosed left breast upper outer quadrant invasive ductal carcinoma, triple negative who is here for follow-up.  Please refer to oncological history for complete history.  Her last breast cancer back in 2020 was in the left breast at 12 o'clock position again IDC triple negative.  At that time she had surgery, adjuvant radiation, no adjuvant chemotherapy.  Since then she also had lung cancer which was treated with SBRT with excellent response.  She then had an abnormal mammogram of the left breast which showed a 1 cm tumor, triple negative on the initial biopsy, grade 2.  Discussion was to consider upfront surgery given her age and she is now status post left mastectomy.  Final pathology showed 2 cm, grade 2 tumor, prognostics not  repeated, initial prognostics ER 0%, PR 0% and HER2 negative. Given triple negative pathology and tumor greater than 5 mm, we have discussed about adjuvant docetaxel and cyclophosphamide every 21 days for 4 cycles especially since she has robust performance status. We have discussed about adverse effects of chemotherapy including but not limited to fatigue, nausea, vomiting, diarrhea, increased risk of cytopenias, neuropathy, infections etc.  She declined adjuvant chemotherapy. Since last visit she had a PET imaging which showed 8 mm RUL nodule, borderline in size for PET resolution, no other major concerns. Sheis currently scheduled for navigational bronchoscopy by Dr Shelah on 11.20. This showed NSCLC, completed SBRT  Breast Cancer Recent mammogram from December 3rd shows no concerning findings. No new changes in the breast area.  Rest of the pertinent 10 point ROS reviewed and negative -Continue surveillance  Fatigue Improvement noted since last visit. Patient has been able to exercise which seems to be helping. -Continue current management and monitor.  NSCLC,  s/p SBRT, has FU with Dr Izell in April. No clinical concerns  General Health Maintenance -Next appointment with Dr. Izell scheduled for April 22nd at 2 PM.  Total encounter time 30 minutes.  *Total Encounter Time as defined by the Centers for Medicare and Medicaid Services includes, in addition to the face-to-face time of a patient visit (documented in the note above) non-face-to-face time: obtaining and reviewing outside history, ordering and reviewing medications, tests or procedures, care coordination (communications with other health care professionals or caregivers) and documentation in the medical record.

## 2023-11-12 DIAGNOSIS — E039 Hypothyroidism, unspecified: Secondary | ICD-10-CM | POA: Diagnosis not present

## 2023-11-12 DIAGNOSIS — E1169 Type 2 diabetes mellitus with other specified complication: Secondary | ICD-10-CM | POA: Diagnosis not present

## 2023-11-12 DIAGNOSIS — R5383 Other fatigue: Secondary | ICD-10-CM | POA: Diagnosis not present

## 2023-11-12 DIAGNOSIS — I5032 Chronic diastolic (congestive) heart failure: Secondary | ICD-10-CM | POA: Diagnosis not present

## 2023-11-12 DIAGNOSIS — I10 Essential (primary) hypertension: Secondary | ICD-10-CM | POA: Diagnosis not present

## 2023-12-16 DIAGNOSIS — H402231 Chronic angle-closure glaucoma, bilateral, mild stage: Secondary | ICD-10-CM | POA: Diagnosis not present

## 2023-12-16 DIAGNOSIS — H35033 Hypertensive retinopathy, bilateral: Secondary | ICD-10-CM | POA: Diagnosis not present

## 2023-12-16 DIAGNOSIS — H353132 Nonexudative age-related macular degeneration, bilateral, intermediate dry stage: Secondary | ICD-10-CM | POA: Diagnosis not present

## 2023-12-16 DIAGNOSIS — H40053 Ocular hypertension, bilateral: Secondary | ICD-10-CM | POA: Diagnosis not present

## 2023-12-16 DIAGNOSIS — H524 Presbyopia: Secondary | ICD-10-CM | POA: Diagnosis not present

## 2024-01-15 DIAGNOSIS — H40053 Ocular hypertension, bilateral: Secondary | ICD-10-CM | POA: Diagnosis not present

## 2024-01-15 DIAGNOSIS — H402231 Chronic angle-closure glaucoma, bilateral, mild stage: Secondary | ICD-10-CM | POA: Diagnosis not present

## 2024-01-18 DIAGNOSIS — I739 Peripheral vascular disease, unspecified: Secondary | ICD-10-CM | POA: Diagnosis not present

## 2024-01-18 DIAGNOSIS — L84 Corns and callosities: Secondary | ICD-10-CM | POA: Diagnosis not present

## 2024-01-18 DIAGNOSIS — L603 Nail dystrophy: Secondary | ICD-10-CM | POA: Diagnosis not present

## 2024-01-28 ENCOUNTER — Telehealth: Payer: Self-pay | Admitting: *Deleted

## 2024-01-28 NOTE — Telephone Encounter (Signed)
 CALLED PATIENT TO INFORM OF CT FOR 02-02-24- ARRIVAL TIME- 7:45 AM @ [facility], NO RESTRICTIONS TO SCAN, PATIENT TO RECEIVE RESULTS FROM DR. SQUIRE ON 02-09-24 @ 2 PM, SPOKE WITH PATIENT AND SHE IS AWARE OF THESE APPTS. AND THE INSTRUCTIONS

## 2024-02-02 ENCOUNTER — Encounter (HOSPITAL_COMMUNITY): Payer: Self-pay

## 2024-02-02 ENCOUNTER — Ambulatory Visit (HOSPITAL_COMMUNITY)
Admission: RE | Admit: 2024-02-02 | Discharge: 2024-02-02 | Disposition: A | Discharge status: Home / Self Care | Source: Ambulatory Visit | Attending: Radiology | Admitting: Radiology

## 2024-02-02 DIAGNOSIS — C50912 Malignant neoplasm of unspecified site of left female breast: Secondary | ICD-10-CM | POA: Diagnosis not present

## 2024-02-02 DIAGNOSIS — C3490 Malignant neoplasm of unspecified part of unspecified bronchus or lung: Secondary | ICD-10-CM | POA: Diagnosis not present

## 2024-02-02 DIAGNOSIS — C3411 Malignant neoplasm of upper lobe, right bronchus or lung: Secondary | ICD-10-CM | POA: Insufficient documentation

## 2024-02-02 DIAGNOSIS — J8410 Pulmonary fibrosis, unspecified: Secondary | ICD-10-CM | POA: Diagnosis not present

## 2024-02-02 NOTE — Progress Notes (Signed)
 Alisha [name] presents today for an in person follow up appointment with Dr. Lurena Sally and to receive CT results from 02/02/2024.  PAIN: She is currently in no pain.  over .  RESPIRATORY: Shortness of Breath in the morning when she first wakes up but gets better throughout the day. Pt is on room air. Noted Skin is clean, dry and intact None.  SWALLOWING/DIET: Pt denies dysphagia nothing has become stuck, but feels a lump sensation when swallowing. Patient eats sausage, eggs and grits, garlic bread with cream cheese, dinner baked chicken or sometimes,  OTHER: Patient lives a healthy lifestyle and goes on daily morning walks.     There were no vitals taken for this visit.      Wt Readings from Last 3 Encounters:  10/26/23 197 lb 9.6 oz (89.6 kg)  10/01/23 198 lb 6.4 oz (90 kg)  08/04/23 198 lb 2 oz (89.9 kg)

## 2024-02-09 ENCOUNTER — Ambulatory Visit
Admission: RE | Admit: 2024-02-09 | Discharge: 2024-02-09 | Disposition: A | Discharge status: Home / Self Care | Payer: Self-pay | Source: Ambulatory Visit | Attending: Radiation Oncology | Admitting: Radiation Oncology

## 2024-02-09 VITALS — BP 168/62 | HR 51 | Temp 97.3°F | Resp 18 | Ht 64.5 in | Wt 198.4 lb

## 2024-02-09 DIAGNOSIS — C342 Malignant neoplasm of middle lobe, bronchus or lung: Secondary | ICD-10-CM | POA: Diagnosis not present

## 2024-02-09 DIAGNOSIS — C3411 Malignant neoplasm of upper lobe, right bronchus or lung: Secondary | ICD-10-CM | POA: Diagnosis not present

## 2024-02-09 NOTE — Progress Notes (Signed)
 Radiation Oncology         [phone] ________________________________  Name: Alisha [name] MRN: [id number]  Date: 02/09/2024  DOB: [birth year]  Follow-Up Visit Note  CC: Ronna Coho, MD  Ronna Coho, MD  Diagnosis and Prior Radiotherapy:       ICD-10-CM   1. Malignant neoplasm of upper lobe of right lung [facility])  C34.11 CT CHEST WO CONTRAST        Cancer Staging  Cancer of middle lobe of lung [facility]) Staging form: Lung, AJCC 8th Edition - Clinical stage from 10/07/2022: Stage IA1 (cT1a, cN0, cM0) - Signed by Colie Dawes, MD on 10/07/2022 Stage prefix: Initial diagnosis  Malignant neoplasm of upper lobe of right lung [facility]) Staging form: Lung, AJCC 8th Edition - Clinical stage from 01/23/2021: cT1, cN0, cM0 - Signed by Colie Dawes, MD on 01/23/2021 Stage prefix: Initial diagnosis Laterality: Right  Malignant neoplasm of upper-inner quadrant of left breast in female, estrogen receptor negative (HCC) Staging form: Breast, AJCC 8th Edition - Clinical stage from 10/29/2021: Stage IB (cT1c, cN0, cM0, G2, ER-, PR-, HER2-) - Signed by Percival Brace, NP on 11/06/2021 Stage prefix: Initial diagnosis Histologic grading system: 3 grade system  Malignant neoplasm of upper-outer quadrant of left breast in female, estrogen receptor negative (HCC) Staging form: Breast, AJCC 8th Edition - Clinical stage from 06/22/2019: Stage IB (cT1b, cN0, cM0, G3, ER-, PR-, HER2-) - Signed by Percival Brace, NP on 06/29/2019 Stage prefix: Initial diagnosis Histologic grading system: 3 grade system - Pathologic: Stage IB (pT1b, pN0, cM0, G3, ER-, PR-, HER2-) - Signed by Percival Brace, NP on 08/26/2020 Stage prefix: Initial diagnosis Histologic grading system: 3 grade system    CHIEF COMPLAINT:  Here for follow-up and surveillance of lung cancer and to receive CT scan results from 01/26/23.   First Treatment Date: 2022-10-21 - Last Treatment Date: 2022-10-31 Plan  Name: Lung_RUL_SBRT Site: Lung, Right Technique: SBRT/SRT-IMRT Mode: Photon Dose Per Fraction: 11 Gy Prescribed Dose (Delivered / Prescribed): 55 Gy / 55 Gy Prescribed Fxs (Delivered / Prescribed): 5 / 5   Plan Name: Lung_RML_SBRT Site: Lung, Right Technique: SBRT/SRT-IMRT Mode: Photon Dose Per Fraction: 11 Gy Prescribed Dose (Delivered / Prescribed): 55 Gy / 55 Gy Prescribed Fxs (Delivered / Prescribed): 5 / 5   Radiation Treatment Dates: 02/13/2021 through 02/20/2021 Site Technique Total Dose (Gy) Dose per Fx (Gy) Completed Fx Beam Energies  Lung, Right: Lung_Rt_RUL IMRT 54/54 18 3/3 6XFFF    Radiation Treatment Dates: 09/07/2019 through 10/06/2019 Site Technique Total Dose (Gy) Dose per Fx (Gy) Completed Fx Beam Energies  Breast, Left: Breast_Lt 3D 40.05/40.05 2.67 15/15 6X, 10X  Breast, Left: Breast_Lt_Bst specialPort 10/10 2 5/5 15E    Narrative:  The patient returns today for routine follow-up and to review most recent imaging.   CT of the chest from 02/02/2024 demonstrates stable appearance of the right upper lobe with typical postradiation changes, no evidence of tumor progression.  Several additional scattered small lung nodules appear to be unchanged from previous examination.  Patient states to be doing well overall today. She notes minimal shortness of breath when she first wakes up, but this improves throughout the day.  She denies any changes to her breathing since we last saw her.  ALLERGIES:  is allergic to amlodipine , benicar  [olmesartan  medoxomil], doxepin hcl, estradiol, gabapentin , levofloxacin, lisinopril, nebivolol hcl, olmesartan , tramadol hcl, penicillins, and sulfa antibiotics.  Meds: Current Outpatient Medications  Medication Sig Dispense Refill   amLODipine  (NORVASC ) 10 MG  tablet TAKE ONE TABLET BY MOUTH AT NOON Needs appointment for further refills 90 tablet 1   Artificial Tear Solution (SOOTHE XP OP) Place 2 drops into both eyes 2 (two) times daily.      cetaphil (CETAPHIL) cream Apply 1 application  topically daily as needed (dry skin).     chlorthalidone  (HYGROTON ) 25 MG tablet TAKE ONE TABLET BY MOUTH AT NOON 90 tablet 1   clindamycin  (CLEOCIN ) 300 MG capsule Take 600 mg by mouth See admin instructions. Take before dental appointments     fluticasone  (FLONASE ) 50 MCG/ACT nasal spray Place 2 sprays into both nostrils daily as needed for allergies.     furosemide  (LASIX ) 20 MG tablet Take 20 mg by mouth daily as needed for fluid or edema.     levothyroxine  (SYNTHROID , LEVOTHROID) 88 MCG tablet Take 88 mcg by mouth daily before breakfast.      meclizine  (ANTIVERT ) 12.5 MG tablet Take 12.5 mg by mouth 3 (three) times daily as needed for dizziness.     Multiple Vitamins-Minerals (PRESERVISION AREDS 2+MULTI VIT) CAPS Take 1 capsule by mouth in the morning and at bedtime.     potassium chloride  SA (KLOR-CON ) 20 MEQ tablet Take 20 mEq by mouth daily as needed (when taking furosemide ).     Probiotic Product (ALIGN) 4 MG CAPS Take 2 capsules by mouth daily. gummy     valsartan  (DIOVAN ) 320 MG tablet Take 1 tablet (320 mg total) by mouth daily at 12 noon. Will need an appointment to for future refill 30 tablet 0   No current facility-administered medications for this encounter.    Physical Findings:  The patient is in no acute distress. Patient is alert and oriented. Wt Readings from Last 3 Encounters:  02/09/24 198 lb 6 oz (90 kg)  10/26/23 197 lb 9.6 oz (89.6 kg)  10/01/23 198 lb 6.4 oz (90 kg)    height is 5' 4.5" (1.638 m) and weight is 198 lb 6 oz (90 kg). Her temporal temperature is 97.3 F (36.3 C) (abnormal). Her blood pressure is 168/62 (abnormal) and her pulse is 51 (abnormal). Her respiration is 18 and oxygen saturation is 100%. .  General: Alert and oriented, in no acute distress; breathing comfortably, good coloring HEENT: Head is normocephalic. Extraocular movements are intact.  Neck: Neck is notable for no palpable  lymphadenopathy.  Heart: Regular in rate and rhythm with no murmurs, rubs, or gallops. Chest: Clear to auscultation bilaterally, with no rhonchi, wheezes, or rales. Abdomen: Soft, nontender, nondistended, with no rigidity or guarding. Extremities: No cyanosis or edema. Lymphatics: see Neck Exam Psychiatric: Judgment and insight are intact. Affect is appropriate.   Lab Findings: Lab Results  Component Value Date   WBC 8.3 09/01/2022   HGB 10.7 (L) 09/01/2022   HCT 32.1 (L) 09/01/2022   MCV 93.9 09/01/2022   PLT 282 09/01/2022    Lab Results  Component Value Date   TSH 2.260 04/29/2022    Radiographic Findings: CT Chest Wo Contrast Result Date: 02/09/2024 CLINICAL DATA:  Non-small cell lung cancer, radiation therapy complete. Left breast cancer, radiation therapy complete. * Tracking Code: BO * EXAM: CT CHEST WITHOUT CONTRAST TECHNIQUE: Multidetector CT imaging of the chest was performed following the standard protocol without IV contrast. RADIATION DOSE REDUCTION: This exam was performed according to the departmental dose-optimization program which includes automated exposure control, adjustment of the mA and/or kV according to patient size and/or use of iterative reconstruction technique. COMPARISON:  07/29/2023 FINDINGS: Cardiovascular: Aortic and  left anterior descending coronary artery atheromatous vascular calcifications. Mediastinum/Nodes: Small type 1 hiatal hernia. Lungs/Pleura: Similar pattern of scarring and volume loss apically and anteriorly in the right upper lobe adjacent fiducials. Fiducial also along a peripheral nodular component of this process as on image 54 series 8. Overall morphology of the underlying opacities is unchanged and favors treatment related effects. Several additional scattered small lung nodules are unchanged. Index nodule in the right lower lobe 6 by 5 mm on image 75 series 8, stable by my measurements. Upper Abdomen: Unremarkable Musculoskeletal:  Bilateral glenohumeral arthropathy, right greater than left. Bilateral C7 cervical ribs left mastectomy. Thoracic spondylosis. Continued chronic sclerosis along the manubriosternal junction especially in the upper sternal body as on image 73 series 7. IMPRESSION: 1. Stable appearance of the right upper lobe treatment related effects including scarring and radiation fibrosis. No substantial progression/contour change to indicate active malignancy. 2. Several additional scattered small lung nodules are unchanged. 3. Small type 1 hiatal hernia. 4. Bilateral glenohumeral arthropathy, right greater than left. 5. Bilateral C7 cervical ribs. 6. Left mastectomy. 7. Aortic and left anterior descending coronary artery atheromatous vascular calcifications. Aortic Atherosclerosis (ICD10-I70.0). Electronically Signed   By: Freida Jes M.D.   On: 02/09/2024 14:19    Impression/Plan:  Stage I NSCLC of the RUL and RML; s/p SBRT completed in 2022 and 2024  1) It was a pleasure seeing this patient again today.  I personally reviewed her most recent imaging which demonstrates stable response to treatment and no evidence of progressive or recurrent disease.  2) Follow-up: Dr. Lurena Sally recommends repeat chest CT without contrast in 6 months.  We will see the patient after this image to review the results.  The patient was encouraged to call with any issues or questions before then.   On date of service, in total, I spent 20 minutes on this encounter. Patient was seen in person. _____________________________________   Amiel Kalata, PA

## 2024-02-10 ENCOUNTER — Telehealth: Payer: Self-pay | Admitting: Cardiology

## 2024-02-10 MED ORDER — CHLORTHALIDONE 25 MG PO TABS: 25.0000 mg | ORAL_TABLET | Freq: Every day | ORAL | 2 refills | Status: AC

## 2024-02-10 NOTE — Telephone Encounter (Signed)
 RX sent to requested Pharmacy

## 2024-02-10 NOTE — Telephone Encounter (Signed)
*  STAT* If patient is at the pharmacy, call can be transferred to refill team.   1. Which medications need to be refilled? (please list name of each medication and dose if known)   chlorthalidone  (HYGROTON ) 25 MG tablet  amLODipine  (NORVASC ) 10 MG tablet   2. Would you like to learn more about the convenience, safety, & potential cost savings by using the [facility]?   3. Are you open to using the [facility] (Type [facility]. ).  4. Which pharmacy/location (including street and city if local pharmacy) is medication to be sent to?  [id number] - [place], [place] - [address]   5. Do they need a 30 day or 90 day supply?   90 day  Patient stated she is completely out of this medication.  Patient has appointment scheduled with Dr. Addie Holstein on 6/2.

## 2024-03-17 DIAGNOSIS — H40053 Ocular hypertension, bilateral: Secondary | ICD-10-CM | POA: Diagnosis not present

## 2024-03-17 DIAGNOSIS — H402231 Chronic angle-closure glaucoma, bilateral, mild stage: Secondary | ICD-10-CM | POA: Diagnosis not present

## 2024-04-08 ENCOUNTER — Telehealth: Payer: Self-pay

## 2024-04-08 NOTE — Telephone Encounter (Signed)
 Left voicemail to confirm appt for 6/23

## 2024-04-11 ENCOUNTER — Inpatient Hospital Stay: Attending: Hematology and Oncology | Admitting: Hematology and Oncology

## 2024-04-11 VITALS — BP 149/42 | HR 54 | Temp 98.3°F | Resp 19 | Wt 195.3 lb

## 2024-04-11 DIAGNOSIS — E1169 Type 2 diabetes mellitus with other specified complication: Secondary | ICD-10-CM | POA: Diagnosis not present

## 2024-04-11 DIAGNOSIS — I10 Essential (primary) hypertension: Secondary | ICD-10-CM | POA: Diagnosis not present

## 2024-04-11 DIAGNOSIS — C50412 Malignant neoplasm of upper-outer quadrant of left female breast: Secondary | ICD-10-CM

## 2024-04-11 DIAGNOSIS — Z853 Personal history of malignant neoplasm of breast: Secondary | ICD-10-CM | POA: Diagnosis not present

## 2024-04-11 DIAGNOSIS — Z9012 Acquired absence of left breast and nipple: Secondary | ICD-10-CM | POA: Diagnosis not present

## 2024-04-11 DIAGNOSIS — C3481 Malignant neoplasm of overlapping sites of right bronchus and lung: Secondary | ICD-10-CM | POA: Insufficient documentation

## 2024-04-11 DIAGNOSIS — Z90710 Acquired absence of both cervix and uterus: Secondary | ICD-10-CM | POA: Diagnosis not present

## 2024-04-11 DIAGNOSIS — Z923 Personal history of irradiation: Secondary | ICD-10-CM | POA: Diagnosis not present

## 2024-04-11 DIAGNOSIS — I5032 Chronic diastolic (congestive) heart failure: Secondary | ICD-10-CM | POA: Diagnosis not present

## 2024-04-11 DIAGNOSIS — Z171 Estrogen receptor negative status [ER-]: Secondary | ICD-10-CM

## 2024-04-11 NOTE — Progress Notes (Signed)
 [facility]  [phone] [phone]     ID: Alisha [name] DOB: 01-14-1935  MR#: [id number]  RDW#:[id number]  Patient Care Team: Kip Righter, MD as PCP - General (Family Medicine) Anner Alm ORN, MD as PCP - Cardiology (Cardiology) Izell Domino, MD as Attending Physician (Radiation Oncology) Anner Alm ORN, MD as Consulting Physician (Cardiology) Shelah Lamar RAMAN, MD as Consulting Physician (Pulmonary Disease) Loretha Ash, MD as Consulting Physician (Hematology and Oncology) Aron Shoulders, MD as Consulting Physician (General Surgery) Ash Loretha, MD  CHIEF COMPLAINT: triple negative breast cancer; squamous cell lung cancer  CURRENT TREATMENT: Observation  INTERVAL HISTORY:  Alisha [name] returns today for follow up of her triple negative breast cancer and non small cell lung cancer.  Discussed the use of AI scribe software for clinical note transcription with the patient, who gave verbal consent to proceed.  History of Present Illness Alisha [name] is an 89 year old female with a history of triple negative breast cancer and lung cancer who presents for follow-up.   She has a history of triple negative breast cancer, initially diagnosed in 2020 and again in 2023. She underwent a mastectomy for the left breast and is due for a mammogram of the right breast in December. She experiences intermittent pain in the right breast, described as 'just pain,' but has no new symptoms such as cough, chest pain, or shortness of breath.  She also has a history of lung cancer for which she underwent radiation treatment in December 2023. A recent CT scan in April showed that the treated lung nodule is stable with no new nodules. She is scheduled for a follow-up CT scan in October.  No recent falls, headaches, or changes in sleep. She sleeps well most of the time and eats well. She has no summer plans and is currently helping her son in law, who  recently had a stroke, with his move to [place].  Rest of the pertinent 10 point ROS reviewed and negative.   COVID 19 VACCINATION STATUS: infection 11/2019; Pfizer x4, most recently 03/2021   Oncology History  Alisha [name] was noted to have a left breast upper-outer quadrant distortion on screening mammogram in 2017. She proceeded to biopsy ([id number]), which showed a complex sclerosing lesion. She opted to undergo left lumpectomy ([id number]) on 08/11/2016 under Dr. Vanderbilt, which revealed a radial scar with calcifications.   She presented for routine screening mammogram, which showed a possible mass and adjacent calcifications in the left breast. She was referred [facility] imaging on 06/16/2019. Physical exam performed that day showed a palpable soft thickening in the upper central aspect of the left breast. She underwent left diagnostic mammography with tomography and ultrasonography showing: breast density category B; suspicious 7 mm mass in the 12 o'clock location of the left breast; small indeterminate group of calcifications 4 mm anterior to the mass warrant excision if the mass is positive for malignancy; left axilla negative for adenopathy.  Accordingly on 06/22/2019 she proceeded to biopsy of the left breast area in question. The pathology from this procedure ([id number]) showed: invasive ductal carcinoma, grade 3. Prognostic indicators significant for: estrogen receptor, 0% negative and progesterone receptor, 0% negative. Proliferation marker Ki67 at 30%. HER2 negative by immunohistochemistry (0).  (1) status post left lumpectomy and sentinel lymph node sampling 07/27/2019 for a pT1b pN0, stage IB invasive ductal carcinoma, with clear margins. (a) 2 left axillary lymph nodes were removed No adjuvant chemotherapy planned  Adjuvant  radiation 09/07/2019 through 10/06/2019 Site Technique Total Dose (Gy) Dose per Fx (Gy) Completed Fx Beam Energies  Breast,  Left: Breast_Lt 3D 40.05/40.05 2.67 15/15 6X, 10X  Breast, Left: Breast_Lt_Bst specialPort 10/10 2 5/5 15E    LUNG CANCER CT of the chest 11/14/2020 showed a new (as compared to July 2020) right upper lobe nodule measuring 0.9 cm, with no other findings of concern  (a) PET scan 12/07/2020 find the right upper lobe nodule in question to be hypermetabolic, with no other hypermetabolic areas elsewhere in the chest or abdomen  (b) CA 27-29 and CEA obtained 12/20/2020 were in the normal range  (c) bronchoscopic biopsy 12/27/2020 read as consistent with squamous cell carcinoma  Radiation to lung with curative intent 02/13/2021 through 02/20/2021 Site Technique Total Dose (Gy) Dose per Fx (Gy) Completed Fx Beam Energies  Lung, Right: Lung_Rt_RUL IMRT 54/54 18 3/3 6XFFF    10/18/2021  Left mammogram showed a 1.0 cm mass in the left breast at 10:00, no evidence of left axillary lymphadenopathy Biopsy from this mass showed invasive ductal carcinoma grade 2 of 3, prognostic markers show ER 0% negative, PR 0% negative, KI of 20% and HER2 negative.  11/26/2021 patient underwent left mastectomy, final pathology showed invasive ductal carcinoma, 2 cm, grade, resection margins are -3 sentinel lymph nodes negative for carcinoma, prognostics not repeated.  Initial prognostics ER 0% negative, PR 0% negative, HER2 negative, Ki-67 of 20%.  During her last visit, we have discussed about TC adjuvant chemotherapy for 4 cycles. She declined chemotherapy  Dec 2023, RUL RML NSCLC, pt completed SBRT on 10/31/2022 Most recent CT showed improvement in lung nodules, 6 month follow up recommended by Dr Izell.  PAST MEDICAL HISTORY: Past Medical History:  Diagnosis Date   Arthritis    Asthma    Never smoker   Blood transfusion    over 40 y ears   Cancer (HCC)    CHF (congestive heart failure) (HCC)    diastolic   COVID 7979   n/v/diarrhea   Dysrhythmia    Bradycardia per Dr. Genice note on 04/2022   History of  radiation therapy 09/07/19- 10/06/19   Left Breast 15 fractions of 2.67 Gy to total 40.05 Gy. Left breast boost 5 fractions of 2 Gy each to total 10 Gy   Hypertension    on meds   Hypothyroidism    on meds   Malignant neoplasm of left breast metastatic to lung (HCC) 06/22/2019   a) 9/'20 ->  Upper-Outer Quadrant of Left Breast: Clinical stage from 06/22/2019: Stage IB (cT1b, cN0, cM0, G3, ER-, PR-, HER2-) => L Lumpectomy & XRT - (f/u Path 11/21: Stage 1B (pT1b, pN0,cM0; 1/'23: Recurrent Upper-Inner Quadrant ( Clinical Stage cT1, cN0, cM0) -mastectomy; now treated with XRT.   Malignant neoplasm of right upper lobe of lung (HCC) 01/23/2021   Stage IB (cT1c, cN0, cM0, G2, ER-, PR-, HER2-   Personal history of radiation therapy    Pre-diabetes     PAST SURGICAL HISTORY: Past Surgical History:  Procedure Laterality Date   ABDOMINAL HYSTERECTOMY     32 yrs   AXILLARY SENTINEL NODE BIOPSY Left 11/26/2021   Procedure: LEFT AXILLARY SENTINEL NODE BIOPSY;  Surgeon: Aron Shoulders, MD;  Location: [facility];  Service: General;  Laterality: Left;   BREAST LUMPECTOMY Left 08/11/2016   BREAST LUMPECTOMY Left 07/27/2019   BREAST LUMPECTOMY WITH RADIOACTIVE SEED AND SENTINEL LYMPH NODE BIOPSY Left 07/27/2019   Procedure: LEFT BREAST LUMPECTOMY WITH RADIOACTIVE SEED AND LEFT  SENTINEL LYMPH NODE MAPPING;  Surgeon: Vanderbilt Ned, MD;  Location: [facility];  Service: General;  Laterality: Left;   BREAST LUMPECTOMY WITH RADIOACTIVE SEED LOCALIZATION Left 08/11/2016   Procedure: LEFT BREAST LUMPECTOMY WITH RADIOACTIVE SEED LOCALIZATION;  Surgeon: Ned Vanderbilt, MD;  Location: [facility];  Service: General;  Laterality: Left;   BRONCHIAL BIOPSY  12/27/2020   Procedure: BRONCHIAL BIOPSIES;  Surgeon: Shelah Lamar RAMAN, MD;  Location: [facility] ENDOSCOPY;  Service: Pulmonary;;   BRONCHIAL BIOPSY  09/29/2022   Procedure: BRONCHIAL BIOPSIES;  Surgeon: Shelah Lamar RAMAN, MD;  Location: [facility] ENDOSCOPY;  Service: Pulmonary;;    BRONCHIAL BRUSHINGS  12/27/2020   Procedure: BRONCHIAL BRUSHINGS;  Surgeon: Shelah Lamar RAMAN, MD;  Location: [facility] ENDOSCOPY;  Service: Pulmonary;;   BRONCHIAL BRUSHINGS  09/29/2022   Procedure: BRONCHIAL BRUSHINGS;  Surgeon: Shelah Lamar RAMAN, MD;  Location: [facility] ENDOSCOPY;  Service: Pulmonary;;   BRONCHIAL NEEDLE ASPIRATION BIOPSY  12/27/2020   Procedure: BRONCHIAL NEEDLE ASPIRATION BIOPSIES;  Surgeon: Shelah Lamar RAMAN, MD;  Location: [facility] ENDOSCOPY;  Service: Pulmonary;;   BRONCHIAL NEEDLE ASPIRATION BIOPSY  09/29/2022   Procedure: BRONCHIAL NEEDLE ASPIRATION BIOPSIES;  Surgeon: Shelah Lamar RAMAN, MD;  Location: [facility] END[facility]COPY;  Service: Pulmonary;;   BRONCHIAL WASHINGS  12/27/2020   Procedure: BRONCHIAL WASHINGS;  Surgeon: Shelah Lamar RAMAN, MD;  Location: [facility] ENDOSCOPY;  Service: Pulmonary;;   BRONCHIAL WASHINGS  09/29/2022   Procedure: BRONCHIAL WASHINGS;  Surgeon: Shelah Lamar RAMAN, MD;  Location: [facility] ENDOSCOPY;  Service: Pulmonary;;   CATARACT EXTRACTION, BILATERAL     CORONARY CALCIUM SCORE-CT ANGIOGRAM  04/2019   Small, < 5mm nodule noted.  Difficult study to read.  Coronary calcium score was 11.  Significant motion artifact, but visible territory showed minimal disease.  Minimal calcium noted in the ostial LAD but no suggestion of significant CAD.   EYE SURGERY     FIDUCIAL MARKER PLACEMENT  12/27/2020   Procedure: FIDUCIAL MARKER PLACEMENT;  Surgeon: Shelah Lamar RAMAN, MD;  Location: [facility] ENDOSCOPY;  Service: Pulmonary;;   FIDUCIAL MARKER PLACEMENT  09/29/2022   Procedure: FIDUCIAL MARKER PLACEMENT;  Surgeon: Shelah Lamar RAMAN, MD;  Location: [facility] ENDOSCOPY;  Service: Pulmonary;;   JOINT REPLACEMENT Bilateral    left knee 2012   KNEE ARTHROPLASTY  02/17/2012   Procedure: COMPUTER ASSISTED TOTAL KNEE ARTHROPLASTY;  Surgeon: Cordella Glendia Hutchinson, MD;  Location: [facility];  Service: Orthopedics;  Laterality: Right;  Right total knee replacement   MASTECTOMY W/ SENTINEL NODE BIOPSY Left 11/26/2021   Procedure: LEFT  BREAST MASTECTOMY;  Surgeon: Aron Shoulders, MD;  Location: [facility];  Service: General;  Laterality: Left;   TRANSTHORACIC ECHOCARDIOGRAM  04/02/2020   Normal LV function.  No R WMA.  Moderate LVH.  GRII DD with elevated filling pressures.  Moderate LA dilation with trivial MR.  Mild aortic valve sclerosis.   VIDEO BRONCHOSCOPY WITH ENDOBRONCHIAL NAVIGATION N/A 12/27/2020   Procedure: VIDEO BRONCHOSCOPY WITH ENDOBRONCHIAL NAVIGATION;  Surgeon: Shelah Lamar RAMAN, MD;  Location: [facility] ENDOSCOPY;  Service: Pulmonary;  Laterality: N/A;   VIDEO BRONCHOSCOPY WITH RADIAL ENDOBRONCHIAL ULTRASOUND  09/29/2022   Procedure: VIDEO BRONCHOSCOPY WITH RADIAL ENDOBRONCHIAL ULTRASOUND;  Surgeon: Shelah Lamar RAMAN, MD;  Location: [facility] ENDOSCOPY;  Service: Pulmonary;;    FAMILY HISTORY: Family History  Problem Relation Age of Onset   Other Mother        died @ 64 - but was healthy   Hypertension Sister    Diabetes Sister    Diabetes Brother  no longer on medications.    Anesthesia problems Neg Hx    Hypotension Neg Hx    Malignant hyperthermia Neg Hx    Pseudochol deficiency Neg Hx   Patient's father was in his mid 29s years old when he died from unknown causes. Patient's mother died from natural causes at age 87. The patient denies a family hx of breast or ovarian cancer. She has 5 brothers and 1 sister.   GYNECOLOGIC HISTORY:  No LMP recorded. Patient is postmenopausal. 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  She is now retired.  She describes herself a single.  Her daughter Othel Roch lives in [place].  She has multiple medical problems.  The patient's son died at age 93.  The patient has 1 grandchild, Alisha [name], who  is buying a home 5 minutes from the patient t and works for [employer].  He has 4 children, 2 have gone through college, one is in between and the other 1 is currently in college.  The patient herself attends a local [employer]   ADVANCED DIRECTIVES: The patient intends to name her grandson Alisha [name] as her healthcare power of attorney.  He can be reached at [phone].  She was given the appropriate documents to complete and notarize at her discretion at the time of her 07/21/2019 visit   HEALTH MAINTENANCE: Social History   Tobacco Use   Smoking status: Never   Smokeless tobacco: Never  Vaping Use   Vaping status: Never Used  Substance Use Topics   Alcohol  use: No   Drug use: No     Allergies  Allergen Reactions   Amlodipine  Other (See Comments)    Other reaction(s): swelling, ha, itching   Benicar  [Olmesartan  Medoxomil] Other (See Comments)    Unknown   Doxepin Hcl     Other reaction(s): Body swelling   Estradiol     Other reaction(s): itching   Gabapentin      Other reaction(s): swelling   Levofloxacin     Other reaction(s): Unknown   Lisinopril Itching    No energy Other reaction(s): itching, hair loss   Nebivolol Hcl     Other reaction(s): fatigue   Olmesartan      Other reaction(s): hair loss, nail changes   Tramadol Hcl     Other reaction(s): abdominal pain   Penicillins Itching and Rash    Has patient had a PCN reaction causing immediate rash, facial/tongue/throat swelling, SOB or lightheadedness with hypotension: No Has patient had a PCN reaction causing severe rash involving mucus membranes or skin necrosis: No Has patient had a PCN reaction that required hospitalization: No Has patient had a PCN reaction occurring within the last 10 years: No If all of the above answers are NO, then may proceed with Cephalosporin use.   Sulfa Antibiotics Itching and Rash    Other reaction(s): Unknown    Current Outpatient Medications   Medication Sig Dispense Refill   amLODipine  (NORVASC ) 10 MG tablet TAKE ONE TABLET BY MOUTH AT NOON Needs appointment for further refills 90 tablet 1   Artificial Tear Solution (SOOTHE XP OP) Place 2 drops into both eyes 2 (two) times daily.     cetaphil (CETAPHIL) cream Apply 1 application  topically daily as needed (dry skin).     chlorthalidone  (HYGROTON ) 25 MG tablet Take 1 tablet (25 mg total) by mouth daily. TAKE ONE TABLET BY MOUTH AT NOON 90 tablet 2   clindamycin  (CLEOCIN ) 300 MG capsule Take 600 mg by mouth See admin instructions. Take before dental appointments     fluticasone  (FLONASE ) 50 MCG/ACT nasal spray Place 2 sprays into both nostrils daily as needed for allergies.     furosemide  (LASIX ) 20 MG tablet Take 20 mg by mouth daily as needed for fluid or edema.     levothyroxine  (SYNTHROID , LEVOTHROID) 88 MCG tablet Take 88 mcg by mouth daily before breakfast.      meclizine  (ANTIVERT ) 12.5 MG tablet Take 12.5 mg by mouth 3 (three) times daily as needed for dizziness.     Multiple Vitamins-Minerals (PRESERVISION AREDS 2+MULTI VIT) CAPS Take 1 capsule by mouth in the morning and at bedtime.     potassium chloride  SA (KLOR-CON ) 20 MEQ tablet Take 20 mEq by mouth daily as needed (when taking furosemide ).     Probiotic Product (ALIGN) 4 MG CAPS Take 2 capsules by mouth daily. gummy     valsartan  (DIOVAN ) 320 MG tablet Take 1 tablet (320 mg total) by mouth daily at 12 noon. Will need an appointment to for future refill 30 tablet 0   No current facility-administered medications for this visit.    OBJECTIVE: African-American woman who appears stated age  Vitals:   04/11/24 1231 04/11/24 1232  BP: (!) 157/48 (!) 149/42  Pulse: (!) 54   Resp: 19   Temp: 98.3 F (36.8 C)   SpO2: 100%       Body mass index is 33.01 kg/m.   Wt Readings from Last 3 Encounters:  04/11/24 195 lb 4.8 oz (88.6 kg)  02/09/24 198 lb 6 oz (90 kg)  10/26/23 197 lb 9.6 oz (89.6 kg)      ECOG FS:2 -  Symptomatic, <50% confined to bed  Physical Exam Constitutional:      Appearance: Normal appearance.  Chest:     Comments: Right breast normal to inspection and palpation. Left breast s/p mastectomy. No palpable changes No lymphadenopathy  Musculoskeletal:     Cervical back: Normal range of motion and neck supple. No rigidity.  Lymphadenopathy:     Cervical: No cervical adenopathy.   Neurological:     Mental Status: She is alert.      LAB RESULTS:  CMP     Component Value Date/Time   NA 140 09/01/2022 1319   NA 141 01/24/2021 1627   K 3.6 09/01/2022 1319   CL 105 09/01/2022 1319   CO2 27 09/01/2022 1319   GLUCOSE 86 09/01/2022 1319   BUN 21 09/01/2022 1319   BUN 25 01/24/2021 1627   CREATININE 0.88 09/01/2022 1319   CREATININE 0.78 07/21/2019 1525   CALCIUM 9.2 09/01/2022 1319   PROT 7.0 09/01/2022 1319   ALBUMIN 4.5 09/01/2022 1319   AST 16 09/01/2022 1319   AST 15 07/21/2019 1525   ALT 10 09/01/2022 1319   ALT 19 07/21/2019 1525   ALKPHOS 61 09/01/2022 1319   BILITOT 0.3 09/01/2022 1319   BILITOT 0.2 (L) 07/21/2019 1525   GFRNONAA >60 09/01/2022 1319   GFRNONAA >60 07/21/2019 1525   GFRAA 76 12/13/2020 1035   GFRAA >60 07/21/2019 1525    No results found for: TOTALPROTELP, ALBUMINELP, A1GS, A2GS, BETS, BETA2SER, GAMS, MSPIKE, SPEI  No results found for: KPAFRELGTCHN, LAMBDASER, KAPLAMBRATIO  Lab  Results  Component Value Date   WBC 8.3 09/01/2022   NEUTROABS 4.8 09/01/2022   HGB 10.7 (L) 09/01/2022   HCT 32.1 (L) 09/01/2022   MCV 93.9 09/01/2022   PLT 282 09/01/2022   No results found for: LABCA2  No components found for: [id number]  No results for input(s): INR in the last 168 hours.  No results found for: LABCA2  No results found for: CAN199  No results found for: CAN125  No results found for: RJW846  Lab Results  Component Value Date   CA2729 15.8 12/20/2020    No components found for:  HGQUANT  Lab Results  Component Value Date   CEA1 1.24 12/20/2020   /  CEA ([facility])  Date Value Ref Range Status  12/20/2020 1.24 0.00 - 5.00 ng/mL Final    Comment:    (NOTE) This test was performed using Architect's Chemiluminescent Microparticle Immunoassay. Values obtained from different assay methods cannot be used interchangeably. Please note that 5-10% of patients who smoke may see CEA levels up to 6.9 ng/mL. Performed at [facility], [address] [place], [place] [address]      No results found for: AFPTUMOR  No results found for: CHROMOGRNA  No results found for: HGBA, HGBA2QUANT, HGBFQUANT, HGBSQUAN (Hemoglobinopathy evaluation)   No results found for: LDH  Lab Results  Component Value Date   IRON 62 04/14/2022   TIBC 256 04/14/2022   IRONPCTSAT 24 04/14/2022   (Iron and TIBC)  Lab Results  Component Value Date   FERRITIN 204 04/14/2022    Urinalysis    Component Value Date/Time   COLORURINE YELLOW 07/02/2018 1752   APPEARANCEUR CLEAR 07/02/2018 1752   LABSPEC 1.021 07/02/2018 1752   PHURINE 5.0 07/02/2018 1752   GLUCOSEU NEGATIVE 07/02/2018 1752   HGBUR NEGATIVE 07/02/2018 1752   BILIRUBINUR NEGATIVE 07/02/2018 1752   KETONESUR 20 (A) 07/02/2018 1752   PROTEINUR NEGATIVE 07/02/2018 1752   UROBILINOGEN 1.0 02/20/2012 0927   NITRITE NEGATIVE 07/02/2018 1752   LEUKOCYTESUR NEGATIVE 07/02/2018 1752    STUDIES: No results found.   ELIGIBLE FOR AVAILABLE RESEARCH PROTOCOL: no  ASSESSMENT:   89 y.o. [employer], [place] with recently diagnosed left breast upper outer quadrant invasive ductal carcinoma, triple negative who is here for follow-up.  Please refer to oncological history for complete history.  Her last breast cancer back in 2020 was in the left breast at 12 o'clock position again IDC triple negative.  At that time she had surgery, adjuvant radiation, no adjuvant chemotherapy.  Since then  she also had lung cancer which was treated with SBRT with excellent response.  She then had an abnormal mammogram of the left breast which showed a 1 cm tumor, triple negative on the initial biopsy, grade 2.  Discussion was to consider upfront surgery given her age and she is now status post left mastectomy.  Final pathology showed 2 cm, grade 2 tumor, prognostics not repeated, initial prognostics ER 0%, PR 0% and HER2 negative. Given triple negative pathology and tumor greater than 5 mm, we have discussed about adjuvant docetaxel and cyclophosphamide every 21 days for 4 cycles especially since she has robust performance status. We have discussed about adverse effects of chemotherapy including but not limited to fatigue, nausea, vomiting, diarrhea, increased risk of cytopenias, neuropathy, infections etc.  She declined adjuvant chemotherapy. Since last visit she had a PET imaging which showed 8 mm RUL nodule, borderline in size for PET resolution, no other major  concerns. Sheis currently scheduled for navigational bronchoscopy by Dr Shelah on 11.20. This showed NSCLC, completed SBRT  Assessment and Plan Assessment & Plan Triple negative breast cancer No evidence of disease. Right breast pain intermittent, no palpable masses. Ok to continue monitoring unless symptoms get worse. - Order right breast mammogram in December. - Advise to report any new symptoms or changes. - Instruct to ask Dr. Izell for a breast exam during October visit.  Lung cancer April CT scan showed well-managed lung nodule with no new nodules. - Schedule CT scan without contrast in October. - Follow up with Dr. Izell in October.  Follow-up Care coordinated with Dr. Izell and future appointments scheduled. - Follow up with Dr. Izell in October. - Schedule follow-up appointment with hematology and oncology in June next year.   Total encounter time 30 minutes.  *Total Encounter Time as defined by the Centers for  Medicare and Medicaid Services includes, in addition to the face-to-face time of a patient visit (documented in the note above) non-face-to-face time: obtaining and reviewing outside history, ordering and reviewing medications, tests or procedures, care coordination (communications with other health care professionals or caregivers) and documentation in the medical record.

## 2024-04-18 DIAGNOSIS — E039 Hypothyroidism, unspecified: Secondary | ICD-10-CM | POA: Diagnosis not present

## 2024-04-18 DIAGNOSIS — H402231 Chronic angle-closure glaucoma, bilateral, mild stage: Secondary | ICD-10-CM | POA: Diagnosis not present

## 2024-04-18 DIAGNOSIS — C3491 Malignant neoplasm of unspecified part of right bronchus or lung: Secondary | ICD-10-CM | POA: Diagnosis not present

## 2024-04-18 DIAGNOSIS — Z853 Personal history of malignant neoplasm of breast: Secondary | ICD-10-CM | POA: Diagnosis not present

## 2024-04-18 DIAGNOSIS — E1169 Type 2 diabetes mellitus with other specified complication: Secondary | ICD-10-CM | POA: Diagnosis not present

## 2024-04-19 ENCOUNTER — Ambulatory Visit: Admitting: Podiatry

## 2024-04-20 ENCOUNTER — Encounter: Payer: Self-pay | Admitting: Cardiology

## 2024-04-20 ENCOUNTER — Ambulatory Visit: Attending: Cardiology | Admitting: Cardiology

## 2024-04-20 VITALS — BP 138/62 | HR 45 | Ht 64.5 in | Wt 195.0 lb

## 2024-04-20 DIAGNOSIS — I110 Hypertensive heart disease with heart failure: Secondary | ICD-10-CM | POA: Diagnosis not present

## 2024-04-20 DIAGNOSIS — E785 Hyperlipidemia, unspecified: Secondary | ICD-10-CM

## 2024-04-20 DIAGNOSIS — I5032 Chronic diastolic (congestive) heart failure: Secondary | ICD-10-CM

## 2024-04-20 MED ORDER — VALSARTAN 320 MG PO TABS: 320.0000 mg | ORAL_TABLET | Freq: Every day | ORAL | 3 refills | Status: AC

## 2024-04-20 NOTE — Progress Notes (Signed)
 Cardiology Office Note:  .   Date:  04/24/2024  ID:  Alisha [name], DOB 26-Jun-1935, MRN [id number] PCP: Kip Righter, MD  [facility] Providers Cardiologist:  Alm Clay, MD     Chief Complaint  Patient presents with   Follow-up    Overall doing well.  Has only used 1 or 2 doses of Lasix  in the past year.  No syncope or near syncope    Patient Profile: .     Alisha [name] is a 89 y.o. female with a PMH notable for HTN with chronic HFpEF, bradycardia, hypothyroidism as well as breast and lung cancer who presents here for 29-month follow-up at the request of Kip Righter, MD.     Alisha Lyons Redder was last seen on October 01, 2023 by Damien Braver, NP: Gillis Lips.  No chest pain or pressure with rest exertion.  No palpitations.  No significant fatigue.  No PND, orthopnea.  Her great-grandson had just died at age 60 so she was upset.  Back in 08-23-2024her sister just died.= Felt to be euvolemic on exam.  Remained on valsartan , chlorthalidone  and as needed Lasix .>  Has allergy listed to an ARB but has been on valsartan  for some time now.  No beta-blocker because of bradycardia and LVEDP.  Subjective  Discussed the use of AI scribe software for clinical note transcription with the patient, who gave verbal consent to proceed.  History of Present Illness History of Present Illness Alisha [name] is an 89 year old female with hypertension who presents for medication management and evaluation of dizziness. She was referred by Dr. Kip for blood pressure monitoring and management.  Her blood pressure medications were adjusted in December, and she is currently taking valsartan  320 mg and chlorthalidone  25 mg. She also uses a diuretic as needed, approximately once or twice a year, for ankle swelling. She monitors her blood pressure at home, primarily in the evening, and maintains a chart of her readings.  She experiences occasional dizziness,  particularly when transitioning from sitting to standing, which has been occurring for about a month. She feels lightheaded upon standing but has not experienced any falls or syncope. She manages these symptoms by standing up slowly.  She reports extreme tiredness at times, stating 'I'm just so tired I can't even walk down my hall.' She is currently taking vitamin D3, which was started at her cancer care center.  No shortness of breath when lying flat or waking up short of breath. No palpitations or stroke-like symptoms.  Cardiovascular ROS: no chest pain or dyspnea on exertion positive for - chronic fatigue - a bit better; occasionally has lightheadedness- mostly with standing negative for - irregular heartbeat, orthopnea, palpitations, paroxysmal nocturnal dyspnea, rapid heart rate, shortness of breath, or syncope/near syncope, TIA/CVA/amaurosis fugax; claudication  ROS:  Review of Systems - Negative except noted above    Objective   Medications - Valsartan  320 mg - Chlorthalidone  25 mg;  - Furosemide  20 mg tablet maybe once or twice in the past year.  (Klor-Con  20 mg PRN use of Lasix ) - Vitamin D3 - As needed albuterol  - Synthroid  88 mcg daily - Meclizine  12.5 mg 3 times daily as needed  Studies Reviewed: .        Results LABS Total cholesterol: 201 mg/dL (92/7975) HDL: 61 mg/dL (92/7975) LDL: 873 mg/dL (92/7975) Triglycerides: 80 mg/dL (92/7975) J8r 5.6 (8/76/7974)  No recent CV studies  Risk Assessment/Calculations:  Physical Exam:   VS:  BP 138/62   Pulse (!) 45   Ht 5' 4.5 (1.638 m)   Wt 195 lb (88.5 kg)   SpO2 98%   BMI 32.95 kg/m    Wt Readings from Last 3 Encounters:  04/20/24 195 lb (88.5 kg)  04/11/24 195 lb 4.8 oz (88.6 kg)  02/09/24 198 lb 6 oz (90 kg)    GEN: Well nourished, well groomed in no acute distress; mildly obese but healthy-appearing NECK: No JVD; No carotid bruits CARDIAC: Bradycardic with regular rhythm; Normal S1, S2; harsh  1/6 SEM at RUSB.  Otherwise no rubs, gallops RESPIRATORY:  Clear to auscultation without rales, wheezing or rhonchi ; nonlabored, good air movement. ABDOMEN: Soft, non-tender, non-distended EXTREMITIES:  No edema; No deformity; difficulty palpate pedal pulses.     ASSESSMENT AND PLAN: .    Problem List Items Addressed This Visit       Cardiology Problems   Hyperlipidemia with target LDL less than 100   Hyperlipidemia Last cholesterol check showed LDL of 126 mg/dL.  Not on lipid-lowering therapy. Discussed limited benefit of aggressive lipid management at her age. - Discuss potential need for lipid-lowering therapy with primary care provider if LDL remains elevated. => Could consider Nexlizet; will attempt low-dose rosuvastatin.  Last A1c was 5.6.  She is not currently on any medications for diabetes.      Relevant Medications   valsartan  (DIOVAN ) 320 MG tablet   Hypertensive heart disease with chronic diastolic congestive heart failure (HCC) - Primary (Chronic)   On valsartan  and chlorthalidone . Occasional dizziness likely due to orthostatic hypotension. Conservative blood pressure target <150 mmHg due to age. Emphasized hydration and cautious standing to prevent dizziness. - Continue valsartan  320 mg daily. - Continue chlorthalidone  25 mg daily. - Monitor blood pressure at different times of the day and record results. - Encourage hydration, especially in the morning and during hot weather. - Advise standing up slowly to prevent dizziness.      Relevant Medications   valsartan  (DIOVAN ) 320 MG tablet           Follow-Up: Return in about 1 year (around 04/20/2025) for Routine follow up with me, [employer].    Signed, Alm MICAEL Clay, MD, MS Alm Clay, M.D., M.S. Interventional Chartered certified accountant  Pager # [phone]

## 2024-04-20 NOTE — Patient Instructions (Signed)
 Medication Instructions:   No changes *If you need a refill on your cardiac medications before your next appointment, please call your pharmacy*   Lab Work: Not needed    Testing/Procedures:  Not needed  Follow-Up: At [facility], you and your health needs are our priority.  As part of our continuing mission to provide you with exceptional heart care, we have created designated Provider Care Teams.  These Care Teams include your primary Cardiologist (physician) and Advanced Practice Providers (APPs -  Physician Assistants and Nurse Practitioners) who all work together to provide you with the care you need, when you need it.     Your next appointment:   12 month(s)  The format for your next appointment:   In Person  Provider:   Damien Braver, NP      Then, Alm Clay, MD will plan to see you again in 24 month(s).   Other Instructions

## 2024-04-24 ENCOUNTER — Encounter: Payer: Self-pay | Admitting: Cardiology

## 2024-04-24 NOTE — Assessment & Plan Note (Signed)
 Hyperlipidemia Last cholesterol check showed LDL of 126 mg/dL.  Not on lipid-lowering therapy. Discussed limited benefit of aggressive lipid management at her age. - Discuss potential need for lipid-lowering therapy with primary care provider if LDL remains elevated. => Could consider Nexlizet; will attempt low-dose rosuvastatin.  Last A1c was 5.6.  She is not currently on any medications for diabetes.

## 2024-04-24 NOTE — Assessment & Plan Note (Signed)
 On valsartan  and chlorthalidone . Occasional dizziness likely due to orthostatic hypotension. Conservative blood pressure target <150 mmHg due to age. Emphasized hydration and cautious standing to prevent dizziness. - Continue valsartan  320 mg daily. - Continue chlorthalidone  25 mg daily. - Monitor blood pressure at different times of the day and record results. - Encourage hydration, especially in the morning and during hot weather. - Advise standing up slowly to prevent dizziness.

## 2024-04-24 NOTE — Assessment & Plan Note (Signed)
 Assessment and Plan Hypertension On valsartan  and chlorthalidone . Occasional dizziness likely due to orthostatic hypotension. Conservative blood pressure target <150 mmHg due to age. Emphasized hydration and cautious standing to prevent dizziness. - Continue valsartan  320 mg daily. - Continue chlorthalidone  25 mg daily. - Monitor blood pressure at different times of the day and record results. - Encourage hydration, especially in the morning and during hot weather. - Advise standing up slowly to prevent dizziness.  Hyperlipidemia Last cholesterol check showed LDL of 126 mg/dL. Not on lipid-lowering therapy. Discussed limited benefit of aggressive lipid management at her age. - Discuss potential need for lipid-lowering therapy with primary care provider if LDL remains elevated.  Vitamin D  deficiency Previously diagnosed and on vitamin D3 supplements as prescribed by oncologist. - Continue vitamin D3 supplementation as prescribed.  Recording duration: 14 minutes

## 2024-04-25 ENCOUNTER — Ambulatory Visit: Payer: Medicare HMO | Admitting: Hematology and Oncology

## 2024-05-03 ENCOUNTER — Encounter: Payer: Self-pay | Admitting: Podiatry

## 2024-05-03 ENCOUNTER — Ambulatory Visit: Admitting: Podiatry

## 2024-05-03 DIAGNOSIS — M79672 Pain in left foot: Secondary | ICD-10-CM

## 2024-05-03 DIAGNOSIS — B351 Tinea unguium: Secondary | ICD-10-CM

## 2024-05-03 DIAGNOSIS — M79671 Pain in right foot: Secondary | ICD-10-CM

## 2024-05-03 NOTE — Progress Notes (Signed)
 Patient presents for evaluation and treatment of tenderness and some redness around nails feet.  Tenderness around toes with walking and wearing shoes.  Physical exam:  General appearance: Alert, pleasant, and in no acute distress.  Vascular: Pedal pulses: DP 2/4 B/L, PT 0/4 B/L. Mild edema lower legs bilaterally  Neurological:    Dermatologic:  Nails thickened, disfigured, discolored 1-5 BL with subungual debris.  Redness and hypertrophic nail folds along nail folds bilaterally but no signs of drainage or infection.  Musculoskeletal:     Diagnosis: 1. Painful onychomycotic nails 1 through 5 bilaterally. 2. Pain toes 1 through 5 bilaterally.  Plan: Debrided onychomycotic nails 1 through 5 bilaterally.  Return 3 months RFC

## 2024-05-10 DIAGNOSIS — E1169 Type 2 diabetes mellitus with other specified complication: Secondary | ICD-10-CM | POA: Diagnosis not present

## 2024-05-10 DIAGNOSIS — I10 Essential (primary) hypertension: Secondary | ICD-10-CM | POA: Diagnosis not present

## 2024-05-10 DIAGNOSIS — I5032 Chronic diastolic (congestive) heart failure: Secondary | ICD-10-CM | POA: Diagnosis not present

## 2024-05-19 DIAGNOSIS — E039 Hypothyroidism, unspecified: Secondary | ICD-10-CM | POA: Diagnosis not present

## 2024-05-19 DIAGNOSIS — C3491 Malignant neoplasm of unspecified part of right bronchus or lung: Secondary | ICD-10-CM | POA: Diagnosis not present

## 2024-05-19 DIAGNOSIS — I10 Essential (primary) hypertension: Secondary | ICD-10-CM | POA: Diagnosis not present

## 2024-05-19 DIAGNOSIS — E1169 Type 2 diabetes mellitus with other specified complication: Secondary | ICD-10-CM | POA: Diagnosis not present

## 2024-05-19 DIAGNOSIS — Z853 Personal history of malignant neoplasm of breast: Secondary | ICD-10-CM | POA: Diagnosis not present

## 2024-05-19 DIAGNOSIS — I5032 Chronic diastolic (congestive) heart failure: Secondary | ICD-10-CM | POA: Diagnosis not present

## 2024-05-20 DIAGNOSIS — Z0000 Encounter for general adult medical examination without abnormal findings: Secondary | ICD-10-CM | POA: Diagnosis not present

## 2024-05-20 DIAGNOSIS — C3491 Malignant neoplasm of unspecified part of right bronchus or lung: Secondary | ICD-10-CM | POA: Diagnosis not present

## 2024-05-20 DIAGNOSIS — Z853 Personal history of malignant neoplasm of breast: Secondary | ICD-10-CM | POA: Diagnosis not present

## 2024-05-20 DIAGNOSIS — I10 Essential (primary) hypertension: Secondary | ICD-10-CM | POA: Diagnosis not present

## 2024-05-20 DIAGNOSIS — E1169 Type 2 diabetes mellitus with other specified complication: Secondary | ICD-10-CM | POA: Diagnosis not present

## 2024-05-20 DIAGNOSIS — E785 Hyperlipidemia, unspecified: Secondary | ICD-10-CM | POA: Diagnosis not present

## 2024-05-20 DIAGNOSIS — I5032 Chronic diastolic (congestive) heart failure: Secondary | ICD-10-CM | POA: Diagnosis not present

## 2024-05-20 DIAGNOSIS — R5383 Other fatigue: Secondary | ICD-10-CM | POA: Diagnosis not present

## 2024-05-20 DIAGNOSIS — E039 Hypothyroidism, unspecified: Secondary | ICD-10-CM | POA: Diagnosis not present

## 2024-05-20 DIAGNOSIS — E559 Vitamin D deficiency, unspecified: Secondary | ICD-10-CM | POA: Diagnosis not present

## 2024-06-09 DIAGNOSIS — I5032 Chronic diastolic (congestive) heart failure: Secondary | ICD-10-CM | POA: Diagnosis not present

## 2024-06-09 DIAGNOSIS — I10 Essential (primary) hypertension: Secondary | ICD-10-CM | POA: Diagnosis not present

## 2024-06-09 DIAGNOSIS — E1169 Type 2 diabetes mellitus with other specified complication: Secondary | ICD-10-CM | POA: Diagnosis not present

## 2024-06-19 DIAGNOSIS — Z853 Personal history of malignant neoplasm of breast: Secondary | ICD-10-CM | POA: Diagnosis not present

## 2024-06-19 DIAGNOSIS — C3491 Malignant neoplasm of unspecified part of right bronchus or lung: Secondary | ICD-10-CM | POA: Diagnosis not present

## 2024-06-19 DIAGNOSIS — I10 Essential (primary) hypertension: Secondary | ICD-10-CM | POA: Diagnosis not present

## 2024-06-19 DIAGNOSIS — I5032 Chronic diastolic (congestive) heart failure: Secondary | ICD-10-CM | POA: Diagnosis not present

## 2024-06-19 DIAGNOSIS — E1169 Type 2 diabetes mellitus with other specified complication: Secondary | ICD-10-CM | POA: Diagnosis not present

## 2024-06-19 DIAGNOSIS — E039 Hypothyroidism, unspecified: Secondary | ICD-10-CM | POA: Diagnosis not present

## 2024-07-09 DIAGNOSIS — I5032 Chronic diastolic (congestive) heart failure: Secondary | ICD-10-CM | POA: Diagnosis not present

## 2024-07-09 DIAGNOSIS — E1169 Type 2 diabetes mellitus with other specified complication: Secondary | ICD-10-CM | POA: Diagnosis not present

## 2024-07-09 DIAGNOSIS — I10 Essential (primary) hypertension: Secondary | ICD-10-CM | POA: Diagnosis not present

## 2024-07-19 DIAGNOSIS — E039 Hypothyroidism, unspecified: Secondary | ICD-10-CM | POA: Diagnosis not present

## 2024-07-19 DIAGNOSIS — I5032 Chronic diastolic (congestive) heart failure: Secondary | ICD-10-CM | POA: Diagnosis not present

## 2024-07-19 DIAGNOSIS — Z853 Personal history of malignant neoplasm of breast: Secondary | ICD-10-CM | POA: Diagnosis not present

## 2024-07-19 DIAGNOSIS — E1169 Type 2 diabetes mellitus with other specified complication: Secondary | ICD-10-CM | POA: Diagnosis not present

## 2024-07-19 DIAGNOSIS — I10 Essential (primary) hypertension: Secondary | ICD-10-CM | POA: Diagnosis not present

## 2024-07-19 DIAGNOSIS — C3491 Malignant neoplasm of unspecified part of right bronchus or lung: Secondary | ICD-10-CM | POA: Diagnosis not present

## 2024-07-26 DIAGNOSIS — J301 Allergic rhinitis due to pollen: Secondary | ICD-10-CM | POA: Diagnosis not present

## 2024-07-26 DIAGNOSIS — H3530 Unspecified macular degeneration: Secondary | ICD-10-CM | POA: Diagnosis not present

## 2024-07-26 DIAGNOSIS — M1990 Unspecified osteoarthritis, unspecified site: Secondary | ICD-10-CM | POA: Diagnosis not present

## 2024-07-26 DIAGNOSIS — G629 Polyneuropathy, unspecified: Secondary | ICD-10-CM | POA: Diagnosis not present

## 2024-07-26 DIAGNOSIS — E039 Hypothyroidism, unspecified: Secondary | ICD-10-CM | POA: Diagnosis not present

## 2024-07-26 DIAGNOSIS — H409 Unspecified glaucoma: Secondary | ICD-10-CM | POA: Diagnosis not present

## 2024-07-26 DIAGNOSIS — E876 Hypokalemia: Secondary | ICD-10-CM | POA: Diagnosis not present

## 2024-07-26 DIAGNOSIS — Z85118 Personal history of other malignant neoplasm of bronchus and lung: Secondary | ICD-10-CM | POA: Diagnosis not present

## 2024-07-26 DIAGNOSIS — I10 Essential (primary) hypertension: Secondary | ICD-10-CM | POA: Diagnosis not present

## 2024-07-26 DIAGNOSIS — R2681 Unsteadiness on feet: Secondary | ICD-10-CM | POA: Diagnosis not present

## 2024-08-01 ENCOUNTER — Telehealth: Payer: Self-pay | Admitting: *Deleted

## 2024-08-01 NOTE — Telephone Encounter (Signed)
 Called patient to inform of CT for 08-10-24- arrival time- 1:45 pm @ [facility], no restrictions to scan, patient to receive results from PA  [facility] on 08-16-24 @ 10 am, lvm for a return call

## 2024-08-08 DIAGNOSIS — E1169 Type 2 diabetes mellitus with other specified complication: Secondary | ICD-10-CM | POA: Diagnosis not present

## 2024-08-08 DIAGNOSIS — I10 Essential (primary) hypertension: Secondary | ICD-10-CM | POA: Diagnosis not present

## 2024-08-08 DIAGNOSIS — I5032 Chronic diastolic (congestive) heart failure: Secondary | ICD-10-CM | POA: Diagnosis not present

## 2024-08-10 ENCOUNTER — Ambulatory Visit (HOSPITAL_COMMUNITY)
Admission: RE | Admit: 2024-08-10 | Discharge: 2024-08-10 | Disposition: A | Discharge status: Home / Self Care | Source: Ambulatory Visit | Attending: Radiology | Admitting: Radiology

## 2024-08-10 DIAGNOSIS — C3411 Malignant neoplasm of upper lobe, right bronchus or lung: Secondary | ICD-10-CM | POA: Insufficient documentation

## 2024-08-10 DIAGNOSIS — K449 Diaphragmatic hernia without obstruction or gangrene: Secondary | ICD-10-CM | POA: Diagnosis not present

## 2024-08-10 DIAGNOSIS — I700 Atherosclerosis of aorta: Secondary | ICD-10-CM | POA: Diagnosis not present

## 2024-08-15 NOTE — Progress Notes (Signed)
   Ms. Berdan presents today to see Leeroy Due PA-C for follow up for completion of radiation for breast cancer and to receive CT scan results from 08/13/2024.         Breast Side: Left Breast   They completed their radiation on:   Does the patient complain of any of the following: Post radiation skin issues: Denies Breast Tenderness: Denies Breast Swelling: Denies Lymphadema: Denies Range of Motion limitations: Denies Fatigue post radiation: Denies Appetite good/fair/poor: Denies  Additional comments if applicable: Temp (!) (P) 96.6 F (35.9 C) (Temporal)   Resp (P) 18   Ht (P) 5' 4.5 (1.638 m)   Wt (P) 187 lb 4 oz (84.9 kg)   SpO2 (P) 100%   BMI (P) 31.65 kg/m

## 2024-08-16 ENCOUNTER — Encounter: Payer: Self-pay | Admitting: Podiatry

## 2024-08-16 ENCOUNTER — Ambulatory Visit
Admission: RE | Admit: 2024-08-16 | Discharge: 2024-08-16 | Disposition: A | Discharge status: Home / Self Care | Payer: Self-pay | Source: Ambulatory Visit | Attending: Radiology | Admitting: Radiology

## 2024-08-16 ENCOUNTER — Encounter: Payer: Self-pay | Admitting: Radiology

## 2024-08-16 ENCOUNTER — Ambulatory Visit: Admitting: Podiatry

## 2024-08-16 DIAGNOSIS — I10 Essential (primary) hypertension: Secondary | ICD-10-CM | POA: Diagnosis not present

## 2024-08-16 DIAGNOSIS — B351 Tinea unguium: Secondary | ICD-10-CM

## 2024-08-16 DIAGNOSIS — M79675 Pain in left toe(s): Secondary | ICD-10-CM

## 2024-08-16 DIAGNOSIS — C3411 Malignant neoplasm of upper lobe, right bronchus or lung: Secondary | ICD-10-CM | POA: Insufficient documentation

## 2024-08-16 DIAGNOSIS — C50412 Malignant neoplasm of upper-outer quadrant of left female breast: Secondary | ICD-10-CM | POA: Diagnosis not present

## 2024-08-16 DIAGNOSIS — M79674 Pain in right toe(s): Secondary | ICD-10-CM

## 2024-08-16 DIAGNOSIS — Z79890 Hormone replacement therapy (postmenopausal): Secondary | ICD-10-CM | POA: Insufficient documentation

## 2024-08-16 DIAGNOSIS — Z171 Estrogen receptor negative status [ER-]: Secondary | ICD-10-CM | POA: Diagnosis not present

## 2024-08-16 DIAGNOSIS — G629 Polyneuropathy, unspecified: Secondary | ICD-10-CM

## 2024-08-16 DIAGNOSIS — Z79899 Other long term (current) drug therapy: Secondary | ICD-10-CM | POA: Insufficient documentation

## 2024-08-16 DIAGNOSIS — Z923 Personal history of irradiation: Secondary | ICD-10-CM | POA: Insufficient documentation

## 2024-08-16 NOTE — Addendum Note (Signed)
 Encounter addended by: Wyatt Leeroy HERO, PA-C on: 08/16/2024 2:13 PM  Actions taken: Clinical Note Signed

## 2024-08-16 NOTE — Progress Notes (Addendum)
 Radiation Oncology         ([phone] ________________________________  Name: Alisha [name] MRN: [id number]  Date: 08/16/2024  DOB: [birth year]  Follow-Up Visit Note  CC: Kip Righter, MD  Kip Righter, MD  Diagnosis and Prior Radiotherapy:       ICD-10-CM   1. Malignant neoplasm of upper-outer quadrant of left breast in female, estrogen receptor negative (HCC)  C50.412 CT CHEST WO CONTRAST   Z17.1         Cancer Staging  Cancer of middle lobe of lung (HCC) Staging form: Lung, AJCC 8th Edition - Clinical stage from 10/07/2022: Stage IA1 (cT1a, cN0, cM0) - Signed by Izell Domino, MD on 10/07/2022 Stage prefix: Initial diagnosis  Malignant neoplasm of upper lobe of right lung [facility]) Staging form: Lung, AJCC 8th Edition - Clinical stage from 01/23/2021: cT1, cN0, cM0 - Signed by Izell Domino, MD on 01/23/2021 Stage prefix: Initial diagnosis Laterality: Right  Malignant neoplasm of upper-inner quadrant of left breast in female, estrogen receptor negative (HCC) Staging form: Breast, AJCC 8th Edition - Clinical stage from 10/29/2021: Stage IB (cT1c, cN0, cM0, G2, ER-, PR-, HER2-) - Signed by Crawford Morna Pickle, NP on 11/06/2021 Stage prefix: Initial diagnosis Histologic grading system: 3 grade system  Malignant neoplasm of upper-outer quadrant of left breast in female, estrogen receptor negative (HCC) Staging form: Breast, AJCC 8th Edition - Clinical stage from 06/22/2019: Stage IB (cT1b, cN0, cM0, G3, ER-, PR-, HER2-) - Signed by Crawford Morna Pickle, NP on 06/29/2019 Stage prefix: Initial diagnosis Histologic grading system: 3 grade system - Pathologic: Stage IB (pT1b, pN0, cM0, G3, ER-, PR-, HER2-) - Signed by Crawford Morna Pickle, NP on 08/26/2020 Stage prefix: Initial diagnosis Histologic grading system: 3 grade system    CHIEF COMPLAINT:  Here for follow-up and surveillance of lung cancer and to receive CT scan results from 01/26/23.   First Treatment  Date: 2022-10-21 - Last Treatment Date: 2022-10-31 Plan Name: Lung_RUL_SBRT Site: Lung, Right Technique: SBRT/SRT-IMRT Mode: Photon Dose Per Fraction: 11 Gy Prescribed Dose (Delivered / Prescribed): 55 Gy / 55 Gy Prescribed Fxs (Delivered / Prescribed): 5 / 5   Plan Name: Lung_RML_SBRT Site: Lung, Right Technique: SBRT/SRT-IMRT Mode: Photon Dose Per Fraction: 11 Gy Prescribed Dose (Delivered / Prescribed): 55 Gy / 55 Gy Prescribed Fxs (Delivered / Prescribed): 5 / 5   Radiation Treatment Dates: 02/13/2021 through 02/20/2021 Site Technique Total Dose (Gy) Dose per Fx (Gy) Completed Fx Beam Energies  Lung, Right: Lung_Rt_RUL IMRT 54/54 18 3/3 6XFFF    Radiation Treatment Dates: 09/07/2019 through 10/06/2019 Site Technique Total Dose (Gy) Dose per Fx (Gy) Completed Fx Beam Energies  Breast, Left: Breast_Lt 3D 40.05/40.05 2.67 15/15 6X, 10X  Breast, Left: Breast_Lt_Bst specialPort 10/10 2 5/5 15E    Narrative:  The patient returns today for routine follow-up and to review most recent imaging.   CT of the chest on 08/10/2024 demonstrated stable disease with no evidence of recurrence or metastasis.  Patient states to be doing well overall today. She has been able to start exercising and denies any changes to her breathing since we last saw her. She denies any shortness of breath, cough, or hemoptysis. She denies any breast pain, nipple discharge, or skin changes to her breast.   ALLERGIES:  is allergic to amlodipine , benicar  [olmesartan  medoxomil], doxepin hcl, estradiol, gabapentin , levofloxacin, lisinopril, nebivolol hcl, olmesartan , tramadol hcl, penicillins, and sulfa antibiotics.  Meds: Current Outpatient Medications  Medication Sig Dispense Refill   albuterol  (VENTOLIN  HFA) 108 (  90 Base) MCG/ACT inhaler Inhale 1 puff into the lungs every 6 (six) hours as needed for wheezing or shortness of breath.     Artificial Tear Solution (SOOTHE XP OP) Place 2 drops into both eyes 2 (two)  times daily.     chlorthalidone  (HYGROTON ) 25 MG tablet Take 1 tablet (25 mg total) by mouth daily. TAKE ONE TABLET BY MOUTH AT NOON 90 tablet 2   cholecalciferol  (VITAMIN D3) 25 MCG (1000 UNIT) tablet Take 1,000 Units by mouth daily.     clindamycin  (CLEOCIN ) 300 MG capsule Take 600 mg by mouth See admin instructions. Take before dental appointments     furosemide  (LASIX ) 20 MG tablet Take 20 mg by mouth daily as needed for fluid or edema.     levothyroxine  (SYNTHROID , LEVOTHROID) 88 MCG tablet Take 88 mcg by mouth daily before breakfast.      meclizine  (ANTIVERT ) 12.5 MG tablet Take 12.5 mg by mouth 3 (three) times daily as needed for dizziness.     Multiple Vitamins-Minerals (PRESERVISION AREDS 2+MULTI VIT) CAPS Take 1 capsule by mouth in the morning and at bedtime.     potassium chloride  SA (KLOR-CON ) 20 MEQ tablet Take 20 mEq by mouth daily as needed (when taking furosemide ).     Probiotic Product (ALIGN) 4 MG CAPS Take 2 capsules by mouth daily. gummy     valsartan  (DIOVAN ) 320 MG tablet Take 1 tablet (320 mg total) by mouth daily at 12 noon. 90 tablet 3   No current facility-administered medications for this encounter.    Physical Findings:  The patient is in no acute distress. Patient is alert and oriented. Wt Readings from Last 3 Encounters:  08/16/24 (P) 187 lb 4 oz (84.9 kg)  04/20/24 195 lb (88.5 kg)  04/11/24 195 lb 4.8 oz (88.6 kg)    height is 5' 4.5 (1.638 m) (pended) and weight is 187 lb 4 oz (84.9 kg) (pended). Her temporal temperature is 96.6 F (35.9 C) (abnormal, pended). Her respiration is 18 (pended) and oxygen saturation is 100% (pended). .  General: Alert and oriented, in no acute distress; breathing comfortably, good coloring HEENT: Head is normocephalic. Extraocular movements are intact.  Neck: Neck is notable for no palpable lymphadenopathy.  Heart: Regular in rate and rhythm with no murmurs, rubs, or gallops. Chest: Clear to auscultation bilaterally, with no  rhonchi, wheezes, or rales.  Left chest wall: well healed mastectomy incision with no palpable masses. No residual skin changes.  Right breast: Tenderness to deep palpation in the middle of the right breast, no associated masses, nipple changes, or abnormal skin findings. Full ROM in right shoulder.  Abdomen: Soft, nontender, nondistended, with no rigidity or guarding. Extremities: No cyanosis or edema. Lymphatics: see Neck Exam. No axillary adenopathy.  Psychiatric: Judgment and insight are intact. Affect is appropriate.   Lab Findings: Lab Results  Component Value Date   WBC 8.3 09/01/2022   HGB 10.7 (L) 09/01/2022   HCT 32.1 (L) 09/01/2022   MCV 93.9 09/01/2022   PLT 282 09/01/2022    Lab Results  Component Value Date   TSH 2.260 04/29/2022    Radiographic Findings: CT CHEST WO CONTRAST Result Date: 08/13/2024 EXAM: CT CHEST WITHOUT CONTRAST 08/10/2024 02:15:24 PM TECHNIQUE: CT of the chest was performed without the administration of intravenous contrast. Multiplanar reformatted images are provided for review. Automated exposure control, iterative reconstruction, and/or weight based adjustment of the mA/kV was utilized to reduce the radiation dose to as low as reasonably achievable.  COMPARISON: Comparison to February 02, 2024. CLINICAL HISTORY: Non-small cell lung cancer (NSCLC), monitor, Malignant neoplasm of upper lobe of right lung. FINDINGS: MEDIASTINUM: The central airways are clear. No pathologic thoracic adenopathy. Mild coronary artery calcification. Global cardiac size within normal limits. Trace pericardial effusion, stable. The central pulmonary arteries are enlarged in keeping with changes of pulmonary arterial hypertension. Mild atherosclerotic calcification within the last aorta. No aortic aneurysm. LYMPH NODES: No mediastinal, hilar or axillary lymphadenopathy. LUNGS AND PLEURA: Mass-like consolidation within the anterior right upper lobe adjacent to fiducial markers,  parenchymal scarring with volume loss, and mixed density peripheral consolidation within the posterior segment of the right upper lobe adjacent to a fiducial marker in keeping with treated disease measuring 2.0 x 2.5 cm at image 51, series 6, compatible with treated disease are all stable. 6 mm pulmonary nodule within the right lower lobe at image 68, series 6, is stable. 6 mm subsolid nodule demonstrating poor retraction involving the right major fissure within the right lower lobe at image 69, series 6, and 5 mm solid noncalcified subpleural pulmonary nodule within the right middle lobe at image 63, series 6 are stable. No new focal pulmonary nodules or infiltrates. No pneumothorax or pleural effusion. SOFT TISSUES/BONES: Osseous structures are age appropriate. No acute bone abnormality. No lytic or blastic bone lesion. Surgical changes of left mastectomy are identified. Small hiatal hernia. Visualized thyroid  is unremarkable. The esophagus is unremarkable. UPPER ABDOMEN: Limited images of the upper abdomen demonstrate no acute abnormality. IMPRESSION: 1. Stable disease. Stable post-treatment changes within the right upper lobe. 2. Additional stable pulmonary nodules within the right lung as outlined above. 3. Findings consistent with pulmonary arterial hypertension. 4. raf score: Aortic atherosclerosis (icd10-i70.0) Electronically signed by: Dorethia Molt MD 08/13/2024 01:54 AM EDT RP Workstation: [id number]    Impression/Plan:    Stage I NSCLC of the RUL and RML; s/p SBRT completed in 2022 and 2024  It was a pleasure seeing this patient again today.  We personally reviewed her most recent imaging which demonstrates stable response to treatment and no evidence of progressive or recurrent disease.  Dr. Izell recommends repeat chest CT without contrast in 6 months.  We will see the patient after this image to review the results.  The patient was encouraged to call with any issues or questions before then.    History of Stage IB (pT1b, pN0, M0) invasive ductal carcinoma; s/p adjuvant radiation 10/06/2019  Patient will continue to follow with Dr. Loretha for management of her breast cancer. She is scheduled for her next mammogram in December.   She notes some right breast tenderness to deep palpation on physical exam today, similar to her exam with Dr. Loretha in June. She denies any changes since then. Encouraged patient to share any new symptoms in the interim.   3. Hypertension  Patient presents today with elevated BP. She has not taken her BP medications today. Advised patient to take medications when she goes home and to check her BP at home to ensure it returns to a normal level.     On date of service, in total, I spent 20 minutes on this encounter. Patient was seen in person. _____________________________________   Ronita Due, PA

## 2024-08-18 DIAGNOSIS — H402231 Chronic angle-closure glaucoma, bilateral, mild stage: Secondary | ICD-10-CM | POA: Diagnosis not present

## 2024-08-19 DIAGNOSIS — E1169 Type 2 diabetes mellitus with other specified complication: Secondary | ICD-10-CM | POA: Diagnosis not present

## 2024-08-19 DIAGNOSIS — E039 Hypothyroidism, unspecified: Secondary | ICD-10-CM | POA: Diagnosis not present

## 2024-08-19 DIAGNOSIS — I10 Essential (primary) hypertension: Secondary | ICD-10-CM | POA: Diagnosis not present

## 2024-08-19 DIAGNOSIS — Z853 Personal history of malignant neoplasm of breast: Secondary | ICD-10-CM | POA: Diagnosis not present

## 2024-08-19 DIAGNOSIS — C3491 Malignant neoplasm of unspecified part of right bronchus or lung: Secondary | ICD-10-CM | POA: Diagnosis not present

## 2024-08-19 DIAGNOSIS — I5032 Chronic diastolic (congestive) heart failure: Secondary | ICD-10-CM | POA: Diagnosis not present

## 2024-08-21 NOTE — Progress Notes (Signed)
 Subjective:  Patient ID: Alisha [name], female    DOB: 02-18-1935,  MRN: [id number]  Alisha Lyons Knoche presents to clinic today for at risk foot care with history of peripheral neuropathy and painful mycotic toenails x 10 which interfere with daily activities. Pain is relieved with periodic professional debridement. Patient is undergoing radiation for lung cancer. She also informs me her sister passed away. Chief Complaint  Patient presents with   Nail Problem    Trim my nails.   New problem(s): None.   PCP is Kip Righter, MD. [place] 06/19/2024.  Allergies  Allergen Reactions   Amlodipine  Other (See Comments)    Other reaction(s): swelling, ha, itching   Benicar  [Olmesartan  Medoxomil] Other (See Comments)    Unknown   Doxepin Hcl     Other reaction(s): Body swelling   Estradiol     Other reaction(s): itching   Gabapentin      Other reaction(s): swelling   Levofloxacin     Other reaction(s): Unknown   Lisinopril Itching    No energy Other reaction(s): itching, hair loss   Nebivolol Hcl     Other reaction(s): fatigue   Olmesartan      Other reaction(s): hair loss, nail changes   Tramadol Hcl     Other reaction(s): abdominal pain   Penicillins Itching and Rash    Has patient had a PCN reaction causing immediate rash, facial/tongue/throat swelling, SOB or lightheadedness with hypotension: No Has patient had a PCN reaction causing severe rash involving mucus membranes or skin necrosis: No Has patient had a PCN reaction that required hospitalization: No Has patient had a PCN reaction occurring within the last 10 years: No If all of the above answers are NO, then may proceed with Cephalosporin use.   Sulfa Antibiotics Itching and Rash    Other reaction(s): Unknown    Review of Systems: Negative except as noted in the HPI.  Objective: No changes noted in today's physical examination. There were no vitals filed for this visit. Jury Viola Orsak is a pleasant 89  y.o. female WD, WN in NAD. AAO x 3.  Vascular Examination: Capillary refill time immediate b/l. Vascular status intact b/l with palpable pedal pulses. Pedal hair present b/l. No pain with calf compression b/l. Skin temperature gradient WNL b/l. No cyanosis or clubbing b/l. No ischemia or gangrene noted b/l.   Neurological Examination: Sensation grossly intact b/l with 10 gram monofilament. Vibratory sensation intact b/l. Pt has subjective symptoms of neuropathy.  Dermatological Examination: Pedal skin with normal turgor, texture and tone b/l.  No open wounds. No interdigital macerations.   Toenails 1-5 b/l thick, discolored, elongated with subungual debris and pain on dorsal palpation.   No hyperkeratotic nor porokeratotic lesions.  Musculoskeletal Examination: Muscle strength 5/5 to all lower extremity muscle groups bilaterally. Hammertoe(s) L hallux, left second digit, and left third digit.  Radiographs: None  Assessment/Plan: 1. Pain due to onychomycosis of toenails of both feet   2. Neuropathy     Patient was evaluated and treated. All patient's and/or POA's questions/concerns addressed on today's visit. Mycotic toenails 1-5 b/l debrided in length and girth without incident.  Continue daily foot inspections and monitor blood glucose per PCP/Endocrinologist's recommendations.Continue soft, supportive shoe gear daily. Report any pedal injuries to medical professional. Call office if there are any quesitons/concerns. -Patient/POA to call should there be question/concern in the interim.   Return in about 3 months (around 11/16/2024).  Delon LITTIE Merlin, DPM       LOCATION: [address], [place] [address]                   Office [phone]   [facility] LOCATION: [address] [place], [place] [address] Office [phone]

## 2024-09-01 DIAGNOSIS — H402231 Chronic angle-closure glaucoma, bilateral, mild stage: Secondary | ICD-10-CM | POA: Diagnosis not present

## 2024-09-05 ENCOUNTER — Ambulatory Visit: Admitting: Podiatry

## 2024-09-05 ENCOUNTER — Encounter: Payer: Self-pay | Admitting: Podiatry

## 2024-09-05 DIAGNOSIS — M7752 Other enthesopathy of left foot: Secondary | ICD-10-CM | POA: Diagnosis not present

## 2024-09-05 MED ORDER — TRIAMCINOLONE ACETONIDE 10 MG/ML IJ SUSP
10.0000 mg | Freq: Once | INTRAMUSCULAR | Status: AC
  Administered 2024-09-05: 10 mg via INTRA_ARTICULAR

## 2024-09-07 DIAGNOSIS — I5032 Chronic diastolic (congestive) heart failure: Secondary | ICD-10-CM | POA: Diagnosis not present

## 2024-09-07 DIAGNOSIS — E1169 Type 2 diabetes mellitus with other specified complication: Secondary | ICD-10-CM | POA: Diagnosis not present

## 2024-09-07 DIAGNOSIS — I10 Essential (primary) hypertension: Secondary | ICD-10-CM | POA: Diagnosis not present

## 2024-09-07 NOTE — Progress Notes (Signed)
 Subjective:   Patient ID: Alisha [name], female   DOB: 89 y.o.   MRN: [id number]   HPI Patient presents with a lot of pain on the outside of the left foot and states that its made it hard to walk   ROS      Objective:  Physical Exam  Neuro vascular status unchanged with inflammation fluid around the head of the fifth MPJ painful when pressed     Assessment:  Inflammatory capsulitis fifth MPJ left with pain     Plan:  H&P reviewed sterile prep injected the capsule of the fifth MPJ 3 mg dexamethasone  Kenalog  5 mg Liken applied sterile dressing reappoint to recheck

## 2024-09-08 ENCOUNTER — Ambulatory Visit: Admitting: Podiatry

## 2024-09-08 ENCOUNTER — Encounter: Payer: Self-pay | Admitting: Podiatry

## 2024-09-08 DIAGNOSIS — M7752 Other enthesopathy of left foot: Secondary | ICD-10-CM

## 2024-09-08 MED ORDER — TRIAMCINOLONE ACETONIDE 10 MG/ML IJ SUSP
10.0000 mg | Freq: Once | INTRAMUSCULAR | Status: AC
  Administered 2024-09-08: 10 mg via INTRA_ARTICULAR

## 2024-09-09 NOTE — Progress Notes (Signed)
 Subjective:   Patient ID: Alisha [name], female   DOB: 89 y.o.   MRN: [id number]   HPI Patient points to the left foot states where I had given the medicine is good but it seems to be hurting more around another joint and has been very hard to walk   ROS      Objective:  Physical Exam  Neurovascular status is found to be intact she is well-oriented she has a lot of pain now around the fourth MPJ left with the fifth MPJ that we have treated several days ago doing much better but having trouble with weightbearing on this area     Assessment:  Inflammatory capsulitis which appears to be more in the fourth MPJ now with the fifth MPJ improved with the patient having trouble bearing weight     Plan:  H&P would like to use a boot for her but do not see her being able to tolerate it so today I did do sterile prep I injected periarticular around the fourth MPJ and then I went ahead and I put her into a rigid bottom surgical shoe.  Reappoint as symptoms indicate

## 2024-09-18 DIAGNOSIS — E039 Hypothyroidism, unspecified: Secondary | ICD-10-CM | POA: Diagnosis not present

## 2024-09-18 DIAGNOSIS — C3491 Malignant neoplasm of unspecified part of right bronchus or lung: Secondary | ICD-10-CM | POA: Diagnosis not present

## 2024-09-18 DIAGNOSIS — I10 Essential (primary) hypertension: Secondary | ICD-10-CM | POA: Diagnosis not present

## 2024-09-18 DIAGNOSIS — Z853 Personal history of malignant neoplasm of breast: Secondary | ICD-10-CM | POA: Diagnosis not present

## 2024-09-18 DIAGNOSIS — E1169 Type 2 diabetes mellitus with other specified complication: Secondary | ICD-10-CM | POA: Diagnosis not present

## 2024-09-18 DIAGNOSIS — I5032 Chronic diastolic (congestive) heart failure: Secondary | ICD-10-CM | POA: Diagnosis not present

## 2024-09-28 ENCOUNTER — Ambulatory Visit
Admission: RE | Admit: 2024-09-28 | Discharge: 2024-09-28 | Disposition: A | Discharge status: Home / Self Care | Source: Ambulatory Visit | Attending: Hematology and Oncology | Admitting: Hematology and Oncology

## 2024-09-28 DIAGNOSIS — Z171 Estrogen receptor negative status [ER-]: Secondary | ICD-10-CM

## 2024-11-30 ENCOUNTER — Ambulatory Visit: Admitting: Podiatry

## 2025-02-21 ENCOUNTER — Ambulatory Visit: Admitting: Radiology

## 2025-04-11 ENCOUNTER — Ambulatory Visit: Admitting: Hematology and Oncology
